# Patient Record
Sex: Female | Born: 1955 | Race: White | Hispanic: No | Smoking: Former smoker
Health system: Southern US, Community
[De-identification: ages and names within clinical notes are randomized; demographics above are authoritative.]

## PROBLEM LIST (undated history)

## (undated) DIAGNOSIS — I10 Essential (primary) hypertension: Secondary | ICD-10-CM

## (undated) DIAGNOSIS — K529 Noninfective gastroenteritis and colitis, unspecified: Secondary | ICD-10-CM

## (undated) DIAGNOSIS — E785 Hyperlipidemia, unspecified: Secondary | ICD-10-CM

## (undated) DIAGNOSIS — I639 Cerebral infarction, unspecified: Secondary | ICD-10-CM

## (undated) DIAGNOSIS — E079 Disorder of thyroid, unspecified: Secondary | ICD-10-CM

## (undated) HISTORY — DX: Disorder of thyroid, unspecified: E07.9

## (undated) HISTORY — DX: Essential (primary) hypertension: I10

## (undated) HISTORY — DX: Hyperlipidemia, unspecified: E78.5

## (undated) HISTORY — DX: Noninfective gastroenteritis and colitis, unspecified: K52.9

---

## 2002-05-23 ENCOUNTER — Other Ambulatory Visit: Admission: RE | Admit: 2002-05-23 | Discharge: 2002-05-23 | Payer: Self-pay | Admitting: Family Medicine

## 2004-03-25 ENCOUNTER — Other Ambulatory Visit: Admission: RE | Admit: 2004-03-25 | Discharge: 2004-03-25 | Payer: Self-pay | Admitting: Obstetrics and Gynecology

## 2005-03-25 ENCOUNTER — Other Ambulatory Visit: Admission: RE | Admit: 2005-03-25 | Discharge: 2005-03-25 | Payer: Self-pay | Admitting: Obstetrics and Gynecology

## 2006-04-08 ENCOUNTER — Other Ambulatory Visit: Admission: RE | Admit: 2006-04-08 | Discharge: 2006-04-08 | Payer: Self-pay | Admitting: Obstetrics and Gynecology

## 2006-05-03 ENCOUNTER — Encounter (INDEPENDENT_AMBULATORY_CARE_PROVIDER_SITE_OTHER): Payer: Self-pay | Admitting: *Deleted

## 2006-05-03 ENCOUNTER — Ambulatory Visit (HOSPITAL_BASED_OUTPATIENT_CLINIC_OR_DEPARTMENT_OTHER): Admission: RE | Admit: 2006-05-03 | Discharge: 2006-05-03 | Payer: Self-pay | Admitting: Obstetrics and Gynecology

## 2007-04-18 ENCOUNTER — Other Ambulatory Visit: Admission: RE | Admit: 2007-04-18 | Discharge: 2007-04-18 | Payer: Self-pay | Admitting: Obstetrics and Gynecology

## 2007-10-26 ENCOUNTER — Ambulatory Visit: Payer: Self-pay | Admitting: Obstetrics and Gynecology

## 2008-06-20 ENCOUNTER — Encounter: Payer: Self-pay | Admitting: Obstetrics and Gynecology

## 2008-06-20 ENCOUNTER — Other Ambulatory Visit: Admission: RE | Admit: 2008-06-20 | Discharge: 2008-06-20 | Payer: Self-pay | Admitting: Obstetrics and Gynecology

## 2008-06-20 ENCOUNTER — Ambulatory Visit: Payer: Self-pay | Admitting: Obstetrics and Gynecology

## 2009-06-25 ENCOUNTER — Ambulatory Visit: Payer: Self-pay | Admitting: Obstetrics and Gynecology

## 2009-06-25 ENCOUNTER — Other Ambulatory Visit: Admission: RE | Admit: 2009-06-25 | Discharge: 2009-06-25 | Payer: Self-pay | Admitting: Obstetrics and Gynecology

## 2010-06-20 NOTE — Op Note (Signed)
NAMEJAHZARIA, VARY             ACCOUNT NO.:  [id number]   MEDICAL RECORD NO.:  [id number]          PATIENT TYPE:  AMB   LOCATION:  [facility]                         FACILITY:  [facility]   PHYSICIAN:  Daniel L. Gottsegen, M.D.DATE OF BIRTH:  Apr 10, 1955   DATE OF PROCEDURE:  05/03/2006  DATE OF DISCHARGE:                               OPERATIVE REPORT   PREOPERATIVE DIAGNOSIS:  Vulvar lesion.   POSTOPERATIVE DIAGNOSIS:  Vulvar lesion.   OPERATIONS:  Excision of vulvar lesion.   SURGEON:  Dr. Eda Paschal.   ANESTHESIA:  General.   INDICATIONS:  Patient is a 55 year old female who had presented to the  office with a lesion of her vulvar that was swollen and uncomfortable  and painful.  It would get red.  There was no purulent drainage.  She  was treated twice with a full course of antibiotics.  She used warm  soaks.  She would get a partial response though it would never  completely disappear and she would have intermittent swelling from day-  to-day.  The lesion is approximately 1 x 3 cm.  She now enters for  excision of the above because of persistence of this now for 4 weeks in  spite of treatment.   FINDINGS:  On examination the patient had a raised lesion on her mons to  the right of the clitoris.  It is about 1 x 3 cm.  It is free of any  cellulitis today.  You can roll it between your fingers, it feels  somewhat fluctuant.   PROCEDURE:  After adequate general anesthesia, the patient was placed in  the supine position, prepped and draped in the usual sterile manner.  A  elliptical incision was made taking care to stay far away from the  lesion.  The lesion was elevated and excision was made making sure that  we got below the lesion into normal fatty tissue.  There was some oozing  that was controlled with a Bovie and then the skin incision was closed  with four interrupteds of 3-0 Vicryl, burying the knots.  Blood loss for  the entire procedure was minimal.  The patient left the  operating room  in satisfactory condition.  The specimen was sent to pathology.      Daniel L. Eda Paschal, M.D.  Electronically Signed     DLG/MEDQ  D:  05/03/2006  T:  05/03/2006  Job:  [id number]

## 2010-07-16 ENCOUNTER — Ambulatory Visit: Payer: Self-pay | Admitting: Obstetrics and Gynecology

## 2010-07-23 ENCOUNTER — Other Ambulatory Visit: Payer: Self-pay | Admitting: Obstetrics and Gynecology

## 2010-07-23 ENCOUNTER — Ambulatory Visit (INDEPENDENT_AMBULATORY_CARE_PROVIDER_SITE_OTHER): Payer: Self-pay | Admitting: Obstetrics and Gynecology

## 2010-07-23 DIAGNOSIS — N950 Postmenopausal bleeding: Secondary | ICD-10-CM

## 2010-08-15 ENCOUNTER — Other Ambulatory Visit: Payer: Self-pay | Admitting: Obstetrics and Gynecology

## 2010-09-01 ENCOUNTER — Other Ambulatory Visit: Payer: Self-pay | Admitting: Obstetrics and Gynecology

## 2010-09-03 ENCOUNTER — Other Ambulatory Visit: Payer: Self-pay | Admitting: Obstetrics and Gynecology

## 2010-09-05 ENCOUNTER — Other Ambulatory Visit: Payer: Self-pay

## 2010-09-05 MED ORDER — CITALOPRAM HYDROBROMIDE 20 MG PO TABS: 20.0000 mg | ORAL_TABLET | Freq: Every day | ORAL | Status: DC

## 2010-09-05 MED ORDER — ESTRADIOL 1 MG PO TABS: 1.0000 mg | ORAL_TABLET | Freq: Every day | ORAL | Status: DC

## 2010-09-05 NOTE — Telephone Encounter (Signed)
BOTH RX'S FAXED TO PHARMACY & PT NOTIFIED TO SET UP AEX.

## 2010-10-14 ENCOUNTER — Other Ambulatory Visit: Payer: Self-pay

## 2010-10-14 NOTE — Telephone Encounter (Signed)
REQUESTING REFILLS ON ESTRADIOL AND CELEXA AGAIN. SEE MULTIPLE ENCOUNTERS IN CHART REVIEW. STATES SHE WILL MAKE AN APPT. WITH [facility] BUT HAS NOT BEEN ABLE TO. STATES SHE STILL OWES [place] $.

## 2010-10-14 NOTE — Telephone Encounter (Signed)
Give her one more month. I don't think [facility] will see her for these problems. They just take care of contraception. She could go to the GYN clinic at [facility]

## 2010-10-15 MED ORDER — CITALOPRAM HYDROBROMIDE 20 MG PO TABS: 20.0000 mg | ORAL_TABLET | Freq: Every day | ORAL | Status: DC

## 2010-10-15 MED ORDER — ESTRADIOL 1 MG PO TABS: 1.0000 mg | ORAL_TABLET | Freq: Every day | ORAL | Status: DC

## 2010-10-15 NOTE — Telephone Encounter (Signed)
PT. CALLED BACK AND DOES WANT RXS TO GO TO THE PHARMACY LISTED ABOVE AND WILL LET us KNOW AT A LATER DATE ABOUT WOMENS GYN CLINIC. I SENT RXS TO HER PHARMACY.

## 2010-10-15 NOTE — Telephone Encounter (Signed)
NOTIFIED PT BY CELL # VOICEMAIL DR. G'S NOTE BELOW & CONFIRM RX'S TO [facility]OVE [facility].

## 2010-10-17 ENCOUNTER — Telehealth: Payer: Self-pay | Admitting: *Deleted

## 2010-10-17 MED ORDER — ESTROGENS CONJUGATED 0.625 MG PO TABS: 0.6250 mg | ORAL_TABLET | Freq: Every day | ORAL | Status: DC

## 2010-10-17 NOTE — Telephone Encounter (Signed)
Received fax from pharmacy that estradiol on back order. Dr Reece Agar changed it to Premarin 0.625mg  #30.

## 2012-04-02 DIAGNOSIS — I639 Cerebral infarction, unspecified: Secondary | ICD-10-CM

## 2012-04-02 HISTORY — DX: Cerebral infarction, unspecified: I63.9

## 2015-09-17 ENCOUNTER — Encounter (HOSPITAL_COMMUNITY): Payer: Self-pay | Admitting: Family Medicine

## 2015-09-17 ENCOUNTER — Ambulatory Visit (INDEPENDENT_AMBULATORY_CARE_PROVIDER_SITE_OTHER): Payer: Self-pay

## 2015-09-17 ENCOUNTER — Ambulatory Visit (HOSPITAL_COMMUNITY)
Admission: EM | Admit: 2015-09-17 | Discharge: 2015-09-17 | Disposition: A | Discharge status: Home / Self Care | Payer: Self-pay | Attending: Family Medicine | Admitting: Family Medicine

## 2015-09-17 DIAGNOSIS — J069 Acute upper respiratory infection, unspecified: Secondary | ICD-10-CM

## 2015-09-17 DIAGNOSIS — J189 Pneumonia, unspecified organism: Secondary | ICD-10-CM

## 2015-09-17 DIAGNOSIS — R918 Other nonspecific abnormal finding of lung field: Secondary | ICD-10-CM

## 2015-09-17 DIAGNOSIS — B9789 Other viral agents as the cause of diseases classified elsewhere: Secondary | ICD-10-CM

## 2015-09-17 DIAGNOSIS — J9801 Acute bronchospasm: Secondary | ICD-10-CM

## 2015-09-17 DIAGNOSIS — Z720 Tobacco use: Secondary | ICD-10-CM

## 2015-09-17 HISTORY — DX: Cerebral infarction, unspecified: I63.9

## 2015-09-17 MED ORDER — PREDNISONE 20 MG PO TABS: ORAL_TABLET | ORAL | 0 refills | Status: DC

## 2015-09-17 MED ORDER — ALBUTEROL SULFATE HFA 108 (90 BASE) MCG/ACT IN AERS: 2.0000 | INHALATION_SPRAY | RESPIRATORY_TRACT | 0 refills | Status: DC | PRN

## 2015-09-17 MED ORDER — CEFDINIR 300 MG PO CAPS: 300.0000 mg | ORAL_CAPSULE | Freq: Two times a day (BID) | ORAL | 0 refills | Status: DC

## 2015-09-17 MED ORDER — AZITHROMYCIN 250 MG PO TABS: ORAL_TABLET | ORAL | 0 refills | Status: DC

## 2015-09-17 NOTE — ED Provider Notes (Signed)
CSN: [id number]     Arrival date & time 09/17/15  1254 History   First MD Initiated Contact with Patient 09/17/15 1334     Chief Complaint  Patient presents with  . Cough  . Nasal Congestion  . Otalgia   (Consider location/radiation/quality/duration/timing/severity/associated sxs/prior Treatment) 60 year old female who appears older than stated age presents with a two-week history of cough, earaches and pressure, sore throat, PND and shortness of breath. She states a few days ago she had a fever that lasted about 3 days. It is undocumented. Her only medication has been Mucinex. She states thatsince she has been feeling bad she has not been smoking as much as usual. S1/2 PPD.      Past Medical History:  Diagnosis Date  . Stroke [facility])    History reviewed. No pertinent surgical history. History reviewed. No pertinent family history. Social History  Substance Use Topics  . Smoking status: Current Every Day Smoker  . Smokeless tobacco: Never Used  . Alcohol use Not on file   OB History    No data available     Review of Systems  Constitutional: Positive for activity change and fever. Negative for appetite change, chills and fatigue.  HENT: Positive for congestion, postnasal drip, rhinorrhea and sore throat. Negative for facial swelling and trouble swallowing.   Eyes: Negative.   Respiratory: Positive for cough and shortness of breath.   Cardiovascular: Negative.   Gastrointestinal: Negative.   Musculoskeletal: Negative for neck pain and neck stiffness.  Skin: Negative for pallor and rash.  Neurological: Negative.   All other systems reviewed and are negative.   Allergies  Review of patient's allergies indicates no known allergies.  Home Medications   Prior to Admission medications   Medication Sig Start Date End Date Taking? Authorizing Provider  albuterol (PROVENTIL HFA;VENTOLIN HFA) 108 (90 Base) MCG/ACT inhaler Inhale 2 puffs into the lungs every 4 (four) hours as  needed for wheezing or shortness of breath. 09/17/15   Janne Napoleon, NP  azithromycin (ZITHROMAX) 250 MG tablet 2 tabs po on day one, then one tablet po once daily on days 2-5. 09/17/15   Janne Napoleon, NP  cefdinir (OMNICEF) 300 MG capsule Take 1 capsule (300 mg total) by mouth 2 (two) times daily. 09/17/15   Janne Napoleon, NP  citalopram (CELEXA) 20 MG tablet Take 1 tablet (20 mg total) by mouth daily. 10/15/10 10/15/11  Bennetta Laos, MD  estradiol (ESTRACE) 1 MG tablet Take 1 tablet (1 mg total) by mouth daily. 10/15/10 10/15/11  Bennetta Laos, MD  estrogens, conjugated, (PREMARIN) 0.625 MG tablet Take 1 tablet (0.625 mg total) by mouth daily. Take daily for 21 days then do not take for 7 days. 10/17/10 10/17/11  Bennetta Laos, MD  predniSONE (DELTASONE) 20 MG tablet Take 3 tabs po on first day, 2 tabs second day, 2 tabs third day, 1 tab fourth day, 1 tab 5th day. Take with food. 09/17/15   Janne Napoleon, NP   Meds Ordered and Administered this Visit  Medications - No data to display  BP 129/75 (BP Location: Left Arm)   Pulse 78   Temp 97.8 F (36.6 C) (Oral)   Resp 16   SpO2 95%  No data found.   Physical Exam  Constitutional: She is oriented to person, place, and time. She appears well-developed and well-nourished. No distress.  HENT:  Head: Normocephalic and atraumatic.  Most of the right TM obstructed by cerumen. Portion seen is pearly gray and  transparent. Left TM with minor retraction otherwise normal.  Oropharynx with light erythema and clear PND. No exudates or swelling.  Eyes: EOM are normal.  Neck: Normal range of motion. Neck supple.  Cardiovascular: Normal rate, regular rhythm and normal heart sounds.   Pulmonary/Chest: Effort normal. No respiratory distress.  Few scattered wheezes and crackles bilaterally. Breath sounds diminished throughout. Mildly prolonged expiratory phase and poor chest expansion.  Musculoskeletal: Normal range of motion. She exhibits no edema.   Lymphadenopathy:    She has no cervical adenopathy.  Neurological: She is alert and oriented to person, place, and time.  Skin: Skin is warm and dry. No rash noted.  Psychiatric: She has a normal mood and affect.  Nursing note and vitals reviewed.   Urgent Care Course   Clinical Course    Procedures (including critical care time)  Labs Review Labs Reviewed - No data to display  Imaging Review Dg Chest 2 View  Result Date: 09/17/2015 CLINICAL DATA:  Cough for 2 weeks with shortness of Breath EXAM: CHEST  2 VIEW COMPARISON:  None. FINDINGS: Cardiac shadow is within normal limits. The lungs are hyperinflated. Right lower lobe infiltrate is noted. No bony abnormality is seen. IMPRESSION: Right lower lobe infiltrate. Electronically Signed   By: Inez Catalina M.D.   On: 09/17/2015 14:40     Visual Acuity Review  Right Eye Distance:   Left Eye Distance:   Bilateral Distance:    Right Eye Near:   Left Eye Near:    Bilateral Near:         MDM   1. Pulmonary infiltrate in right lung on chest x-ray   2. CAP (community acquired pneumonia)   3. Bronchospasm   4. Viral URI with cough   5. Tobacco abuse disorder    Meds ordered this encounter  Medications  . albuterol (PROVENTIL HFA;VENTOLIN HFA) 108 (90 Base) MCG/ACT inhaler    Sig: Inhale 2 puffs into the lungs every 4 (four) hours as needed for wheezing or shortness of breath.    Dispense:  1 Inhaler    Refill:  0    Order Specific Question:   Supervising Provider    Answer:   Ihor Gully D [id number]  . predniSONE (DELTASONE) 20 MG tablet    Sig: Take 3 tabs po on first day, 2 tabs second day, 2 tabs third day, 1 tab fourth day, 1 tab 5th day. Take with food.    Dispense:  9 tablet    Refill:  0    Order Specific Question:   Supervising Provider    Answer:   Billy Fischer [phone]  . azithromycin (ZITHROMAX) 250 MG tablet    Sig: 2 tabs po on day one, then one tablet po once daily on days 2-5.    Dispense:  6 tablet     Refill:  0    Order Specific Question:   Supervising Provider    Answer:   Billy Fischer [phone]  . cefdinir (OMNICEF) 300 MG capsule    Sig: Take 1 capsule (300 mg total) by mouth 2 (two) times daily.    Dispense:  16 capsule    Refill:  0    Order Specific Question:   Supervising Provider    Answer:   Billy Fischer [id number]   Stop smoking.  USe Zyrtec or Allegra for drainage, Sudafed PE 10 mg for congestion. Lots of nasal saline for nasal congestion. Lots of fluids.    Janne Napoleon, NP  09/17/15 1506  

## 2015-09-17 NOTE — ED Triage Notes (Signed)
Pt here for URI symptoms x 2 weeks.  

## 2016-01-17 ENCOUNTER — Ambulatory Visit: Payer: Self-pay | Admitting: Nurse Practitioner

## 2016-02-21 ENCOUNTER — Ambulatory Visit (INDEPENDENT_AMBULATORY_CARE_PROVIDER_SITE_OTHER): Payer: Medicare Other | Admitting: Nurse Practitioner

## 2016-02-21 ENCOUNTER — Encounter: Payer: Self-pay | Admitting: Nurse Practitioner

## 2016-02-21 VITALS — BP 188/110 | HR 72 | Temp 97.6°F | Ht 65.0 in | Wt 99.8 lb

## 2016-02-21 DIAGNOSIS — H9190 Unspecified hearing loss, unspecified ear: Secondary | ICD-10-CM | POA: Insufficient documentation

## 2016-02-21 DIAGNOSIS — F3342 Major depressive disorder, recurrent, in full remission: Secondary | ICD-10-CM

## 2016-02-21 DIAGNOSIS — I69319 Unspecified symptoms and signs involving cognitive functions following cerebral infarction: Secondary | ICD-10-CM | POA: Insufficient documentation

## 2016-02-21 DIAGNOSIS — I10 Essential (primary) hypertension: Secondary | ICD-10-CM | POA: Insufficient documentation

## 2016-02-21 DIAGNOSIS — G4700 Insomnia, unspecified: Secondary | ICD-10-CM | POA: Diagnosis not present

## 2016-02-21 MED ORDER — METOPROLOL TARTRATE 25 MG PO TABS: 25.0000 mg | ORAL_TABLET | Freq: Two times a day (BID) | ORAL | 3 refills | Status: DC

## 2016-02-21 MED ORDER — ESCITALOPRAM OXALATE 10 MG PO TABS: 10.0000 mg | ORAL_TABLET | Freq: Every day | ORAL | 3 refills | Status: DC

## 2016-02-21 MED ORDER — TRAZODONE HCL 100 MG PO TABS: 100.0000 mg | ORAL_TABLET | Freq: Every day | ORAL | 3 refills | Status: DC

## 2016-02-21 NOTE — Progress Notes (Signed)
Pre visit review using our clinic review tool, if applicable. No additional management support is needed unless otherwise documented below in the visit note. 

## 2016-02-21 NOTE — Assessment & Plan Note (Addendum)
Patient is unsure of type. 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

## 2016-02-21 NOTE — Progress Notes (Signed)
Subjective:    Patient ID: Stacie [name], female    DOB: Mar 19, 1955, 61 y.o.   MRN: [id number]  Patient presents today for complete physical or establish care (new patient) and medication refill.  Hypertension  This is a chronic problem. The current episode started more than 1 year ago. The problem is uncontrolled. Associated symptoms include anxiety. Pertinent negatives include no blurred vision, chest pain, headaches, malaise/fatigue, neck pain, orthopnea, palpitations, peripheral edema, PND, shortness of breath or sweats. There are no associated agents to hypertension. Risk factors for coronary artery disease include dyslipidemia, family history, post-menopausal state and sedentary lifestyle. Past treatments include beta blockers. Compliance problems: out of medication for 3weeks.  Hypertensive end-organ damage includes CVA. There is no history of angina, kidney disease, CAD/MI, heart failure, left ventricular hypertrophy or PVD.  Depression       The patient presents with depression.  This is a chronic problem.  The current episode started more than 1 year ago.   Progression since onset: stable mood with use of lexapro.  Associated symptoms include no headaches.     Exacerbated by: health concerns.  Past treatments include SSRIs - Selective serotonin reuptake inhibitors and other medications.  Compliance with treatment is good.  Previous treatment provided significant relief.  Risk factors include alcohol intake and illicit drug use (daily use of marijuana).   Past medical history includes anxiety and depression.     Pertinent negatives include no suicide attempts.   Moved to [place] from [place], 07/2015.   has been out of medications for 3weeks.  She also reports hx of colitis. Diagnosed 2016. Led to significant weight loss. She used marijuana to increase appetite and regain weight. Unable to remember type of colitis.  Immunizations: (TDAP, Hep C screen, Pneumovax, Influenza, zoster)  Health  Maintenance  Topic Date Due  .  Hepatitis C: One time screening is recommended by Center for Disease Control  (CDC) for  adults born from [birth year] through 1965.   05/13/1955  . HIV Screening  06/10/1970  . Tetanus Vaccine  06/10/1974  . Pap Smear  06/09/1976  . Mammogram  06/09/2005  . Colon Cancer Screening  06/09/2005  . Shingles Vaccine  06/10/2015  . Flu Shot  05/02/2016*  *Topic was postponed. The date shown is not the original due date.   Diet:regular Weight:  Wt Readings from Last 3 Encounters:  02/21/16 99 lb 12 oz (45.2 kg)   Exercise:none Fall Risk:no fall in last 1year No flowsheet data found. Home Safety:home alone Depression/Suicide: been on lexapro for several year. No flowsheet data found. No flowsheet data found. Colonoscopy (every 5-58yrs, >50-51yrs):needed Dexa (every 2-42yrs, >53yrs):needed Pap Smear (every 39yrs for >21-29 without HPV, every 1yrs for >30-79yrs with HPV):55yrs ago, normal per patient Mammogram (yearly, >83yrs):needed Vision:last done 78yrs ago, needs corrective Dental: up to date Advanced Directive:patient will obtain No flowsheet data found. Sexual History (birth control, marital status, STD):single, on disability, home alone, not sexually active , 2adult daughters, 1 grand child  Medications and allergies reviewed with patient and updated if appropriate.  Patient Active Problem List   Diagnosis Date Noted  . Essential hypertension 02/21/2016  . 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 Hearing loss 02/21/2016    No current outpatient prescriptions on file prior to visit.   No current facility-administered medications on file prior to visit.     Past Medical History:  Diagnosis Date  . Colitis   . Hypertension   . Stroke (  [place]) 04/2012   unknown type    History reviewed. No pertinent surgical history.  Social History   Social History  . Marital status: Single    Spouse name: N/A  . Number of  children: N/A  . Years of education: N/A   Social History Main Topics  . Smoking status: Current Every Day Smoker  . Smokeless tobacco: Never Used  . Alcohol use 8.4 oz/week    14 Cans of beer per week  . Drug use: Yes    Frequency: 7.0 times per week    Types: Marijuana     Comment: to help with colitis  . Sexual activity: Not Currently   Other Topics Concern  . None   Social History Narrative  . None    Family History  Problem Relation Age of Onset  . Stroke Mother 27    embolic  . Heart disease Father 61    CAd with CABG, no MI  . Heart disease Sister 32    CAD  . Heart disease Paternal Uncle   . Heart disease Maternal Grandmother   . Heart disease Paternal Grandfather         Review of Systems  Constitutional: Negative for malaise/fatigue.  Eyes: Negative for blurred vision.  Respiratory: Negative for shortness of breath.   Cardiovascular: Negative for chest pain, palpitations, orthopnea and PND.  Musculoskeletal: Negative for neck pain.  Neurological: Negative for headaches.  Psychiatric/Behavioral: Positive for depression.    Objective:   Vitals:   02/21/16 1412  BP: (!) 188/110  Pulse: 72  Temp: 97.6 F (36.4 C)    Body mass index is 16.6 kg/m.   Physical Examination:  Physical Exam  Constitutional: She is oriented to person, place, and time and well-developed, well-nourished, and in no distress. No distress.  HENT:  Right Ear: External ear normal.  Left Ear: External ear normal.  Nose: Nose normal.  Mouth/Throat: Oropharynx is clear and moist. No oropharyngeal exudate.  Eyes: Conjunctivae and EOM are normal. Pupils are equal, round, and reactive to light. No scleral icterus.  Neck: Normal range of motion. Neck supple. No thyromegaly present.  Cardiovascular: Normal rate, regular rhythm, normal heart sounds and intact distal pulses.   Pulmonary/Chest: Effort normal and breath sounds normal. She exhibits no tenderness.  Abdominal: Soft.  Bowel sounds are normal. She exhibits no distension. There is no tenderness.  Musculoskeletal: Normal range of motion. She exhibits no edema or tenderness.  Lymphadenopathy:    She has no cervical adenopathy.  Neurological: She is alert and oriented to person, place, and time. Gait normal.  Skin: Skin is warm and dry.  Psychiatric: Affect and judgment normal.  Vitals reviewed.   ASSESSMENT and PLAN:  Thora was seen today for establish care and colon cancer screening.  Diagnoses and all orders for this visit:  Essential hypertension -     metoprolol tartrate (LOPRESSOR) 25 MG tablet; Take 1 tablet (25 mg total) by mouth 2 (two) times daily.  Recurrent major depressive disorder, in full remission ([place]) -     escitalopram (LEXAPRO) 10 MG tablet; Take 1 tablet (10 mg total) by mouth daily.  Insomnia, unspecified type -     traZODone (DESYREL) 100 MG tablet; Take 1 tablet (100 mg total) by mouth at bedtime.    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      Follow up:  Return in about 3 months (around 05/21/2016) for HTN and CPE (fasting).  Wilfred Lacy, NP

## 2016-02-21 NOTE — Patient Instructions (Addendum)
Call office if BP is persistently greater than 150/90 for the next 3days.  Check BP once a day in morning and record.  Sign medical release to obtain records from previous pcp and  Cardiologist.  Hypertension Hypertension is another name for high blood pressure. High blood pressure forces your heart to work harder to pump blood. A blood pressure reading has two numbers, which includes a higher number over a lower number (example: 110/72). Follow these instructions at home:  Have your blood pressure rechecked by your doctor.  Only take medicine as told by your doctor. Follow the directions carefully. The medicine does not work as well if you skip doses. Skipping doses also puts you at risk for problems.  Do not smoke.  Monitor your blood pressure at home as told by your doctor. Contact a doctor if:  You think you are having a reaction to the medicine you are taking.  You have repeat headaches or feel dizzy.  You have puffiness (swelling) in your ankles.  You have trouble with your vision. Get help right away if:  You get a very bad headache and are confused.  You feel weak, numb, or faint.  You get chest or belly (abdominal) pain.  You throw up (vomit).  You cannot breathe very well. This information is not intended to replace advice given to you by your health care provider. Make sure you discuss any questions you have with your health care provider. Document Released: 07/08/2007 Document Revised: 06/27/2015 Document Reviewed: 11/11/2012 Elsevier Interactive Patient Education  2017 [employer].

## 2016-02-28 ENCOUNTER — Ambulatory Visit (INDEPENDENT_AMBULATORY_CARE_PROVIDER_SITE_OTHER): Payer: Medicare Other | Admitting: Nurse Practitioner

## 2016-02-28 ENCOUNTER — Other Ambulatory Visit (INDEPENDENT_AMBULATORY_CARE_PROVIDER_SITE_OTHER): Payer: Medicare Other

## 2016-02-28 ENCOUNTER — Encounter: Payer: Self-pay | Admitting: Nurse Practitioner

## 2016-02-28 VITALS — BP 164/120 | HR 54 | Ht 65.0 in | Wt 99.5 lb

## 2016-02-28 DIAGNOSIS — I10 Essential (primary) hypertension: Secondary | ICD-10-CM

## 2016-02-28 DIAGNOSIS — E785 Hyperlipidemia, unspecified: Secondary | ICD-10-CM

## 2016-02-28 LAB — LIPID PANEL
Cholesterol: 220 mg/dL — ABNORMAL HIGH (ref 0–200)
HDL: 48.9 mg/dL (ref >39.00)
LDL Cholesterol: 151 mg/dL — ABNORMAL HIGH (ref 0–99)
NONHDL: 170.72
Total CHOL/HDL Ratio: 4
Triglycerides: 98 mg/dL (ref 0.0–149.0)
VLDL: 19.6 mg/dL (ref 0.0–40.0)

## 2016-02-28 LAB — COMPREHENSIVE METABOLIC PANEL
ALK PHOS: 58 U/L (ref 39–117)
ALT: 12 U/L (ref 0–35)
AST: 17 U/L (ref 0–37)
Albumin: 4.4 g/dL (ref 3.5–5.2)
BILIRUBIN TOTAL: 0.3 mg/dL (ref 0.2–1.2)
BUN: 21 mg/dL (ref 6–23)
CO2: 31 mEq/L (ref 19–32)
CREATININE: 0.81 mg/dL (ref 0.40–1.20)
Calcium: 9.5 mg/dL (ref 8.4–10.5)
Chloride: 105 mEq/L (ref 96–112)
GFR: 76.47 mL/min (ref >60.00)
GLUCOSE: 90 mg/dL (ref 70–99)
Potassium: 4.2 mEq/L (ref 3.5–5.1)
Sodium: 141 mEq/L (ref 135–145)
TOTAL PROTEIN: 7.1 g/dL (ref 6.0–8.3)

## 2016-02-28 MED ORDER — AMLODIPINE BESYLATE 10 MG PO TABS
10.0000 mg | ORAL_TABLET | Freq: Every day | ORAL | 3 refills | Status: DC
Start: 2016-02-28 — End: 2017-05-03

## 2016-02-28 NOTE — Progress Notes (Signed)
Pre visit review using our clinic review tool, if applicable. No additional management support is needed unless otherwise documented below in the visit note. 

## 2016-02-28 NOTE — Patient Instructions (Signed)
Hypertension Hypertension is another name for high blood pressure. High blood pressure forces your heart to work harder to pump blood. A blood pressure reading has two numbers, which includes a higher number over a lower number (example: 110/72). Follow these instructions at home:  Have your blood pressure rechecked by your doctor.  Only take medicine as told by your doctor. Follow the directions carefully. The medicine does not work as well if you skip doses. Skipping doses also puts you at risk for problems.  Do not smoke.  Monitor your blood pressure at home as told by your doctor. Contact a doctor if:  You think you are having a reaction to the medicine you are taking.  You have repeat headaches or feel dizzy.  You have puffiness (swelling) in your ankles.  You have trouble with your vision. Get help right away if:  You get a very bad headache and are confused.  You feel weak, numb, or faint.  You get chest or belly (abdominal) pain.  You throw up (vomit).  You cannot breathe very well. This information is not intended to replace advice given to you by your health care provider. Make sure you discuss any questions you have with your health care provider. Document Released: 07/08/2007 Document Revised: 06/27/2015 Document Reviewed: 11/11/2012 Elsevier Interactive Patient Education  2017 [employer]vier Inc.  

## 2016-02-28 NOTE — Progress Notes (Signed)
Subjective:  Patient ID: Stacie [name], female    DOB: [birth year]  Age: 61 y.o. MRN: [id number]  CC: Hypertension   Hypertension  This is a chronic problem. The current episode started more than 1 year ago. The problem has been gradually worsening since onset. The problem is uncontrolled. Pertinent negatives include no anxiety, blurred vision, chest pain, headaches, malaise/fatigue, neck pain, orthopnea, palpitations, peripheral edema, PND, shortness of breath or sweats. There are no associated agents to hypertension. Risk factors for coronary artery disease include dyslipidemia, family history, post-menopausal state and sedentary lifestyle. Past treatments include beta blockers. The current treatment provides no improvement. There are no compliance problems.  Hypertensive end-organ damage includes CVA. There is no history of a hypertension causing med or a thyroid problem.    Outpatient Medications Prior to Visit  Medication Sig Dispense Refill  . escitalopram (LEXAPRO) 10 MG tablet Take 1 tablet (10 mg total) by mouth daily. 30 tablet 3  . metoprolol tartrate (LOPRESSOR) 25 MG tablet Take 1 tablet (25 mg total) by mouth 2 (two) times daily. 60 tablet 3  . traZODone (DESYREL) 100 MG tablet Take 1 tablet (100 mg total) by mouth at bedtime. 30 tablet 3   No facility-administered medications prior to visit.     ROS See HPI  Objective:  BP (!) 164/120 (BP Location: Right Arm, Patient Position: Sitting, Cuff Size: Small)   Pulse (!) 54   Ht 5\' 5"  (1.651 m)   Wt 99 lb 8 oz (45.1 kg)   SpO2 98%   BMI 16.56 kg/m   BP Readings from Last 3 Encounters:  02/28/16 (!) 164/120  02/21/16 (!) 188/110  09/17/15 129/75    Wt Readings from Last 3 Encounters:  02/28/16 99 lb 8 oz (45.1 kg)  02/21/16 99 lb 12 oz (45.2 kg)    Physical Exam  Constitutional: She is oriented to person, place, and time. No distress.  HENT:  Nose: Nose normal.  Neck: Normal range of motion. Neck supple.    Cardiovascular: Normal rate, regular rhythm and normal heart sounds.   Pulmonary/Chest: Effort normal and breath sounds normal. No respiratory distress.  Musculoskeletal: Normal range of motion. She exhibits no edema.  Neurological: She is alert and oriented to person, place, and time.  Skin: Skin is warm and dry.  Psychiatric: She has a normal mood and affect. Her behavior is normal.    No results found for: WBC, HGB, HCT, PLT, GLUCOSE, CHOL, TRIG, HDL, LDLDIRECT, LDLCALC, ALT, AST, NA, K, CL, CREATININE, BUN, CO2, TSH, PSA, INR, GLUF, HGBA1C, MICROALBUR  Dg Chest 2 View  Result Date: 09/17/2015 CLINICAL DATA:  Cough for 2 weeks with shortness of Breath EXAM: CHEST  2 VIEW COMPARISON:  None. FINDINGS: Cardiac shadow is within normal limits. The lungs are hyperinflated. Right lower lobe infiltrate is noted. No bony abnormality is seen. IMPRESSION: Right lower lobe infiltrate. Electronically Signed   By: Inez Catalina M.D.   On: 09/17/2015 14:40    Assessment & Plan:   Yaresly was seen today for hypertension.  Diagnoses and all orders for this visit:  Essential hypertension -     Comprehensive metabolic panel; Future -     amLODipine (NORVASC) 10 MG tablet; Take 1 tablet (10 mg total) by mouth daily.  Hyperlipidemia, unspecified hyperlipidemia type -     Comprehensive metabolic panel; Future -     Lipid panel; Future   I am having Ms. Rookstool start on amLODipine. I am also having her maintain  her escitalopram, metoprolol tartrate, and traZODone.  Meds ordered this encounter  Medications  . amLODipine (NORVASC) 10 MG tablet    Sig: Take 1 tablet (10 mg total) by mouth daily.    Dispense:  90 tablet    Refill:  3    Order Specific Question:   Supervising Provider    Answer:   Cassandria Anger [[id number]]    Follow-up: Return in about 1 week (around 03/06/2016) for HTN re eval..  Wilfred Lacy, NP

## 2016-03-03 MED ORDER — PRAVASTATIN SODIUM 40 MG PO TABS: 40.0000 mg | ORAL_TABLET | Freq: Every day | ORAL | 3 refills | Status: DC

## 2016-03-06 ENCOUNTER — Ambulatory Visit: Payer: Medicare Other | Admitting: Nurse Practitioner

## 2016-03-10 ENCOUNTER — Encounter: Payer: Self-pay | Admitting: Nurse Practitioner

## 2016-03-10 ENCOUNTER — Ambulatory Visit (INDEPENDENT_AMBULATORY_CARE_PROVIDER_SITE_OTHER): Payer: Medicare Other | Admitting: Nurse Practitioner

## 2016-03-10 VITALS — BP 134/80 | HR 54 | Temp 97.5°F | Ht 65.0 in | Wt 99.0 lb

## 2016-03-10 DIAGNOSIS — I10 Essential (primary) hypertension: Secondary | ICD-10-CM

## 2016-03-10 DIAGNOSIS — E785 Hyperlipidemia, unspecified: Secondary | ICD-10-CM | POA: Diagnosis not present

## 2016-03-10 NOTE — Patient Instructions (Signed)
Maintain current medications. Follow up in April for CPE as scheduled.

## 2016-03-10 NOTE — Progress Notes (Signed)
Pre visit review using our clinic review tool, if applicable. No additional management support is needed unless otherwise documented below in the visit note. 

## 2016-03-10 NOTE — Progress Notes (Signed)
Subjective:  Patient ID: Stacie [name], female    DOB: Feb 21, 1955  Age: 61 y.o. MRN: [id number]  CC: Follow-up (follow up/)   HPI  HTN: Denies any headache or CP or palpitation or SOB or fatigue or edema.  Hyperlipidemia. Denies any myalgia or joint pain.  Outpatient Medications Prior to Visit  Medication Sig Dispense Refill  . amLODipine (NORVASC) 10 MG tablet Take 1 tablet (10 mg total) by mouth daily. 90 tablet 3  . escitalopram (LEXAPRO) 10 MG tablet Take 1 tablet (10 mg total) by mouth daily. 30 tablet 3  . metoprolol tartrate (LOPRESSOR) 25 MG tablet Take 1 tablet (25 mg total) by mouth 2 (two) times daily. 60 tablet 3  . traZODone (DESYREL) 100 MG tablet Take 1 tablet (100 mg total) by mouth at bedtime. 30 tablet 3  . pravastatin (PRAVACHOL) 40 MG tablet Take 1 tablet (40 mg total) by mouth daily. (Patient not taking: Reported on 03/10/2016) 30 tablet 3   No facility-administered medications prior to visit.     ROS See HPI  Objective:  BP 134/80   Pulse (!) 54   Temp 97.5 F (36.4 C)   Ht 5\' 5"  (1.651 m)   Wt 99 lb (44.9 kg)   SpO2 98%   BMI 16.47 kg/m   BP Readings from Last 3 Encounters:  03/10/16 134/80  02/28/16 (!) 164/120  02/21/16 (!) 188/110    Wt Readings from Last 3 Encounters:  03/10/16 99 lb (44.9 kg)  02/28/16 99 lb 8 oz (45.1 kg)  02/21/16 99 lb 12 oz (45.2 kg)    Physical Exam  Constitutional: She is oriented to person, place, and time. No distress.  HENT:  Right Ear: External ear normal.  Left Ear: External ear normal.  Nose: Nose normal.  Mouth/Throat: No oropharyngeal exudate.  Eyes: No scleral icterus.  Neck: Normal range of motion. Neck supple.  Cardiovascular: Normal rate, regular rhythm and normal heart sounds.   Pulmonary/Chest: Effort normal and breath sounds normal. No respiratory distress.  Abdominal: Soft. She exhibits no distension.  Musculoskeletal: Normal range of motion. She exhibits no edema.  Lymphadenopathy:   She has no cervical adenopathy.  Neurological: She is alert and oriented to person, place, and time.  Skin: Skin is warm and dry.  Psychiatric: She has a normal mood and affect. Her behavior is normal.    Lab Results  Component Value Date   GLUCOSE 90 02/28/2016   CHOL 220 (H) 02/28/2016   TRIG 98.0 02/28/2016   HDL 48.90 02/28/2016   LDLCALC 151 (H) 02/28/2016   ALT 12 02/28/2016   AST 17 02/28/2016   NA 141 02/28/2016   K 4.2 02/28/2016   CL 105 02/28/2016   CREATININE 0.81 02/28/2016   BUN 21 02/28/2016   CO2 31 02/28/2016    Dg Chest 2 View  Result Date: 09/17/2015 CLINICAL DATA:  Cough for 2 weeks with shortness of Breath EXAM: CHEST  2 VIEW COMPARISON:  None. FINDINGS: Cardiac shadow is within normal limits. The lungs are hyperinflated. Right lower lobe infiltrate is noted. No bony abnormality is seen. IMPRESSION: Right lower lobe infiltrate. Electronically Signed   By: Inez Catalina M.D.   On: 09/17/2015 14:40    Assessment & Plan:   Stacie [name] was seen today for follow-up.  Diagnoses and all orders for this visit:  Essential hypertension  Hyperlipidemia, unspecified hyperlipidemia type   I am having [name] maintain her escitalopram, metoprolol tartrate, traZODone, amLODipine, and pravastatin.  No  orders of the defined types were placed in this encounter.   Follow-up: Return if symptoms worsen or fail to improve.  Wilfred Lacy, NP

## 2016-05-19 ENCOUNTER — Telehealth: Payer: Self-pay | Admitting: Nurse Practitioner

## 2016-05-19 NOTE — Telephone Encounter (Signed)
ROI faxed to [facility] and refaxed 05/19/2016. [facility]

## 2016-05-21 ENCOUNTER — Ambulatory Visit (INDEPENDENT_AMBULATORY_CARE_PROVIDER_SITE_OTHER): Payer: Medicare Other | Admitting: Nurse Practitioner

## 2016-05-21 ENCOUNTER — Other Ambulatory Visit: Payer: Medicare Other

## 2016-05-21 ENCOUNTER — Other Ambulatory Visit (HOSPITAL_COMMUNITY)
Admission: RE | Admit: 2016-05-21 | Discharge: 2016-05-21 | Disposition: A | Discharge status: Home / Self Care | Payer: Medicare Other | Source: Ambulatory Visit | Attending: Nurse Practitioner | Admitting: Nurse Practitioner

## 2016-05-21 ENCOUNTER — Encounter: Payer: Self-pay | Admitting: Nurse Practitioner

## 2016-05-21 ENCOUNTER — Other Ambulatory Visit (INDEPENDENT_AMBULATORY_CARE_PROVIDER_SITE_OTHER): Payer: Medicare Other

## 2016-05-21 VITALS — BP 132/88 | HR 55 | Temp 97.5°F | Ht 65.0 in | Wt 104.0 lb

## 2016-05-21 DIAGNOSIS — R21 Rash and other nonspecific skin eruption: Secondary | ICD-10-CM | POA: Diagnosis not present

## 2016-05-21 DIAGNOSIS — Z124 Encounter for screening for malignant neoplasm of cervix: Secondary | ICD-10-CM | POA: Insufficient documentation

## 2016-05-21 DIAGNOSIS — Z0000 Encounter for general adult medical examination without abnormal findings: Secondary | ICD-10-CM

## 2016-05-21 DIAGNOSIS — E785 Hyperlipidemia, unspecified: Secondary | ICD-10-CM

## 2016-05-21 DIAGNOSIS — Z1231 Encounter for screening mammogram for malignant neoplasm of breast: Secondary | ICD-10-CM | POA: Diagnosis not present

## 2016-05-21 DIAGNOSIS — Z1159 Encounter for screening for other viral diseases: Secondary | ICD-10-CM | POA: Diagnosis not present

## 2016-05-21 DIAGNOSIS — F3342 Major depressive disorder, recurrent, in full remission: Secondary | ICD-10-CM | POA: Diagnosis not present

## 2016-05-21 DIAGNOSIS — G4700 Insomnia, unspecified: Secondary | ICD-10-CM

## 2016-05-21 DIAGNOSIS — I10 Essential (primary) hypertension: Secondary | ICD-10-CM

## 2016-05-21 LAB — LIPID PANEL
CHOLESTEROL: 266 mg/dL — AB (ref 0–200)
HDL: 59.2 mg/dL (ref >39.00)
LDL CALC: 169 mg/dL — AB (ref 0–99)
NonHDL: 207.03
Total CHOL/HDL Ratio: 4
Triglycerides: 191 mg/dL — ABNORMAL HIGH (ref 0.0–149.0)
VLDL: 38.2 mg/dL (ref 0.0–40.0)

## 2016-05-21 LAB — CBC
HEMATOCRIT: 38.8 % (ref 36.0–46.0)
Hemoglobin: 12.8 g/dL (ref 12.0–15.0)
MCHC: 32.9 g/dL (ref 30.0–36.0)
MCV: 91.2 fl (ref 78.0–100.0)
Platelets: 300 10*3/uL (ref 150.0–400.0)
RBC: 4.25 Mil/uL (ref 3.87–5.11)
RDW: 14.5 % (ref 11.5–15.5)
WBC: 7.8 10*3/uL (ref 4.0–10.5)

## 2016-05-21 LAB — TSH: TSH: 4.7 u[IU]/mL — AB (ref 0.35–4.50)

## 2016-05-21 MED ORDER — METOPROLOL TARTRATE 25 MG PO TABS: 25.0000 mg | ORAL_TABLET | Freq: Two times a day (BID) | ORAL | 2 refills | Status: DC

## 2016-05-21 MED ORDER — PRAVASTATIN SODIUM 40 MG PO TABS: 40.0000 mg | ORAL_TABLET | Freq: Every day | ORAL | 2 refills | Status: DC

## 2016-05-21 MED ORDER — TRAZODONE HCL 100 MG PO TABS: 100.0000 mg | ORAL_TABLET | Freq: Every day | ORAL | 2 refills | Status: DC

## 2016-05-21 MED ORDER — ESCITALOPRAM OXALATE 10 MG PO TABS: 10.0000 mg | ORAL_TABLET | Freq: Every day | ORAL | 2 refills | Status: DC

## 2016-05-21 NOTE — Patient Instructions (Signed)
You will be called to schedule dermatology, mammogram and colonoscopy appointments.  Go to lab for blood draw.  Medication refills have been sent.  Health Maintenance, Female Adopting a healthy lifestyle and getting preventive care can go a long way to promote health and wellness. Talk with your health care provider about what schedule of regular examinations is right for you. This is a good chance for you to check in with your provider about disease prevention and staying healthy. In between checkups, there are plenty of things you can do on your own. Experts have done a lot of research about which lifestyle changes and preventive measures are most likely to keep you healthy. Ask your health care provider for more information. Weight and diet Eat a healthy diet  Be sure to include plenty of vegetables, fruits, low-fat dairy products, and lean protein.  Do not eat a lot of foods high in solid fats, added sugars, or salt.  Get regular exercise. This is one of the most important things you can do for your health.  Most adults should exercise for at least 150 minutes each week. The exercise should increase your heart rate and make you sweat (moderate-intensity exercise).  Most adults should also do strengthening exercises at least twice a week. This is in addition to the moderate-intensity exercise. Maintain a healthy weight  Body mass index (BMI) is a measurement that can be used to identify possible weight problems. It estimates body fat based on height and weight. Your health care provider can help determine your BMI and help you achieve or maintain a healthy weight.  For females 66 years of age and older:  A BMI below 18.5 is considered underweight.  A BMI of 18.5 to 24.9 is normal.  A BMI of 25 to 29.9 is considered overweight.  A BMI of 30 and above is considered obese. Watch levels of cholesterol and blood lipids  You should start having your blood tested for lipids and  cholesterol at 61 years of age, then have this test every 5 years.  You may need to have your cholesterol levels checked more often if:  Your lipid or cholesterol levels are high.  You are older than 61 years of age.  You are at high risk for heart disease. Cancer screening Lung Cancer  Lung cancer screening is recommended for adults 37-21 years old who are at high risk for lung cancer because of a history of smoking.  A yearly low-dose CT scan of the lungs is recommended for people who:  Currently smoke.  Have quit within the past 15 years.  Have at least a 30-pack-year history of smoking. A pack year is smoking an average of one pack of cigarettes a day for 1 year.  Yearly screening should continue until it has been 15 years since you quit.  Yearly screening should stop if you develop a health problem that would prevent you from having lung cancer treatment. Breast Cancer  Practice breast self-awareness. This means understanding how your breasts normally appear and feel.  It also means doing regular breast self-exams. Let your health care provider know about any changes, no matter how small.  If you are in your 20s or 30s, you should have a clinical breast exam (CBE) by a health care provider every 1-3 years as part of a regular health exam.  If you are 61 or older, have a CBE every year. Also consider having a breast X-ray (mammogram) every year.  If you have a  family history of breast cancer, talk to your health care provider about genetic screening.  If you are at high risk for breast cancer, talk to your health care provider about having an MRI and a mammogram every year.  Breast cancer gene (BRCA) assessment is recommended for women who have family members with BRCA-related cancers. BRCA-related cancers include:  Breast.  Ovarian.  Tubal.  Peritoneal cancers.  Results of the assessment will determine the need for genetic counseling and BRCA1 and BRCA2  testing. Cervical Cancer  Your health care provider may recommend that you be screened regularly for cancer of the pelvic organs (ovaries, uterus, and vagina). This screening involves a pelvic examination, including checking for microscopic changes to the surface of your cervix (Pap test). You may be encouraged to have this screening done every 3 years, beginning at age 45.  For women ages 61-65, health care providers may recommend pelvic exams and Pap testing every 3 years, or they may recommend the Pap and pelvic exam, combined with testing for human papilloma virus (HPV), every 5 years. Some types of HPV increase your risk of cervical cancer. Testing for HPV may also be done on women of any age with unclear Pap test results.  Other health care providers may not recommend any screening for nonpregnant women who are considered low risk for pelvic cancer and who do not have symptoms. Ask your health care provider if a screening pelvic exam is right for you.  If you have had past treatment for cervical cancer or a condition that could lead to cancer, you need Pap tests and screening for cancer for at least 20 years after your treatment. If Pap tests have been discontinued, your risk factors (such as having a new sexual partner) need to be reassessed to determine if screening should resume. Some women have medical problems that increase the chance of getting cervical cancer. In these cases, your health care provider may recommend more frequent screening and Pap tests. Colorectal Cancer  This type of cancer can be detected and often prevented.  Routine colorectal cancer screening usually begins at 61 years of age and continues through 61 years of age.  Your health care provider may recommend screening at an earlier age if you have risk factors for colon cancer.  Your health care provider may also recommend using home test kits to check for hidden blood in the stool.  A small camera at the end of a  tube can be used to examine your colon directly (sigmoidoscopy or colonoscopy). This is done to check for the earliest forms of colorectal cancer.  Routine screening usually begins at age 40.  Direct examination of the colon should be repeated every 5-10 years through 61 years of age. However, you may need to be screened more often if early forms of precancerous polyps or small growths are found. Skin Cancer  Check your skin from head to toe regularly.  Tell your health care provider about any new moles or changes in moles, especially if there is a change in a mole's shape or color.  Also tell your health care provider if you have a mole that is larger than the size of a pencil eraser.  Always use sunscreen. Apply sunscreen liberally and repeatedly throughout the day.  Protect yourself by wearing long sleeves, pants, a wide-brimmed hat, and sunglasses whenever you are outside. Heart disease, diabetes, and high blood pressure  High blood pressure causes heart disease and increases the risk of stroke. High blood  pressure is more likely to develop in:  People who have blood pressure in the high end of the normal range (130-139/85-89 mm Hg).  People who are overweight or obese.  People who are African American.  If you are 44-73 years of age, have your blood pressure checked every 3-5 years. If you are 66 years of age or older, have your blood pressure checked every year. You should have your blood pressure measured twice-once when you are at a hospital or clinic, and once when you are not at a hospital or clinic. Record the average of the two measurements. To check your blood pressure when you are not at a hospital or clinic, you can use:  An automated blood pressure machine at a pharmacy.  A home blood pressure monitor.  If you are between 77 years and 94 years old, ask your health care provider if you should take aspirin to prevent strokes.  Have regular diabetes screenings. This  involves taking a blood sample to check your fasting blood sugar level.  If you are at a normal weight and have a low risk for diabetes, have this test once every three years after 60 years of age.  If you are overweight and have a high risk for diabetes, consider being tested at a younger age or more often. Preventing infection Hepatitis B  If you have a higher risk for hepatitis B, you should be screened for this virus. You are considered at high risk for hepatitis B if:  You were born in a country where hepatitis B is common. Ask your health care provider which countries are considered high risk.  Your parents were born in a high-risk country, and you have not been immunized against hepatitis B (hepatitis B vaccine).  You have HIV or AIDS.  You use needles to inject street drugs.  You live with someone who has hepatitis B.  You have had sex with someone who has hepatitis B.  You get hemodialysis treatment.  You take certain medicines for conditions, including cancer, organ transplantation, and autoimmune conditions. Hepatitis C  Blood testing is recommended for:  Everyone born from [birth year] through 1965.  Anyone with known risk factors for hepatitis C. Sexually transmitted infections (STIs)  You should be screened for sexually transmitted infections (STIs) including gonorrhea and chlamydia if:  You are sexually active and are younger than 61 years of age.  You are older than 61 years of age and your health care provider tells you that you are at risk for this type of infection.  Your sexual activity has changed since you were last screened and you are at an increased risk for chlamydia or gonorrhea. Ask your health care provider if you are at risk.  If you do not have HIV, but are at risk, it may be recommended that you take a prescription medicine daily to prevent HIV infection. This is called pre-exposure prophylaxis (PrEP). You are considered at risk if:  You are  sexually active and do not regularly use condoms or know the HIV status of your partner(s).  You take drugs by injection.  You are sexually active with a partner who has HIV. Talk with your health care provider about whether you are at high risk of being infected with HIV. If you choose to begin PrEP, you should first be tested for HIV. You should then be tested every 3 months for as long as you are taking PrEP. Pregnancy  If you are premenopausal and you may  become pregnant, ask your health care provider about preconception counseling.  If you may become pregnant, take 400 to 800 micrograms (mcg) of folic acid every day.  If you want to prevent pregnancy, talk to your health care provider about birth control (contraception). Osteoporosis and menopause  Osteoporosis is a disease in which the bones lose minerals and strength with aging. This can result in serious bone fractures. Your risk for osteoporosis can be identified using a bone density scan.  If you are 76 years of age or older, or if you are at risk for osteoporosis and fractures, ask your health care provider if you should be screened.  Ask your health care provider whether you should take a calcium or vitamin D supplement to lower your risk for osteoporosis.  Menopause may have certain physical symptoms and risks.  Hormone replacement therapy may reduce some of these symptoms and risks. Talk to your health care provider about whether hormone replacement therapy is right for you. Follow these instructions at home:  Schedule regular health, dental, and eye exams.  Stay current with your immunizations.  Do not use any tobacco products including cigarettes, chewing tobacco, or electronic cigarettes.  If you are pregnant, do not drink alcohol.  If you are breastfeeding, limit how much and how often you drink alcohol.  Limit alcohol intake to no more than 1 drink per day for nonpregnant women. One drink equals 12 ounces of  beer, 5 ounces of wine, or 1 ounces of hard liquor.  Do not use street drugs.  Do not share needles.  Ask your health care provider for help if you need support or information about quitting drugs.  Tell your health care provider if you often feel depressed.  Tell your health care provider if you have ever been abused or do not feel safe at home. This information is not intended to replace advice given to you by your health care provider. Make sure you discuss any questions you have with your health care provider. Document Released: 08/04/2010 Document Revised: 06/27/2015 Document Reviewed: 10/23/2014 Elsevier Interactive Patient Education  2017 [employer].

## 2016-05-21 NOTE — Progress Notes (Signed)
Pre visit review using our clinic review tool, if applicable. No additional management support is needed unless otherwise documented below in the visit note. 

## 2016-05-21 NOTE — Progress Notes (Addendum)
Subjective:    Patient ID: Stacie [name], female    DOB: 07-14-1955, 61 y.o.   MRN: [id number]  Patient presents today for complete physical  HPI  HTN: stable with amlodipine and metoprolol.  Hyperlipidemia: stable with pravachol.  Depression: stable with lexapro and trazodone.  Immunizations: (TDAP, Hep C screen, Pneumovax, Influenza, zoster)  Health Maintenance  Topic Date Due  . Tetanus Vaccine  06/10/1974  . Mammogram  06/09/2005  . Colon Cancer Screening  06/09/2005  . Flu Shot  09/02/2016  . Pap Smear  05/22/2019  .  Hepatitis C: One time screening is recommended by Center for Disease Control  (CDC) for  adults born from [birth year] through 1965.   Completed  . HIV Screening  Completed   Diet:regular Weight:  Wt Readings from Last 3 Encounters:  05/21/16 104 lb (47.2 kg)  03/10/16 99 lb (44.9 kg)  02/28/16 99 lb 8 oz (45.1 kg)   Exercise:none Fall Risk: Fall Risk  05/25/2016  Falls in the past year? No   Home Safety:home alone Depression/Suicide: Depression screen [facility] 2/9 05/25/2016  Decreased Interest 0  Down, Depressed, Hopeless 0  PHQ - 2 Score 0   No flowsheet data found. Colonoscopy (every 5-68yrs, >50-40yrs):done last year while in [place] (she will bring results) Pap Smear (every 61yrs for >21-29 without HPV, every 36yrs for >30-34yrs with HPV):needed Mammogram (yearly, >93yrs):needed Vision:needed Dental:needed Advanced Directive:declined No flowsheet data found. Sexual History (birth control, marital status, STD):single, not sexually active   Medications and allergies reviewed with patient and updated if appropriate.  Patient Active Problem List   Diagnosis Date Noted  . Hyperlipidemia 02/28/2016  . Essential hypertension 02/21/2016  . 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 Hearing loss 02/21/2016    Current Outpatient Prescriptions on File Prior to Visit  Medication Sig Dispense Refill  . amLODipine (NORVASC)  10 MG tablet Take 1 tablet (10 mg total) by mouth daily. 90 tablet 3   No current facility-administered medications on file prior to visit.     Past Medical History:  Diagnosis Date  . Colitis   . Hypertension   . Stroke [facility]) 04/2012   unknown type    No past surgical history on file.  Social History   Social History  . Marital status: Divorced    Spouse name: N/A  . Number of children: N/A  . Years of education: N/A   Social History Main Topics  . Smoking status: Current Every Day Smoker  . Smokeless tobacco: Never Used  . Alcohol use 8.4 oz/week    14 Cans of beer per week  . Drug use: Yes    Frequency: 7.0 times per week    Types: Marijuana     Comment: to help with colitis  . Sexual activity: Not Currently   Other Topics Concern  . None   Social History Narrative  . None    Family History  Problem Relation Age of Onset  . Stroke Mother 51    embolic  . Heart disease Father 56    CAd with CABG, no MI  . Heart disease Sister 65    CAD  . Heart disease Paternal Uncle   . Heart disease Maternal Grandmother   . Heart disease Paternal Grandfather         Review of Systems  Constitutional: Negative for fever, malaise/fatigue and weight loss.  HENT: Negative for congestion and sore throat.   Eyes:  Negative for visual changes  Respiratory: Negative for cough and shortness of breath.   Cardiovascular: Negative for chest pain, palpitations and leg swelling.  Gastrointestinal: Negative for blood in stool, constipation, diarrhea and heartburn.  Genitourinary: Negative for dysuria, frequency and urgency.  Musculoskeletal: Negative for falls, joint pain and myalgias.  Skin: Positive for rash.       Recurrent perineal rash: no improves with topical antifungal or corticosteroid.  Neurological: Negative for dizziness, sensory change and headaches.  Endo/Heme/Allergies: Does not bruise/bleed easily.  Psychiatric/Behavioral: Negative for depression,  substance abuse and suicidal ideas. The patient is not nervous/anxious.     Objective:   Vitals:   05/21/16 1307  BP: 132/88  Pulse: (!) 55  Temp: 97.5 F (36.4 C)    Body mass index is 17.31 kg/m.   Physical Examination:  Physical Exam  Constitutional: She is oriented to person, place, and time and well-developed, well-nourished, and in no distress. No distress.  HENT:  Right Ear: External ear normal.  Left Ear: External ear normal.  Nose: Nose normal.  Mouth/Throat: No oropharyngeal exudate.  Eyes: Conjunctivae and EOM are normal. Pupils are equal, round, and reactive to light. No scleral icterus.  Neck: Normal range of motion. Neck supple. No thyromegaly present.  Cardiovascular: Normal rate, regular rhythm, normal heart sounds and intact distal pulses.   Pulmonary/Chest: Effort normal and breath sounds normal. Right breast exhibits no mass, no nipple discharge and no skin change. Left breast exhibits no mass, no nipple discharge and no skin change. Breasts are symmetrical.  Abdominal: Soft. Bowel sounds are normal. She exhibits no distension. There is no tenderness.  Genitourinary: Rectum normal, vagina normal, cervix normal, right adnexa normal and left adnexa normal. Cervix exhibits no motion tenderness. Vulva exhibits rash. No vaginal discharge found.  Genitourinary Comments: Erythematous macular rash on perineal region. nontender.  Musculoskeletal: Normal range of motion. She exhibits no edema or tenderness.  Lymphadenopathy:    She has no cervical adenopathy.  Neurological: She is alert and oriented to person, place, and time. Gait normal.  Skin: Skin is warm and dry.  Psychiatric: Affect and judgment normal.  Vitals reviewed.   ASSESSMENT and PLAN:  [name] was seen today for annual exam.  Diagnoses and all orders for this visit:  Encounter for preventative adult health care examination -     CBC; Future -     TSH; Future -     Cytology - PAP -     MM  DIGITAL SCREENING BILATERAL; Future -     Cancel: Ambulatory referral to Gastroenterology -     Hepatitis C Antibody; Future -     HIV antibody; Future  Recurrent major depressive disorder, in full remission ([place]) -     escitalopram (LEXAPRO) 10 MG tablet; Take 1 tablet (10 mg total) by mouth daily.  Insomnia, unspecified type -     traZODone (DESYREL) 100 MG tablet; Take 1 tablet (100 mg total) by mouth at bedtime.  Essential hypertension -     metoprolol tartrate (LOPRESSOR) 25 MG tablet; Take 1 tablet (25 mg total) by mouth 2 (two) times daily.  Hyperlipidemia, unspecified hyperlipidemia type -     pravastatin (PRAVACHOL) 40 MG tablet; Take 1 tablet (40 mg total) by mouth daily. -     Lipid panel; Future  Encounter for screening mammogram for breast cancer -     MM DIGITAL SCREENING BILATERAL; Future  Encounter for Papanicolaou smear for cervical cancer screening -  Cytology - PAP  Perineal rash in female -     Ambulatory referral to Dermatology  Need for hepatitis C screening test -     Hepatitis C Antibody; Future   No problem-specific Assessment & Plan notes found for this encounter.     Follow up: Return in about 6 months (around 11/20/2016) for HTN and, hyperlipidemia.  Wilfred Lacy, NP

## 2016-05-22 LAB — HEPATITIS C ANTIBODY: HCV Ab: NEGATIVE

## 2016-05-22 LAB — HIV ANTIBODY (ROUTINE TESTING W REFLEX): HIV 1&2 Ab, 4th Generation: NONREACTIVE

## 2016-05-27 LAB — CYTOLOGY - PAP: HPV: NOT DETECTED

## 2016-05-29 ENCOUNTER — Telehealth: Payer: Self-pay | Admitting: Nurse Practitioner

## 2016-05-29 MED ORDER — ROSUVASTATIN CALCIUM 20 MG PO TABS: 20.0000 mg | ORAL_TABLET | Freq: Every day | ORAL | 1 refills | Status: DC

## 2016-05-29 NOTE — Telephone Encounter (Signed)
Pt would like results from 4/19  Please call back

## 2016-06-01 ENCOUNTER — Ambulatory Visit: Payer: Medicare Other | Admitting: Nurse Practitioner

## 2016-06-04 ENCOUNTER — Encounter: Payer: Self-pay | Admitting: Nurse Practitioner

## 2016-06-15 ENCOUNTER — Other Ambulatory Visit: Payer: Self-pay

## 2016-06-15 DIAGNOSIS — D492 Neoplasm of unspecified behavior of bone, soft tissue, and skin: Secondary | ICD-10-CM | POA: Diagnosis not present

## 2016-06-15 DIAGNOSIS — L900 Lichen sclerosus et atrophicus: Secondary | ICD-10-CM | POA: Diagnosis not present

## 2017-05-03 ENCOUNTER — Other Ambulatory Visit: Payer: Self-pay | Admitting: Nurse Practitioner

## 2017-05-03 DIAGNOSIS — I10 Essential (primary) hypertension: Secondary | ICD-10-CM

## 2017-05-03 MED ORDER — AMLODIPINE BESYLATE 10 MG PO TABS: 10.0000 mg | ORAL_TABLET | Freq: Every day | ORAL | 0 refills | Status: DC

## 2017-05-03 NOTE — Telephone Encounter (Signed)
Copied from [place] [id number]. Topic: Quick Communication - Rx Refill/Question >> May 03, 2017 12:15 PM Robina Ade, Helene Kelp D wrote: Medication:amLODipine ([place]) 10 MG tablet Has the patient contacted their pharmacy?Yes (Agent: If no, request that the patient contact the pharmacy for the refill.) Preferred Pharmacy (with phone number or street name): [id number] - [place], [place]: Please be advised that RX refills may take up to 3 business days. We ask that you follow-up with your pharmacy.

## 2017-05-03 NOTE — Telephone Encounter (Signed)
Left Vm re: refill request. Pt needs appt scheduled prior to refill. LOV 05/21/16;  was to return for HTN  f/u 11/2016.

## 2017-05-03 NOTE — Telephone Encounter (Signed)
Patient called and made aware of the appointment that needs to be made before filling amlodipine, she says "but I am out of my medication."  I advised that once the appointment is scheduled, she can receive a courtesy amount until her office visit, she verbalized understanding. Appointment scheduled for Friday, 06/04/17 at 1330 for physical.

## 2017-06-04 ENCOUNTER — Encounter: Payer: Self-pay | Admitting: Nurse Practitioner

## 2017-06-04 ENCOUNTER — Telehealth: Payer: Self-pay | Admitting: Nurse Practitioner

## 2017-06-04 ENCOUNTER — Ambulatory Visit (INDEPENDENT_AMBULATORY_CARE_PROVIDER_SITE_OTHER): Payer: Medicare Other | Admitting: Nurse Practitioner

## 2017-06-04 ENCOUNTER — Other Ambulatory Visit (HOSPITAL_COMMUNITY)
Admission: RE | Admit: 2017-06-04 | Discharge: 2017-06-04 | Disposition: A | Discharge status: Home / Self Care | Payer: Medicare Other | Source: Ambulatory Visit | Attending: Nurse Practitioner | Admitting: Nurse Practitioner

## 2017-06-04 VITALS — BP 136/80 | HR 77 | Temp 97.6°F | Ht 65.0 in | Wt 94.6 lb

## 2017-06-04 DIAGNOSIS — E039 Hypothyroidism, unspecified: Secondary | ICD-10-CM | POA: Diagnosis not present

## 2017-06-04 DIAGNOSIS — Z1231 Encounter for screening mammogram for malignant neoplasm of breast: Secondary | ICD-10-CM

## 2017-06-04 DIAGNOSIS — I10 Essential (primary) hypertension: Secondary | ICD-10-CM

## 2017-06-04 DIAGNOSIS — R7989 Other specified abnormal findings of blood chemistry: Secondary | ICD-10-CM | POA: Diagnosis not present

## 2017-06-04 DIAGNOSIS — E782 Mixed hyperlipidemia: Secondary | ICD-10-CM

## 2017-06-04 DIAGNOSIS — Z124 Encounter for screening for malignant neoplasm of cervix: Secondary | ICD-10-CM | POA: Insufficient documentation

## 2017-06-04 DIAGNOSIS — F3342 Major depressive disorder, recurrent, in full remission: Secondary | ICD-10-CM | POA: Diagnosis not present

## 2017-06-04 DIAGNOSIS — Z1211 Encounter for screening for malignant neoplasm of colon: Secondary | ICD-10-CM

## 2017-06-04 DIAGNOSIS — Z0001 Encounter for general adult medical examination with abnormal findings: Secondary | ICD-10-CM | POA: Insufficient documentation

## 2017-06-04 LAB — COMPREHENSIVE METABOLIC PANEL
AG RATIO: 1.8 (calc) (ref 1.0–2.5)
ALBUMIN MSPROF: 4.6 g/dL (ref 3.6–5.1)
ALT: 9 U/L (ref 6–29)
AST: 16 U/L (ref 10–35)
Alkaline phosphatase (APISO): 110 U/L (ref 33–130)
BUN: 15 mg/dL (ref 7–25)
CO2: 30 mmol/L (ref 20–32)
CREATININE: 0.74 mg/dL (ref 0.50–0.99)
Calcium: 9.7 mg/dL (ref 8.6–10.4)
Chloride: 102 mmol/L (ref 98–110)
GLUCOSE: 94 mg/dL (ref 65–99)
Globulin: 2.6 g/dL (calc) (ref 1.9–3.7)
POTASSIUM: 4.1 mmol/L (ref 3.5–5.3)
SODIUM: 142 mmol/L (ref 135–146)
Total Bilirubin: 0.5 mg/dL (ref 0.2–1.2)
Total Protein: 7.2 g/dL (ref 6.1–8.1)

## 2017-06-04 LAB — LIPID PANEL
CHOL/HDL RATIO: 4.6 (calc) (ref <5.0)
Cholesterol: 253 mg/dL — ABNORMAL HIGH (ref <200)
HDL: 55 mg/dL (ref >50)
LDL Cholesterol (Calc): 164 mg/dL (calc) — ABNORMAL HIGH
NON-HDL CHOLESTEROL (CALC): 198 mg/dL — AB (ref <130)
TRIGLYCERIDES: 188 mg/dL — AB (ref <150)

## 2017-06-04 LAB — THYROID PANEL WITH TSH: TSH: 5.76 m[IU]/L — AB (ref 0.40–4.50)

## 2017-06-04 MED ORDER — BUPROPION HCL 75 MG PO TABS: 75.0000 mg | ORAL_TABLET | Freq: Two times a day (BID) | ORAL | 0 refills | Status: DC

## 2017-06-04 MED ORDER — AMLODIPINE BESYLATE 10 MG PO TABS: 10.0000 mg | ORAL_TABLET | Freq: Every day | ORAL | 3 refills | Status: DC

## 2017-06-04 MED ORDER — ESCITALOPRAM OXALATE 10 MG PO TABS: 10.0000 mg | ORAL_TABLET | Freq: Every day | ORAL | 3 refills | Status: DC

## 2017-06-04 NOTE — Telephone Encounter (Signed)
Order for cologuard placed.   Order [id number]

## 2017-06-04 NOTE — Progress Notes (Signed)
Subjective:    Patient ID: Stacie [name], female    DOB: 12/20/1955, 62 y.o.   MRN: [id number]  Patient presents today for complete physical  Anxiety  Presents for follow-up visit. Symptoms include compulsions, decreased concentration, excessive worry, insomnia, irritability, nervous/anxious behavior, obsessions and restlessness. Patient reports no chest pain, confusion, depressed mood, dizziness, dry mouth, feeling of choking, hyperventilation, impotence, malaise, muscle tension, nausea, palpitations, panic, shortness of breath or suicidal ideas. Symptoms occur constantly. The quality of sleep is fair. Nighttime awakenings: occasional.    Nicotine Dependence  Presents for initial visit. Symptoms include decreased concentration, insomnia and irritability. Symptoms are negative for sore throat. Preferred tobacco types include cigarettes. Preferred cigarette types include filtered. Preferred strength is regular. Her urge triggers include company of smokers, drinking coffee and stress. (Fidgeting, to keep hands busy) Her first smoke is in the afternoon. She smokes < 1/2 a pack of cigarettes per day. She started smoking when she was >35 years old. Past treatments include nothing. Gae is ready to quit. Barbette has tried to quit 0 times. There is no history of alcohol abuse and drug use.   Immunizations: (TDAP, Hep C screen, Pneumovax, Influenza, zoster)  Health Maintenance  Topic Date Due  . Tetanus Vaccine  06/10/1974  . Mammogram  06/09/2005  . Colon Cancer Screening  06/09/2005  . Flu Shot  09/02/2017  . Pap Smear  05/22/2019  .  Hepatitis C: One time screening is recommended by Center for Disease Control  (CDC) for  adults born from [birth year] through 1965.   Completed  . HIV Screening  Completed   Diet:regular.  Weight:  Wt Readings from Last 3 Encounters:  06/04/17 94 lb 9.6 oz (42.9 kg)  05/21/16 104 lb (47.2 kg)  03/10/16 99 lb (44.9 kg)   Exercise:none.  Fall Risk: Fall Risk   06/04/2017 05/25/2016  Falls in the past year? No No   Home Safety:home alone.  Depression/Suicide: Depression screen [facility] 2/9 06/04/2017 05/25/2016  Decreased Interest 0 0  Down, Depressed, Hopeless 0 0  PHQ - 2 Score 0 0   Vision:needed.  Dental:up to date.  Advanced Directive: Advanced Directives 06/04/2017  Does Patient Have a Medical Advance Directive? No  Would patient like information on creating a medical advance directive? Yes (MAU/Ambulatory/Procedural Areas - Information given)    Medications and allergies reviewed with patient and updated if appropriate.  Patient Active Problem List   Diagnosis Date Noted  . Hyperlipidemia 02/28/2016  . Essential hypertension 02/21/2016  . 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 Hearing loss 02/21/2016    Current Outpatient Medications on File Prior to Visit  Medication Sig Dispense Refill  . Doxylamine Succinate, Sleep, (UNISOM PO) Take by mouth. Help sleep    . metoprolol tartrate (LOPRESSOR) 25 MG tablet Take 1 tablet (25 mg total) by mouth 2 (two) times daily. (Patient not taking: Reported on 06/04/2017) 180 tablet 2  . traZODone (DESYREL) 100 MG tablet Take 1 tablet (100 mg total) by mouth at bedtime. (Patient not taking: Reported on 06/04/2017) 90 tablet 2   No current facility-administered medications on file prior to visit.     Past Medical History:  Diagnosis Date  . Colitis   . Hypertension   . Stroke [facility]) 04/2012   unknown type    History reviewed. No pertinent surgical history.  Social History   Socioeconomic History  . Marital status: Divorced    Spouse name: Not on file  . Number  of children: Not on file  . Years of education: Not on file  . Highest education level: Not on file  Occupational History  . Not on file  Social Needs  . Financial resource strain: Not on file  . Food insecurity:    Worry: Not on file    Inability: Not on file  . Transportation needs:     Medical: Not on file    Non-medical: Not on file  Tobacco Use  . Smoking status: Current Every Day Smoker    Packs/day: 0.25    Years: 10.00    Pack years: 2.50  . Smokeless tobacco: Never Used  Substance and Sexual Activity  . Alcohol use: Yes    Alcohol/week: 8.4 oz    Types: 14 Cans of beer per week  . Drug use: Yes    Frequency: 7.0 times per week    Types: Marijuana    Comment: to help with colitis  . Sexual activity: Not Currently  Lifestyle  . Physical activity:    Days per week: Not on file    Minutes per session: Not on file  . Stress: Not on file  Relationships  . Social connections:    Talks on phone: Not on file    Gets together: Not on file    Attends religious service: Not on file    Active member of club or organization: Not on file    Attends meetings of clubs or organizations: Not on file    Relationship status: Not on file  Other Topics Concern  . Not on file  Social History Narrative  . Not on file    Family History  Problem Relation Age of Onset  . Stroke Mother 41       embolic  . Heart disease Father 27       CAd with CABG, no MI  . Heart disease Sister 20       CAD  . Heart disease Paternal Uncle   . Heart disease Maternal Grandmother   . Heart disease Paternal Grandfather        Review of Systems  Constitutional: Positive for irritability. Negative for fever, malaise/fatigue and weight loss.  HENT: Negative for congestion and sore throat.   Eyes:       Negative for visual changes  Respiratory: Negative for cough and shortness of breath.   Cardiovascular: Negative for chest pain, palpitations and leg swelling.  Gastrointestinal: Negative for blood in stool, constipation, diarrhea, heartburn and nausea.  Genitourinary: Negative for dysuria, frequency, impotence and urgency.  Musculoskeletal: Negative for falls, joint pain and myalgias.  Skin: Negative for rash.  Neurological: Negative for dizziness, sensory change and headaches.    Endo/Heme/Allergies: Does not bruise/bleed easily.  Psychiatric/Behavioral: Positive for decreased concentration. Negative for confusion, depression, memory loss, substance abuse and suicidal ideas. The patient is nervous/anxious and has insomnia.     Objective:   Vitals:   06/04/17 1324  BP: 136/80  Pulse: 77  Temp: 97.6 F (36.4 C)  SpO2: 99%    Body mass index is 15.74 kg/m.   Physical Examination:  Physical Exam  Constitutional: She is oriented to person, place, and time. No distress.  HENT:  Right Ear: External ear normal.  Left Ear: External ear normal.  Nose: Nose normal.  Mouth/Throat: No oropharyngeal exudate.  Eyes: Pupils are equal, round, and reactive to light. Conjunctivae and EOM are normal. No scleral icterus.  Neck: Normal range of motion. Neck supple. No JVD present. No  thyromegaly present.  Cardiovascular: Normal rate, regular rhythm, normal heart sounds and intact distal pulses.  Pulmonary/Chest: Effort normal and breath sounds normal. Right breast exhibits inverted nipple. Right breast exhibits no mass, no nipple discharge, no skin change and no tenderness. Left breast exhibits no inverted nipple, no mass, no nipple discharge, no skin change and no tenderness. Breasts are symmetrical.  Chronic inverted right nipple per patient  Abdominal: Soft. Bowel sounds are normal. She exhibits no distension. There is no tenderness. Hernia confirmed negative in the right inguinal area and confirmed negative in the left inguinal area.  Genitourinary: Rectum normal. Rectal exam shows no external hemorrhoid. There is no rash or tenderness on the right labia. There is no rash or tenderness on the left labia. Cervix exhibits no motion tenderness, no discharge and no friability. No erythema or bleeding in the vagina. No vaginal discharge found.  Genitourinary Comments: Vaginal atrophy  Musculoskeletal: Normal range of motion. She exhibits no edema or tenderness.   Lymphadenopathy:    She has no cervical adenopathy. No inguinal adenopathy noted on the right or left side.  Neurological: She is alert and oriented to person, place, and time.  Skin: Skin is warm and dry.  Psychiatric: She has a normal mood and affect. Her behavior is normal. Thought content normal.  Vitals reviewed.   ASSESSMENT and PLAN:  Margaretmary was seen today for annual exam.  Diagnoses and all orders for this visit:  Encounter for preventative adult health care exam with abnormal findings -     MM 3D SCREEN BREAST BILATERAL; Future -     Cancel: CBC -     Cancel: Comprehensive metabolic panel -     Cytology - PAP -     Cancel: Ambulatory referral to Gastroenterology -     CBC -     Comprehensive metabolic panel -     Lipid panel  Hypothyroidism, unspecified type -     Thyroid Panel With TSH -     levothyroxine (SYNTHROID, LEVOTHROID) 25 MCG tablet; Take 1 tablet (25 mcg total) by mouth daily before breakfast.  Breast cancer screening by mammogram  Encounter for Papanicolaou smear for cervical cancer screening -     Cytology - PAP  Colon cancer screening -     Cancel: Ambulatory referral to Gastroenterology  Essential hypertension -     amLODipine (NORVASC) 10 MG tablet; Take 1 tablet (10 mg total) by mouth daily. -     Comprehensive metabolic panel -     Lipid panel  Mixed hyperlipidemia -     Cancel: Lipid panel -     rosuvastatin (CRESTOR) 20 MG tablet; Take 1 tablet (20 mg total) by mouth daily. -     omega-3 acid ethyl esters (LOVAZA) 1 g capsule; Take 2 capsules (2 g total) by mouth 2 (two) times daily.  Recurrent major depressive disorder, in full remission ([place]) -     Discontinue: escitalopram (LEXAPRO) 10 MG tablet; Take 1 tablet (10 mg total) by mouth daily. -     buPROPion (WELLBUTRIN) 75 MG tablet; Take 1 tablet (75 mg total) by mouth 2 (two) times daily.   No problem-specific Assessment & Plan notes found for this encounter.     Follow up:  Return in about 2 weeks (around 06/18/2017) for depression and hyperactivity (repeat GAD and PHQ screen).  Wilfred Lacy, NP

## 2017-06-04 NOTE — Patient Instructions (Addendum)
An order has been placed for cologuard. You will be contacted by the company.  You will be called to schedule mammogram.  Stop lexapro Start wellbutrin 25m twice a day. Return to office in 2weeks.  Abnormal lipid panel, need to resume crestor. Refill sent Also added omega fish oil. rx sent. Stable cbc and cmp Elevated TSh which is an indication of hypothyroidism. levothyroxine sent. Maintain f/up appt in 2weeks  Health Maintenance, Female Adopting a healthy lifestyle and getting preventive care can go a long way to promote health and wellness. Talk with your health care provider about what schedule of regular examinations is right for you. This is a good chance for you to check in with your provider about disease prevention and staying healthy. In between checkups, there are plenty of things you can do on your own. Experts have done a lot of research about which lifestyle changes and preventive measures are most likely to keep you healthy. Ask your health care provider for more information. Weight and diet Eat a healthy diet  Be sure to include plenty of vegetables, fruits, low-fat dairy products, and lean protein.  Do not eat a lot of foods high in solid fats, added sugars, or salt.  Get regular exercise. This is one of the most important things you can do for your health. ? Most adults should exercise for at least 150 minutes each week. The exercise should increase your heart rate and make you sweat (moderate-intensity exercise). ? Most adults should also do strengthening exercises at least twice a week. This is in addition to the moderate-intensity exercise.  Maintain a healthy weight  Body mass index (BMI) is a measurement that can be used to identify possible weight problems. It estimates body fat based on height and weight. Your health care provider can help determine your BMI and help you achieve or maintain a healthy weight.  For females 23years of age and older: ? A BMI  below 18.5 is considered underweight. ? A BMI of 18.5 to 24.9 is normal. ? A BMI of 25 to 29.9 is considered overweight. ? A BMI of 30 and above is considered obese.  Watch levels of cholesterol and blood lipids  You should start having your blood tested for lipids and cholesterol at 62years of age, then have this test every 5 years.  You may need to have your cholesterol levels checked more often if: ? Your lipid or cholesterol levels are high. ? You are older than 62years of age. ? You are at high risk for heart disease.  Cancer screening Lung Cancer  Lung cancer screening is recommended for adults 51815years old who are at high risk for lung cancer because of a history of smoking.  A yearly low-dose CT scan of the lungs is recommended for people who: ? Currently smoke. ? Have quit within the past 15 years. ? Have at least a 30-pack-year history of smoking. A pack year is smoking an average of one pack of cigarettes a day for 1 year.  Yearly screening should continue until it has been 15 years since you quit.  Yearly screening should stop if you develop a health problem that would prevent you from having lung cancer treatment.  Breast Cancer  Practice breast self-awareness. This means understanding how your breasts normally appear and feel.  It also means doing regular breast self-exams. Let your health care provider know about any changes, no matter how small.  If you are in your  59s or 24s, you should have a clinical breast exam (CBE) by a health care provider every 1-3 years as part of a regular health exam.  If you are 14 or older, have a CBE every year. Also consider having a breast X-ray (mammogram) every year.  If you have a family history of breast cancer, talk to your health care provider about genetic screening.  If you are at high risk for breast cancer, talk to your health care provider about having an MRI and a mammogram every year.  Breast cancer gene  (BRCA) assessment is recommended for women who have family members with BRCA-related cancers. BRCA-related cancers include: ? Breast. ? Ovarian. ? Tubal. ? Peritoneal cancers.  Results of the assessment will determine the need for genetic counseling and BRCA1 and BRCA2 testing.  Cervical Cancer Your health care provider may recommend that you be screened regularly for cancer of the pelvic organs (ovaries, uterus, and vagina). This screening involves a pelvic examination, including checking for microscopic changes to the surface of your cervix (Pap test). You may be encouraged to have this screening done every 3 years, beginning at age 42.  For women ages 83-65, health care providers may recommend pelvic exams and Pap testing every 3 years, or they may recommend the Pap and pelvic exam, combined with testing for human papilloma virus (HPV), every 5 years. Some types of HPV increase your risk of cervical cancer. Testing for HPV may also be done on women of any age with unclear Pap test results.  Other health care providers may not recommend any screening for nonpregnant women who are considered low risk for pelvic cancer and who do not have symptoms. Ask your health care provider if a screening pelvic exam is right for you.  If you have had past treatment for cervical cancer or a condition that could lead to cancer, you need Pap tests and screening for cancer for at least 20 years after your treatment. If Pap tests have been discontinued, your risk factors (such as having a new sexual partner) need to be reassessed to determine if screening should resume. Some women have medical problems that increase the chance of getting cervical cancer. In these cases, your health care provider may recommend more frequent screening and Pap tests.  Colorectal Cancer  This type of cancer can be detected and often prevented.  Routine colorectal cancer screening usually begins at 62 years of age and continues  through 62 years of age.  Your health care provider may recommend screening at an earlier age if you have risk factors for colon cancer.  Your health care provider may also recommend using home test kits to check for hidden blood in the stool.  A small camera at the end of a tube can be used to examine your colon directly (sigmoidoscopy or colonoscopy). This is done to check for the earliest forms of colorectal cancer.  Routine screening usually begins at age 63.  Direct examination of the colon should be repeated every 5-10 years through 62 years of age. However, you may need to be screened more often if early forms of precancerous polyps or small growths are found.  Skin Cancer  Check your skin from head to toe regularly.  Tell your health care provider about any new moles or changes in moles, especially if there is a change in a mole's shape or color.  Also tell your health care provider if you have a mole that is larger than the size of  a pencil eraser.  Always use sunscreen. Apply sunscreen liberally and repeatedly throughout the day.  Protect yourself by wearing long sleeves, pants, a wide-brimmed hat, and sunglasses whenever you are outside.  Heart disease, diabetes, and high blood pressure  High blood pressure causes heart disease and increases the risk of stroke. High blood pressure is more likely to develop in: ? People who have blood pressure in the high end of the normal range (130-139/85-89 mm Hg). ? People who are overweight or obese. ? People who are African American.  If you are 53-6 years of age, have your blood pressure checked every 3-5 years. If you are 62 years of age or older, have your blood pressure checked every year. You should have your blood pressure measured twice-once when you are at a hospital or clinic, and once when you are not at a hospital or clinic. Record the average of the two measurements. To check your blood pressure when you are not at a  hospital or clinic, you can use: ? An automated blood pressure machine at a pharmacy. ? A home blood pressure monitor.  If you are between 62 years and 50 years old, ask your health care provider if you should take aspirin to prevent strokes.  Have regular diabetes screenings. This involves taking a blood sample to check your fasting blood sugar level. ? If you are at a normal weight and have a low risk for diabetes, have this test once every three years after 62 years of age. ? If you are overweight and have a high risk for diabetes, consider being tested at a younger age or more often. Preventing infection Hepatitis B  If you have a higher risk for hepatitis B, you should be screened for this virus. You are considered at high risk for hepatitis B if: ? You were born in a country where hepatitis B is common. Ask your health care provider which countries are considered high risk. ? Your parents were born in a high-risk country, and you have not been immunized against hepatitis B (hepatitis B vaccine). ? You have HIV or AIDS. ? You use needles to inject street drugs. ? You live with someone who has hepatitis B. ? You have had sex with someone who has hepatitis B. ? You get hemodialysis treatment. ? You take certain medicines for conditions, including cancer, organ transplantation, and autoimmune conditions.  Hepatitis C  Blood testing is recommended for: ? Everyone born from [birth year] through 1965. ? Anyone with known risk factors for hepatitis C.  Sexually transmitted infections (STIs)  You should be screened for sexually transmitted infections (STIs) including gonorrhea and chlamydia if: ? You are sexually active and are younger than 62 years of age. ? You are older than 62 years of age and your health care provider tells you that you are at risk for this type of infection. ? Your sexual activity has changed since you were last screened and you are at an increased risk for chlamydia or  gonorrhea. Ask your health care provider if you are at risk.  If you do not have HIV, but are at risk, it may be recommended that you take a prescription medicine daily to prevent HIV infection. This is called pre-exposure prophylaxis (PrEP). You are considered at risk if: ? You are sexually active and do not regularly use condoms or know the HIV status of your partner(s). ? You take drugs by injection. ? You are sexually active with a partner who has  HIV.  Talk with your health care provider about whether you are at high risk of being infected with HIV. If you choose to begin PrEP, you should first be tested for HIV. You should then be tested every 3 months for as long as you are taking PrEP. Pregnancy  If you are premenopausal and you may become pregnant, ask your health care provider about preconception counseling.  If you may become pregnant, take 400 to 800 micrograms (mcg) of folic acid every day.  If you want to prevent pregnancy, talk to your health care provider about birth control (contraception). Osteoporosis and menopause  Osteoporosis is a disease in which the bones lose minerals and strength with aging. This can result in serious bone fractures. Your risk for osteoporosis can be identified using a bone density scan.  If you are 24 years of age or older, or if you are at risk for osteoporosis and fractures, ask your health care provider if you should be screened.  Ask your health care provider whether you should take a calcium or vitamin D supplement to lower your risk for osteoporosis.  Menopause may have certain physical symptoms and risks.  Hormone replacement therapy may reduce some of these symptoms and risks. Talk to your health care provider about whether hormone replacement therapy is right for you. Follow these instructions at home:  Schedule regular health, dental, and eye exams.  Stay current with your immunizations.  Do not use any tobacco products including  cigarettes, chewing tobacco, or electronic cigarettes.  If you are pregnant, do not drink alcohol.  If you are breastfeeding, limit how much and how often you drink alcohol.  Limit alcohol intake to no more than 1 drink per day for nonpregnant women. One drink equals 12 ounces of beer, 5 ounces of wine, or 1 ounces of hard liquor.  Do not use street drugs.  Do not share needles.  Ask your health care provider for help if you need support or information about quitting drugs.  Tell your health care provider if you often feel depressed.  Tell your health care provider if you have ever been abused or do not feel safe at home. This information is not intended to replace advice given to you by your health care provider. Make sure you discuss any questions you have with your health care provider. Document Released: 08/04/2010 Document Revised: 06/27/2015 Document Reviewed: 10/23/2014 Elsevier Interactive Patient Education  [employer].

## 2017-06-05 LAB — CBC
HCT: 42.3 % (ref 35.0–45.0)
HEMOGLOBIN: 14.1 g/dL (ref 11.7–15.5)
MCH: 26.7 pg — ABNORMAL LOW (ref 27.0–33.0)
MCHC: 33.3 g/dL (ref 32.0–36.0)
MCV: 80.1 fL (ref 80.0–100.0)
MPV: 9.9 fL (ref 7.5–12.5)
PLATELETS: 369 10*3/uL (ref 140–400)
RBC: 5.28 10*6/uL — ABNORMAL HIGH (ref 3.80–5.10)
RDW: 14.3 % (ref 11.0–15.0)
WBC: 9.2 10*3/uL (ref 3.8–10.8)

## 2017-06-07 MED ORDER — LEVOTHYROXINE SODIUM 25 MCG PO TABS: 25.0000 ug | ORAL_TABLET | Freq: Every day | ORAL | 2 refills | Status: DC

## 2017-06-07 MED ORDER — OMEGA-3-ACID ETHYL ESTERS 1 G PO CAPS: 2.0000 g | ORAL_CAPSULE | Freq: Two times a day (BID) | ORAL | 5 refills | Status: DC

## 2017-06-07 MED ORDER — ROSUVASTATIN CALCIUM 20 MG PO TABS: 20.0000 mg | ORAL_TABLET | Freq: Every day | ORAL | 1 refills | Status: DC

## 2017-06-08 LAB — CYTOLOGY - PAP
Diagnosis: NEGATIVE
HPV: NOT DETECTED

## 2017-06-18 ENCOUNTER — Encounter: Payer: Self-pay | Admitting: Nurse Practitioner

## 2017-06-18 ENCOUNTER — Ambulatory Visit: Payer: Medicare Other | Admitting: Nurse Practitioner

## 2017-06-18 VITALS — BP 136/84 | HR 93 | Temp 98.1°F | Ht 65.0 in | Wt 93.8 lb

## 2017-06-18 DIAGNOSIS — F3342 Major depressive disorder, recurrent, in full remission: Secondary | ICD-10-CM

## 2017-06-18 DIAGNOSIS — E782 Mixed hyperlipidemia: Secondary | ICD-10-CM | POA: Diagnosis not present

## 2017-06-18 DIAGNOSIS — F411 Generalized anxiety disorder: Secondary | ICD-10-CM | POA: Diagnosis not present

## 2017-06-18 MED ORDER — ESCITALOPRAM OXALATE 20 MG PO TABS: 20.0000 mg | ORAL_TABLET | Freq: Every day | ORAL | 5 refills | Status: DC

## 2017-06-18 NOTE — Progress Notes (Signed)
Subjective:  Patient ID: Stacie [name], female    DOB: Jan 28, 1956  Age: 62 y.o. MRN: [id number]  CC: Follow-up (diarrhea and vomit since start new meds)  HPI  Anxiety and depression: Unable to tolerate wellbutrin. developed nausea and diarrhea. Did not take medication today, hence has no GI symptoms. Persistent increased anxiety. Denies any SI or HI.  Hyperlipidemia: She also put omega 3 and crestor on hold due to fear of possible cause of GI symptoms.  GAD 7 : Generalized Anxiety Score 06/18/2017  Nervous, Anxious, on Edge 3  Control/stop worrying 3  Worry too much - different things 3  Trouble relaxing 2  Restless 3  Easily annoyed or irritable 1  Afraid - awful might happen 3  Total GAD 7 Score 18  Anxiety Difficulty Somewhat difficult   Depression screen [facility] 2/9 06/18/2017 06/04/2017 05/25/2016  Decreased Interest 2 0 0  Down, Depressed, Hopeless 2 0 0  PHQ - 2 Score 4 0 0  Altered sleeping 0 - -  Tired, decreased energy 0 - -  Change in appetite 3 - -  Feeling bad or failure about yourself  2 - -  Trouble concentrating 2 - -  Moving slowly or fidgety/restless 3 - -  Suicidal thoughts 0 - -  PHQ-9 Score 14 - -  Difficult doing work/chores Somewhat difficult - -   Outpatient Medications Prior to Visit  Medication Sig Dispense Refill  . amLODipine (NORVASC) 10 MG tablet Take 1 tablet (10 mg total) by mouth daily. 90 tablet 3  . Doxylamine Succinate, Sleep, (UNISOM PO) Take by mouth. Help sleep    . levothyroxine (SYNTHROID, LEVOTHROID) 25 MCG tablet Take 1 tablet (25 mcg total) by mouth daily before breakfast. 30 tablet 2  . omega-3 acid ethyl esters (LOVAZA) 1 g capsule Take 2 capsules (2 g total) by mouth 2 (two) times daily. 120 capsule 5  . rosuvastatin (CRESTOR) 20 MG tablet Take 1 tablet (20 mg total) by mouth daily. 90 tablet 1  . buPROPion (WELLBUTRIN) 75 MG tablet Take 1 tablet (75 mg total) by mouth 2 (two) times daily. 30 tablet 0  . metoprolol tartrate  (LOPRESSOR) 25 MG tablet Take 1 tablet (25 mg total) by mouth 2 (two) times daily. 180 tablet 2  . traZODone (DESYREL) 100 MG tablet Take 1 tablet (100 mg total) by mouth at bedtime. (Patient not taking: Reported on 06/04/2017) 90 tablet 2   No facility-administered medications prior to visit.     ROS See HPI  Objective:  BP 136/84   Pulse 93   Temp 98.1 F (36.7 C) (Oral)   Ht 5\' 5"  (1.651 m)   Wt 93 lb 12.8 oz (42.5 kg)   SpO2 96%   BMI 15.61 kg/m   BP Readings from Last 3 Encounters:  06/18/17 136/84  06/04/17 136/80  05/21/16 132/88    Wt Readings from Last 3 Encounters:  06/18/17 93 lb 12.8 oz (42.5 kg)  06/04/17 94 lb 9.6 oz (42.9 kg)  05/21/16 104 lb (47.2 kg)    Physical Exam  Constitutional: She is oriented to person, place, and time.  Cardiovascular: Normal rate.  Pulmonary/Chest: Effort normal.  Abdominal: Soft. Bowel sounds are normal. She exhibits no distension. There is no tenderness.  Neurological: She is alert and oriented to person, place, and time.  Psychiatric: Her mood appears anxious. She is agitated. She expresses no homicidal and no suicidal ideation.  Vitals reviewed.   Lab Results  Component Value Date  WBC 9.2 06/04/2017   HGB 14.1 06/04/2017   HCT 42.3 06/04/2017   PLT 369 06/04/2017   GLUCOSE 94 06/04/2017   CHOL 253 (H) 06/04/2017   TRIG 188 (H) 06/04/2017   HDL 55 06/04/2017   LDLCALC 164 (H) 06/04/2017   ALT 9 06/04/2017   AST 16 06/04/2017   NA 142 06/04/2017   K 4.1 06/04/2017   CL 102 06/04/2017   CREATININE 0.74 06/04/2017   BUN 15 06/04/2017   CO2 30 06/04/2017   TSH 5.76 (H) 06/04/2017    No results found.  Assessment & Plan:   Stacie [name] was seen today for follow-up.  Diagnoses and all orders for this visit:  Recurrent major depressive disorder, in full remission ([place]) -     Discontinue: escitalopram (LEXAPRO) 20 MG tablet; Take 1 tablet (20 mg total) by mouth daily. -     escitalopram (LEXAPRO) 20 MG tablet;  Take 1 tablet (20 mg total) by mouth daily.  Mixed hyperlipidemia  GAD (generalized anxiety disorder) -     Discontinue: escitalopram (LEXAPRO) 20 MG tablet; Take 1 tablet (20 mg total) by mouth daily. -     escitalopram (LEXAPRO) 20 MG tablet; Take 1 tablet (20 mg total) by mouth daily.   I have discontinued Chloey Arts's traZODone, metoprolol tartrate, and buPROPion. I am also having her maintain her (Doxylamine Succinate, Sleep, (UNISOM PO)), amLODipine, rosuvastatin, levothyroxine, omega-3 acid ethyl esters, and escitalopram.  Meds ordered this encounter  Medications  . DISCONTD: escitalopram (LEXAPRO) 20 MG tablet    Sig: Take 1 tablet (20 mg total) by mouth daily.    Dispense:  30 tablet    Refill:  5    Order Specific Question:   Supervising Provider    Answer:   Lucille Passy [[id number]]  . escitalopram (LEXAPRO) 20 MG tablet    Sig: Take 1 tablet (20 mg total) by mouth daily.    Dispense:  30 tablet    Refill:  5    Order Specific Question:   Supervising Provider    Answer:   Lucille Passy [[id number]]    Follow-up: Return in about 2 months (around 08/18/2017) for anxiety and depression.  Wilfred Lacy, NP

## 2017-06-18 NOTE — Patient Instructions (Addendum)
Stop wellbutrin, crestor and omega 3 till GI symptoms resolve. Then resume omega 3 x 1week, then add crestor last.  Resume lexapro, take 1tab twice a day till you finish current prescription.  Encourage small frequent meals Also start Ensure or Boost drinks twice a day in addition.

## 2017-06-21 ENCOUNTER — Other Ambulatory Visit: Payer: Self-pay | Admitting: Nurse Practitioner

## 2017-06-21 DIAGNOSIS — F3342 Major depressive disorder, recurrent, in full remission: Secondary | ICD-10-CM

## 2017-08-25 ENCOUNTER — Encounter: Payer: Self-pay | Admitting: Nurse Practitioner

## 2017-08-25 ENCOUNTER — Ambulatory Visit: Payer: Medicare Other | Admitting: Nurse Practitioner

## 2017-08-25 VITALS — BP 120/72 | HR 74 | Temp 98.4°F | Ht 65.0 in | Wt 94.0 lb

## 2017-08-25 DIAGNOSIS — F3342 Major depressive disorder, recurrent, in full remission: Secondary | ICD-10-CM | POA: Diagnosis not present

## 2017-08-25 DIAGNOSIS — E782 Mixed hyperlipidemia: Secondary | ICD-10-CM | POA: Diagnosis not present

## 2017-08-25 DIAGNOSIS — F411 Generalized anxiety disorder: Secondary | ICD-10-CM | POA: Diagnosis not present

## 2017-08-25 DIAGNOSIS — E039 Hypothyroidism, unspecified: Secondary | ICD-10-CM | POA: Diagnosis not present

## 2017-08-25 MED ORDER — ESCITALOPRAM OXALATE 20 MG PO TABS: 20.0000 mg | ORAL_TABLET | Freq: Every day | ORAL | 5 refills | Status: DC

## 2017-08-25 MED ORDER — LEVOTHYROXINE SODIUM 25 MCG PO TABS: 25.0000 ug | ORAL_TABLET | Freq: Every day | ORAL | 3 refills | Status: DC

## 2017-08-25 MED ORDER — MIRTAZAPINE 7.5 MG PO TABS: 7.5000 mg | ORAL_TABLET | Freq: Every day | ORAL | 5 refills | Status: DC

## 2017-08-25 MED ORDER — PRAVASTATIN SODIUM 10 MG PO TABS: 10.0000 mg | ORAL_TABLET | Freq: Every day | ORAL | 5 refills | Status: DC

## 2017-08-25 MED ORDER — OMEGA-3-ACID ETHYL ESTERS 1 G PO CAPS: 2.0000 g | ORAL_CAPSULE | Freq: Two times a day (BID) | ORAL | 5 refills | Status: DC

## 2017-08-25 NOTE — Progress Notes (Signed)
Subjective:  Patient ID: Stacie [name], female    DOB: 1955-04-02  Age: 62 y.o. MRN: [id number]  CC: Follow-up (2 mo f/u for anxiety and depression. pt is taking 2 daily. doesnt notice much diffrent. FYI: pt has not have MM done yet, Colonoscopy done in [place] 3 years ago. ) and Insomnia (unable to sleep for 3-4 due to life situation. )  HPI  Anxiety and Depression: No improvement with lexapro. Feels more anxious due to grandson's health. interfering with sleep. Denies SI or HI. Depression screen [facility] 2/9 06/18/2017 06/04/2017 05/25/2016  Decreased Interest 2 0 0  Down, Depressed, Hopeless 2 0 0  PHQ - 2 Score 4 0 0  Altered sleeping 0 - -  Tired, decreased energy 0 - -  Change in appetite 3 - -  Feeling bad or failure about yourself  2 - -  Trouble concentrating 2 - -  Moving slowly or fidgety/restless 3 - -  Suicidal thoughts 0 - -  PHQ-9 Score 14 - -  Difficult doing work/chores Somewhat difficult - -   Has been out of levothyroxine for 3weeks.  HTN: Improved with amlodipine. BP Readings from Last 3 Encounters:  08/25/17 120/72  06/18/17 136/84  06/04/17 136/80    hyperlipidemia: Unable to tolerate crestor (nausea).  Reviewed past Medical, Social and Family history today.  Outpatient Medications Prior to Visit  Medication Sig Dispense Refill  . amLODipine (NORVASC) 10 MG tablet Take 1 tablet (10 mg total) by mouth daily. 90 tablet 3  . Doxylamine Succinate, Sleep, (UNISOM PO) Take by mouth. Help sleep    . escitalopram (LEXAPRO) 20 MG tablet Take 1 tablet (20 mg total) by mouth daily. 30 tablet 5  . levothyroxine (SYNTHROID, LEVOTHROID) 25 MCG tablet Take 1 tablet (25 mcg total) by mouth daily before breakfast. 30 tablet 2  . rosuvastatin (CRESTOR) 20 MG tablet Take 1 tablet (20 mg total) by mouth daily. (Patient not taking: Reported on 08/25/2017) 90 tablet 1  . omega-3 acid ethyl esters (LOVAZA) 1 g capsule Take 2 capsules (2 g total) by mouth 2 (two) times daily.  (Patient not taking: Reported on 08/25/2017) 120 capsule 5   No facility-administered medications prior to visit.     ROS See HPI  Objective:  BP 120/72   Pulse 74   Temp 98.4 F (36.9 C) (Oral)   Ht 5\' 5"  (1.651 m)   Wt 94 lb (42.6 kg)   SpO2 98%   BMI 15.64 kg/m   BP Readings from Last 3 Encounters:  08/25/17 120/72  06/18/17 136/84  06/04/17 136/80    Wt Readings from Last 3 Encounters:  08/25/17 94 lb (42.6 kg)  06/18/17 93 lb 12.8 oz (42.5 kg)  06/04/17 94 lb 9.6 oz (42.9 kg)    Physical Exam  Constitutional: She is oriented to person, place, and time.  Cardiovascular: Normal rate.  Pulmonary/Chest: Effort normal.  Neurological: She is alert and oriented to person, place, and time.  Psychiatric: Her speech is normal. Her mood appears anxious. She is agitated. Cognition and memory are normal. She expresses no suicidal plans and no homicidal plans.  Vitals reviewed.   Lab Results  Component Value Date   WBC 9.2 06/04/2017   HGB 14.1 06/04/2017   HCT 42.3 06/04/2017   PLT 369 06/04/2017   GLUCOSE 94 06/04/2017   CHOL 253 (H) 06/04/2017   TRIG 188 (H) 06/04/2017   HDL 55 06/04/2017   LDLCALC 164 (H) 06/04/2017   ALT 9  06/04/2017   AST 16 06/04/2017   NA 142 06/04/2017   K 4.1 06/04/2017   CL 102 06/04/2017   CREATININE 0.74 06/04/2017   BUN 15 06/04/2017   CO2 30 06/04/2017   TSH 5.76 (H) 06/04/2017    No results found.  Assessment & Plan:   Stacie [name] was seen today for follow-up and insomnia.  Diagnoses and all orders for this visit:  Hypothyroidism, unspecified type -     levothyroxine (SYNTHROID, LEVOTHROID) 25 MCG tablet; Take 1 tablet (25 mcg total) by mouth daily before breakfast.  GAD (generalized anxiety disorder) -     Ambulatory referral to Psychology -     mirtazapine (REMERON) 7.5 MG tablet; Take 1 tablet (7.5 mg total) by mouth at bedtime. -     escitalopram (LEXAPRO) 20 MG tablet; Take 1 tablet (20 mg total) by mouth  daily.  Recurrent major depressive disorder, in full remission ([place]) -     Ambulatory referral to Psychology -     mirtazapine (REMERON) 7.5 MG tablet; Take 1 tablet (7.5 mg total) by mouth at bedtime. -     escitalopram (LEXAPRO) 20 MG tablet; Take 1 tablet (20 mg total) by mouth daily.  Mixed hyperlipidemia -     omega-3 acid ethyl esters (LOVAZA) 1 g capsule; Take 2 capsules (2 g total) by mouth 2 (two) times daily.   I have discontinued Stacie [name]'s (Doxylamine Succinate, Sleep, (UNISOM PO)). I am also having her start on mirtazapine. Additionally, I am having her maintain her amLODipine, rosuvastatin, escitalopram, levothyroxine, and omega-3 acid ethyl esters.  Meds ordered this encounter  Medications  . mirtazapine (REMERON) 7.5 MG tablet    Sig: Take 1 tablet (7.5 mg total) by mouth at bedtime.    Dispense:  30 tablet    Refill:  5    Order Specific Question:   Supervising Provider    Answer:   Lucille Passy [[id number]]  . escitalopram (LEXAPRO) 20 MG tablet    Sig: Take 1 tablet (20 mg total) by mouth daily.    Dispense:  30 tablet    Refill:  5    Order Specific Question:   Supervising Provider    Answer:   Lucille Passy [[id number]]  . levothyroxine (SYNTHROID, LEVOTHROID) 25 MCG tablet    Sig: Take 1 tablet (25 mcg total) by mouth daily before breakfast.    Dispense:  90 tablet    Refill:  3    Order Specific Question:   Supervising Provider    Answer:   Lucille Passy [[id number]]  . omega-3 acid ethyl esters (LOVAZA) 1 g capsule    Sig: Take 2 capsules (2 g total) by mouth 2 (two) times daily.    Dispense:  120 capsule    Refill:  5    Order Specific Question:   Supervising Provider    Answer:   Lucille Passy [[id number]]   Follow-up: Return in about 1 month (around 09/22/2017) for anxiety, depression (need repeat BMP TSH, free T4).  Wilfred Lacy, NP

## 2017-08-25 NOTE — Patient Instructions (Addendum)
Stop crestor. Continue lovaza.  Resume levothyroixine 5mins before breakfast.  Start pravastatin, remeron and amlodipine at bedtime.  Take lexapro and lovaza in morning after breakfast. You will be contacted to schedule appt with psychologist.   Living With Anxiety After being diagnosed with an anxiety disorder, you may be relieved to know why you have felt or behaved a certain way. It is natural to also feel overwhelmed about the treatment ahead and what it will mean for your life. With care and support, you can manage this condition and recover from it. How to cope with anxiety Dealing with stress Stress is your body's reaction to life changes and events, both good and bad. Stress can last just a few hours or it can be ongoing. Stress can play a major role in anxiety, so it is important to learn both how to cope with stress and how to think about it differently. Talk with your health care provider or a counselor to learn more about stress reduction. He or she may suggest some stress reduction techniques, such as:  Music therapy. This can include creating or listening to music that you enjoy and that inspires you.  Mindfulness-based meditation. This involves being aware of your normal breaths, rather than trying to control your breathing. It can be done while sitting or walking.  Centering prayer. This is a kind of meditation that involves focusing on a word, phrase, or sacred image that is meaningful to you and that brings you peace.  Deep breathing. To do this, expand your stomach and inhale slowly through your nose. Hold your breath for 3-5 seconds. Then exhale slowly, allowing your stomach muscles to relax.  Self-talk. This is a skill where you identify thought patterns that lead to anxiety reactions and correct those thoughts.  Muscle relaxation. This involves tensing muscles then relaxing them.  Choose a stress reduction technique that fits your lifestyle and personality. Stress  reduction techniques take time and practice. Set aside 5-15 minutes a day to do them. Therapists can offer training in these techniques. The training may be covered by some insurance plans. Other things you can do to manage stress include:  Keeping a stress diary. This can help you learn what triggers your stress and ways to control your response.  Thinking about how you respond to certain situations. You may not be able to control everything, but you can control your reaction.  Making time for activities that help you relax, and not feeling guilty about spending your time in this way.  Therapy combined with coping and stress-reduction skills provides the best chance for successful treatment. Medicines Medicines can help ease symptoms. Medicines for anxiety include:  Anti-anxiety drugs.  Antidepressants.  Beta-blockers.  Medicines may be used as the main treatment for anxiety disorder, along with therapy, or if other treatments are not working. Medicines should be prescribed by a health care provider. Relationships Relationships can play a big part in helping you recover. Try to spend more time connecting with trusted friends and family members. Consider going to couples counseling, taking family education classes, or going to family therapy. Therapy can help you and others better understand the condition. How to recognize changes in your condition Everyone has a different response to treatment for anxiety. Recovery from anxiety happens when symptoms decrease and stop interfering with your daily activities at home or work. This may mean that you will start to:  Have better concentration and focus.  Sleep better.  Be less irritable.  Have more  energy.  Have improved memory.  It is important to recognize when your condition is getting worse. Contact your health care provider if your symptoms interfere with home or work and you do not feel like your condition is improving. Where to  find help and support: You can get help and support from these sources:  Self-help groups.  Online and [employer].  A trusted spiritual leader.  Couples counseling.  Family education classes.  Family therapy.  Follow these instructions at home:  Eat a healthy diet that includes plenty of vegetables, fruits, whole grains, low-fat dairy products, and lean protein. Do not eat a lot of foods that are high in solid fats, added sugars, or salt.  Exercise. Most adults should do the following: ? Exercise for at least 150 minutes each week. The exercise should increase your heart rate and make you sweat (moderate-intensity exercise). ? Strengthening exercises at least twice a week.  Cut down on caffeine, tobacco, alcohol, and other potentially harmful substances.  Get the right amount and quality of sleep. Most adults need 7-9 hours of sleep each night.  Make choices that simplify your life.  Take over-the-counter and prescription medicines only as told by your health care provider.  Avoid caffeine, alcohol, and certain over-the-counter cold medicines. These may make you feel worse. Ask your pharmacist which medicines to avoid.  Keep all follow-up visits as told by your health care provider. This is important. Questions to ask your health care provider  Would I benefit from therapy?  How often should I follow up with a health care provider?  How long do I need to take medicine?  Are there any long-term side effects of my medicine?  Are there any alternatives to taking medicine? Contact a health care provider if:  You have a hard time staying focused or finishing daily tasks.  You spend many hours a day feeling worried about everyday life.  You become exhausted by worry.  You start to have headaches, feel tense, or have nausea.  You urinate more than normal.  You have diarrhea. Get help right away if:  You have a racing heart and shortness of  breath.  You have thoughts of hurting yourself or others. If you ever feel like you may hurt yourself or others, or have thoughts about taking your own life, get help right away. You can go to your nearest emergency department or call:  Your local emergency services (911 in the [place]  A suicide crisis helpline, such as the [facility] at 617-156-2561. This is open 24-hours a day.  Summary  Taking steps to deal with stress can help calm you.  Medicines cannot cure anxiety disorders, but they can help ease symptoms.  Family, friends, and partners can play a big part in helping you recover from an anxiety disorder. This information is not intended to replace advice given to you by your health care provider. Make sure you discuss any questions you have with your health care provider. Document Released: 01/14/2016 Document Revised: 01/14/2016 Document Reviewed: 01/14/2016 Elsevier Interactive Patient Education  [employer].

## 2017-08-26 ENCOUNTER — Encounter: Payer: Self-pay | Admitting: Nurse Practitioner

## 2017-09-27 ENCOUNTER — Ambulatory Visit: Payer: Medicare Other | Admitting: Nurse Practitioner

## 2017-10-18 ENCOUNTER — Encounter: Payer: Self-pay | Admitting: Nurse Practitioner

## 2017-10-18 ENCOUNTER — Ambulatory Visit (INDEPENDENT_AMBULATORY_CARE_PROVIDER_SITE_OTHER): Payer: Medicare Other | Admitting: Nurse Practitioner

## 2017-10-18 VITALS — BP 130/70 | HR 73 | Temp 98.4°F | Ht 65.0 in | Wt 97.6 lb

## 2017-10-18 DIAGNOSIS — E039 Hypothyroidism, unspecified: Secondary | ICD-10-CM

## 2017-10-18 DIAGNOSIS — E782 Mixed hyperlipidemia: Secondary | ICD-10-CM

## 2017-10-18 DIAGNOSIS — I10 Essential (primary) hypertension: Secondary | ICD-10-CM

## 2017-10-18 DIAGNOSIS — F3342 Major depressive disorder, recurrent, in full remission: Secondary | ICD-10-CM

## 2017-10-18 DIAGNOSIS — F411 Generalized anxiety disorder: Secondary | ICD-10-CM

## 2017-10-18 MED ORDER — MIRTAZAPINE 7.5 MG PO TABS: 7.5000 mg | ORAL_TABLET | Freq: Every day | ORAL | 3 refills | Status: DC

## 2017-10-18 MED ORDER — OMEGA-3-ACID ETHYL ESTERS 1 G PO CAPS: 2.0000 g | ORAL_CAPSULE | Freq: Two times a day (BID) | ORAL | 11 refills | Status: DC

## 2017-10-18 MED ORDER — ESCITALOPRAM OXALATE 20 MG PO TABS: 20.0000 mg | ORAL_TABLET | Freq: Every day | ORAL | 3 refills | Status: DC

## 2017-10-18 MED ORDER — PRAVASTATIN SODIUM 10 MG PO TABS: 10.0000 mg | ORAL_TABLET | Freq: Every day | ORAL | 3 refills | Status: DC

## 2017-10-18 MED ORDER — AMLODIPINE BESYLATE 10 MG PO TABS: 10.0000 mg | ORAL_TABLET | Freq: Every day | ORAL | 3 refills | Status: DC

## 2017-10-18 NOTE — Progress Notes (Signed)
Subjective:  Patient ID: Stacie [name], female    DOB: 11/29/1955  Age: 62 y.o. MRN: [id number]  CC: Follow-up (anxiety, hypothyroidism, HTN)  HPI  HTN: controlled with amlodipine. BP Readings from Last 3 Encounters:  10/18/17 130/70  08/25/17 120/72  06/18/17 136/84   Hypothyroidism: Needs repeat TSH and T4. Current use of levothyroxine 89mcg Continuous weight gain. Wt Readings from Last 3 Encounters:  10/18/17 97 lb 9.6 oz (44.3 kg)  08/25/17 94 lb (42.6 kg)  06/18/17 93 lb 12.8 oz (42.5 kg)   Anxiety: Improved mood and sleep. Current use of remeron and lexapro. Depression screen [facility] 2/9 10/18/2017 06/18/2017 06/04/2017  Decreased Interest 0 2 0  Down, Depressed, Hopeless 0 2 0  PHQ - 2 Score 0 4 0  Altered sleeping 0 0 -  Tired, decreased energy 0 0 -  Change in appetite 0 3 -  Feeling bad or failure about yourself  0 2 -  Trouble concentrating 1 2 -  Moving slowly or fidgety/restless 0 3 -  Suicidal thoughts 0 0 -  PHQ-9 Score 1 14 -  Difficult doing work/chores - Somewhat difficult -   GAD 7 : Generalized Anxiety Score 10/18/2017 06/18/2017  Nervous, Anxious, on Edge 0 3  Control/stop worrying 0 3  Worry too much - different things 0 3  Trouble relaxing 0 2  Restless 1 3  Easily annoyed or irritable 0 1  Afraid - awful might happen 0 3  Total GAD 7 Score 1 18  Anxiety Difficulty - Somewhat difficult   Reviewed past Medical, Social and Family history today.  Outpatient Medications Prior to Visit  Medication Sig Dispense Refill  . amLODipine (NORVASC) 10 MG tablet Take 1 tablet (10 mg total) by mouth daily. 90 tablet 3  . escitalopram (LEXAPRO) 20 MG tablet Take 1 tablet (20 mg total) by mouth daily. 30 tablet 5  . levothyroxine (SYNTHROID, LEVOTHROID) 25 MCG tablet Take 1 tablet (25 mcg total) by mouth daily before breakfast. 90 tablet 3  . mirtazapine (REMERON) 7.5 MG tablet Take 1 tablet (7.5 mg total) by mouth at bedtime. 30 tablet 5  . omega-3 acid ethyl  esters (LOVAZA) 1 g capsule Take 2 capsules (2 g total) by mouth 2 (two) times daily. 120 capsule 5  . pravastatin (PRAVACHOL) 10 MG tablet Take 1 tablet (10 mg total) by mouth at bedtime. 30 tablet 5   No facility-administered medications prior to visit.     ROS See HPI  Objective:  BP 130/70   Pulse 73   Temp 98.4 F (36.9 C) (Oral)   Ht 5\' 5"  (1.651 m)   Wt 97 lb 9.6 oz (44.3 kg)   SpO2 96%   BMI 16.24 kg/m   BP Readings from Last 3 Encounters:  10/18/17 130/70  08/25/17 120/72  06/18/17 136/84    Wt Readings from Last 3 Encounters:  10/18/17 97 lb 9.6 oz (44.3 kg)  08/25/17 94 lb (42.6 kg)  06/18/17 93 lb 12.8 oz (42.5 kg)    Physical Exam  Constitutional: She is oriented to person, place, and time. She appears well-developed and well-nourished.  Neck: No thyromegaly present.  Cardiovascular: Normal rate and regular rhythm.  Pulmonary/Chest: Effort normal and breath sounds normal.  Lymphadenopathy:    She has no cervical adenopathy.  Neurological: She is alert and oriented to person, place, and time.  Psychiatric: She has a normal mood and affect. Her behavior is normal. Thought content normal.  Vitals reviewed.  Lab Results  Component Value Date   WBC 9.2 06/04/2017   HGB 14.1 06/04/2017   HCT 42.3 06/04/2017   PLT 369 06/04/2017   GLUCOSE 110 (H) 10/18/2017   CHOL 253 (H) 06/04/2017   TRIG 188 (H) 06/04/2017   HDL 55 06/04/2017   LDLCALC 164 (H) 06/04/2017   ALT 9 06/04/2017   AST 16 06/04/2017   NA 141 10/18/2017   K 5.2 (H) 10/18/2017   CL 104 10/18/2017   CREATININE 0.66 10/18/2017   BUN 26 (H) 10/18/2017   CO2 27 10/18/2017   TSH 2.97 10/18/2017    Assessment & Plan:   Stacie [name] was seen today for follow-up.  Diagnoses and all orders for this visit:  Essential hypertension -     Basic metabolic panel -     amLODipine (NORVASC) 10 MG tablet; Take 1 tablet (10 mg total) by mouth daily.  Hypothyroidism, unspecified type -     TSH -      T4, free -     levothyroxine (SYNTHROID, LEVOTHROID) 25 MCG tablet; Take 1 tablet (25 mcg total) by mouth daily before breakfast.  GAD (generalized anxiety disorder) -     escitalopram (LEXAPRO) 20 MG tablet; Take 1 tablet (20 mg total) by mouth daily. -     mirtazapine (REMERON) 7.5 MG tablet; Take 1 tablet (7.5 mg total) by mouth at bedtime.  Recurrent major depressive disorder, in full remission ([place]) -     escitalopram (LEXAPRO) 20 MG tablet; Take 1 tablet (20 mg total) by mouth daily. -     mirtazapine (REMERON) 7.5 MG tablet; Take 1 tablet (7.5 mg total) by mouth at bedtime.  Mixed hyperlipidemia -     pravastatin (PRAVACHOL) 10 MG tablet; Take 1 tablet (10 mg total) by mouth at bedtime. -     omega-3 acid ethyl esters (LOVAZA) 1 g capsule; Take 2 capsules (2 g total) by mouth 2 (two) times daily.   I am having Stacie [name] maintain her amLODipine, escitalopram, mirtazapine, pravastatin, omega-3 acid ethyl esters, and levothyroxine.  Meds ordered this encounter  Medications  . amLODipine (NORVASC) 10 MG tablet    Sig: Take 1 tablet (10 mg total) by mouth daily.    Dispense:  90 tablet    Refill:  3    Order Specific Question:   Supervising Provider    Answer:   Lucille Passy [[id number]]  . escitalopram (LEXAPRO) 20 MG tablet    Sig: Take 1 tablet (20 mg total) by mouth daily.    Dispense:  90 tablet    Refill:  3    Order Specific Question:   Supervising Provider    Answer:   Lucille Passy [[id number]]  . mirtazapine (REMERON) 7.5 MG tablet    Sig: Take 1 tablet (7.5 mg total) by mouth at bedtime.    Dispense:  90 tablet    Refill:  3    Order Specific Question:   Supervising Provider    Answer:   Lucille Passy [[id number]]  . pravastatin (PRAVACHOL) 10 MG tablet    Sig: Take 1 tablet (10 mg total) by mouth at bedtime.    Dispense:  90 tablet    Refill:  3    Order Specific Question:   Supervising Provider    Answer:   Lucille Passy [[id number]]  . omega-3 acid ethyl esters (LOVAZA) 1  g capsule    Sig: Take 2 capsules (2 g total) by mouth 2 (two) times  daily.    Dispense:  120 capsule    Refill:  11    Order Specific Question:   Supervising Provider    Answer:   Lucille Passy [[id number]]  . levothyroxine (SYNTHROID, LEVOTHROID) 25 MCG tablet    Sig: Take 1 tablet (25 mcg total) by mouth daily before breakfast.    Dispense:  90 tablet    Refill:  3    Order Specific Question:   Supervising Provider    Answer:   Zigmund Daniel, CODY [[id number]]    Follow-up: Return in about 6 months (around 04/18/2018) for HTN, , hyperlipidemia, hypothyroxine (fasting).  Wilfred Lacy, NP

## 2017-10-18 NOTE — Patient Instructions (Addendum)
Improved thyroid function. Continue current dose of levothyroxine. BMP indicates elevated potassium. Stop any potassium supplement and avoid potassium rich food: potatoes, dark green vegatables, banana, oranges etc. Push adequate oral hydration. F/up as discussed

## 2017-10-19 ENCOUNTER — Encounter: Payer: Self-pay | Admitting: Nurse Practitioner

## 2017-10-19 LAB — T4, FREE: Free T4: 0.61 ng/dL (ref 0.60–1.60)

## 2017-10-19 LAB — BASIC METABOLIC PANEL
BUN: 26 mg/dL — ABNORMAL HIGH (ref 6–23)
CHLORIDE: 104 meq/L (ref 96–112)
CO2: 27 meq/L (ref 19–32)
Calcium: 9.4 mg/dL (ref 8.4–10.5)
Creatinine, Ser: 0.66 mg/dL (ref 0.40–1.20)
GFR: 96.34 mL/min (ref >60.00)
GLUCOSE: 110 mg/dL — AB (ref 70–99)
POTASSIUM: 5.2 meq/L — AB (ref 3.5–5.1)
SODIUM: 141 meq/L (ref 135–145)

## 2017-10-19 LAB — TSH: TSH: 2.97 u[IU]/mL (ref 0.35–4.50)

## 2017-10-19 MED ORDER — LEVOTHYROXINE SODIUM 25 MCG PO TABS: 25.0000 ug | ORAL_TABLET | Freq: Every day | ORAL | 3 refills | Status: DC

## 2017-11-26 ENCOUNTER — Ambulatory Visit (HOSPITAL_BASED_OUTPATIENT_CLINIC_OR_DEPARTMENT_OTHER)
Admission: RE | Admit: 2017-11-26 | Discharge: 2017-11-26 | Disposition: A | Discharge status: Home / Self Care | Payer: Medicare Other | Source: Ambulatory Visit | Attending: Nurse Practitioner | Admitting: Nurse Practitioner

## 2017-11-26 ENCOUNTER — Encounter: Payer: Self-pay | Admitting: Nurse Practitioner

## 2017-11-26 ENCOUNTER — Ambulatory Visit: Payer: Medicare Other | Admitting: Nurse Practitioner

## 2017-11-26 VITALS — BP 164/88 | HR 88 | Temp 98.6°F | Ht 65.0 in | Wt 97.8 lb

## 2017-11-26 DIAGNOSIS — I739 Peripheral vascular disease, unspecified: Secondary | ICD-10-CM | POA: Diagnosis not present

## 2017-11-26 DIAGNOSIS — F17209 Nicotine dependence, unspecified, with unspecified nicotine-induced disorders: Secondary | ICD-10-CM | POA: Diagnosis not present

## 2017-11-26 DIAGNOSIS — M79661 Pain in right lower leg: Secondary | ICD-10-CM | POA: Diagnosis not present

## 2017-11-26 MED ORDER — ASPIRIN EC 81 MG PO TBEC: 81.0000 mg | DELAYED_RELEASE_TABLET | Freq: Every day | ORAL | Status: DC

## 2017-11-26 NOTE — Patient Instructions (Addendum)
Go to [facility] for right leg ultrasound to rule out DVT: [place], 1st floor Radiology department.  Encourage to stop tobacco use.  Negative for DVT. Entered order for arterial study. Start ASA 81mg  EC once a day.   [place] confirm moderate peripheral artery disease. Highly recommend discontinuation of tobacco use Need to change pravastatin to lipitor which is more potent. Start pletal for intermittent pain Entered referral to vascular. Need to make f/up appt in 27month

## 2017-11-26 NOTE — Progress Notes (Addendum)
Subjective:  Patient ID: Stacie [name], female    DOB: October 02, 1955  Age: 62 y.o. MRN: [id number]  CC: Leg Pain (patient is complaining of right lower leg is painful to walk, left leg is swelling. denied any injury. refill on remeron?)  Leg Pain   The incident occurred more than 1 week ago. There was no injury mechanism. The pain is present in the right leg. The quality of the pain is described as cramping and aching. The pain has been fluctuating since onset. Associated symptoms include numbness and tingling. Pertinent negatives include no inability to bear weight, loss of motion, loss of sensation or muscle weakness. She reports no foreign bodies present. The symptoms are aggravated by movement and weight bearing. She has tried rest for the symptoms.  onset of pain with walking short distance.  Reviewed past Medical, Social and Family history today.  Outpatient Medications Prior to Visit  Medication Sig Dispense Refill  . amLODipine (NORVASC) 10 MG tablet Take 1 tablet (10 mg total) by mouth daily. 90 tablet 3  . escitalopram (LEXAPRO) 20 MG tablet Take 1 tablet (20 mg total) by mouth daily. 90 tablet 3  . levothyroxine (SYNTHROID, LEVOTHROID) 25 MCG tablet Take 1 tablet (25 mcg total) by mouth daily before breakfast. 90 tablet 3  . mirtazapine (REMERON) 7.5 MG tablet Take 1 tablet (7.5 mg total) by mouth at bedtime. 90 tablet 3  . omega-3 acid ethyl esters (LOVAZA) 1 g capsule Take 2 capsules (2 g total) by mouth 2 (two) times daily. 120 capsule 11  . pravastatin (PRAVACHOL) 10 MG tablet Take 1 tablet (10 mg total) by mouth at bedtime. 90 tablet 3   No facility-administered medications prior to visit.     ROS See HPI  Objective:  BP (!) 164/88   Pulse 88   Temp 98.6 F (37 C) (Oral)   Ht 5\' 5"  (1.651 m)   Wt 97 lb 12.8 oz (44.4 kg)   SpO2 97%   BMI 16.27 kg/m   BP Readings from Last 3 Encounters:  11/26/17 (!) 164/88  10/18/17 130/70  08/25/17 120/72    Wt Readings from  Last 3 Encounters:  11/26/17 97 lb 12.8 oz (44.4 kg)  10/18/17 97 lb 9.6 oz (44.3 kg)  08/25/17 94 lb (42.6 kg)    Physical Exam  Constitutional: She is oriented to person, place, and time. She appears well-developed and well-nourished.  Cardiovascular: Normal rate and intact distal pulses.  Pulses:      Femoral pulses are 0 on the right side. Pulmonary/Chest: Effort normal.  Musculoskeletal: She exhibits no edema or tenderness.  Neurological: She is alert and oriented to person, place, and time.  Skin: No erythema.  Right toes are cold to touch  Psychiatric: She has a normal mood and affect. Her behavior is normal.  Vitals reviewed.   Lab Results  Component Value Date   WBC 9.2 06/04/2017   HGB 14.1 06/04/2017   HCT 42.3 06/04/2017   PLT 369 06/04/2017   GLUCOSE 110 (H) 10/18/2017   CHOL 253 (H) 06/04/2017   TRIG 188 (H) 06/04/2017   HDL 55 06/04/2017   LDLCALC 164 (H) 06/04/2017   ALT 9 06/04/2017   AST 16 06/04/2017   NA 141 10/18/2017   K 5.2 (H) 10/18/2017   CL 104 10/18/2017   CREATININE 0.66 10/18/2017   BUN 26 (H) 10/18/2017   CO2 27 10/18/2017   TSH 2.97 10/18/2017    Assessment & Plan:   Stacie [name]  was seen today for leg pain.  Diagnoses and all orders for this visit:  Claudication of right lower extremity ([place]) -     US Venous Img Lower Unilateral Right; Future -     Cancel: VAS [place] ABI WITH/WO TBI; Future -     aspirin EC 81 MG tablet; Take 1 tablet (81 mg total) by mouth daily. -     VAS [place] LE ART SEG MULTI (Segm&LE Reynauds); Future -     cilostazol (PLETAL) 50 MG tablet; Take 0.5 tablets (25 mg total) by mouth 2 (two) times daily.  Tobacco use disorder, continuous -     aspirin EC 81 MG tablet; Take 1 tablet (81 mg total) by mouth daily.  PAD (peripheral artery disease) (H[facility]) -     cilostazol (PLETAL) 50 MG tablet; Take 0.5 tablets (25 mg total) by mouth 2 (two) times daily. -     atorvastatin (LIPITOR) 40 MG tablet; Take 1 tablet (40 mg total) by  mouth daily. -     Ambulatory referral to Vascular Surgery   I have discontinued Stacie [name]'s pravastatin. I am also having her start on aspirin EC, cilostazol, and atorvastatin. Additionally, I am having her maintain her amLODipine, escitalopram, mirtazapine, omega-3 acid ethyl esters, and levothyroxine.  Meds ordered this encounter  Medications  . aspirin EC 81 MG tablet    Sig: Take 1 tablet (81 mg total) by mouth daily.    Order Specific Question:   Supervising Provider    Answer:   MATTHEWS, CODY [[id number]]  . cilostazol (PLETAL) 50 MG tablet    Sig: Take 0.5 tablets (25 mg total) by mouth 2 (two) times daily.    Dispense:  30 tablet    Refill:  2    Order Specific Question:   Supervising Provider    Answer:   Lucille Passy [[id number]]  . atorvastatin (LIPITOR) 40 MG tablet    Sig: Take 1 tablet (40 mg total) by mouth daily.    Dispense:  30 tablet    Refill:  5    Order Specific Question:   Supervising Provider    Answer:   Lucille Passy [[id number]]   Follow-up: Return in about 4 weeks (around 12/24/2017) for hyperlipidemia and PAD.  Wilfred Lacy, NP

## 2017-12-03 ENCOUNTER — Ambulatory Visit (HOSPITAL_COMMUNITY)
Admission: RE | Admit: 2017-12-03 | Discharge: 2017-12-03 | Disposition: A | Discharge status: Home / Self Care | Payer: Medicare Other | Source: Ambulatory Visit | Attending: Cardiovascular Disease | Admitting: Cardiovascular Disease

## 2017-12-03 DIAGNOSIS — I739 Peripheral vascular disease, unspecified: Secondary | ICD-10-CM | POA: Insufficient documentation

## 2017-12-06 ENCOUNTER — Encounter: Payer: Self-pay | Admitting: Nurse Practitioner

## 2017-12-06 DIAGNOSIS — I739 Peripheral vascular disease, unspecified: Secondary | ICD-10-CM | POA: Insufficient documentation

## 2017-12-06 MED ORDER — CILOSTAZOL 50 MG PO TABS: 25.0000 mg | ORAL_TABLET | Freq: Two times a day (BID) | ORAL | 2 refills | Status: DC

## 2017-12-06 MED ORDER — ATORVASTATIN CALCIUM 40 MG PO TABS: 40.0000 mg | ORAL_TABLET | Freq: Every day | ORAL | 5 refills | Status: DC

## 2017-12-06 NOTE — Addendum Note (Signed)
Addended by: Wilfred Lacy L on: 12/06/2017 05:07 PM   Modules accepted: Orders

## 2017-12-09 ENCOUNTER — Ambulatory Visit (HOSPITAL_BASED_OUTPATIENT_CLINIC_OR_DEPARTMENT_OTHER): Payer: Medicare Other

## 2017-12-14 ENCOUNTER — Other Ambulatory Visit: Payer: Self-pay | Admitting: Nurse Practitioner

## 2017-12-14 DIAGNOSIS — E782 Mixed hyperlipidemia: Secondary | ICD-10-CM

## 2018-01-07 ENCOUNTER — Ambulatory Visit: Payer: Medicare Other | Admitting: Nurse Practitioner

## 2018-02-08 ENCOUNTER — Encounter: Payer: Medicare Other | Admitting: Vascular Surgery

## 2018-03-15 ENCOUNTER — Encounter: Payer: Medicare Other | Admitting: Vascular Surgery

## 2018-04-14 DIAGNOSIS — W19XXXA Unspecified fall, initial encounter: Secondary | ICD-10-CM | POA: Diagnosis not present

## 2018-04-14 DIAGNOSIS — I959 Hypotension, unspecified: Secondary | ICD-10-CM | POA: Diagnosis not present

## 2018-04-14 DIAGNOSIS — S0990XA Unspecified injury of head, initial encounter: Secondary | ICD-10-CM | POA: Diagnosis not present

## 2018-04-19 ENCOUNTER — Encounter: Payer: Medicare Other | Admitting: Vascular Surgery

## 2018-04-19 ENCOUNTER — Ambulatory Visit: Payer: Medicare Other | Admitting: Nurse Practitioner

## 2018-05-06 ENCOUNTER — Telehealth: Payer: Self-pay | Admitting: Nurse Practitioner

## 2018-05-06 NOTE — Telephone Encounter (Signed)
Called to see if patient wanted to reschedule her appt she canceled on 04/19/2018 for a webex, left message

## 2018-06-03 ENCOUNTER — Telehealth: Payer: Self-pay | Admitting: Nurse Practitioner

## 2018-06-03 NOTE — Telephone Encounter (Signed)
Called and talked with patient. She states that she does not need a virtual visit at this time. She is aware that if she does need one that she will call and set one up.

## 2018-06-16 ENCOUNTER — Encounter: Payer: Self-pay | Admitting: Nurse Practitioner

## 2018-06-16 ENCOUNTER — Ambulatory Visit: Payer: Self-pay | Admitting: Nurse Practitioner

## 2018-06-16 ENCOUNTER — Ambulatory Visit (INDEPENDENT_AMBULATORY_CARE_PROVIDER_SITE_OTHER): Payer: Medicare Other | Admitting: Nurse Practitioner

## 2018-06-16 VITALS — Ht 65.0 in | Wt 90.0 lb

## 2018-06-16 DIAGNOSIS — R634 Abnormal weight loss: Secondary | ICD-10-CM

## 2018-06-16 DIAGNOSIS — R197 Diarrhea, unspecified: Secondary | ICD-10-CM | POA: Diagnosis not present

## 2018-06-16 DIAGNOSIS — I10 Essential (primary) hypertension: Secondary | ICD-10-CM | POA: Diagnosis not present

## 2018-06-16 NOTE — Progress Notes (Signed)
Virtual Visit via Video Note  I connected with Stacie [name] on 06/16/18 at  3:00 PM EDT by a video enabled telemedicine application and verified that I am speaking with the correct person using two identifiers.  Location: Patient: Home Provider: Office   I discussed the limitations of evaluation and management by telemedicine and the availability of in person appointments. The patient expressed understanding and agreed to proceed.  CC: watery diarrhea x 4weeks, unable to tolerate imodium (caused nausea and vomiting)  History of Present Illness: Diarrhea   This is a new problem. The current episode started 1 to 4 weeks ago. The problem occurs 5 to 10 times per day. The problem has been unchanged. The stool consistency is described as watery. The patient states that diarrhea awakens her from sleep. Associated symptoms include increased flatus and weight loss. Pertinent negatives include no abdominal pain, arthralgias, bloating, chills, coughing, fever, headaches, myalgias, sweats, URI or vomiting. There are no known risk factors. She has tried anti-motility drug for the symptoms. The treatment provided no relief. There is no history of bowel resection, malabsorption, a recent abdominal surgery or short gut syndrome. hx of colitis while in [place], but unsure about type  no recent travel, no known sick contact, no oral abx in last 8months.  no change in appetite. Normal food intake, but has Bm within 3hrs of food intake (not related to any particular type of food) Unsure of last colonoscopy.  Wt Readings from Last 3 Encounters:  06/16/18 90 lb (40.8 kg)  11/26/17 97 lb 12.8 oz (44.4 kg)  10/18/17 97 lb 9.6 oz (44.3 kg)    Observations/Objective: Physical Exam  Constitutional: She is oriented to person, place, and time. No distress.  Pulmonary/Chest: Effort normal.  Abdominal: She exhibits no distension.  Neurological: She is alert and oriented to person, place, and time.  Psychiatric:  She has a normal mood and affect. Her behavior is normal. Thought content normal.   Assessment and Plan: Anaysha was seen today for diarrhea.  Diagnoses and all orders for this visit:  Essential hypertension -     Comprehensive metabolic panel; Future  Diarrhea, unspecified type -     Stool Culture; Future -     Cancel: Stool, WBC/Lactoferrin; Future -     Cancel: Calprotectin, Fecal; Future -     CBC w/Diff; Future -     Comprehensive metabolic panel; Future -     C. difficile GDH and Toxin A/B; Future -     Stool, WBC/Lactoferrin; Future  Weight loss, unintentional -     TSH; Future -     T3, free; Future -     T4, free; Future   Follow Up Instructions: Go to lab for stool collection and blood draw. Maintain adequate oral hydration with water and gartorade, avoid diary and greasy foods. May use peptobismol if needed Avoid use of imodium May also use IBD guard or florastor or curturelle (OTC) 1cap once a day   I discussed the assessment and treatment plan with the patient. The patient was provided an opportunity to ask questions and all were answered. The patient agreed with the plan and demonstrated an understanding of the instructions.   The patient was advised to call back or seek an in-person evaluation if the symptoms worsen or if the condition fails to improve as anticipated.   Wilfred Lacy, NP

## 2018-06-16 NOTE — Patient Instructions (Addendum)
Go to lab for stool collection and blood draw. Maintain adequate oral hydration with water and gartorade, avoid diary and greasy foods. May use peptobismol if needed Avoid use of imodium May also use IBD guard or florastor or curturelle (OTC) 1cap once a day.  Diarrhea, Adult Diarrhea is when you pass loose and watery poop (stool) often. Diarrhea can make you feel weak and cause you to lose water in your body (get dehydrated). Losing water in your body can cause you to:  Feel tired and thirsty.  Have a dry mouth.  Go pee (urinate) less often. Diarrhea often lasts 2-3 days. However, it can last longer if it is a sign of something more serious. It is important to treat your diarrhea as told by your doctor. Follow these instructions at home: Eating and drinking     Follow these instructions as told by your doctor:  Take an ORS (oral rehydration solution). This is a drink that helps you replace fluids and minerals your body lost. It is sold at pharmacies and stores.  Drink plenty of fluids, such as: ? Water. ? Ice chips. ? Diluted fruit juice. ? Low-calorie sports drinks. ? Milk, if you want.  Avoid drinking fluids that have a lot of sugar or caffeine in them.  Eat bland, easy-to-digest foods in small amounts as you are able. These foods include: ? Bananas. ? Applesauce. ? Rice. ? Low-fat (lean) meats. ? Toast. ? Crackers.  Avoid alcohol.  Avoid spicy or fatty foods.  Medicines  Take over-the-counter and prescription medicines only as told by your doctor.  If you were prescribed an antibiotic medicine, take it as told by your doctor. Do not stop using the antibiotic even if you start to feel better. General instructions   Wash your hands often using soap and water. If soap and water are not available, use a hand sanitizer. Others in your home should wash their hands as well. Hands should be washed: ? After using the toilet or changing a diaper. ? Before preparing,  cooking, or serving food. ? While caring for a sick person. ? While visiting someone in a hospital.  Drink enough fluid to keep your pee (urine) pale yellow.  Rest at home while you get better.  Watch your condition for any changes.  Take a warm bath to help with any burning or pain from having diarrhea.  Keep all follow-up visits as told by your doctor. This is important. Contact a doctor if:  You have a fever.  Your diarrhea gets worse.  You have new symptoms.  You cannot keep fluids down.  You feel light-headed or dizzy.  You have a headache.  You have muscle cramps. Get help right away if:  You have chest pain.  You feel very weak or you pass out (faint).  You have bloody or black poop or poop that looks like tar.  You have very bad pain, cramping, or bloating in your belly (abdomen).  You have trouble breathing or you are breathing very quickly.  Your heart is beating very quickly.  Your skin feels cold and clammy.  You feel confused.  You have signs of losing too much water in your body, such as: ? Dark pee, very little pee, or no pee. ? Cracked lips. ? Dry mouth. ? Sunken eyes. ? Sleepiness. ? Weakness. Summary  Diarrhea is when you pass loose and watery poop (stool) often.  Diarrhea can make you feel weak and cause you to lose water in  your body (get dehydrated).  Take an ORS (oral rehydration solution). This is a drink that is sold at pharmacies and stores.  Eat bland, easy-to-digest foods in small amounts as you are able.  Contact a doctor if your condition gets worse. Get help right away if you have signs that you have lost too much water in your body. This information is not intended to replace advice given to you by your health care provider. Make sure you discuss any questions you have with your health care provider. Document Released: 07/08/2007 Document Revised: 06/25/2017 Document Reviewed: 06/25/2017 Elsevier Interactive Patient  Education  2019 [employer].

## 2018-06-16 NOTE — Telephone Encounter (Signed)
Pt called in c/o having explosive, water diarrhea for 4 weeks.   She vomited 4 times today after taking Imodium for the diarrhea.   Anything she eats is going straight through her.   She is drinking water and juice.  See triage notes.  I warm transferred her call to Elmyra Ricks in the [employer] office to be scheduled a virtual visit (COVID-19 pandemic) with Wilfred Lacy, NP.  I sent these notes to the office.   Reason for Disposition . [1] SEVERE diarrhea (e.g., 7 or more times / day more than normal) AND [2] age > 60 years  Answer Assessment - Initial Assessment Questions 1. DIARRHEA SEVERITY: "How bad is the diarrhea?" "How many extra stools have you had in the past 24 hours than normal?"    - NO DIARRHEA (SCALE 0)   - MILD (SCALE 1-3): Few loose or mushy BMs; increase of 1-3 stools over normal daily number of stools; mild increase in ostomy output.   -  MODERATE (SCALE 4-7): Increase of 4-6 stools daily over normal; moderate increase in ostomy output. * SEVERE (SCALE 8-10; OR 'WORST POSSIBLE'): Increase of 7 or more stools daily over normal; moderate increase in ostomy output; incontinence.     I've had bad diarrhea for 4 weeks.   I have colitis.   I took Imodium and it came up.     Vomited 4 times today.    No vomiting prior to today.   2. ONSET: "When did the diarrhea begin?"      4 weeks.    It started and did not stop.    I have a history of colitis.   It's never been this bad.    3. BM CONSISTENCY: "How loose or watery is the diarrhea?"      Watery and explosive 4. VOMITING: "Are you also vomiting?" If so, ask: "How many times in the past 24 hours?"      Yes today 4 times. 5. ABDOMINAL PAIN: "Are you having any abdominal pain?" If yes: "What does it feel like?" (e.g., crampy, dull, intermittent, constant)      No pain.   A little cramping when I need to go. 6. ABDOMINAL PAIN SEVERITY: If present, ask: "How bad is the pain?"  (e.g., Scale 1-10; mild, moderate, or severe)   -  MILD (1-3): doesn't interfere with normal activities, abdomen soft and not tender to touch    - MODERATE (4-7): interferes with normal activities or awakens from sleep, tender to touch    - SEVERE (8-10): excruciating pain, doubled over, unable to do any normal activities       Mild 7. ORAL INTAKE: If vomiting, "Have you been able to drink liquids?" "How much fluids have you had in the past 24 hours?"     I'm drinking cran-grape juice.  Drinking water.    8. HYDRATION: "Any signs of dehydration?" (e.g., dry mouth [not just dry lips], too weak to stand, dizziness, new weight loss) "When did you last urinate?"     I feel weak.   I've lost 10 lbs.     9. EXPOSURE: "Have you traveled to a foreign country recently?" "Have you been exposed to anyone with diarrhea?" "Could you have eaten any food that was spoiled?"     No travels 10. ANTIBIOTIC USE: "Are you taking antibiotics now or have you taken antibiotics in the past 2 months?"       No 11. OTHER SYMPTOMS: "Do you have any other symptoms?" (  e.g., fever, blood in stool)       No blood.   No fever. 12. PREGNANCY: "Is there any chance you are pregnant?" "When was your last menstrual period?"       Not asked due to age  Protocols used: DIARRHEA-A-AH

## 2018-06-17 ENCOUNTER — Other Ambulatory Visit (INDEPENDENT_AMBULATORY_CARE_PROVIDER_SITE_OTHER): Payer: Medicare Other

## 2018-06-17 DIAGNOSIS — R197 Diarrhea, unspecified: Secondary | ICD-10-CM

## 2018-06-17 DIAGNOSIS — R634 Abnormal weight loss: Secondary | ICD-10-CM | POA: Diagnosis not present

## 2018-06-17 DIAGNOSIS — I10 Essential (primary) hypertension: Secondary | ICD-10-CM

## 2018-06-18 LAB — COMPREHENSIVE METABOLIC PANEL
AG Ratio: 1.5 (calc) (ref 1.0–2.5)
ALT: 11 U/L (ref 6–29)
AST: 18 U/L (ref 10–35)
Albumin: 4.2 g/dL (ref 3.6–5.1)
Alkaline phosphatase (APISO): 97 U/L (ref 37–153)
BUN: 11 mg/dL (ref 7–25)
CO2: 28 mmol/L (ref 20–32)
Calcium: 9.7 mg/dL (ref 8.6–10.4)
Chloride: 101 mmol/L (ref 98–110)
Creat: 0.89 mg/dL (ref 0.50–0.99)
Globulin: 2.8 g/dL (calc) (ref 1.9–3.7)
Glucose, Bld: 96 mg/dL (ref 65–99)
Potassium: 4.6 mmol/L (ref 3.5–5.3)
Sodium: 139 mmol/L (ref 135–146)
Total Bilirubin: 0.5 mg/dL (ref 0.2–1.2)
Total Protein: 7 g/dL (ref 6.1–8.1)

## 2018-06-18 LAB — T4, FREE: Free T4: 1.2 ng/dL (ref 0.8–1.8)

## 2018-06-18 LAB — CBC WITH DIFFERENTIAL/PLATELET
Absolute Monocytes: 778 cells/uL (ref 200–950)
Basophils Absolute: 22 cells/uL (ref 0–200)
Basophils Relative: 0.3 %
Eosinophils Absolute: 58 cells/uL (ref 15–500)
Eosinophils Relative: 0.8 %
HCT: 44.3 % (ref 35.0–45.0)
Hemoglobin: 14.6 g/dL (ref 11.7–15.5)
Lymphs Abs: 1699 cells/uL (ref 850–3900)
MCH: 29.6 pg (ref 27.0–33.0)
MCHC: 33 g/dL (ref 32.0–36.0)
MCV: 89.9 fL (ref 80.0–100.0)
MPV: 9.7 fL (ref 7.5–12.5)
Monocytes Relative: 10.8 %
Neutro Abs: 4644 cells/uL (ref 1500–7800)
Neutrophils Relative %: 64.5 %
Platelets: 442 10*3/uL — ABNORMAL HIGH (ref 140–400)
RBC: 4.93 10*6/uL (ref 3.80–5.10)
RDW: 15 % (ref 11.0–15.0)
Total Lymphocyte: 23.6 %
WBC: 7.2 10*3/uL (ref 3.8–10.8)

## 2018-06-18 LAB — T3, FREE: T3, Free: 2.9 pg/mL (ref 2.3–4.2)

## 2018-06-18 LAB — TSH: TSH: 1.44 mIU/L (ref 0.40–4.50)

## 2018-07-27 ENCOUNTER — Other Ambulatory Visit: Payer: Self-pay

## 2018-07-27 ENCOUNTER — Encounter: Payer: Self-pay | Admitting: Nurse Practitioner

## 2018-07-27 ENCOUNTER — Ambulatory Visit (INDEPENDENT_AMBULATORY_CARE_PROVIDER_SITE_OTHER): Payer: Medicare Other | Admitting: Nurse Practitioner

## 2018-07-27 VITALS — BP 126/70 | HR 81 | Temp 98.1°F | Ht 65.0 in | Wt 86.6 lb

## 2018-07-27 DIAGNOSIS — R197 Diarrhea, unspecified: Secondary | ICD-10-CM

## 2018-07-27 MED ORDER — CHOLESTYRAMINE 4 GM/DOSE PO POWD: 2.0000 g | Freq: Two times a day (BID) | ORAL | 0 refills | Status: DC

## 2018-07-27 NOTE — Progress Notes (Signed)
Subjective:  Patient ID: Stacie [name], female    DOB: Jul 01, 1955  Age: 63 y.o. MRN: [id number]  CC: Diarrhea (pt is c/o of watery diarrhea,mostly at night/didnt bring stool kit back yet. )  Diarrhea  This is a recurrent problem. The current episode started more than 1 month ago. The problem occurs 2 to 4 times per day. The problem has been unchanged. The stool consistency is described as watery. Associated symptoms include weight loss. Pertinent negatives include no abdominal pain, bloating, chills, fever, increased  flatus, myalgias, sweats, URI or vomiting. Nothing aggravates the symptoms. There are no known risk factors. She has tried anti-motility drug for the symptoms. The treatment provided no relief. Her past medical history is significant for irritable bowel syndrome.  discontinued levothyroxine, pletal, remeron, and fish oil thinking this may be related to symptoms.  Reviewed past Medical, Social and Family history today.  Outpatient Medications Prior to Visit  Medication Sig Dispense Refill  . amLODipine (NORVASC) 10 MG tablet Take 1 tablet (10 mg total) by mouth daily. 90 tablet 3  . aspirin EC 81 MG tablet Take 1 tablet (81 mg total) by mouth daily.    Marland Kitchen atorvastatin (LIPITOR) 40 MG tablet Take 1 tablet (40 mg total) by mouth daily. 30 tablet 5  . escitalopram (LEXAPRO) 20 MG tablet Take 1 tablet (20 mg total) by mouth daily. 90 tablet 3  . cilostazol (PLETAL) 50 MG tablet Take 0.5 tablets (25 mg total) by mouth 2 (two) times daily. (Patient not taking: Reported on 06/16/2018) 30 tablet 2  . levothyroxine (SYNTHROID, LEVOTHROID) 25 MCG tablet Take 1 tablet (25 mcg total) by mouth daily before breakfast. (Patient not taking: Reported on 06/16/2018) 90 tablet 3  . mirtazapine (REMERON) 7.5 MG tablet Take 1 tablet (7.5 mg total) by mouth at bedtime. (Patient not taking: Reported on 06/16/2018) 90 tablet 3  . omega-3 acid ethyl esters (LOVAZA) 1 g capsule Take 2 capsules (2 g total) by  mouth 2 (two) times daily. (Patient not taking: Reported on 06/16/2018) 120 capsule 11   No facility-administered medications prior to visit.     ROS See HPI  Objective:  BP 126/70   Pulse 81   Temp 98.1 F (36.7 C) (Oral)   Ht 5' 5"  (1.651 m)   Wt 86 lb 9.6 oz (39.3 kg)   SpO2 97%   BMI 14.41 kg/m   BP Readings from Last 3 Encounters:  07/27/18 126/70  11/26/17 (!) 164/88  10/18/17 130/70    Wt Readings from Last 3 Encounters:  07/27/18 86 lb 9.6 oz (39.3 kg)  06/16/18 90 lb (40.8 kg)  11/26/17 97 lb 12.8 oz (44.4 kg)    Physical Exam Vitals signs reviewed.  Constitutional:      General: She is not in acute distress. Cardiovascular:     Rate and Rhythm: Normal rate and regular rhythm.     Pulses: Normal pulses.     Heart sounds: Normal heart sounds.  Pulmonary:     Effort: Pulmonary effort is normal.  Abdominal:     General: Abdomen is flat.     Palpations: Abdomen is soft. There is no mass.     Tenderness: There is no abdominal tenderness. There is no guarding.  Musculoskeletal:     Right lower leg: No edema.     Left lower leg: No edema.  Neurological:     Mental Status: She is alert and oriented to person, place, and time.    Lab Results  Component Value Date   WBC 7.2 06/17/2018   HGB 14.6 06/17/2018   HCT 44.3 06/17/2018   PLT 442 (H) 06/17/2018   GLUCOSE 96 06/17/2018   CHOL 253 (H) 06/04/2017   TRIG 188 (H) 06/04/2017   HDL 55 06/04/2017   LDLCALC 164 (H) 06/04/2017   ALT 11 06/17/2018   AST 18 06/17/2018   NA 139 06/17/2018   K 4.6 06/17/2018   CL 101 06/17/2018   CREATININE 0.89 06/17/2018   BUN 11 06/17/2018   CO2 28 06/17/2018   TSH 1.44 06/17/2018    Vas [place] Le Art Seg Multi (segm&le Reynauds)  Result Date: 12/04/2017 LOWER EXTREMITY DOPPLER STUDY Indications: Claudication, and Patient complains of right calf pain after              walking 50 feet. This has been going on for the past four weeks. High Risk Factors: Hypertension,  hyperlipidemia, past history of smoking.  Performing Technologist: Wilkie Aye RVT  Examination Guidelines: A complete evaluation includes at minimum, Doppler waveform signals and systolic blood pressure reading at the level of bilateral brachial, anterior tibial, and posterior tibial arteries, when vessel segments are accessible. Bilateral testing is considered an integral part of a complete examination. Photoelectric Plethysmograph (PPG) waveforms and toe systolic pressure readings are included as required and additional duplex testing as needed. Limited examinations for reoccurring indications may be performed as noted.  ABI Findings: +---------+------------------+-----+----------+--------+ Right    Rt Pressure (mmHg)IndexWaveform  Comment  +---------+------------------+-----+----------+--------+ Brachial 151                                       +---------+------------------+-----+----------+--------+ CFA                             triphasic          +---------+------------------+-----+----------+--------+ Popliteal                       monophasic         +---------+------------------+-----+----------+--------+ ATA      102               0.68 monophasic         +---------+------------------+-----+----------+--------+ PTA      77                0.51 monophasic         +---------+------------------+-----+----------+--------+ PERO     104               0.69 monophasic         +---------+------------------+-----+----------+--------+ Great Toe60                0.40 Abnormal           +---------+------------------+-----+----------+--------+ +---------+------------------+-----+---------+-------+ Left     Lt Pressure (mmHg)IndexWaveform Comment +---------+------------------+-----+---------+-------+ Brachial 151                                     +---------+------------------+-----+---------+-------+ CFA                             triphasic         +---------+------------------+-----+---------+-------+ Popliteal  triphasic        +---------+------------------+-----+---------+-------+ ATA      185               1.23 triphasic        +---------+------------------+-----+---------+-------+ PTA      160               1.06 triphasic        +---------+------------------+-----+---------+-------+ PERO     184               1.22 triphasic        +---------+------------------+-----+---------+-------+ Great Toe148               0.98 Normal           +---------+------------------+-----+---------+-------+ +-------+-----------+-----------+------------+------------+ ABI/TBIToday's ABIToday's TBIPrevious ABIPrevious TBI +-------+-----------+-----------+------------+------------+ Right  0.69       0.40                                +-------+-----------+-----------+------------+------------+ Left   1.23       0.98                                +-------+-----------+-----------+------------+------------+  Summary: Right: 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 The right toe-brachial index is abnormal. Left: Resting left ankle-brachial index is within normal range. No evidence of significant left lower extremity arterial disease. The left toe-brachial index is normal.  *See table(s) above for measurements and observations.  Vascular consult recommended. Suggest lower arterial duplex. Electronically signed by Ida Rogue MD on 12/04/2017 at 5:20:37 PM.    Final     Assessment & Plan:   Julie-Ann was seen today for diarrhea.  Diagnoses and all orders for this visit:  Diarrhea, unspecified type -     Clostridium difficile culture-fecal; Future -     CT ABDOMEN PELVIS W CONTRAST; Future -     cholestyramine (QUESTRAN) 4 GM/DOSE powder; Take 0.5 packets (2 g total) by mouth 2 (two) times daily with a meal.   I am having Telford Nab start on  cholestyramine. I am also having her maintain her amLODipine, escitalopram, mirtazapine, omega-3 acid ethyl esters, levothyroxine, aspirin EC, cilostazol, and atorvastatin.  Meds ordered this encounter  Medications  . cholestyramine (QUESTRAN) 4 GM/DOSE powder    Sig: Take 0.5 packets (2 g total) by mouth 2 (two) times daily with a meal.    Dispense:  378 g    Refill:  0    Order Specific Question:   Supervising Provider    Answer:   Lucille Passy [[id number]]    Problem List Items Addressed This Visit    None    Visit Diagnoses    Diarrhea, unspecified type    -  Primary   Relevant Medications   cholestyramine (QUESTRAN) 4 GM/DOSE powder   Other Relevant Orders   Clostridium difficile culture-fecal   CT ABDOMEN PELVIS W CONTRAST       Follow-up: Return if symptoms worsen or fail to improve.  Wilfred Lacy, NP

## 2018-07-27 NOTE — Patient Instructions (Addendum)
You will be contacted to schedule appt for CT ABD/pelvis. Return stool sample to lab as soon as possible.  Resume thyroid medication.  Hold lipitor. Continue amlodipine and escitalopram  Diarrhea, Adult Diarrhea is when you pass loose and watery poop (stool) often. Diarrhea can make you feel weak and cause you to lose water in your body (get dehydrated). Losing water in your body can cause you to:  Feel tired and thirsty.  Have a dry mouth.  Go pee (urinate) less often. Diarrhea often lasts 2-3 days. However, it can last longer if it is a sign of something more serious. It is important to treat your diarrhea as told by your doctor. Follow these instructions at home: Eating and drinking     Follow these instructions as told by your doctor:  Take an ORS (oral rehydration solution). This is a drink that helps you replace fluids and minerals your body lost. It is sold at pharmacies and stores.  Drink plenty of fluids, such as: ? Water. ? Ice chips. ? Diluted fruit juice. ? Low-calorie sports drinks. ? Milk, if you want.  Avoid drinking fluids that have a lot of sugar or caffeine in them.  Eat bland, easy-to-digest foods in small amounts as you are able. These foods include: ? Bananas. ? Applesauce. ? Rice. ? Low-fat (lean) meats. ? Toast. ? Crackers.  Avoid alcohol.  Avoid spicy or fatty foods.  Medicines  Take over-the-counter and prescription medicines only as told by your doctor.  If you were prescribed an antibiotic medicine, take it as told by your doctor. Do not stop using the antibiotic even if you start to feel better. General instructions   Wash your hands often using soap and water. If soap and water are not available, use a hand sanitizer. Others in your home should wash their hands as well. Hands should be washed: ? After using the toilet or changing a diaper. ? Before preparing, cooking, or serving food. ? While caring for a sick person. ? While  visiting someone in a hospital.  Drink enough fluid to keep your pee (urine) pale yellow.  Rest at home while you get better.  Watch your condition for any changes.  Take a warm bath to help with any burning or pain from having diarrhea.  Keep all follow-up visits as told by your doctor. This is important. Contact a doctor if:  You have a fever.  Your diarrhea gets worse.  You have new symptoms.  You cannot keep fluids down.  You feel light-headed or dizzy.  You have a headache.  You have muscle cramps. Get help right away if:  You have chest pain.  You feel very weak or you pass out (faint).  You have bloody or black poop or poop that looks like tar.  You have very bad pain, cramping, or bloating in your belly (abdomen).  You have trouble breathing or you are breathing very quickly.  Your heart is beating very quickly.  Your skin feels cold and clammy.  You feel confused.  You have signs of losing too much water in your body, such as: ? Dark pee, very little pee, or no pee. ? Cracked lips. ? Dry mouth. ? Sunken eyes. ? Sleepiness. ? Weakness. Summary  Diarrhea is when you pass loose and watery poop (stool) often.  Diarrhea can make you feel weak and cause you to lose water in your body (get dehydrated).  Take an ORS (oral rehydration solution). This is a drink  that is sold at pharmacies and stores.  Eat bland, easy-to-digest foods in small amounts as you are able.  Contact a doctor if your condition gets worse. Get help right away if you have signs that you have lost too much water in your body. This information is not intended to replace advice given to you by your health care provider. Make sure you discuss any questions you have with your health care provider. Document Released: 07/08/2007 Document Revised: 06/25/2017 Document Reviewed: 06/25/2017 Elsevier Interactive Patient Education  2019 [employer].

## 2018-07-28 ENCOUNTER — Encounter: Payer: Self-pay | Admitting: Nurse Practitioner

## 2018-07-28 NOTE — Addendum Note (Signed)
Addended by: Wilfred Lacy L on: 07/28/2018 11:38 AM   Modules accepted: Orders

## 2018-07-29 ENCOUNTER — Other Ambulatory Visit (INDEPENDENT_AMBULATORY_CARE_PROVIDER_SITE_OTHER): Payer: Medicare Other

## 2018-07-29 DIAGNOSIS — R197 Diarrhea, unspecified: Secondary | ICD-10-CM | POA: Diagnosis not present

## 2018-07-29 DIAGNOSIS — R634 Abnormal weight loss: Secondary | ICD-10-CM

## 2018-08-01 ENCOUNTER — Telehealth: Payer: Self-pay | Admitting: Nurse Practitioner

## 2018-08-01 DIAGNOSIS — R197 Diarrhea, unspecified: Secondary | ICD-10-CM

## 2018-08-01 NOTE — Telephone Encounter (Signed)
Lab appt made at 2pm on 08/03/2018.

## 2018-08-01 NOTE — Telephone Encounter (Signed)
LVM for the pt to call back, need to inform her of the new test we add on.

## 2018-08-01 NOTE — Telephone Encounter (Signed)
-----   Message from [place] sent at 08/01/2018  9:44 AM EDT ----- Regarding: Lab work Hello Dr. Lorayne Marek,   Ms. Kopper will need an updated BUN/Creatinine prior to [place] scheduling her CT appt. Could you please put the order in for lab work and  have someone at your office call the patient and schedule the appointment, once we see the lab work has been done we will call the patient and schedule her for her CT.   Thank you! Destiny - [facility]

## 2018-08-03 ENCOUNTER — Other Ambulatory Visit (INDEPENDENT_AMBULATORY_CARE_PROVIDER_SITE_OTHER): Payer: Medicare Other

## 2018-08-03 DIAGNOSIS — R197 Diarrhea, unspecified: Secondary | ICD-10-CM

## 2018-08-03 LAB — FECAL LACTOFERRIN, QUANT
Fecal Lactoferrin: POSITIVE — AB
MICRO NUMBER:: 611994
SPECIMEN QUALITY:: ADEQUATE

## 2018-08-03 LAB — STOOL CULTURE
MICRO NUMBER:: 612313
MICRO NUMBER:: 612314
MICRO NUMBER:: 612315
SHIGA RESULT:: NOT DETECTED
SPECIMEN QUALITY:: ADEQUATE
SPECIMEN QUALITY:: ADEQUATE
SPECIMEN QUALITY:: ADEQUATE

## 2018-08-03 LAB — CLOSTRIDIUM DIFFICILE CULTURE-FECAL

## 2018-08-03 NOTE — Addendum Note (Signed)
Addended by: Lynnea Ferrier on: 08/03/2018 02:11 PM   Modules accepted: Orders

## 2018-08-04 LAB — BASIC METABOLIC PANEL
BUN: 12 mg/dL (ref 6–23)
CO2: 25 mEq/L (ref 19–32)
Calcium: 9.1 mg/dL (ref 8.4–10.5)
Chloride: 102 mEq/L (ref 96–112)
Creatinine, Ser: 0.85 mg/dL (ref 0.40–1.20)
GFR: 67.52 mL/min (ref >60.00)
Glucose, Bld: 93 mg/dL (ref 70–99)
Potassium: 4.2 mEq/L (ref 3.5–5.1)
Sodium: 138 mEq/L (ref 135–145)

## 2018-08-04 NOTE — Addendum Note (Signed)
Addended by: Leana Gamer on: 08/04/2018 07:58 AM   Modules accepted: Orders

## 2018-08-09 ENCOUNTER — Telehealth: Payer: Self-pay | Admitting: Nurse Practitioner

## 2018-08-09 NOTE — Telephone Encounter (Signed)
Pt is aware of test result.  

## 2018-08-09 NOTE — Telephone Encounter (Signed)
Pt returned call to receive lab results. NT unavailable. Please return call.   Copied from [place] [phone]. Topic: Quick Communication - Lab Results (Clinic Use ONLY) >> Aug 04, 2018 12:21 PM Noitamyae, Phetcharat, LPN wrote: Called patient to inform them of 08/04/2018 lab results. When patient returns call, triage nurse may disclose results.

## 2018-08-18 ENCOUNTER — Other Ambulatory Visit: Payer: Self-pay

## 2018-08-18 ENCOUNTER — Ambulatory Visit (HOSPITAL_BASED_OUTPATIENT_CLINIC_OR_DEPARTMENT_OTHER)
Admission: RE | Admit: 2018-08-18 | Discharge: 2018-08-18 | Disposition: A | Discharge status: Home / Self Care | Payer: Medicare Other | Source: Ambulatory Visit | Attending: Nurse Practitioner | Admitting: Nurse Practitioner

## 2018-08-18 ENCOUNTER — Encounter (HOSPITAL_BASED_OUTPATIENT_CLINIC_OR_DEPARTMENT_OTHER): Payer: Self-pay

## 2018-08-18 DIAGNOSIS — I700 Atherosclerosis of aorta: Secondary | ICD-10-CM | POA: Insufficient documentation

## 2018-08-18 DIAGNOSIS — R197 Diarrhea, unspecified: Secondary | ICD-10-CM | POA: Diagnosis not present

## 2018-08-18 MED ORDER — IOHEXOL 300 MG/ML  SOLN
100.0000 mL | Freq: Once | INTRAMUSCULAR | Status: AC | PRN
  Administered 2018-08-18: 100 mL via INTRAVENOUS

## 2018-08-24 ENCOUNTER — Telehealth: Payer: Self-pay

## 2018-08-24 NOTE — Telephone Encounter (Signed)
Covid-19 screening questions:  Do you now or have you had a fever in the last 14 days?   no  Do you have any respiratory symptoms of shortness of breath or cough now or in the last 14 days?   No  Do you have any family members or close contacts with diagnosed or suspected Covid-19 in the past 14 days?  no  Have you been tested for Covid-19 and found to be positive?  No  Pt answered NO to all questions. She was told to wear a mask and not bring a visitor unless she needs assistance.

## 2018-08-25 ENCOUNTER — Encounter: Payer: Self-pay | Admitting: Gastroenterology

## 2018-08-25 ENCOUNTER — Telehealth: Payer: Self-pay | Admitting: Gastroenterology

## 2018-08-25 ENCOUNTER — Ambulatory Visit: Payer: Medicare Other | Admitting: Gastroenterology

## 2018-08-25 VITALS — BP 126/60 | HR 83 | Temp 98.2°F | Ht 65.0 in | Wt 84.4 lb

## 2018-08-25 DIAGNOSIS — K529 Noninfective gastroenteritis and colitis, unspecified: Secondary | ICD-10-CM

## 2018-08-25 DIAGNOSIS — R634 Abnormal weight loss: Secondary | ICD-10-CM

## 2018-08-25 MED ORDER — DIPHENOXYLATE-ATROPINE 2.5-0.025 MG PO TABS: 1.0000 | ORAL_TABLET | Freq: Four times a day (QID) | ORAL | 1 refills | Status: DC | PRN

## 2018-08-25 MED ORDER — NA SULFATE-K SULFATE-MG SULF 17.5-3.13-1.6 GM/177ML PO SOLN: ORAL | 0 refills | Status: DC

## 2018-08-25 NOTE — Progress Notes (Signed)
08/25/2018 Stacie [name] [id number] 07-13-1955   HISTORY OF PRESENT ILLNESS:  This is a a 63 year old female who is new to our office.  She has been referred here by her PCP, Wilfred Lacy, NP for evaluation regarding diarrhea and weight loss.  The patient tells me that she was diagnosed with "colitis" in [place] in 2014, but was never put on any medication for it.  She says that she believes they never started anything because it was mild at that time or because she did not really have any symptoms.  Now she has been having severe diarrhea for the past 4 months.  She has also lost about 20 pounds since that time.  She says that sometimes she will have diarrhea 15-20 times a day.  She has been using Imodium, which helps slightly but not completely.  She also smokes marijuana multiple times a day because that is the only thing that seems to calm her stomach.  She denies seeing blood in her stools.  She denies any abdominal pain.  Stool culture and stool for C. difficile are negative.  Stool for white blood cells/lactoferrin was positive.  CBC, CMP, and TSH were within normal limits.   Past Medical History:  Diagnosis Date  . Colitis   . Hyperlipidemia   . Hypertension   . Stroke ([place]) 04/2012   unknown type  . Thyroid disease    History reviewed. No pertinent surgical history.  reports that she has been smoking. She has a 2.50 pack-year smoking history. She has never used smokeless tobacco. She reports current alcohol use of about 14.0 standard drinks of alcohol per week. She reports current drug use. Frequency: 7.00 times per week. Drug: Marijuana. family history includes Heart disease in her maternal grandmother, paternal grandfather, and paternal uncle; Heart disease (age of onset: 25) in her father; Heart disease (age of onset: 33) in her sister; Stroke (age of onset: 38) in her mother. Allergies  Allergen Reactions  . Trazodone And Nefazodone Nausea And Vomiting  . Wellbutrin  [Bupropion] Nausea And Vomiting      Outpatient Encounter Medications as of 08/25/2018  Medication Sig  . aspirin EC 81 MG tablet Take 1 tablet (81 mg total) by mouth daily.  Marland Kitchen doxylamine, Sleep, (UNISOM) 25 MG tablet Take 25 mg by mouth at bedtime as needed.  Marland Kitchen escitalopram (LEXAPRO) 20 MG tablet Take 1 tablet (20 mg total) by mouth daily.  Marland Kitchen amLODipine (NORVASC) 10 MG tablet Take 1 tablet (10 mg total) by mouth daily. (Patient not taking: Reported on 08/25/2018)  . atorvastatin (LIPITOR) 40 MG tablet Take 1 tablet (40 mg total) by mouth daily. (Patient not taking: Reported on 08/25/2018)  . levothyroxine (SYNTHROID, LEVOTHROID) 25 MCG tablet Take 1 tablet (25 mcg total) by mouth daily before breakfast. (Patient not taking: Reported on 06/16/2018)  . mirtazapine (REMERON) 7.5 MG tablet Take 1 tablet (7.5 mg total) by mouth at bedtime. (Patient not taking: Reported on 06/16/2018)  . [DISCONTINUED] cholestyramine (QUESTRAN) 4 GM/DOSE powder Take 0.5 packets (2 g total) by mouth 2 (two) times daily with a meal.  . [DISCONTINUED] cilostazol (PLETAL) 50 MG tablet Take 0.5 tablets (25 mg total) by mouth 2 (two) times daily. (Patient not taking: Reported on 06/16/2018)  . [DISCONTINUED] omega-3 acid ethyl esters (LOVAZA) 1 g capsule Take 2 capsules (2 g total) by mouth 2 (two) times daily. (Patient not taking: Reported on 06/16/2018)   No facility-administered encounter medications on file as of 08/25/2018.  REVIEW OF SYSTEMS  : All other systems reviewed and negative except where noted in the History of Present Illness.   PHYSICAL EXAM: BP 126/60   Pulse 83   Temp 98.2 F (36.8 C)   Ht 5\' 5"  (1.651 m)   Wt 84 lb 6.4 oz (38.3 kg)   BMI 14.04 kg/m  General: Extremely thin white female in no acute distress Head: Normocephalic and atraumatic Eyes:  Sclerae anicteric, conjunctiva pink. Ears: Normal auditory acuity Lungs: Clear throughout to auscultation; no increased WOB. Heart: Regular rate  and rhythm; no M/R/G. Abdomen: Soft, non-distended.  BS present and hyperactive.  Non-tender. Rectal:  Will be done at the time of colonoscopy. Musculoskeletal: Symmetrical with no gross deformities  Skin: No lesions on visible extremities Extremities: No edema  Neurological: Alert oriented x 4, grossly non-focal Psychological:  Alert and cooperative. Normal mood and affect  ASSESSMENT AND PLAN: *63 year old female with reported history of "colitis" in 2014 but never on any medication.  Now with severe diarrhea and weight loss for 4 months.  We are going to request records for comparison, but nonetheless, I think that she needs repeat colonoscopy.  Labs look good and stool studies were negative.  Imodium has not been working well so we will try Lomotil in the interim.  We will schedule for colonoscopy with Dr. Loletha Carrow.  **The risks, benefits, and alternatives were discussed with the patient and she consents to proceed.   CC:  Nche, Charlene Brooke, NP

## 2018-08-25 NOTE — Patient Instructions (Signed)
If you are age 63 or older, your body mass index should be between 23-30. Your Body mass index is 14.04 kg/m. If this is out of the aforementioned range listed, please consider follow up with your Primary Care Provider.  If you are age 53 or younger, your body mass index should be between 19-25. Your Body mass index is 14.04 kg/m. If this is out of the aformentioned range listed, please consider follow up with your Primary Care Provider.   You have been scheduled for a colonoscopy. Please follow written instructions given to you at your visit today.  Please pick up your prep supplies at the pharmacy within the next 1-3 days. If you use inhalers (even only as needed), please bring them with you on the day of your procedure. Your physician has requested that you go to www.startemmi.com and enter the access code given to you at your visit today. This web site gives a general overview about your procedure. However, you should still follow specific instructions given to you by our office regarding your preparation for the procedure.  We have sent the following medications to your pharmacy for you to pick up at your convenience: Suprep Lomotil  We are requesting colonoscopy and pathology reports.  Thank you for choosing me and [facility].   Alonza Bogus, PA-C

## 2018-08-26 ENCOUNTER — Encounter: Payer: Self-pay | Admitting: Gastroenterology

## 2018-08-26 DIAGNOSIS — K529 Noninfective gastroenteritis and colitis, unspecified: Secondary | ICD-10-CM | POA: Insufficient documentation

## 2018-08-26 DIAGNOSIS — R634 Abnormal weight loss: Secondary | ICD-10-CM | POA: Insufficient documentation

## 2018-08-26 NOTE — Progress Notes (Signed)
____________________________________________________________  Attending physician addendum:  Thank you for sending this case to me. I have reviewed the entire note, and the outlined plan seems appropriate.  Happy to help - looks like I had first available procedure slot next week.  Wilfrid Lund, MD  ____________________________________________________________

## 2018-08-31 ENCOUNTER — Telehealth: Payer: Self-pay | Admitting: Gastroenterology

## 2018-08-31 NOTE — Telephone Encounter (Signed)

## 2018-08-31 NOTE — Telephone Encounter (Signed)
Pt returned and answered "No" to all the Covid-19 screening questions

## 2018-09-01 ENCOUNTER — Other Ambulatory Visit: Payer: Self-pay

## 2018-09-01 ENCOUNTER — Ambulatory Visit (AMBULATORY_SURGERY_CENTER): Payer: Medicare Other | Admitting: Gastroenterology

## 2018-09-01 ENCOUNTER — Encounter: Payer: Self-pay | Admitting: Gastroenterology

## 2018-09-01 ENCOUNTER — Other Ambulatory Visit: Payer: Self-pay | Admitting: Gastroenterology

## 2018-09-01 VITALS — BP 159/71 | HR 67 | Temp 97.6°F | Resp 16 | Ht 65.0 in | Wt 85.0 lb

## 2018-09-01 DIAGNOSIS — R634 Abnormal weight loss: Secondary | ICD-10-CM

## 2018-09-01 DIAGNOSIS — K52831 Collagenous colitis: Secondary | ICD-10-CM

## 2018-09-01 DIAGNOSIS — R197 Diarrhea, unspecified: Secondary | ICD-10-CM | POA: Diagnosis not present

## 2018-09-01 DIAGNOSIS — K529 Noninfective gastroenteritis and colitis, unspecified: Secondary | ICD-10-CM

## 2018-09-01 MED ORDER — SODIUM CHLORIDE 0.9 % IV SOLN: 500.0000 mL | Freq: Once | INTRAVENOUS | Status: DC

## 2018-09-01 MED ORDER — DIPHENOXYLATE-ATROPINE 2.5-0.025 MG PO TABS: 1.0000 | ORAL_TABLET | Freq: Four times a day (QID) | ORAL | 1 refills | Status: DC | PRN

## 2018-09-01 NOTE — Patient Instructions (Signed)
YOU HAD AN ENDOSCOPIC PROCEDURE TODAY AT THE [facility]:   Refer to the procedure report that was given to you for any specific questions about what was found during the examination.  If the procedure report does not answer your questions, please call your gastroenterologist to clarify.  If you requested that your care partner not be given the details of your procedure findings, then the procedure report has been included in a sealed envelope for you to review at your convenience later.  YOU SHOULD EXPECT: Some feelings of bloating in the abdomen. Passage of more gas than usual.  Walking can help get rid of the air that was put into your GI tract during the procedure and reduce the bloating. If you had a lower endoscopy (such as a colonoscopy or flexible sigmoidoscopy) you may notice spotting of blood in your stool or on the toilet paper. If you underwent a bowel prep for your procedure, you may not have a normal bowel movement for a few days.  Please Note:  You might notice some irritation and congestion in your nose or some drainage.  This is from the oxygen used during your procedure.  There is no need for concern and it should clear up in a day or so.  SYMPTOMS TO REPORT IMMEDIATELY:   Following lower endoscopy (colonoscopy or flexible sigmoidoscopy):  Excessive amounts of blood in the stool  Significant tenderness or worsening of abdominal pains  Swelling of the abdomen that is new, acute  Fever of 100F or higher  For urgent or emergent issues, a gastroenterologist can be reached at any hour by calling [phone].   DIET:  We do recommend a small meal at first, but then you may proceed to your regular diet.  Drink plenty of fluids but you should avoid alcoholic beverages for 24 hours.  ACTIVITY:  You should plan to take it easy for the rest of today and you should NOT DRIVE or use heavy machinery until tomorrow (because of the sedation medicines used during the test).     FOLLOW UP: Our staff will call the number listed on your records 48-72 hours following your procedure to check on you and address any questions or concerns that you may have regarding the information given to you following your procedure. If we do not reach you, we will leave a message.  We will attempt to reach you two times.  During this call, we will ask if you have developed any symptoms of COVID 19. If you develop any symptoms (ie: fever, flu-like symptoms, shortness of breath, cough etc.) before then, please call [phone].  If you test positive for Covid 19 in the 2 weeks post procedure, please call and report this information to us.    If any biopsies were taken you will be contacted by phone or by letter within the next 1-3 weeks.  Please call us at [phone] if you have not heard about the biopsies in 3 weeks.    SIGNATURES/CONFIDENTIALITY: You and/or your care partner have signed paperwork which will be entered into your electronic medical record.  These signatures attest to the fact that that the information above on your After Visit Summary has been reviewed and is understood.  Full responsibility of the confidentiality of this discharge information lies with you and/or your care-partner. 

## 2018-09-01 NOTE — Progress Notes (Signed)
To PACU, VSS. Report to [email or web address].tb 

## 2018-09-01 NOTE — Progress Notes (Signed)
Called to room to assist during endoscopic procedure.  Patient ID and intended procedure confirmed with present staff. Received instructions for my participation in the procedure from the performing physician.  

## 2018-09-01 NOTE — Op Note (Signed)
[place] Patient Name: Stacie [name] Procedure Date: 09/01/2018 8:23 AM MRN: [id number] Endoscopist: Mallie Mussel L. Loletha Carrow , MD Age: 63 Referring MD:  Date of Birth: [birth year] Gender: Female Account #: [id number] Procedure:                Colonoscopy Indications:              Chronic diarrhea, Weight loss Medicines:                Monitored Anesthesia Care Procedure:                Pre-Anesthesia Assessment:                           - Prior to the procedure, a History and Physical                            was performed, and patient medications and                            allergies were reviewed. The patient's tolerance of                            previous anesthesia was also reviewed. The risks                            and benefits of the procedure and the sedation                            options and risks were discussed with the patient.                            All questions were answered, and informed consent                            was obtained. Prior Anticoagulants: The patient has                            taken no previous anticoagulant or antiplatelet                            agents. ASA Grade Assessment: III - A patient with                            severe systemic disease. After reviewing the risks                            and benefits, the patient was deemed in                            satisfactory condition to undergo the procedure.                           After obtaining informed consent, the colonoscope  was passed under direct vision. Throughout the                            procedure, the patient's blood pressure, pulse, and                            oxygen saturations were monitored continuously. The                            Colonoscope was introduced through the anus and                            advanced to the the terminal ileum, with                            identification of the appendiceal  orifice and IC                            valve. The colonoscopy was performed without                            difficulty. The patient tolerated the procedure                            well. The quality of the bowel preparation was                            good. The terminal ileum, ileocecal valve,                            appendiceal orifice, and rectum were photographed. Scope In: 8:37:41 AM Scope Out: 8:51:02 AM Scope Withdrawal Time: 0 hours 8 minutes 50 seconds  Total Procedure Duration: 0 hours 13 minutes 21 seconds  Findings:                 The perianal and digital rectal examinations were                            normal.                           The terminal ileum appeared normal.                           Normal mucosa was found in the entire colon.                            Biopsies for histology were taken with a cold                            forceps from the right colon and left colon for                            evaluation of microscopic colitis.  The entire examined colon appeared normal on direct                            and retroflexion views. Complications:            No immediate complications. Estimated Blood Loss:     Estimated blood loss was minimal. Impression:               - The examined portion of the ileum was normal.                           - Normal mucosa in the entire examined colon.                            Biopsied.                           - The entire examined colon is normal on direct and                            retroflexion views. Recommendation:           - Patient has a contact number available for                            emergencies. The signs and symptoms of potential                            delayed complications were discussed with the                            patient. Return to normal activities tomorrow.                            Written discharge instructions were provided to the                             patient.                           - Resume previous diet.                           - Continue present medications. Lomotil can be                            taken as one or two tablets up to four times daily                            as needed to control diarrhea.                           - Await pathology results. If negative for                            microscopic colitis, further stool and lab studies  to follow.                           - Repeat colonoscopy in 10 years for screening                            purposes. Teya Otterson L. Loletha Carrow, MD 09/01/2018 9:00:41 AM This report has been signed electronically.

## 2018-09-01 NOTE — Progress Notes (Signed)
Pt's states no medical or surgical changes since previsit or office visit.  JB temps and CW vitals. PT used marijuana yesterday.

## 2018-09-05 ENCOUNTER — Telehealth: Payer: Self-pay

## 2018-09-05 NOTE — Telephone Encounter (Signed)
  Follow up Call-  Call back number 09/01/2018  Post procedure Call Back phone  # [phone]  Permission to leave phone message Yes  Some recent data might be hidden     Patient questions:  Do you have a fever, pain , or abdominal swelling? No. Pain Score  0 *  Have you tolerated food without any problems? Yes.    Have you been able to return to your normal activities? Yes.    Do you have any questions about your discharge instructions: Diet   No. Medications  No. Follow up visit  No.  Do you have questions or concerns about your Care? No.  Actions: * If pain score is 4 or above: No action needed, pain <4. 1. Have you developed a fever since your procedure? no  2.   Have you had an respiratory symptoms (SOB or cough) since your procedure? no  3.   Have you tested positive for COVID 19 since your procedure no  4.   Have you had any family members/close contacts diagnosed with the COVID 19 since your procedure?  no   If yes to any of these questions please route to Joylene John, RN and Alphonsa Gin, Therapist, sports.

## 2018-09-05 NOTE — Telephone Encounter (Signed)
Attempted to reach pt. With follow-up call following endoscopic procedure 09/01/2018.  LM on pt. Voice mail.  Will try to reach pt. Again later today. 

## 2018-09-06 ENCOUNTER — Other Ambulatory Visit: Payer: Self-pay | Admitting: *Deleted

## 2018-09-06 DIAGNOSIS — K529 Noninfective gastroenteritis and colitis, unspecified: Secondary | ICD-10-CM

## 2018-09-06 MED ORDER — BUDESONIDE 3 MG PO CPEP: 9.0000 mg | ORAL_CAPSULE | Freq: Every day | ORAL | 2 refills | Status: AC

## 2018-09-22 ENCOUNTER — Other Ambulatory Visit (INDEPENDENT_AMBULATORY_CARE_PROVIDER_SITE_OTHER): Payer: Medicare Other

## 2018-09-22 DIAGNOSIS — K529 Noninfective gastroenteritis and colitis, unspecified: Secondary | ICD-10-CM

## 2018-09-22 LAB — IGA: IgA: 316 mg/dL (ref 68–378)

## 2018-09-23 LAB — TISSUE TRANSGLUTAMINASE, IGA: (tTG) Ab, IgA: 1 U/mL

## 2018-10-06 ENCOUNTER — Encounter: Payer: Self-pay | Admitting: Gastroenterology

## 2018-10-06 ENCOUNTER — Ambulatory Visit (INDEPENDENT_AMBULATORY_CARE_PROVIDER_SITE_OTHER): Payer: Medicare Other | Admitting: Gastroenterology

## 2018-10-06 VITALS — BP 150/70 | HR 88 | Temp 99.2°F | Ht 64.57 in | Wt 89.1 lb

## 2018-10-06 DIAGNOSIS — K529 Noninfective gastroenteritis and colitis, unspecified: Secondary | ICD-10-CM

## 2018-10-06 DIAGNOSIS — K52831 Collagenous colitis: Secondary | ICD-10-CM | POA: Diagnosis not present

## 2018-10-06 DIAGNOSIS — R634 Abnormal weight loss: Secondary | ICD-10-CM | POA: Diagnosis not present

## 2018-10-06 NOTE — Progress Notes (Signed)
     [facility] Progress Note  Chief Complaint: Collagenous colitis  Subjective  History:  Stacie [name] is here to follow-up for her chronic diarrhea.  Colonoscopy with biopsies revealed collagenous colitis, and she started budesonide 9 mg daily about 4 weeks ago.  The diarrhea has resolved, and she has 1 formed bowel movement per day.  Her appetite is increased and she is put a few pounds back on.  She denies nausea vomiting or rectal bleeding.  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  It is not clear if she was given any particular therapy, and she certainly does not recall any.  ROS: Cardiovascular:  no chest pain Respiratory: no dyspnea  The patient's Past Medical, Family and Social History were reviewed and are on file in the EMR.  Objective:  Med list reviewed  Current Outpatient Medications:  .  amLODipine (NORVASC) 10 MG tablet, Take 1 tablet (10 mg total) by mouth daily., Disp: 90 tablet, Rfl: 3 .  budesonide (ENTOCORT EC) 3 MG 24 hr capsule, Take 3 capsules (9 mg total) by mouth daily., Disp: 90 capsule, Rfl: 2 .  doxylamine, Sleep, (UNISOM) 25 MG tablet, Take 25 mg by mouth at bedtime as needed., Disp: , Rfl:  .  escitalopram (LEXAPRO) 20 MG tablet, Take 1 tablet (20 mg total) by mouth daily., Disp: 90 tablet, Rfl: 3 .  diphenoxylate-atropine (LOMOTIL) 2.5-0.025 MG tablet, Take 1 tablet by mouth 4 (four) times daily as needed for diarrhea or loose stools. (Patient not taking: Reported on 10/06/2018), Disp: 100 tablet, Rfl: 1   Vital signs in last 24 hrs: Vitals:   10/06/18 1312  BP: (!) 150/70  Pulse: 88  Temp: 99.2 F (37.3 C)    Physical Exam  Well-appearing.  Thin with poor muscle mass as before  HEENT: sclera anicteric, oral mucosa moist without lesions  Neck: supple, no thyromegaly, JVD or lymphadenopathy  Cardiac: RRR without murmurs, S1S2  heard, no peripheral edema  Pulm: clear to auscultation bilaterally, normal RR and effort noted  Abdomen: soft, no tenderness, with active bowel sounds. No guarding or palpable hepatosplenomegaly.  Skin; warm and dry, no jaundice or rash  Recent Labs:  Biopsies showed collagenous colitis  ttg Ab = 1 Total IgA normal  @ASSESSMENTPLANBEGIN @ Assessment: Encounter Diagnoses  Name Primary?  . Collagenous colitis Yes  . Chronic diarrhea   . Abnormal loss of weight    We discussed the uncertain cause of microscopic colitis.  She is not on chronic NSAIDs or PPI, and does not even take the baby aspirin that was recommended by primary care.  She tested negative for celiac antibodies.  Fortunately, budesonide has worked well for her.   Plan: Continue 9 mg budesonide daily for another 4 weeks, then decrease to 6 mg daily for 2 weeks, then 3 mg once daily for 2 weeks then stop. Call us with an update on symptoms at the end of that treatment course, and certainly sooner as needed.   Total time 15 minutes, over half spent face-to-face with patient in counseling and coordination of care.   Nelida Meuse III

## 2018-10-06 NOTE — Patient Instructions (Signed)
If you are age 63 or older, your body mass index should be between 23-30. Your Body mass index is 15.03 kg/m. If this is out of the aforementioned range listed, please consider follow up with your Primary Care Provider.  If you are age 49 or younger, your body mass index should be between 19-25. Your Body mass index is 15.03 kg/m. If this is out of the aformentioned range listed, please consider follow up with your Primary Care Provider.   Please continue the Budesonide as follows:   Budesonide 3 capsules (9 mg) x 4 weeks   Budesonide 2 capsules (6mg ) x 2 weeks  Budesonide 1 capsule (3mg ) x 2 weeks   Then stop!    Call us with an update on symptoms [phone]  It was a pleasure to see you today!  Dr. Loletha Carrow

## 2018-10-26 ENCOUNTER — Other Ambulatory Visit: Payer: Self-pay | Admitting: Nurse Practitioner

## 2018-10-26 DIAGNOSIS — F411 Generalized anxiety disorder: Secondary | ICD-10-CM

## 2018-10-26 DIAGNOSIS — F3342 Major depressive disorder, recurrent, in full remission: Secondary | ICD-10-CM

## 2019-01-02 ENCOUNTER — Telehealth: Payer: Self-pay | Admitting: Nurse Practitioner

## 2019-01-02 DIAGNOSIS — I10 Essential (primary) hypertension: Secondary | ICD-10-CM

## 2019-01-02 MED ORDER — AMLODIPINE BESYLATE 10 MG PO TABS: 10.0000 mg | ORAL_TABLET | Freq: Every day | ORAL | 1 refills | Status: DC

## 2019-01-02 NOTE — Telephone Encounter (Signed)
F/up is due 01/2019. Can be virtual appt

## 2019-01-02 NOTE — Telephone Encounter (Signed)
Pt is aware and will call back to schedule an virtual in 01/2019.

## 2019-01-02 NOTE — Telephone Encounter (Signed)
Received rx request from [facility] on [address] for Amlodipine 10 mg tab take 1 tab dialy.   Last follow up for HTN on 06/2018---didn't say when to F/U---[place] please advise   Rx sent in 6 mo supply.

## 2019-04-13 ENCOUNTER — Telehealth: Payer: Self-pay | Admitting: Nurse Practitioner

## 2019-04-13 NOTE — Telephone Encounter (Signed)
Left message for patient to schedule Annual Wellness Visit.  Please schedule with Nurse Health Advisor Victoria Britt, RN at [facility]  

## 2019-05-04 ENCOUNTER — Encounter (HOSPITAL_COMMUNITY): Payer: Self-pay

## 2019-05-04 ENCOUNTER — Emergency Department (HOSPITAL_COMMUNITY): Payer: Medicare Other

## 2019-05-04 ENCOUNTER — Other Ambulatory Visit: Payer: Self-pay

## 2019-05-04 ENCOUNTER — Inpatient Hospital Stay (HOSPITAL_COMMUNITY)
Admission: EM | Admit: 2019-05-04 | Discharge: 2019-05-12 | DRG: 064 | Disposition: A | Discharge status: Rehabilitation Facility | Payer: Medicare Other | Attending: Family Medicine | Admitting: Family Medicine

## 2019-05-04 DIAGNOSIS — I69354 Hemiplegia and hemiparesis following cerebral infarction affecting left non-dominant side: Secondary | ICD-10-CM | POA: Diagnosis not present

## 2019-05-04 DIAGNOSIS — Z9119 Patient's noncompliance with other medical treatment and regimen: Secondary | ICD-10-CM

## 2019-05-04 DIAGNOSIS — E785 Hyperlipidemia, unspecified: Secondary | ICD-10-CM | POA: Diagnosis not present

## 2019-05-04 DIAGNOSIS — F1290 Cannabis use, unspecified, uncomplicated: Secondary | ICD-10-CM | POA: Diagnosis not present

## 2019-05-04 DIAGNOSIS — F1010 Alcohol abuse, uncomplicated: Secondary | ICD-10-CM | POA: Diagnosis present

## 2019-05-04 DIAGNOSIS — I10 Essential (primary) hypertension: Secondary | ICD-10-CM | POA: Diagnosis not present

## 2019-05-04 DIAGNOSIS — E43 Unspecified severe protein-calorie malnutrition: Secondary | ICD-10-CM | POA: Diagnosis present

## 2019-05-04 DIAGNOSIS — Z20822 Contact with and (suspected) exposure to covid-19: Secondary | ICD-10-CM | POA: Diagnosis present

## 2019-05-04 DIAGNOSIS — Z720 Tobacco use: Secondary | ICD-10-CM | POA: Diagnosis not present

## 2019-05-04 DIAGNOSIS — R112 Nausea with vomiting, unspecified: Secondary | ICD-10-CM | POA: Diagnosis present

## 2019-05-04 DIAGNOSIS — H9190 Unspecified hearing loss, unspecified ear: Secondary | ICD-10-CM | POA: Diagnosis not present

## 2019-05-04 DIAGNOSIS — Z1612 Extended spectrum beta lactamase (ESBL) resistance: Secondary | ICD-10-CM | POA: Diagnosis not present

## 2019-05-04 DIAGNOSIS — I69322 Dysarthria following cerebral infarction: Secondary | ICD-10-CM

## 2019-05-04 DIAGNOSIS — R4781 Slurred speech: Secondary | ICD-10-CM | POA: Diagnosis present

## 2019-05-04 DIAGNOSIS — R2681 Unsteadiness on feet: Secondary | ICD-10-CM | POA: Diagnosis not present

## 2019-05-04 DIAGNOSIS — E876 Hypokalemia: Secondary | ICD-10-CM | POA: Diagnosis present

## 2019-05-04 DIAGNOSIS — I6930 Unspecified sequelae of cerebral infarction: Secondary | ICD-10-CM | POA: Diagnosis present

## 2019-05-04 DIAGNOSIS — G459 Transient cerebral ischemic attack, unspecified: Secondary | ICD-10-CM | POA: Diagnosis not present

## 2019-05-04 DIAGNOSIS — I110 Hypertensive heart disease with heart failure: Secondary | ICD-10-CM | POA: Diagnosis not present

## 2019-05-04 DIAGNOSIS — Z8249 Family history of ischemic heart disease and other diseases of the circulatory system: Secondary | ICD-10-CM

## 2019-05-04 DIAGNOSIS — E079 Disorder of thyroid, unspecified: Secondary | ICD-10-CM | POA: Diagnosis present

## 2019-05-04 DIAGNOSIS — R1111 Vomiting without nausea: Secondary | ICD-10-CM | POA: Diagnosis not present

## 2019-05-04 DIAGNOSIS — Y900 Blood alcohol level of less than 20 mg/100 ml: Secondary | ICD-10-CM | POA: Diagnosis present

## 2019-05-04 DIAGNOSIS — I739 Peripheral vascular disease, unspecified: Secondary | ICD-10-CM | POA: Diagnosis present

## 2019-05-04 DIAGNOSIS — F1091 Alcohol use, unspecified, in remission: Secondary | ICD-10-CM | POA: Diagnosis present

## 2019-05-04 DIAGNOSIS — F17210 Nicotine dependence, cigarettes, uncomplicated: Secondary | ICD-10-CM | POA: Diagnosis present

## 2019-05-04 DIAGNOSIS — I639 Cerebral infarction, unspecified: Secondary | ICD-10-CM | POA: Diagnosis not present

## 2019-05-04 DIAGNOSIS — Z79899 Other long term (current) drug therapy: Secondary | ICD-10-CM

## 2019-05-04 DIAGNOSIS — R636 Underweight: Secondary | ICD-10-CM | POA: Diagnosis not present

## 2019-05-04 DIAGNOSIS — R32 Unspecified urinary incontinence: Secondary | ICD-10-CM | POA: Diagnosis present

## 2019-05-04 DIAGNOSIS — R29810 Facial weakness: Secondary | ICD-10-CM | POA: Diagnosis not present

## 2019-05-04 DIAGNOSIS — R110 Nausea: Secondary | ICD-10-CM | POA: Diagnosis not present

## 2019-05-04 DIAGNOSIS — F339 Major depressive disorder, recurrent, unspecified: Secondary | ICD-10-CM | POA: Diagnosis not present

## 2019-05-04 DIAGNOSIS — K529 Noninfective gastroenteritis and colitis, unspecified: Secondary | ICD-10-CM | POA: Diagnosis present

## 2019-05-04 DIAGNOSIS — I160 Hypertensive urgency: Secondary | ICD-10-CM

## 2019-05-04 DIAGNOSIS — I5032 Chronic diastolic (congestive) heart failure: Secondary | ICD-10-CM | POA: Diagnosis present

## 2019-05-04 DIAGNOSIS — R0902 Hypoxemia: Secondary | ICD-10-CM | POA: Diagnosis not present

## 2019-05-04 DIAGNOSIS — F32A Depression, unspecified: Secondary | ICD-10-CM | POA: Diagnosis present

## 2019-05-04 DIAGNOSIS — E782 Mixed hyperlipidemia: Secondary | ICD-10-CM | POA: Diagnosis not present

## 2019-05-04 DIAGNOSIS — N390 Urinary tract infection, site not specified: Secondary | ICD-10-CM | POA: Diagnosis present

## 2019-05-04 DIAGNOSIS — Z888 Allergy status to other drugs, medicaments and biological substances status: Secondary | ICD-10-CM

## 2019-05-04 DIAGNOSIS — F1020 Alcohol dependence, uncomplicated: Secondary | ICD-10-CM

## 2019-05-04 DIAGNOSIS — Z87898 Personal history of other specified conditions: Secondary | ICD-10-CM | POA: Diagnosis present

## 2019-05-04 DIAGNOSIS — R140 Abdominal distension (gaseous): Secondary | ICD-10-CM

## 2019-05-04 DIAGNOSIS — Z87891 Personal history of nicotine dependence: Secondary | ICD-10-CM | POA: Diagnosis present

## 2019-05-04 DIAGNOSIS — R29702 NIHSS score 2: Secondary | ICD-10-CM | POA: Diagnosis present

## 2019-05-04 DIAGNOSIS — Z681 Body mass index (BMI) 19 or less, adult: Secondary | ICD-10-CM

## 2019-05-04 DIAGNOSIS — F329 Major depressive disorder, single episode, unspecified: Secondary | ICD-10-CM | POA: Diagnosis present

## 2019-05-04 LAB — CBC WITH DIFFERENTIAL/PLATELET
Abs Immature Granulocytes: 0.03 10*3/uL (ref 0.00–0.07)
Basophils Absolute: 0 10*3/uL (ref 0.0–0.1)
Basophils Relative: 0 %
Eosinophils Absolute: 0 10*3/uL (ref 0.0–0.5)
Eosinophils Relative: 0 %
HCT: 44.8 % (ref 36.0–46.0)
Hemoglobin: 14 g/dL (ref 12.0–15.0)
Immature Granulocytes: 0 %
Lymphocytes Relative: 13 %
Lymphs Abs: 1.3 10*3/uL (ref 0.7–4.0)
MCH: 28.2 pg (ref 26.0–34.0)
MCHC: 31.3 g/dL (ref 30.0–36.0)
MCV: 90.1 fL (ref 80.0–100.0)
Monocytes Absolute: 0.7 10*3/uL (ref 0.1–1.0)
Monocytes Relative: 7 %
Neutro Abs: 8 10*3/uL — ABNORMAL HIGH (ref 1.7–7.7)
Neutrophils Relative %: 80 %
Platelets: 310 10*3/uL (ref 150–400)
RBC: 4.97 MIL/uL (ref 3.87–5.11)
RDW: 13.5 % (ref 11.5–15.5)
WBC: 10 10*3/uL (ref 4.0–10.5)
nRBC: 0 % (ref 0.0–0.2)

## 2019-05-04 LAB — URINALYSIS, ROUTINE W REFLEX MICROSCOPIC
Bilirubin Urine: NEGATIVE
Glucose, UA: NEGATIVE mg/dL
Hgb urine dipstick: NEGATIVE
Ketones, ur: 80 mg/dL — AB
Leukocytes,Ua: NEGATIVE
Nitrite: NEGATIVE
Protein, ur: 100 mg/dL — AB
Specific Gravity, Urine: 1.024 (ref 1.005–1.030)
pH: 5 (ref 5.0–8.0)

## 2019-05-04 LAB — RAPID URINE DRUG SCREEN, HOSP PERFORMED
Amphetamines: NOT DETECTED
Barbiturates: NOT DETECTED
Benzodiazepines: NOT DETECTED
Cocaine: NOT DETECTED
Opiates: NOT DETECTED
Tetrahydrocannabinol: POSITIVE — AB

## 2019-05-04 LAB — COMPREHENSIVE METABOLIC PANEL
ALT: 16 U/L (ref 0–44)
AST: 25 U/L (ref 15–41)
Albumin: 4.1 g/dL (ref 3.5–5.0)
Alkaline Phosphatase: 81 U/L (ref 38–126)
Anion gap: 14 (ref 5–15)
BUN: 16 mg/dL (ref 8–23)
CO2: 22 mmol/L (ref 22–32)
Calcium: 9.1 mg/dL (ref 8.9–10.3)
Chloride: 103 mmol/L (ref 98–111)
Creatinine, Ser: 0.81 mg/dL (ref 0.44–1.00)
GFR calc Af Amer: >60 mL/min (ref >60)
GFR calc non Af Amer: >60 mL/min (ref >60)
Glucose, Bld: 109 mg/dL — ABNORMAL HIGH (ref 70–99)
Potassium: 3.3 mmol/L — ABNORMAL LOW (ref 3.5–5.1)
Sodium: 139 mmol/L (ref 135–145)
Total Bilirubin: 1 mg/dL (ref 0.3–1.2)
Total Protein: 6.9 g/dL (ref 6.5–8.1)

## 2019-05-04 LAB — TSH: TSH: 1.609 u[IU]/mL (ref 0.350–4.500)

## 2019-05-04 LAB — LIPASE, BLOOD: Lipase: 26 U/L (ref 11–51)

## 2019-05-04 LAB — LACTIC ACID, PLASMA: Lactic Acid, Venous: 0.9 mmol/L (ref 0.5–1.9)

## 2019-05-04 LAB — ETHANOL: Alcohol, Ethyl (B): <10 mg/dL (ref <10)

## 2019-05-04 MED ORDER — LORAZEPAM 2 MG/ML IJ SOLN
2.0000 mg | Freq: Once | INTRAMUSCULAR | Status: AC
  Administered 2019-05-04: 2 mg via INTRAVENOUS
  Filled 2019-05-04: qty 1

## 2019-05-04 MED ORDER — ACETAMINOPHEN 325 MG PO TABS: 650.0000 mg | ORAL_TABLET | ORAL | Status: DC | PRN

## 2019-05-04 MED ORDER — LABETALOL HCL 5 MG/ML IV SOLN
20.0000 mg | INTRAVENOUS | Status: DC | PRN
  Administered 2019-05-04 – 2019-05-05 (×2): 20 mg via INTRAVENOUS
  Filled 2019-05-04 (×2): qty 4

## 2019-05-04 MED ORDER — STROKE: EARLY STAGES OF RECOVERY BOOK
Freq: Once | Status: AC
  Filled 2019-05-04: qty 1

## 2019-05-04 MED ORDER — SODIUM CHLORIDE 0.9 % IV SOLN: INTRAVENOUS | Status: DC

## 2019-05-04 MED ORDER — LORAZEPAM 2 MG/ML IJ SOLN
1.0000 mg | INTRAMUSCULAR | Status: DC | PRN
  Administered 2019-05-04 – 2019-05-05 (×2): 1 mg via INTRAVENOUS
  Filled 2019-05-04 (×3): qty 1

## 2019-05-04 MED ORDER — ASPIRIN EC 325 MG PO TBEC: 325.0000 mg | DELAYED_RELEASE_TABLET | Freq: Every day | ORAL | Status: DC

## 2019-05-04 MED ORDER — SENNOSIDES-DOCUSATE SODIUM 8.6-50 MG PO TABS: 1.0000 | ORAL_TABLET | Freq: Every evening | ORAL | Status: DC | PRN

## 2019-05-04 MED ORDER — AMLODIPINE BESYLATE 10 MG PO TABS
10.0000 mg | ORAL_TABLET | Freq: Every day | ORAL | Status: DC
  Administered 2019-05-05 – 2019-05-12 (×8): 10 mg via ORAL
  Filled 2019-05-04 (×8): qty 1

## 2019-05-04 MED ORDER — SIMVASTATIN 20 MG PO TABS
20.0000 mg | ORAL_TABLET | Freq: Every day | ORAL | Status: DC
  Administered 2019-05-05 – 2019-05-11 (×7): 20 mg via ORAL
  Filled 2019-05-04 (×7): qty 1

## 2019-05-04 MED ORDER — ASPIRIN 300 MG RE SUPP: 300.0000 mg | Freq: Every day | RECTAL | Status: DC

## 2019-05-04 MED ORDER — ACETAMINOPHEN 650 MG RE SUPP: 650.0000 mg | RECTAL | Status: DC | PRN

## 2019-05-04 MED ORDER — THIAMINE HCL 100 MG/ML IJ SOLN
Freq: Once | INTRAVENOUS | Status: AC
  Filled 2019-05-04: qty 1000

## 2019-05-04 MED ORDER — DOXYLAMINE SUCCINATE (SLEEP) 25 MG PO TABS
25.0000 mg | ORAL_TABLET | Freq: Every evening | ORAL | Status: DC | PRN
  Administered 2019-05-06 – 2019-05-11 (×6): 25 mg via ORAL
  Filled 2019-05-04 (×7): qty 1

## 2019-05-04 MED ORDER — ACETAMINOPHEN 160 MG/5ML PO SOLN: 650.0000 mg | ORAL | Status: DC | PRN

## 2019-05-04 MED ORDER — ESCITALOPRAM OXALATE 10 MG PO TABS
20.0000 mg | ORAL_TABLET | Freq: Every day | ORAL | Status: DC
  Administered 2019-05-05 – 2019-05-12 (×8): 20 mg via ORAL
  Filled 2019-05-04 (×8): qty 2

## 2019-05-04 MED ORDER — ENOXAPARIN SODIUM 40 MG/0.4ML ~~LOC~~ SOLN
40.0000 mg | SUBCUTANEOUS | Status: DC
  Administered 2019-05-05 – 2019-05-12 (×8): 40 mg via SUBCUTANEOUS
  Filled 2019-05-04 (×8): qty 0.4

## 2019-05-04 NOTE — ED Triage Notes (Addendum)
Pt bib [facility] from home w/ c/o slurred speech and L sided facial droop. Symptoms developed Tues night (unkown time). Pt is alcoholic and drinks every day. Pt's ex-husband noticed symptoms on Tuesday night, however thought she was just drunk. When symptoms did not improve on Weds, family tried to get her to come to the ED, however pt refused. Pt AOx4 on arrival to ED, noticeable L sided facial droop and slurred speech, denies unilateral weakness. Pt has hx of stroke. Per EMS pt has not had any alcohol in 2 days. Pt c/o nausea and vomited bile w/ EMS. Pt received 4mg  IV zofran w/ EMS.   EMS VS:  BP 202/95 HR 61 NSR SPO2 97% RA CBG 129 TEMP 97.8 F RR 20

## 2019-05-04 NOTE — ED Notes (Signed)
Patient transported to MRI 

## 2019-05-04 NOTE — H&P (Signed)
[place] [facility] [place] NAME: Stacie [name]    MR#:  [id number]  DATE OF BIRTH:  11/06/1955  DATE OF ADMISSION:  05/04/2019  PRIMARY CARE PHYSICIAN: Nche, Charlene Brooke, NP   REQUESTING/REFERRING PHYSICIAN: Marda Stalker, MD  CHIEF COMPLAINT:   Chief Complaint  Patient presents with  . Stroke Symptoms    HISTORY OF PRESENT ILLNESS:  Stacie [name]  is a 64 y.o. Caucasian female with a known history of hypertension, dyslipidemia and tobacco, marijuana and alcohol abuse as well as depression, who presented to the emergency room with acute onset of left facial droop with dysarthria when she woke up on Saturday morning with weakness in both legs.  She denied any paresthesias, headache or dizziness.  She thought she will be a right per her report and therefore did not seek medical attention.  She admits to nausea and vomiting once and chills without measured fever.  She denies any chest pain or palpitations.  No cough or wheezing or abdominal pain or diarrhea.  No bleeding diathesis.  She has occasional urinary incontinence but denied any bowel incontinence.  No tinnitus or vertigo.  She drinks 2-4 beer per day and her last alcohol drink was 4 days ago.  Upon presentation to the emergency room, blood pressure was 201/90 with otherwise normal vital signs. Labs revealed mild hypokalemia of 3.3.  CBC was unremarkable.  Lactic acid was 0.9.  UA was negative.  Urine drug screen was positive for marijuana.  COVID-19 PCR is currently pending.  Noncontrasted head CT scan showed generalized parenchymal atrophy and chronic small vessel ischemic disease and age indeterminate infarcts within the right frontal lobe, white matter and right basal ganglia, possibly acute or subacute with recommendation for brain MRI.  Brain MRI showed 8 mm acute infarction in the posterior limb of internal capsule on the right with punctate acute infarction either in the left caudate head or white  matter adjacent to the frontal horn of the left lateral ventricle.  It showed 8 mm acute to subacute infarction white matter, adjacent to the atrium of the right lateral ventricle and old small vessel infarctions affecting the hemispheric white matter in the right basal ganglia.  The patient was given 2 mg of IV Ativan.  She will be admitted to an observation telemetry medical bed for further evaluation and management. PAST MEDICAL HISTORY:   Past Medical History:  Diagnosis Date  . Colitis   . Hyperlipidemia   . Hypertension   . Stroke ([place]) 04/2012   unknown type  . Thyroid disease   Tobacco, marijuana and alcohol abuse -Depression  PAST SURGICAL HISTORY:  History reviewed. No pertinent surgical history.  She denies any previous surgeries.  SOCIAL HISTORY:   Social History   Tobacco Use  . Smoking status: Current Every Day Smoker    Packs/day: 0.25    Years: 10.00    Pack years: 2.50  . Smokeless tobacco: Never Used  Substance Use Topics  . Alcohol use: Yes    Alcohol/week: 14.0 standard drinks    Types: 14 Cans of beer per week    Comment: 8 beers a week    FAMILY HISTORY:   Family History  Problem Relation Age of Onset  . Stroke Mother 50       embolic  . Heart disease Father 80       CAd with CABG, no MI  . Heart disease Sister 67       CAD  .  Heart disease Paternal Uncle   . Heart disease Maternal Grandmother   . Heart disease Paternal Grandfather   . Colon cancer Neg Hx   . Esophageal cancer Neg Hx   . Stomach cancer Neg Hx     DRUG ALLERGIES:   Allergies  Allergen Reactions  . Trazodone And Nefazodone Nausea And Vomiting  . Wellbutrin [Bupropion] Nausea And Vomiting    REVIEW OF SYSTEMS:   ROS As per history of present illness. All pertinent systems were reviewed above. Constitutional,  HEENT, cardiovascular, respiratory, GI, GU, musculoskeletal, neuro, psychiatric, endocrine,  integumentary and hematologic systems were reviewed and are  otherwise  negative/unremarkable except for positive findings mentioned above in the HPI.   MEDICATIONS AT HOME:   Prior to Admission medications   Medication Sig Start Date End Date Taking? Authorizing Provider  amLODipine (NORVASC) 10 MG tablet Take 1 tablet (10 mg total) by mouth daily. 01/02/19  Yes Nche, Charlene Brooke, NP  doxylamine, Sleep, (UNISOM) 25 MG tablet Take 25 mg by mouth at bedtime as needed for sleep.    Yes [provider]  escitalopram (LEXAPRO) 20 MG tablet TAKE 1 TABLET(20 MG) BY MOUTH DAILY Patient taking differently: Take 20 mg by mouth daily.  10/26/18  Yes Nche, Charlene Brooke, NP  diphenoxylate-atropine (LOMOTIL) 2.5-0.025 MG tablet Take 1 tablet by mouth 4 (four) times daily as needed for diarrhea or loose stools. Patient not taking: Reported on 10/06/2018 09/01/18   Doran Stabler, MD      VITAL SIGNS:  Blood pressure (!) 164/76, pulse (!) 52, temperature 97.7 F (36.5 C), temperature source Oral, resp. rate 18, SpO2 92 %.  PHYSICAL EXAMINATION:  Physical Exam  GENERAL:  64 y.o.-year-old Caucasian female patient lying in the bed with no acute distress.  EYES: Pupils equal, round, reactive to light and accommodation. No scleral icterus. Extraocular muscles intact.  HEENT: Head atraumatic, normocephalic. Oropharynx with left facial droop with mild drooling and nasopharynx clear.  NECK:  Supple, no jugular venous distention. No thyroid enlargement, no tenderness.  LUNGS: Normal breath sounds bilaterally, no wheezing, rales,rhonchi or crepitation. No use of accessory muscles of respiration.  CARDIOVASCULAR: Regular rate and rhythm, S1, S2 normal. No murmurs, rubs, or gallops.  ABDOMEN: Soft, nondistended, nontender. Bowel sounds present. No organomegaly or mass.  EXTREMITIES: No pedal edema, cyanosis, or clubbing.  NEUROLOGIC: Cranial nerves II through XII are intact except for left facial droop with mild drooling. Muscle strength 5/5 in all  extremities. Sensation intact. Gait not checked.  PSYCHIATRIC: The patient is alert and oriented x 3.  Normal affect and good eye contact. SKIN: No obvious rash, lesion, or ulcer.   LABORATORY PANEL:   CBC Recent Labs  Lab 05/04/19 1536  WBC 10.0  HGB 14.0  HCT 44.8  PLT 310   ------------------------------------------------------------------------------------------------------------------  Chemistries  Recent Labs  Lab 05/04/19 1536  NA 139  K 3.3*  CL 103  CO2 22  GLUCOSE 109*  BUN 16  CREATININE 0.81  CALCIUM 9.1  AST 25  ALT 16  ALKPHOS 81  BILITOT 1.0   ------------------------------------------------------------------------------------------------------------------  Cardiac Enzymes No results for input(s): TROPONINI in the last 168 hours. ------------------------------------------------------------------------------------------------------------------  RADIOLOGY:  CT Head Wo Contrast  Result Date: 05/04/2019 CLINICAL DATA:  Neuro deficit, acute, stroke suspected. Additional history provided: Patient transported via EMS from home for slurred speech and left-sided facial droop, symptoms developed Tuesday night (unknown time). History of stroke. EXAM: CT HEAD WITHOUT CONTRAST TECHNIQUE: Contiguous axial images were  obtained from the base of the skull through the vertex without intravenous contrast. COMPARISON:  No pertinent prior studies available for comparison. FINDINGS: Brain: Mildly motion degraded examination. Age-indeterminate infarct within the right corona radiata/basal ganglia. There is an additional age-indeterminate infarct more anteriorly within the right basal ganglia also involve the adjacent deep right frontal white matter. There is no evidence of acute intracranial hemorrhage. No midline shift or extra-axial fluid collection. Background mild ill-defined hypoattenuation within the cerebral white matter is nonspecific, but consistent with chronic small  vessel ischemic disease. Mild generalized parenchymal atrophy. Vascular: No hyperdense vessel is identified. Atherosclerotic calcifications. Skull: Normal. Negative for fracture or focal lesion. Sinuses/Orbits: Visualized orbits demonstrate no acute abnormality. No significant paranasal sinus disease or mastoid effusion at the imaged levels. IMPRESSION: Mildly motion degraded examination. Age-indeterminate infarcts within the right frontal lobe white matter and right basal ganglia as described, possibly acute or subacute. Brain MRI is suggested for further evaluation. Background mild generalized parenchymal atrophy and chronic small vessel ischemic disease. Electronically Signed   By: Kellie Simmering DO   On: 05/04/2019 17:24   MR BRAIN WO CONTRAST  Result Date: 05/04/2019 CLINICAL DATA:  Left facial droop and dysarthria noted over the last 3 days. EXAM: MRI HEAD WITHOUT CONTRAST TECHNIQUE: Multiplanar, multiecho pulse sequences of the brain and surrounding structures were obtained without intravenous contrast. COMPARISON:  Head CT same day FINDINGS: Brain: Motion degraded study. No acute finding affects the brainstem or cerebellum. 8 mm acute infarction in the posterior limb internal capsule on the right. Punctate acute infarction in the left caudate head or white matter adjacent to the frontal horn of the lateral ventricle. 8 mm acute to subacute infarction in the white matter adjacent to the atrium of the right lateral ventricle. Other white matter disease seen at CT is not acute or subacute. Old right basal ganglia infarction. No hydrocephalus. No sign of gross hemorrhage. No extra-axial collection. IMPRESSION: Diffusion only study is degraded by motion. 8 mm acute infarction in the posterior limb internal capsule on the right. Punctate acute infarction either in the left caudate head or white matter adjacent to the frontal horn of the left lateral ventricle. 8 mm acute to subacute infarction in the white matter  adjacent to the atrium of the right lateral ventricle. Old small vessel infarctions elsewhere affecting the hemispheric white matter and right basal ganglia. Electronically Signed   By: Nelson Chimes M.D.   On: 05/04/2019 20:58      IMPRESSION AND PLAN:   1.  Ischemic stroke with multiple acute to subacute cerebral infarctions including the posterior limb of the right internal capsule, left caudate or white matter adjacent to the left lateral ventricle frontal horn, white matter adjacent to the atrium of the right lateral ventricle in addition to old small vessel infarctions in the right basal ganglia and right hemispheric white matter.  Differential diagnosis would include hypertensive etiology and embolic. -The patient will be admitted to an observation telemetry medical bed. -We will follow neuro checks every 4 hours for 24 hours. -We will place the patient on aspirin and statin therapy -We will check her fasting lipids. -A neurology consultation will be obtained. -I discussed the case with Dr. Leonel Ramsay. -We will obtain a 2D echo and bilateral carotid Doppler for further assessment. -PT/OT and speech therapy consults will be obtained. -We will keep her n.p.o. for now.  2.  Hypertensive urgency. -This like secondary to noncompliance. -We will restart her Norvasc that she last took  last week. -She will be placed on as needed IV labetalol for abdominal blood pressure control.  3.  Alcohol abuse likely with uncomplicated alcohol dependence. -We will check alcohol level. -She will be placed on as needed IV Ativan and a banana bag daily.  4.  History of dyslipidemia. -She will be placed on statin therapy and will check fasting lipids as mentioned above.  5.  Tobacco and marijuana abuse. -I counseled her for cessation.  She will receive further counseling here.  6.  DVT prophylaxis. -Subcutaneous Lovenox  All the records are reviewed and case discussed with ED provider. The plan of  care was discussed in details with the patient (and family). I answered all questions. The patient agreed to proceed with the above mentioned plan. Further management will depend upon hospital course.   CODE STATUS: Full code  TOTAL TIME TAKING CARE OF THIS PATIENT: 55 minutes.    Christel Mormon M.D on 05/04/2019 at 10:56 PM  [facility]   From 7 PM-7 AM, contact night-coverage [email or web address]  CC: Primary care physician; Nche, Charlene Brooke, NP   Note: This dictation was prepared with Dragon dictation along with smaller phrase technology. Any transcriptional errors that result from this process are unintentional.

## 2019-05-04 NOTE — ED Notes (Signed)
Sudie Bailey [id number], call daughter for updates and when she is admitted.

## 2019-05-04 NOTE — ED Provider Notes (Signed)
[facility] EMERGENCY DEPARTMENT Provider Note   CSN: [id number] Arrival date & time: 05/04/19  1506     History Chief Complaint  Patient presents with  . Stroke Symptoms    Stacie [name] is a 64 y.o. female.  The history is provided by the patient and medical records. No language interpreter was used.  Neurologic Problem This is a new problem. The current episode started more than 2 days ago. The problem occurs constantly. The problem has not changed since onset.Pertinent negatives include no chest pain, no abdominal pain, no headaches and no shortness of breath. Nothing aggravates the symptoms. Nothing relieves the symptoms. She has tried nothing for the symptoms. The treatment provided no relief.       Past Medical History:  Diagnosis Date  . Colitis   . Hyperlipidemia   . Hypertension   . Stroke ([place]) 04/2012   unknown type  . Thyroid disease     Patient Active Problem List   Diagnosis Date Noted  . Chronic diarrhea 08/26/2018  . Loss of weight 08/26/2018  . PAD (peripheral artery disease) ([place]) 12/06/2017  . Claudication of right lower extremity ([place]) 12/06/2017  . Hyperlipidemia 02/28/2016  . Essential hypertension 02/21/2016  . 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 Hearing loss 02/21/2016    History reviewed. No pertinent surgical history.   OB History   No obstetric history on file.     Family History  Problem Relation Age of Onset  . Stroke Mother 57       embolic  . Heart disease Father 59       CAd with CABG, no MI  . Heart disease Sister 65       CAD  . Heart disease Paternal Uncle   . Heart disease Maternal Grandmother   . Heart disease Paternal Grandfather   . Colon cancer Neg Hx   . Esophageal cancer Neg Hx   . Stomach cancer Neg Hx     Social History   Tobacco Use  . Smoking status: Current Every Day Smoker    Packs/day: 0.25    Years: 10.00    Pack years: 2.50  .  Smokeless tobacco: Never Used  Substance Use Topics  . Alcohol use: Yes    Alcohol/week: 14.0 standard drinks    Types: 14 Cans of beer per week    Comment: 8 beers a week  . Drug use: Yes    Frequency: 7.0 times per week    Types: Marijuana    Comment: to help with colitis- multiple times a day    Home Medications Prior to Admission medications   Medication Sig Start Date End Date Taking? Authorizing Provider  amLODipine (NORVASC) 10 MG tablet Take 1 tablet (10 mg total) by mouth daily. 01/02/19  Yes Nche, Charlene Brooke, NP  doxylamine, Sleep, (UNISOM) 25 MG tablet Take 25 mg by mouth at bedtime as needed for sleep.    Yes [provider]  escitalopram (LEXAPRO) 20 MG tablet TAKE 1 TABLET(20 MG) BY MOUTH DAILY Patient taking differently: Take 20 mg by mouth daily.  10/26/18  Yes Nche, Charlene Brooke, NP  diphenoxylate-atropine (LOMOTIL) 2.5-0.025 MG tablet Take 1 tablet by mouth 4 (four) times daily as needed for diarrhea or loose stools. Patient not taking: Reported on 10/06/2018 09/01/18   Doran Stabler, MD    Allergies    Trazodone and nefazodone and Wellbutrin [bupropion]  Review of Systems   Review of  Systems  Constitutional: Negative for chills, diaphoresis, fatigue and fever.  HENT: Negative for congestion.   Eyes: Negative for visual disturbance.  Respiratory: Negative for cough, choking, chest tightness and shortness of breath.   Cardiovascular: Negative for chest pain, palpitations and leg swelling.  Gastrointestinal: Positive for nausea and vomiting. Negative for abdominal distention, abdominal pain and diarrhea.  Genitourinary: Negative for flank pain.  Musculoskeletal: Negative for back pain, neck pain and neck stiffness.  Skin: Negative for rash and wound.  Neurological: Positive for facial asymmetry and speech difficulty. Negative for dizziness, tremors, syncope, weakness, light-headedness and headaches.  Psychiatric/Behavioral: Negative for agitation  and confusion.  All other systems reviewed and are negative.   Physical Exam Updated Vital Signs BP (!) 201/98   Pulse 65   Temp 97.7 F (36.5 C) (Oral)   SpO2 99%   Physical Exam Vitals and nursing note reviewed.  Constitutional:      General: She is not in acute distress.    Appearance: She is well-developed and normal weight. She is not ill-appearing, toxic-appearing or diaphoretic.  HENT:     Head: Atraumatic.     Right Ear: External ear normal.     Left Ear: External ear normal.     Nose: Nose normal. No congestion or rhinorrhea.     Mouth/Throat:     Mouth: Mucous membranes are moist.     Pharynx: No oropharyngeal exudate or posterior oropharyngeal erythema.  Eyes:     Conjunctiva/sclera: Conjunctivae normal.     Pupils: Pupils are equal, round, and reactive to light.  Cardiovascular:     Rate and Rhythm: Normal rate and regular rhythm.     Pulses: Normal pulses.     Heart sounds: No murmur.  Pulmonary:     Effort: No respiratory distress.     Breath sounds: No stridor.  Abdominal:     General: There is no distension.     Tenderness: There is no abdominal tenderness. There is no right CVA tenderness, left CVA tenderness or rebound.  Musculoskeletal:        General: No tenderness.     Cervical back: Normal range of motion and neck supple. No tenderness.     Right lower leg: No edema.     Left lower leg: No edema.  Skin:    General: Skin is warm.     Coloration: Skin is not pale.     Findings: No erythema or rash.  Neurological:     Mental Status: She is alert and oriented to person, place, and time.     GCS: GCS eye subscore is 4. GCS verbal subscore is 5. GCS motor subscore is 6.     Cranial Nerves: Dysarthria and facial asymmetry present.     Sensory: Sensation is intact. No sensory deficit.     Motor: No tremor, atrophy, abnormal muscle tone or seizure activity.     Coordination: Finger-Nose-Finger Test normal.     Deep Tendon Reflexes: Reflexes are  normal and symmetric.     Comments: Left lower facial droop sparing her eyebrows.  Normal extraocular movements.  Psychiatric:        Mood and Affect: Mood normal.     ED Results / Procedures / Treatments   Labs (all labs ordered are listed, but only abnormal results are displayed) Labs Reviewed  CBC WITH DIFFERENTIAL/PLATELET - Abnormal; Notable for the following components:      Result Value   Neutro Abs 8.0 (*)    All other  components within normal limits  COMPREHENSIVE METABOLIC PANEL - Abnormal; Notable for the following components:   Potassium 3.3 (*)    Glucose, Bld 109 (*)    All other components within normal limits  RAPID URINE DRUG SCREEN, HOSP PERFORMED - Abnormal; Notable for the following components:   Tetrahydrocannabinol POSITIVE (*)    All other components within normal limits  URINALYSIS, ROUTINE W REFLEX MICROSCOPIC - Abnormal; Notable for the following components:   APPearance HAZY (*)    Ketones, ur 80 (*)    Protein, ur 100 (*)    Bacteria, UA RARE (*)    All other components within normal limits  URINE CULTURE  SARS CORONAVIRUS 2 (TAT 6-24 HRS)  TSH  ETHANOL  LIPASE, BLOOD  LACTIC ACID, PLASMA  HEMOGLOBIN A1C  LIPID PANEL  HIV ANTIBODY (ROUTINE TESTING W REFLEX)    EKG EKG Interpretation  Date/Time:  Thursday May 04 2019 15:43:21 EDT Ventricular Rate:  58 PR Interval:    QRS Duration: 90 QT Interval:  525 QTC Calculation: 516 R Axis:   82 Text Interpretation: Sinus rhythm Borderline right axis deviation Borderline repol abnormality, diffuse leads Prolonged QT interval No prior ECG for comparison. Long QTC. Abnormal ST segments. NO STEMI Confirmed by Antony Blackbird [phone]) on 05/04/2019 3:55:21 PM   Radiology CT Head Wo Contrast  Result Date: 05/04/2019 CLINICAL DATA:  Neuro deficit, acute, stroke suspected. Additional history provided: Patient transported via EMS from home for slurred speech and left-sided facial droop, symptoms developed  Tuesday night (unknown time). History of stroke. EXAM: CT HEAD WITHOUT CONTRAST TECHNIQUE: Contiguous axial images were obtained from the base of the skull through the vertex without intravenous contrast. COMPARISON:  No pertinent prior studies available for comparison. FINDINGS: Brain: Mildly motion degraded examination. Age-indeterminate infarct within the right corona radiata/basal ganglia. There is an additional age-indeterminate infarct more anteriorly within the right basal ganglia also involve the adjacent deep right frontal white matter. There is no evidence of acute intracranial hemorrhage. No midline shift or extra-axial fluid collection. Background mild ill-defined hypoattenuation within the cerebral white matter is nonspecific, but consistent with chronic small vessel ischemic disease. Mild generalized parenchymal atrophy. Vascular: No hyperdense vessel is identified. Atherosclerotic calcifications. Skull: Normal. Negative for fracture or focal lesion. Sinuses/Orbits: Visualized orbits demonstrate no acute abnormality. No significant paranasal sinus disease or mastoid effusion at the imaged levels. IMPRESSION: Mildly motion degraded examination. Age-indeterminate infarcts within the right frontal lobe white matter and right basal ganglia as described, possibly acute or subacute. Brain MRI is suggested for further evaluation. Background mild generalized parenchymal atrophy and chronic small vessel ischemic disease. Electronically Signed   By: Kellie Simmering DO   On: 05/04/2019 17:24   MR BRAIN WO CONTRAST  Result Date: 05/04/2019 CLINICAL DATA:  Left facial droop and dysarthria noted over the last 3 days. EXAM: MRI HEAD WITHOUT CONTRAST TECHNIQUE: Multiplanar, multiecho pulse sequences of the brain and surrounding structures were obtained without intravenous contrast. COMPARISON:  Head CT same day FINDINGS: Brain: Motion degraded study. No acute finding affects the brainstem or cerebellum. 8 mm acute  infarction in the posterior limb internal capsule on the right. Punctate acute infarction in the left caudate head or white matter adjacent to the frontal horn of the lateral ventricle. 8 mm acute to subacute infarction in the white matter adjacent to the atrium of the right lateral ventricle. Other white matter disease seen at CT is not acute or subacute. Old right basal ganglia infarction. No hydrocephalus.  No sign of gross hemorrhage. No extra-axial collection. IMPRESSION: Diffusion only study is degraded by motion. 8 mm acute infarction in the posterior limb internal capsule on the right. Punctate acute infarction either in the left caudate head or white matter adjacent to the frontal horn of the left lateral ventricle. 8 mm acute to subacute infarction in the white matter adjacent to the atrium of the right lateral ventricle. Old small vessel infarctions elsewhere affecting the hemispheric white matter and right basal ganglia. Electronically Signed   By: Nelson Chimes M.D.   On: 05/04/2019 20:58    Procedures Procedures (including critical care time)  Medications Ordered in ED Medications  amLODipine (NORVASC) tablet 10 mg (has no administration in time range)  doxylamine (Sleep) (UNISOM) tablet 25 mg (has no administration in time range)  escitalopram (LEXAPRO) tablet 20 mg (has no administration in time range)   stroke: mapping our early stages of recovery book (has no administration in time range)  0.9 %  sodium chloride infusion ( Intravenous New Bag/Given 05/04/19 2243)  acetaminophen (TYLENOL) tablet 650 mg (has no administration in time range)    Or  acetaminophen (TYLENOL) 160 MG/5ML solution 650 mg (has no administration in time range)    Or  acetaminophen (TYLENOL) suppository 650 mg (has no administration in time range)  senna-docusate (Senokot-S) tablet 1 tablet (has no administration in time range)  enoxaparin (LOVENOX) injection 40 mg (has no administration in time range)  aspirin  EC tablet 325 mg (has no administration in time range)  aspirin suppository 300 mg (has no administration in time range)  simvastatin (ZOCOR) tablet 20 mg (has no administration in time range)  labetalol (NORMODYNE) injection 20 mg (has no administration in time range)  LORazepam (ATIVAN) injection 1 mg (has no administration in time range)  sodium chloride 0.9 % 1,000 mL with thiamine 123XX123 mg, folic acid 1 mg, multivitamins adult 10 mL infusion (has no administration in time range)  LORazepam (ATIVAN) injection 2 mg (2 mg Intravenous Given 05/04/19 1740)    ED Course  I have reviewed the triage vital signs and the nursing notes.  Pertinent labs & imaging results that were available during my care of the patient were reviewed by me and considered in my medical decision making (see chart for details).    MDM Rules/Calculators/A&P                      Stacie [name] is a 64 y.o. female with a past medical history significant for hypertension, PAD, hyperlipidemia, prior stroke with no residual symptoms reported, colitis, thyroid disease, and report of both alcohol and tobacco abuse who presents with stroke symptoms.  Patient is brought by EMS who reports that for the last 3 days, patient has had left-sided lower facial droop and slurred speech.  Patient reports that she went to bed at some point on Monday evening and then woke up Tuesday with the symptoms.  She also reported rolled out of bed but did not hit her head.  She denies any headache, neck pain, neck stiffness.  She denies any vision changes or pain.  She does report that she has had some nausea and vomiting on and off.  She does report she has not had any alcohol for the last few days but is never had alcohol withdrawals seizures in the past.  She reports no difficulty with numbness, tingling, or weakness of extremities.  She denies difficulty with coordination but reports she she has  had some difficulty walking.  She has having nausea and  vomiting during the initial evaluation.  On exam, lungs are clear and chest is nontender.  Abdomen is nontender.  She moving all extremities with normal sensation and strength.  Good pulses in extremities.  Normal finger-nose-finger testing bilaterally.  Pupils are symmetric and reactive with normal extraocular wounds.  Patient has left-sided lower facial droop that is sparing her eyebrows.  Normal sensation of the face.  Patient does have dysarthria but seems to be getting her words out and is not aphasic.  No murmur.  Exam is otherwise unremarkable.  I have not yet ambulate patient due to the nausea and vomiting that is ongoing.  Patient was given nausea medication in triage.  EKG shows no STEMI but does show abnormal ST segments as well as prolonged QTC.  Will hold on further QT prolonging medications and will give Ativan if needed.  Will get CIWA on her.  Patient will get CT head initially given the possible fall, elevated blood pressure, and normal low heart rate.  Low suspicion for subdural but will check for this.  Anticipate getting MRI if CT is reassuring as I am very concerned about stroke given her history of stroke.  Anticipate reassessment after work-up.  9:35 PM CT head was concerning for possible stroke.  MRI confirmed acute strokes.  Neurology called who recommended admission to medicine.  Medicine team called for admission for possible embolic stroke.   Final Clinical Impression(s) / ED Diagnoses Final diagnoses:  Cerebrovascular accident (CVA), unspecified mechanism ([place])  Facial droop due to acute stroke ([place])     Clinical Impression: 1. Cerebrovascular accident (CVA), unspecified mechanism ([place])   2. Facial droop due to acute stroke [facility])     Disposition: Admit  This note was prepared with assistance of Dragon voice recognition software. Occasional wrong-word or sound-a-like substitutions may have occurred due to the inherent limitations of voice recognition  software.      Achsah Mcquade, Gwenyth Allegra, MD 05/04/19 [phone]

## 2019-05-05 ENCOUNTER — Ambulatory Visit (HOSPITAL_COMMUNITY): Payer: Medicare Other

## 2019-05-05 ENCOUNTER — Observation Stay (HOSPITAL_COMMUNITY): Payer: Medicare Other

## 2019-05-05 DIAGNOSIS — Y900 Blood alcohol level of less than 20 mg/100 ml: Secondary | ICD-10-CM | POA: Diagnosis present

## 2019-05-05 DIAGNOSIS — R636 Underweight: Secondary | ICD-10-CM | POA: Diagnosis present

## 2019-05-05 DIAGNOSIS — R2681 Unsteadiness on feet: Secondary | ICD-10-CM | POA: Diagnosis present

## 2019-05-05 DIAGNOSIS — Z87891 Personal history of nicotine dependence: Secondary | ICD-10-CM | POA: Diagnosis present

## 2019-05-05 DIAGNOSIS — I69391 Dysphagia following cerebral infarction: Secondary | ICD-10-CM | POA: Diagnosis not present

## 2019-05-05 DIAGNOSIS — F339 Major depressive disorder, recurrent, unspecified: Secondary | ICD-10-CM | POA: Diagnosis not present

## 2019-05-05 DIAGNOSIS — I639 Cerebral infarction, unspecified: Secondary | ICD-10-CM | POA: Diagnosis not present

## 2019-05-05 DIAGNOSIS — H9190 Unspecified hearing loss, unspecified ear: Secondary | ICD-10-CM | POA: Diagnosis present

## 2019-05-05 DIAGNOSIS — Z8249 Family history of ischemic heart disease and other diseases of the circulatory system: Secondary | ICD-10-CM | POA: Diagnosis not present

## 2019-05-05 DIAGNOSIS — K5900 Constipation, unspecified: Secondary | ICD-10-CM | POA: Diagnosis not present

## 2019-05-05 DIAGNOSIS — I5032 Chronic diastolic (congestive) heart failure: Secondary | ICD-10-CM | POA: Diagnosis present

## 2019-05-05 DIAGNOSIS — S51811D Laceration without foreign body of right forearm, subsequent encounter: Secondary | ICD-10-CM | POA: Diagnosis not present

## 2019-05-05 DIAGNOSIS — Z720 Tobacco use: Secondary | ICD-10-CM

## 2019-05-05 DIAGNOSIS — F1091 Alcohol use, unspecified, in remission: Secondary | ICD-10-CM | POA: Diagnosis present

## 2019-05-05 DIAGNOSIS — E876 Hypokalemia: Secondary | ICD-10-CM | POA: Diagnosis not present

## 2019-05-05 DIAGNOSIS — R4781 Slurred speech: Secondary | ICD-10-CM | POA: Diagnosis present

## 2019-05-05 DIAGNOSIS — I160 Hypertensive urgency: Secondary | ICD-10-CM | POA: Diagnosis present

## 2019-05-05 DIAGNOSIS — I63233 Cerebral infarction due to unspecified occlusion or stenosis of bilateral carotid arteries: Secondary | ICD-10-CM | POA: Diagnosis not present

## 2019-05-05 DIAGNOSIS — I69354 Hemiplegia and hemiparesis following cerebral infarction affecting left non-dominant side: Secondary | ICD-10-CM | POA: Diagnosis not present

## 2019-05-05 DIAGNOSIS — N390 Urinary tract infection, site not specified: Secondary | ICD-10-CM | POA: Diagnosis not present

## 2019-05-05 DIAGNOSIS — R1310 Dysphagia, unspecified: Secondary | ICD-10-CM | POA: Diagnosis not present

## 2019-05-05 DIAGNOSIS — R140 Abdominal distension (gaseous): Secondary | ICD-10-CM | POA: Diagnosis not present

## 2019-05-05 DIAGNOSIS — E785 Hyperlipidemia, unspecified: Secondary | ICD-10-CM | POA: Diagnosis not present

## 2019-05-05 DIAGNOSIS — E079 Disorder of thyroid, unspecified: Secondary | ICD-10-CM | POA: Diagnosis present

## 2019-05-05 DIAGNOSIS — F17210 Nicotine dependence, cigarettes, uncomplicated: Secondary | ICD-10-CM | POA: Diagnosis present

## 2019-05-05 DIAGNOSIS — I63511 Cerebral infarction due to unspecified occlusion or stenosis of right middle cerebral artery: Secondary | ICD-10-CM | POA: Diagnosis not present

## 2019-05-05 DIAGNOSIS — I739 Peripheral vascular disease, unspecified: Secondary | ICD-10-CM | POA: Diagnosis not present

## 2019-05-05 DIAGNOSIS — I10 Essential (primary) hypertension: Secondary | ICD-10-CM | POA: Diagnosis not present

## 2019-05-05 DIAGNOSIS — F32A Depression, unspecified: Secondary | ICD-10-CM | POA: Diagnosis present

## 2019-05-05 DIAGNOSIS — F329 Major depressive disorder, single episode, unspecified: Secondary | ICD-10-CM | POA: Diagnosis present

## 2019-05-05 DIAGNOSIS — E43 Unspecified severe protein-calorie malnutrition: Secondary | ICD-10-CM

## 2019-05-05 DIAGNOSIS — F1010 Alcohol abuse, uncomplicated: Secondary | ICD-10-CM | POA: Diagnosis not present

## 2019-05-05 DIAGNOSIS — Z823 Family history of stroke: Secondary | ICD-10-CM | POA: Diagnosis not present

## 2019-05-05 DIAGNOSIS — R29810 Facial weakness: Secondary | ICD-10-CM | POA: Diagnosis present

## 2019-05-05 DIAGNOSIS — Z888 Allergy status to other drugs, medicaments and biological substances status: Secondary | ICD-10-CM | POA: Diagnosis not present

## 2019-05-05 DIAGNOSIS — Z681 Body mass index (BMI) 19 or less, adult: Secondary | ICD-10-CM | POA: Diagnosis not present

## 2019-05-05 DIAGNOSIS — K529 Noninfective gastroenteritis and colitis, unspecified: Secondary | ICD-10-CM | POA: Diagnosis present

## 2019-05-05 DIAGNOSIS — R110 Nausea: Secondary | ICD-10-CM | POA: Diagnosis not present

## 2019-05-05 DIAGNOSIS — R32 Unspecified urinary incontinence: Secondary | ICD-10-CM | POA: Diagnosis present

## 2019-05-05 DIAGNOSIS — G459 Transient cerebral ischemic attack, unspecified: Secondary | ICD-10-CM | POA: Diagnosis not present

## 2019-05-05 DIAGNOSIS — Z885 Allergy status to narcotic agent status: Secondary | ICD-10-CM | POA: Diagnosis not present

## 2019-05-05 DIAGNOSIS — R29702 NIHSS score 2: Secondary | ICD-10-CM | POA: Diagnosis present

## 2019-05-05 DIAGNOSIS — Z20822 Contact with and (suspected) exposure to covid-19: Secondary | ICD-10-CM | POA: Diagnosis present

## 2019-05-05 DIAGNOSIS — I69322 Dysarthria following cerebral infarction: Secondary | ICD-10-CM | POA: Diagnosis not present

## 2019-05-05 DIAGNOSIS — G4700 Insomnia, unspecified: Secondary | ICD-10-CM | POA: Diagnosis not present

## 2019-05-05 DIAGNOSIS — Z1612 Extended spectrum beta lactamase (ESBL) resistance: Secondary | ICD-10-CM | POA: Diagnosis present

## 2019-05-05 DIAGNOSIS — F1290 Cannabis use, unspecified, uncomplicated: Secondary | ICD-10-CM | POA: Diagnosis present

## 2019-05-05 DIAGNOSIS — F1910 Other psychoactive substance abuse, uncomplicated: Secondary | ICD-10-CM | POA: Diagnosis not present

## 2019-05-05 DIAGNOSIS — Z79899 Other long term (current) drug therapy: Secondary | ICD-10-CM | POA: Diagnosis not present

## 2019-05-05 DIAGNOSIS — S51812D Laceration without foreign body of left forearm, subsequent encounter: Secondary | ICD-10-CM | POA: Diagnosis not present

## 2019-05-05 DIAGNOSIS — R112 Nausea with vomiting, unspecified: Secondary | ICD-10-CM | POA: Diagnosis present

## 2019-05-05 DIAGNOSIS — Z87898 Personal history of other specified conditions: Secondary | ICD-10-CM | POA: Diagnosis present

## 2019-05-05 DIAGNOSIS — E782 Mixed hyperlipidemia: Secondary | ICD-10-CM | POA: Diagnosis not present

## 2019-05-05 DIAGNOSIS — I110 Hypertensive heart disease with heart failure: Secondary | ICD-10-CM | POA: Diagnosis present

## 2019-05-05 LAB — LIPID PANEL
Cholesterol: 198 mg/dL (ref 0–200)
HDL: 50 mg/dL (ref >40)
LDL Cholesterol: 125 mg/dL — ABNORMAL HIGH (ref 0–99)
Total CHOL/HDL Ratio: 4 RATIO
Triglycerides: 113 mg/dL (ref <150)
VLDL: 23 mg/dL (ref 0–40)

## 2019-05-05 LAB — SARS CORONAVIRUS 2 (TAT 6-24 HRS): SARS Coronavirus 2: NEGATIVE

## 2019-05-05 LAB — HIV ANTIBODY (ROUTINE TESTING W REFLEX): HIV Screen 4th Generation wRfx: NONREACTIVE

## 2019-05-05 LAB — HEMOGLOBIN A1C
Hgb A1c MFr Bld: 5.5 % (ref 4.8–5.6)
Mean Plasma Glucose: 111.15 mg/dL

## 2019-05-05 LAB — ECHOCARDIOGRAM COMPLETE

## 2019-05-05 MED ORDER — NICOTINE 14 MG/24HR TD PT24
14.0000 mg | MEDICATED_PATCH | Freq: Every day | TRANSDERMAL | Status: DC
  Administered 2019-05-05 – 2019-05-12 (×8): 14 mg via TRANSDERMAL
  Filled 2019-05-05 (×8): qty 1

## 2019-05-05 MED ORDER — CLOPIDOGREL BISULFATE 75 MG PO TABS
75.0000 mg | ORAL_TABLET | Freq: Every day | ORAL | Status: DC
  Administered 2019-05-05 – 2019-05-12 (×8): 75 mg via ORAL
  Filled 2019-05-05 (×8): qty 1

## 2019-05-05 MED ORDER — ASPIRIN EC 325 MG PO TBEC
325.0000 mg | DELAYED_RELEASE_TABLET | Freq: Every day | ORAL | Status: DC
  Administered 2019-05-05: 325 mg via ORAL
  Filled 2019-05-05: qty 1

## 2019-05-05 MED ORDER — LABETALOL HCL 5 MG/ML IV SOLN
20.0000 mg | INTRAVENOUS | Status: DC | PRN
  Filled 2019-05-05: qty 4

## 2019-05-05 MED ORDER — IOHEXOL 350 MG/ML SOLN
100.0000 mL | Freq: Once | INTRAVENOUS | Status: AC | PRN
  Administered 2019-05-05: 100 mL via INTRAVENOUS

## 2019-05-05 MED ORDER — ASPIRIN EC 81 MG PO TBEC
81.0000 mg | DELAYED_RELEASE_TABLET | Freq: Every day | ORAL | Status: DC
  Administered 2019-05-06 – 2019-05-12 (×7): 81 mg via ORAL
  Filled 2019-05-05 (×7): qty 1

## 2019-05-05 MED ORDER — ASPIRIN 300 MG RE SUPP: 300.0000 mg | Freq: Every day | RECTAL | Status: DC

## 2019-05-05 NOTE — Progress Notes (Addendum)
PROGRESS NOTE  Stacie [name] [id number] DOB: 1955/06/03 DOA: 05/04/2019 PCP: [name], NP  HPI/Recap of past 43 hours: 64 year old female with past medical history of hypertension, polysubstance abuse and previous CVA presented to the emergency room on the morning of Saturday, 3/27, with acute onset of left facial droop with difficulty speaking and weakness in both legs.  Patient thought that this might go away on its own and did not immediately seek medical attention.  Symptoms continue to persist.  She stopped drinking a day later.  When she came into the emergency room finally on the morning of 4/1, she is noted to have a blood pressure of 201/90.  Head CT noted age-indeterminate infarct within the right frontal lobe, white matter and right basal ganglia possibly acute or subacute.  MRI noted an 8 mm acute infarct in the posterior limb of the internal capsule on the right with punctate acute infarction in either the left caudate head or white matter adjacent to the frontal horn of the left lateral ventricle and 8 mm acute to subacute infarction of the white matter adjacent to the atrium of the right lateral ventricle plus old small vessel infarctions.  Patient was brought in the hospital service for further evaluation.  Neurology was consulted.  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  Work-up revealed an A1c which was normal at 5.5, but an LDL of 125.  Echocardiogram noted no evidence of embolus.  Patient previous had not been on any type of antiplatelet agent and started on aspirin 81 mg daily and Plavix was added for 3 weeks.  Patient seen by PT and OT recommending inpatient rehab.  Patient advised to stop drinking and smoking, including marijuana.  Active Problems:   Essential hypertension: Allowing for  permissive hypertension.  Better blood pressure control long-term.  Stopping her alcohol will help with this.    PAD (peripheral artery disease) ([place]): Aspirin should help with this.    Severe protein-calorie malnutrition ([place]): BMI of 15.  Nutrition to see.    Depression: Continue SSRI.    Tobacco abuse: Nicotine patch.  Polysubstance including alcohol and marijuana use: Providing patient counseling options to quit.  Since she decided to come in 5 days after symptoms started, she is already outside of the window for withdrawals     Chronic diastolic CHF (congestive heart failure) ([place]): Currently euvolemic.  Incidentally noted on echocardiogram  Hyperlipidemia: As above, have started statin.  Code Status: Full code  Family Communication: Left message for daughter's  Disposition Plan: Awaiting CIR approval   Consultants:  CIR  Neurology  Procedures:  Echocardiogram done 4/2 noting diastolic dysfunction, no evidence of thrombus  Antimicrobials:  None  DVT prophylaxis: SCDs   Objective: Vitals:   05/05/19 1202 05/05/19 1602  BP: (!) 156/74 (!) 153/67  Pulse: (!) 59 (!) 57  Resp: 16 18  Temp: 98.3 F (36.8 C) 98.3 F (36.8 C)  SpO2: 97% 96%   No intake or output data in the 24 hours ending 05/05/19 1737 There were no vitals filed for this visit. There is no height or weight on file to calculate BMI.  Exam:   General: Drowsy, emaciated  HEENT: Normocephalic atraumatic, mucous membrane slightly dry  Neck: Supple, no JVD  Cardiovascular: Regular rate and rhythm, [id number], 2-6 systolic ejection murmur  Respiratory: Clear to auscultation bilaterally  Abdomen: Soft, nontender, nondistended, positive  bowel sounds  Musculoskeletal: No clubbing or cyanosis or edema  Skin: No skin breaks or tears or lesions  Psychiatry: Withdrawn, flattened affect, slightly somnolent  Neuro: Limited exam due to patient's somnolence   Data Reviewed: CBC: Recent Labs    Lab 05/04/19 1536  WBC 10.0  NEUTROABS 8.0*  HGB 14.0  HCT 44.8  MCV 90.1  PLT 99991111   Basic Metabolic Panel: Recent Labs  Lab 05/04/19 1536  NA 139  K 3.3*  CL 103  CO2 22  GLUCOSE 109*  BUN 16  CREATININE 0.81  CALCIUM 9.1   GFR: CrCl cannot be calculated (Unknown ideal weight.). Liver Function Tests: Recent Labs  Lab 05/04/19 1536  AST 25  ALT 16  ALKPHOS 81  BILITOT 1.0  PROT 6.9  ALBUMIN 4.1   Recent Labs  Lab 05/04/19 1536  LIPASE 26   No results for input(s): AMMONIA in the last 168 hours. Coagulation Profile: No results for input(s): INR, PROTIME in the last 168 hours. Cardiac Enzymes: No results for input(s): CKTOTAL, CKMB, CKMBINDEX, TROPONINI in the last 168 hours. BNP (last 3 results) No results for input(s): PROBNP in the last 8760 hours. HbA1C: Recent Labs    05/05/19 0538  HGBA1C 5.5   CBG: No results for input(s): GLUCAP in the last 168 hours. Lipid Profile: Recent Labs    05/05/19 0538  CHOL 198  HDL 50  LDLCALC 125*  TRIG 113  CHOLHDL 4.0   Thyroid Function Tests: Recent Labs    05/04/19 1536  TSH 1.609   Anemia Panel: No results for input(s): VITAMINB12, FOLATE, FERRITIN, TIBC, IRON, RETICCTPCT in the last 72 hours. Urine analysis:    Component Value Date/Time   COLORURINE YELLOW 05/04/2019 1730   APPEARANCEUR HAZY (A) 05/04/2019 1730   LABSPEC 1.024 05/04/2019 1730   PHURINE 5.0 05/04/2019 1730   GLUCOSEU NEGATIVE 05/04/2019 1730   HGBUR NEGATIVE 05/04/2019 1730   BILIRUBINUR NEGATIVE 05/04/2019 1730   KETONESUR 80 (A) 05/04/2019 1730   PROTEINUR 100 (A) 05/04/2019 1730   NITRITE NEGATIVE 05/04/2019 1730   LEUKOCYTESUR NEGATIVE 05/04/2019 1730   Sepsis Labs: @LABRCNTIP (procalcitonin:4,lacticidven:4)  ) Recent Results (from the past 240 hour(s))  Urine culture     Status: Abnormal (Preliminary result)   Collection Time: 05/04/19  5:22 PM   Specimen: Urine, Random  Result Value Ref Range Status    Specimen Description URINE, RANDOM  Final   Special Requests NONE  Final   Culture (A)  Final    70,000 COLONIES/mL ESCHERICHIA COLI SUSCEPTIBILITIES TO FOLLOW Performed at [facility], [place] [address] [place], [place] [address]    Report Status PENDING  Incomplete  SARS CORONAVIRUS 2 (TAT 6-24 HRS) Nasopharyngeal Nasopharyngeal Swab     Status: None   Collection Time: 05/04/19 10:46 PM   Specimen: Nasopharyngeal Swab  Result Value Ref Range Status   SARS Coronavirus 2 NEGATIVE NEGATIVE Final    Comment: (NOTE) SARS-CoV-2 target nucleic acids are NOT DETECTED. The SARS-CoV-2 RNA is generally detectable in upper and lower respiratory specimens during the acute phase of infection. Negative results do not preclude SARS-CoV-2 infection, do not rule out co-infections with other pathogens, and should not be used as the sole basis for treatment or other patient management decisions. Negative results must be combined with clinical observations, patient history, and epidemiological information. The expected result is Negative. Fact Sheet for Patients: SugarRoll.be Fact Sheet for Healthcare Providers: https://www.woods-mathews.com/ This test is not yet approved or cleared by the  [place] Architectural technologist and  has been authorized for detection and/or diagnosis of SARS-CoV-2 by FDA under an Print production planner (EUA). This EUA will remain  in effect (meaning this test can be used) for the duration of the COVID-19 declaration under Section 56 4(b)(1) of the Act, 21 U.S.C. section 360bbb-3(b)(1), unless the authorization is terminated or revoked sooner. Performed at [facility], [place] [address], [place], [place] [address]       Studies: CT ANGIO HEAD W OR WO CONTRAST  Result Date: 05/05/2019 CLINICAL DATA:  Stroke follow-up EXAM: CT ANGIOGRAPHY HEAD AND NECK TECHNIQUE: Multidetector CT imaging of the head and neck was performed using the  standard protocol during bolus administration of intravenous contrast. Multiplanar CT image reconstructions and MIPs were obtained to evaluate the vascular anatomy. Carotid stenosis measurements (when applicable) are obtained utilizing NASCET criteria, using the distal internal carotid diameter as the denominator. CONTRAST:  145mL OMNIPAQUE IOHEXOL 350 MG/ML SOLN COMPARISON:  Brain MRI from yesterday FINDINGS: CTA NECK FINDINGS Aortic arch: Atherosclerotic plaque. No acute finding or dilatation. Three vessel branching. Right carotid system: Mixed density plaque at the common carotid bifurcation with up to 40% stenosis. No ulceration. There is ICA tortuosity with mild kinking. Left carotid system: Mixed density plaque at the bifurcation with bulb stenosis measuring up to 30%. No beading or ulceration. Vertebral arteries: Proximal subclavian atheromatous plaque on the left. The left vertebral artery is strongly dominant. Both vertebral arteries are widely patent to the dura. Skeleton: Prominent cervical facet spurring which is generalized and prominent. No acute or aggressive finding. Other neck: Negative Upper chest: Biapical pleural based scarring. Review of the MIP images confirms the above findings CTA HEAD FINDINGS Anterior circulation: Scattered atherosclerotic plaque on the carotid siphons without focal or flow reducing stenosis. No major branch occlusion. No aneurysm or vascular malformation. Posterior circulation: Strong left vertebral artery dominance. Most of right vertebral flow is to the PICA. Atheromatous irregularity of the bilateral posterior cerebral arteries which is mild to moderate. No branch occlusion or aneurysm. Venous sinuses: Unremarkable in the arterial phase. Anatomic variants: None significant Review of the MIP images confirms the above findings IMPRESSION: 1. No emergent finding. 2. Cervical and intracranial atherosclerosis without flow reducing stenosis or ulceration of major vessels.  Electronically Signed   By: Monte Fantasia M.D.   On: 05/05/2019 04:34   CT ANGIO NECK W OR WO CONTRAST  Result Date: 05/05/2019 CLINICAL DATA:  Stroke follow-up EXAM: CT ANGIOGRAPHY HEAD AND NECK TECHNIQUE: Multidetector CT imaging of the head and neck was performed using the standard protocol during bolus administration of intravenous contrast. Multiplanar CT image reconstructions and MIPs were obtained to evaluate the vascular anatomy. Carotid stenosis measurements (when applicable) are obtained utilizing NASCET criteria, using the distal internal carotid diameter as the denominator. CONTRAST:  181mL OMNIPAQUE IOHEXOL 350 MG/ML SOLN COMPARISON:  Brain MRI from yesterday FINDINGS: CTA NECK FINDINGS Aortic arch: Atherosclerotic plaque. No acute finding or dilatation. Three vessel branching. Right carotid system: Mixed density plaque at the common carotid bifurcation with up to 40% stenosis. No ulceration. There is ICA tortuosity with mild kinking. Left carotid system: Mixed density plaque at the bifurcation with bulb stenosis measuring up to 30%. No beading or ulceration. Vertebral arteries: Proximal subclavian atheromatous plaque on the left. The left vertebral artery is strongly dominant. Both vertebral arteries are widely patent to the dura. Skeleton: Prominent cervical facet spurring which is generalized and prominent. No acute or aggressive finding. Other neck: Negative  Upper chest: Biapical pleural based scarring. Review of the MIP images confirms the above findings CTA HEAD FINDINGS Anterior circulation: Scattered atherosclerotic plaque on the carotid siphons without focal or flow reducing stenosis. No major branch occlusion. No aneurysm or vascular malformation. Posterior circulation: Strong left vertebral artery dominance. Most of right vertebral flow is to the PICA. Atheromatous irregularity of the bilateral posterior cerebral arteries which is mild to moderate. No branch occlusion or aneurysm. Venous  sinuses: Unremarkable in the arterial phase. Anatomic variants: None significant Review of the MIP images confirms the above findings IMPRESSION: 1. No emergent finding. 2. Cervical and intracranial atherosclerosis without flow reducing stenosis or ulceration of major vessels. Electronically Signed   By: Monte Fantasia M.D.   On: 05/05/2019 04:34   MR BRAIN WO CONTRAST  Result Date: 05/04/2019 CLINICAL DATA:  Left facial droop and dysarthria noted over the last 3 days. EXAM: MRI HEAD WITHOUT CONTRAST TECHNIQUE: Multiplanar, multiecho pulse sequences of the brain and surrounding structures were obtained without intravenous contrast. COMPARISON:  Head CT same day FINDINGS: Brain: Motion degraded study. No acute finding affects the brainstem or cerebellum. 8 mm acute infarction in the posterior limb internal capsule on the right. Punctate acute infarction in the left caudate head or white matter adjacent to the frontal horn of the lateral ventricle. 8 mm acute to subacute infarction in the white matter adjacent to the atrium of the right lateral ventricle. Other white matter disease seen at CT is not acute or subacute. Old right basal ganglia infarction. No hydrocephalus. No sign of gross hemorrhage. No extra-axial collection. IMPRESSION: Diffusion only study is degraded by motion. 8 mm acute infarction in the posterior limb internal capsule on the right. Punctate acute infarction either in the left caudate head or white matter adjacent to the frontal horn of the left lateral ventricle. 8 mm acute to subacute infarction in the white matter adjacent to the atrium of the right lateral ventricle. Old small vessel infarctions elsewhere affecting the hemispheric white matter and right basal ganglia. Electronically Signed   By: Nelson Chimes M.D.   On: 05/04/2019 20:58   ECHOCARDIOGRAM COMPLETE  Result Date: 05/05/2019    ECHOCARDIOGRAM REPORT   Patient Name:   KEINA BODEN Date of Exam: 05/05/2019 Medical Rec #:   [id number]       Height:       64.6 in Accession #:    [id number]      Weight:       89.1 lb Date of Birth:  October 29, 1955        BSA:          1.396 m Patient Age:    44 years        BP:           156/74 mmHg Patient Gender: F               HR:           59 bpm. Exam Location:  Inpatient Procedure: 2D Echo Indications:    TIA 435.9 / G45.9  History:        Patient has no prior history of Echocardiogram examinations.                 Stroke; Risk Factors:Dyslipidemia and Hypertension.  Sonographer:    Vikki Ports Turrentine Referring Phys: [id number] JAN A Boscobel  1. Normal LV systolic function; grade 1 diastolic dysfunction.  2. Left ventricular ejection fraction, by estimation, is 60 to 65%. The  left ventricle has normal function. The left ventricle has no regional wall motion abnormalities. Left ventricular diastolic parameters are consistent with Grade I diastolic dysfunction (impaired relaxation).  3. Right ventricular systolic function is normal. The right ventricular size is normal.  4. The mitral valve is normal in structure. Trivial mitral valve regurgitation. No evidence of mitral stenosis.  5. The aortic valve is tricuspid. Aortic valve regurgitation is not visualized. No aortic stenosis is present.  6. The inferior vena cava is normal in size with greater than 50% respiratory variability, suggesting right atrial pressure of 3 mmHg. FINDINGS  Left Ventricle: Left ventricular ejection fraction, by estimation, is 60 to 65%. The left ventricle has normal function. The left ventricle has no regional wall motion abnormalities. The left ventricular internal cavity size was normal in size. There is  no left ventricular hypertrophy. Left ventricular diastolic parameters are consistent with Grade I diastolic dysfunction (impaired relaxation). Right Ventricle: The right ventricular size is normal. Right ventricular systolic function is normal. Left Atrium: Left atrial size was normal in size. Right Atrium: Right  atrial size was normal in size. Pericardium: There is no evidence of pericardial effusion. Mitral Valve: The mitral valve is normal in structure. Normal mobility of the mitral valve leaflets. Trivial mitral valve regurgitation. No evidence of mitral valve stenosis. Tricuspid Valve: The tricuspid valve is normal in structure. Tricuspid valve regurgitation is trivial. No evidence of tricuspid stenosis. Aortic Valve: The aortic valve is tricuspid. Aortic valve regurgitation is not visualized. No aortic stenosis is present. Aortic valve mean gradient measures 4.0 mmHg. Aortic valve peak gradient measures 8.0 mmHg. Aortic valve area, by VTI measures 2.11 cm. Pulmonic Valve: The pulmonic valve was normal in structure. Pulmonic valve regurgitation is not visualized. No evidence of pulmonic stenosis. Aorta: The aortic root is normal in size and structure. Venous: The inferior vena cava is normal in size with greater than 50% respiratory variability, suggesting right atrial pressure of 3 mmHg.  Additional Comments: Normal LV systolic function; grade 1 diastolic dysfunction.  LEFT VENTRICLE PLAX 2D LVIDd:         3.90 cm  Diastology LVIDs:         2.60 cm  LV e' lateral:   10.80 cm/s LV PW:         0.90 cm  LV E/e' lateral: 8.6 LV IVS:        0.90 cm  LV e' medial:    7.07 cm/s LVOT diam:     1.90 cm  LV E/e' medial:  13.2 LV SV:         72 LV SV Index:   52 LVOT Area:     2.84 cm  RIGHT VENTRICLE RV S prime:     12.70 cm/s TAPSE (M-mode): 2.2 cm LEFT ATRIUM             Index LA diam:        2.80 cm 2.01 cm/m LA Vol (A2C):   35.4 ml 25.35 ml/m LA Vol (A4C):   38.9 ml 27.86 ml/m LA Biplane Vol: 39.7 ml 28.43 ml/m  AORTIC VALVE AV Area (Vmax):    2.27 cm AV Area (Vmean):   2.26 cm AV Area (VTI):     2.11 cm AV Vmax:           141.00 cm/s AV Vmean:          97.700 cm/s AV VTI:            0.342 m AV  Peak Grad:      8.0 mmHg AV Mean Grad:      4.0 mmHg LVOT Vmax:         113.00 cm/s LVOT Vmean:        78.000 cm/s LVOT  VTI:          0.255 m LVOT/AV VTI ratio: 0.75  AORTA Ao Root diam: 2.90 cm MITRAL VALVE MV Area (PHT): 2.66 cm    SHUNTS MV Decel Time: 285 msec    Systemic VTI:  0.26 m MV E velocity: 93.10 cm/s  Systemic Diam: 1.90 cm MV A velocity: 99.90 cm/s MV E/A ratio:  0.93 Kirk Ruths MD Electronically signed by Kirk Ruths MD Signature Date/Time: 05/05/2019/2:17:46 PM    Final     Scheduled Meds: .  stroke: mapping our early stages of recovery book   Does not apply Once  . amLODipine  10 mg Oral Daily  . [START ON 05/06/2019] aspirin EC  81 mg Oral Daily  . clopidogrel  75 mg Oral Daily  . enoxaparin (LOVENOX) injection  40 mg Subcutaneous Q24H  . escitalopram  20 mg Oral Daily  . nicotine  14 mg Transdermal Daily  . simvastatin  20 mg Oral q1800    Continuous Infusions: . sodium chloride 100 mL/hr at 05/04/19 2243     LOS: 0 days     Annita Brod, MD [facility]   05/05/2019, 5:37 PM

## 2019-05-05 NOTE — Progress Notes (Signed)
STROKE TEAM PROGRESS NOTE   INTERVAL HISTORY Her daughters are at the bedside.  She continues to have dysarthria and unsteady gait.  I have personally reviewed history of presenting illness with the patient in details, electronic medical records and imaging films in PACS.  CT angiogram shows mild atherosclerotic changes without large vessel stenosis or occlusion.  Urine drug screen is positive for marijuana.  LDL cholesterol is elevated 125 mg percent.  Hemoglobin A1c is 5.5. Vitals:   05/05/19 0023 05/05/19 0415 05/05/19 0846 05/05/19 1033  BP: (!) 158/69 (!) 159/75 (!) 171/82 (!) 189/77  Pulse: (!) 56 (!) 58 67 62  Resp:   16   Temp:  98.2 F (36.8 C) 98.2 F (36.8 C)   TempSrc:  Oral Oral   SpO2: 97% 97% 96%     CBC:  Recent Labs  Lab 05/04/19 1536  WBC 10.0  NEUTROABS 8.0*  HGB 14.0  HCT 44.8  MCV 90.1  PLT 99991111    Basic Metabolic Panel:  Recent Labs  Lab 05/04/19 1536  NA 139  K 3.3*  CL 103  CO2 22  GLUCOSE 109*  BUN 16  CREATININE 0.81  CALCIUM 9.1   Lipid Panel:     Component Value Date/Time   CHOL 198 05/05/2019 0538   TRIG 113 05/05/2019 0538   HDL 50 05/05/2019 0538   CHOLHDL 4.0 05/05/2019 0538   VLDL 23 05/05/2019 0538   LDLCALC 125 (H) 05/05/2019 0538   LDLCALC 164 (H) 06/04/2017 1434   HgbA1c:  Lab Results  Component Value Date   HGBA1C 5.5 05/05/2019   Urine Drug Screen:     Component Value Date/Time   LABOPIA NONE DETECTED 05/04/2019 1730   COCAINSCRNUR NONE DETECTED 05/04/2019 1730   LABBENZ NONE DETECTED 05/04/2019 1730   AMPHETMU NONE DETECTED 05/04/2019 1730   THCU POSITIVE (A) 05/04/2019 1730   LABBARB NONE DETECTED 05/04/2019 1730    Alcohol Level     Component Value Date/Time   ETH <10 05/04/2019 1536    IMAGING past 24 hours CT ANGIO HEAD W OR WO CONTRAST  Result Date: 05/05/2019 CLINICAL DATA:  Stroke follow-up EXAM: CT ANGIOGRAPHY HEAD AND NECK TECHNIQUE: Multidetector CT imaging of the head and neck was performed  using the standard protocol during bolus administration of intravenous contrast. Multiplanar CT image reconstructions and MIPs were obtained to evaluate the vascular anatomy. Carotid stenosis measurements (when applicable) are obtained utilizing NASCET criteria, using the distal internal carotid diameter as the denominator. CONTRAST:  148mL OMNIPAQUE IOHEXOL 350 MG/ML SOLN COMPARISON:  Brain MRI from yesterday FINDINGS: CTA NECK FINDINGS Aortic arch: Atherosclerotic plaque. No acute finding or dilatation. Three vessel branching. Right carotid system: Mixed density plaque at the common carotid bifurcation with up to 40% stenosis. No ulceration. There is ICA tortuosity with mild kinking. Left carotid system: Mixed density plaque at the bifurcation with bulb stenosis measuring up to 30%. No beading or ulceration. Vertebral arteries: Proximal subclavian atheromatous plaque on the left. The left vertebral artery is strongly dominant. Both vertebral arteries are widely patent to the dura. Skeleton: Prominent cervical facet spurring which is generalized and prominent. No acute or aggressive finding. Other neck: Negative Upper chest: Biapical pleural based scarring. Review of the MIP images confirms the above findings CTA HEAD FINDINGS Anterior circulation: Scattered atherosclerotic plaque on the carotid siphons without focal or flow reducing stenosis. No major branch occlusion. No aneurysm or vascular malformation. Posterior circulation: Strong left vertebral artery dominance. Most of right vertebral  flow is to the PICA. Atheromatous irregularity of the bilateral posterior cerebral arteries which is mild to moderate. No branch occlusion or aneurysm. Venous sinuses: Unremarkable in the arterial phase. Anatomic variants: None significant Review of the MIP images confirms the above findings IMPRESSION: 1. No emergent finding. 2. Cervical and intracranial atherosclerosis without flow reducing stenosis or ulceration of major  vessels. Electronically Signed   By: Monte Fantasia M.D.   On: 05/05/2019 04:34   CT Head Wo Contrast  Result Date: 05/04/2019 CLINICAL DATA:  Neuro deficit, acute, stroke suspected. Additional history provided: Patient transported via EMS from home for slurred speech and left-sided facial droop, symptoms developed Tuesday night (unknown time). History of stroke. EXAM: CT HEAD WITHOUT CONTRAST TECHNIQUE: Contiguous axial images were obtained from the base of the skull through the vertex without intravenous contrast. COMPARISON:  No pertinent prior studies available for comparison. FINDINGS: Brain: Mildly motion degraded examination. Age-indeterminate infarct within the right corona radiata/basal ganglia. There is an additional age-indeterminate infarct more anteriorly within the right basal ganglia also involve the adjacent deep right frontal white matter. There is no evidence of acute intracranial hemorrhage. No midline shift or extra-axial fluid collection. Background mild ill-defined hypoattenuation within the cerebral white matter is nonspecific, but consistent with chronic small vessel ischemic disease. Mild generalized parenchymal atrophy. Vascular: No hyperdense vessel is identified. Atherosclerotic calcifications. Skull: Normal. Negative for fracture or focal lesion. Sinuses/Orbits: Visualized orbits demonstrate no acute abnormality. No significant paranasal sinus disease or mastoid effusion at the imaged levels. IMPRESSION: Mildly motion degraded examination. Age-indeterminate infarcts within the right frontal lobe white matter and right basal ganglia as described, possibly acute or subacute. Brain MRI is suggested for further evaluation. Background mild generalized parenchymal atrophy and chronic small vessel ischemic disease. Electronically Signed   By: Kellie Simmering DO   On: 05/04/2019 17:24   CT ANGIO NECK W OR WO CONTRAST  Result Date: 05/05/2019 CLINICAL DATA:  Stroke follow-up EXAM: CT  ANGIOGRAPHY HEAD AND NECK TECHNIQUE: Multidetector CT imaging of the head and neck was performed using the standard protocol during bolus administration of intravenous contrast. Multiplanar CT image reconstructions and MIPs were obtained to evaluate the vascular anatomy. Carotid stenosis measurements (when applicable) are obtained utilizing NASCET criteria, using the distal internal carotid diameter as the denominator. CONTRAST:  145mL OMNIPAQUE IOHEXOL 350 MG/ML SOLN COMPARISON:  Brain MRI from yesterday FINDINGS: CTA NECK FINDINGS Aortic arch: Atherosclerotic plaque. No acute finding or dilatation. Three vessel branching. Right carotid system: Mixed density plaque at the common carotid bifurcation with up to 40% stenosis. No ulceration. There is ICA tortuosity with mild kinking. Left carotid system: Mixed density plaque at the bifurcation with bulb stenosis measuring up to 30%. No beading or ulceration. Vertebral arteries: Proximal subclavian atheromatous plaque on the left. The left vertebral artery is strongly dominant. Both vertebral arteries are widely patent to the dura. Skeleton: Prominent cervical facet spurring which is generalized and prominent. No acute or aggressive finding. Other neck: Negative Upper chest: Biapical pleural based scarring. Review of the MIP images confirms the above findings CTA HEAD FINDINGS Anterior circulation: Scattered atherosclerotic plaque on the carotid siphons without focal or flow reducing stenosis. No major branch occlusion. No aneurysm or vascular malformation. Posterior circulation: Strong left vertebral artery dominance. Most of right vertebral flow is to the PICA. Atheromatous irregularity of the bilateral posterior cerebral arteries which is mild to moderate. No branch occlusion or aneurysm. Venous sinuses: Unremarkable in the arterial phase. Anatomic variants: None significant  Review of the MIP images confirms the above findings IMPRESSION: 1. No emergent finding. 2.  Cervical and intracranial atherosclerosis without flow reducing stenosis or ulceration of major vessels. Electronically Signed   By: Monte Fantasia M.D.   On: 05/05/2019 04:34   MR BRAIN WO CONTRAST  Result Date: 05/04/2019 CLINICAL DATA:  Left facial droop and dysarthria noted over the last 3 days. EXAM: MRI HEAD WITHOUT CONTRAST TECHNIQUE: Multiplanar, multiecho pulse sequences of the brain and surrounding structures were obtained without intravenous contrast. COMPARISON:  Head CT same day FINDINGS: Brain: Motion degraded study. No acute finding affects the brainstem or cerebellum. 8 mm acute infarction in the posterior limb internal capsule on the right. Punctate acute infarction in the left caudate head or white matter adjacent to the frontal horn of the lateral ventricle. 8 mm acute to subacute infarction in the white matter adjacent to the atrium of the right lateral ventricle. Other white matter disease seen at CT is not acute or subacute. Old right basal ganglia infarction. No hydrocephalus. No sign of gross hemorrhage. No extra-axial collection. IMPRESSION: Diffusion only study is degraded by motion. 8 mm acute infarction in the posterior limb internal capsule on the right. Punctate acute infarction either in the left caudate head or white matter adjacent to the frontal horn of the left lateral ventricle. 8 mm acute to subacute infarction in the white matter adjacent to the atrium of the right lateral ventricle. Old small vessel infarctions elsewhere affecting the hemispheric white matter and right basal ganglia. Electronically Signed   By: Nelson Chimes M.D.   On: 05/04/2019 20:58    PHYSICAL EXAM Pleasant middle-age frail Caucasian malnourished looking lady not in distress. . Afebrile. Head is nontraumatic. Neck is supple without bruit.    Cardiac exam no murmur or gallop. Lungs are clear to auscultation. Distal pulses are well felt. Neurological Exam ;  She is awake alert oriented to time place  and person.  Diminished attention, registration and recall.  Speech is dysarthric but can be understood.  She follows commands well.  Extraocular movements are full range without nystagmus.  She blinks to threat bilaterally.  Face is asymmetric with mild left lower face weakness..  Tongue midline.  Motor system exam shows symmetric upper and lower extremity strength with mild weakness in left grip intrinsic hand muscles and left hip flexors..  She has gait ataxia and requires two-person assist to walk. ASSESSMENT/PLAN [name] is a 64 y.o. female with history of HTN, HLD, tobacco use presenting with facial droop, dysarthria and unsteady gait.   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 Small vessel disease. Atrophy.   MRI  R PLIC infarct. Punctate L caudate head white matter infarct. R lateral ventricle infarct. Scattered old white matter and R basal ganglia infarcts.   CTA head & neck no ELVO. Head and neck atherosclerosis.  2D Echo EF 60-65%. No source of embolus   LDL 125  HgbA1c 5.5  Lovenox 40 mg sq daily for VTE prophylaxis  No antithrombotic prior to admission, now on aspirin 325 mg daily. Decrease aspirin to 81 and add plavix 75 mg daily. Continue DAPT x 3 weeks then aspirin alone. Orders adjusted.  Therapy recommendations:  CIR  Disposition:  pending   Hypertension  Elevated on arrival 201/98  Remains elevated at times to 190s . Permissive hypertension (OK  if < 220/120) but gradually normalize in 5-7 days . Long-term BP goal normotensive  Hyperlipidemia  Home meds:  No statin  Now on zocor 20  LDL 125, goal < 70  Change zocor to lipitor 40 mn   Continue statin at discharge  Other Stroke Risk Factors  Cigarette smoker, advised to stop smoking. Smokes 1.5 packs in 2 days. Add nicotine patch.     ETOH use, alcohol level  <10, advised to drink no more than 1 drink(s) a day  Substance abuse - UDS:  THC POSITIVE Patient advised to stop using due to stroke risk.  Hx stroke/TIA  Documented hx of stroke but no info available. Old small subcortical infarct seen on imaging.    Family hx stroke (mother)  Other Active Problems  Colitis, treated w/ naturopathic remedies  Thyroid disease  depression  Hospital day # 0 She presented with slurred speech and gait ataxia due to bilateral subcortical lacunar infarcts likely from small vessel disease.  Recommend aspirin and Plavix for 3 weeks followed by aspirin alone.  Patient counseled to quit smoking and marijuana and is agreeable.  Recommend nicotine patch.  Check echocardiogram results.  Physical occupational and speech therapy consults.  Aggressive risk factor modification.  Greater than 50% time during this 35-minute visit was spent on counseling and coordination of care about her lacunar strokes and answering questions.  Discussed with Dr. Gevena Barre.  Discussed with patient's daughter at the bedside and answered questions. Stroke team will sign off kindly call for questions.  Follow-up with outpatient stroke clinic with Janett Billow my nurse practitioner in 6 weeks.  Antony Contras, MD  To contact Stroke Continuity provider, please refer to [email or web address] After hours, contact General Neurology

## 2019-05-05 NOTE — Consult Note (Signed)
Neurology Consultation Reason for Consult: Stroke Referring Physician: Tegeler, C  CC: Stroke  History is obtained from: Patient  HPI: Stacie [name] is a 64 y.o. female with a history of hypertension, hyperlipidemia who has had 4 days of facial droop and dysarthria.  She has been weak and having difficulty with walking.  She waited to see if it would get better and wanted and she finally sought medical attention in the emergency department today.  In the ER, she had an MRI performed which shows multiple small subcortical strokes.   LKW: 4 days ago tpa given?: no, outside of window    ROS: A 14 point ROS was performed and is negative except as noted in the HPI.   Past Medical History:  Diagnosis Date  . Colitis   . Hyperlipidemia   . Hypertension   . Stroke ([place]) 04/2012   unknown type  . Thyroid disease      Family History  Problem Relation Age of Onset  . Stroke Mother 42       embolic  . Heart disease Father 25       CAd with CABG, no MI  . Heart disease Sister 35       CAD  . Heart disease Paternal Uncle   . Heart disease Maternal Grandmother   . Heart disease Paternal Grandfather   . Colon cancer Neg Hx   . Esophageal cancer Neg Hx   . Stomach cancer Neg Hx      Social History:  reports that she has been smoking. She has a 2.50 pack-year smoking history. She has never used smokeless tobacco. She reports current alcohol use of about 14.0 standard drinks of alcohol per week. She reports current drug use. Frequency: 7.00 times per week. Drug: Marijuana.   Exam: Current vital signs: BP (!) 158/69 (BP Location: Right Arm)   Pulse (!) 56   Temp 98.3 F (36.8 C) (Oral)   Resp 18   SpO2 97%  Vital signs in last 24 hours: Temp:  [97.7 F (36.5 C)-98.3 F (36.8 C)] 98.3 F (36.8 C) (04/01 2331) Pulse Rate:  [52-76] 56 (04/02 0023) Resp:  [14-24] 18 (04/01 2331) BP: (142-201)/(57-98) 158/69 (04/02 0023) SpO2:  [92 %-100 %] 97 % (04/02 0023)   Physical  Exam  Constitutional: Appears well-developed and well-nourished.  Psych: Affect appropriate to situation Eyes: No scleral injection HENT: No OP obstrucion MSK: no joint deformities.  Cardiovascular: Normal rate and regular rhythm.  Respiratory: Effort normal, non-labored breathing GI: Soft.  No distension. There is no tenderness.  Skin: WDI  Neuro: Mental Status: Patient is awake, alert, oriented to person, place, month, year, and situation. Patient is able to give a clear and coherent history. No signs of aphasia or neglect  Cranial Nerves: II: Visual Fields are full. Pupils are equal, round, and reactive to light.   III,IV, VI: EOMI without ptosis or diploplia.  V: Facial sensation is symmetric to temperature VII: Facial movement is weak on the left VIII: hearing is intact to voice X: Uvula elevates symmetrically XI: Shoulder shrug is symmetric. XII: tongue is midline without atrophy or fasciculations.  Motor: She has 4/5 left arm and leg weakness.?  Some right leg weakness as well.  Good strength in the right arm Sensory: Sensation is symmetric to light touch in the arms and legs. Cerebellar: FNF intact on the right, consistent with weakness on the left.  I have reviewed labs in epic and the results pertinent to this consultation  are: CMP-unremarkable  I have reviewed the images obtained: MRI brain-3 subcortical white matter infarcts, suspect concurrent small vessel disease  Impression: 64 year old female with multiple subcortical infarcts, suspect concurrent small vessel disease, but she needs an embolic work-up as well.  She is being admitted for secondary risk factor modification and therapy evaluations.  Recommendations: - HgbA1c, fasting lipid panel - Frequent neuro checks - Echocardiogram - Carotid dopplers - Prophylactic therapy-Antiplatelet med: Aspirin - dose 325mg  PO or 300mg  PR - Risk factor modification - Telemetry monitoring - PT consult, OT consult,  Speech consult - Stroke team to follow   Roland Rack, MD [facility] [phone]  If 7pm- 7am, please page neurology on call as listed in [place].

## 2019-05-05 NOTE — Progress Notes (Signed)
Rehab Admissions Coordinator Note:  Per PT/OT recommendation, patient was screened by Michel Santee for appropriateness for an Inpatient Acute Rehab Consult.  At this time, we are recommending Inpatient Rehab consult.  I will place an order per our protocol.   Michel Santee 05/05/2019, 3:21 PM  I can be reached at [phone].

## 2019-05-05 NOTE — Evaluation (Signed)
Occupational Therapy Evaluation Patient Details Name: Stacie [name] MRN: [id number] DOB: 09/20/1955 Today's Date: 05/05/2019    History of Present Illness 64 yo female admitted with reports of BIL LE weakness, BP 201/90, hypokalemia CT parenchymal atrophy and chronice small vess ischemica disease. 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 Pt currently with functional limitiations due to the deficits listed below (see OT problem list). Pt with slurred speech, cognitive and balance deficits noted. Pt could progress in 10-14 level to home d/c with ex spouse (A) supervision level.  Pt will benefit from skilled OT to increase their independence and safety with adls and balance to allow discharge CIR.     Follow Up Recommendations  CIR    Equipment Recommendations  3 in 1 bedside commode    Recommendations for Other Services Rehab consult     Precautions / Restrictions Precautions Precautions: Fall Restrictions Weight Bearing Restrictions: No      Mobility Bed Mobility Overal bed mobility: Needs Assistance Bed Mobility: Supine to Sit;Rolling;Sit to Supine Rolling: Min assist   Supine to sit: Mod assist Sit to supine: Min assist   General bed mobility comments: pt with immediate urgency to void bladder and becoming increasingly restless and agitated. pt pulling at bed to attempt to exit the bed.pt requires (A) for trunk elevation and pulling at bed rails pt voiding on toilet with BSC. pt requires (A) to scoot up in the bed upon return. pt fixated on return to bed and declines  reclines  Transfers Overall transfer level: Needs assistance Equipment used: 2 person hand held assist Transfers: Sit to/from Stand Sit to Stand: +2 physical assistance;Min assist         General transfer comment: pt is able to power up but demonstrates L side weakness with heavy lean L. pt using R LE to balance    Balance Overall balance assessment: Needs assistance Sitting-balance support: Bilateral upper extremity supported;Feet supported Sitting balance-Leahy Scale: Fair       Standing balance-Leahy Scale: Poor                             ADL either performed or assessed with clinical judgement   ADL Overall ADL's : Needs assistance/impaired Eating/Feeding: Moderate assistance;Bed level Eating/Feeding Details (indicate cue type and reason): needed encouragement to visually attend to cup to scoop out ice chips. pt motivated to eat ice so engaged in task.  Grooming: Wash/dry face;Minimal assistance;Bed level Grooming Details (indicate cue type and reason): pt pulling cool wash cloth off face and putting it back in a restless manner               Lower Body Dressing Details (indicate cue type and reason): pt declines to don due to urgency and states "no you do it" Toilet Transfer: +2 for physical assistance;Moderate assistance;Ambulation;BSC   Toileting- Clothing Manipulation and Hygiene: Moderate assistance;Sitting/lateral lean       Functional mobility during ADLs: +2 for physical assistance;Moderate assistance General ADL Comments: pt very restless and overall impulsive at this time. pt  needs cues to remain seated and not to pull at leads/ therapists     Vision Baseline Vision/History: Wears glasses Wears Glasses: Reading only Vision Assessment?: Vision impaired- to be further tested in functional context Additional Comments: needs further assessment. pt with eyes R deviated but can track past midline. pt does not follow visual testing ,  dysconjugate gaze noted a few times but difficult to test and visualize as pt keeping eye closed     Perception Perception Perception Tested?: Yes Perception Deficits: Inattention/neglect Inattention/Neglect: Does not attend to left visual field;Does not attend to left side of body;Impaired- to be further tested in functional context   Praxis      Pertinent Vitals/Pain Pain Assessment: No/denies pain(nausea reported)     Hand Dominance Right   Extremity/Trunk Assessment Upper Extremity Assessment Upper Extremity Assessment: LUE deficits/detail LUE Deficits / Details: holding UE internally rotated and decrease attention to L UE. pt with decrease grasp and fine motor. pt able to place hand around cup but unable to sustain,. AROM shoulder flexion 80 degrees unable to sustain LUE Sensation: decreased light touch LUE Coordination: decreased fine motor;decreased gross motor   Lower Extremity Assessment Lower Extremity Assessment: Defer to PT evaluation   Cervical / Trunk Assessment Cervical / Trunk Assessment: Normal   Communication Communication Communication: Expressive difficulties   Cognition Arousal/Alertness: Awake/alert Behavior During Therapy: Impulsive;Anxious;Restless;Agitated Overall Cognitive Status: Impaired/Different from baseline Area of Impairment: Awareness;Safety/judgement;Following commands;Problem solving                       Following Commands: Follows multi-step commands inconsistently;Follows one step commands with increased time Safety/Judgement: Decreased awareness of deficits;Decreased awareness of safety Awareness: Emergent Problem Solving: Slow processing;Difficulty sequencing General Comments: pt needs cues for sequence at times. pt asking for ice chips and stating she needs to throw up at the same time. Pt oriented to location / person/ place and reason for admission. pt with lack of awareness to deficits   General Comments  daughters  report they and their father can provided (A) upon d/c. they strongly want CIR recommendation. daughter states "she needs to have to work to get out of here so that she gets better"    Exercises     Shoulder Instructions      Home Living Family/patient expects to be discharged to:: Private residence Living Arrangements: Other (Comment)(ex husband) Available Help at Discharge: Family;Available 24 hours/day Type of Home: House Home Access: Stairs to enter [employer] of Steps: 3   Home Layout: Able to live on main level with bedroom/bathroom     Bathroom Shower/Tub: Teacher, early years/pre: Standard     Home Equipment: None   Additional Comments: x2 daughters present reports ex spouse has 3 cats in the house but she does not care for animals       Prior Functioning/Environment Level of Independence: Independent                 OT Problem List: Decreased strength;Decreased activity tolerance;Impaired balance (sitting and/or standing);Decreased safety awareness;Decreased knowledge of use of DME or AE;Decreased knowledge of precautions;Impaired UE functional use      OT Treatment/Interventions: Self-care/ADL training;Therapeutic exercise;Neuromuscular education;Energy conservation;DME and/or AE instruction;Manual therapy;Modalities;Therapeutic activities;Cognitive remediation/compensation;Visual/perceptual remediation/compensation;Patient/family education;Balance training    OT Goals(Current goals can be found in the care plan section) Acute Rehab OT Goals Patient Stated Goal: to get ice OT Goal Formulation: With patient/family Potential to Achieve Goals: Good  OT Frequency: Min 3X/week  Barriers to D/C:            Co-evaluation PT/OT/SLP Co-Evaluation/Treatment: Yes Reason for Co-Treatment: For patient/therapist safety;To address functional/ADL transfers;Necessary to address cognition/behavior during functional activity   OT goals addressed  during session: ADL's and self-care;Strengthening/ROM      AM-PAC OT "6 Clicks" Daily Activity     Outcome Measure Help from another person eating meals?: A Lot Help from another person taking care of personal grooming?: A Lot Help from another person toileting, which includes using toliet, bedpan, or urinal?: A Lot Help from another person bathing (including washing, rinsing, drying)?: A Lot Help from another person to put on and taking off regular upper body clothing?: A Lot Help from another person to put on and taking off regular lower body clothing?: A Lot 6 Click Score: 12   End of Session Nurse Communication: Mobility status;Precautions  Activity Tolerance: Patient tolerated treatment well Patient left: in bed;with call bell/phone within reach;with bed alarm set;with family/visitor present  OT Visit Diagnosis: Unsteadiness on feet (R26.81);Muscle weakness (generalized) (M62.81)                Time: [id number] OT Time Calculation (min): 26 min Charges:  OT General Charges $OT Visit: 1 Visit OT Evaluation $OT Eval Moderate Complexity: 1 Mod   Brynn, OTR/L  Acute Rehabilitation Services Pager: [phone] Office: [phone] .   Jeri Modena 05/05/2019, 11:21 AM

## 2019-05-05 NOTE — Progress Notes (Signed)
  Echocardiogram 2D Echocardiogram has been performed.  Stacie [name] A Derian Dimalanta 05/05/2019, 1:50 PM

## 2019-05-05 NOTE — Consult Note (Signed)
Physical Medicine and Rehabilitation Consult Reason for Consult: Bilateral lower extremity weakness Referring Physician: [facility]   HPI: Stacie [name] is a 64 y.o. right-handed female with history of hypertension, hyperlipidemia tobacco marijuana and tobacco use.  Per chart review reportedly lives with her ex-husband.  Independent prior to admission.  Two-level home bed and bath on main level 3 steps to entry.  Presented 05/04/2019 with bilateral lower extremity weakness and difficulty with ambulation with facial droop and dysarthria..  Cranial CT scan age-indeterminate infarcts within the right frontal lobe white matter and right basal ganglia possibly acute or subacute.  MRI 8 mm acute infarction posterior limb internal capsule on the right.  Punctate acute infarct either in the left caudate head or white matter adjacent to the frontal horn of the left lateral ventricle.  8 mm acute to subacute infarction in the white matter adjacent to the atrium of the right lateral ventricle.  Patient did not receive TPA.  CT angiogram head and neck no emergent findings.  Echocardiogram with ejection fraction 65% without emboli.  Admission chemistries unremarkable except potassium 3.3, alcohol negative, urine drug screen positive marijuana.  Placed on aspirin for CVA prophylaxis.  Subcutaneous Lovenox for DVT prophylaxis.  Maintain on mechanical soft diet.  Therapy evaluations completed with recommendations of physical medicine rehab consult.   Review of Systems  Constitutional: Negative for chills and fever.  HENT: Negative for hearing loss.   Eyes: Negative for double vision.  Respiratory: Negative for shortness of breath.   Cardiovascular: Negative for chest pain, palpitations and leg swelling.  Gastrointestinal: Positive for constipation. Negative for heartburn, nausea and vomiting.  Genitourinary: Negative for dysuria, flank pain and hematuria.  Musculoskeletal: Positive for myalgias.  Skin:  Negative for rash.  All other systems reviewed and are negative.  Past Medical History:  Diagnosis Date   Colitis    Hyperlipidemia    Hypertension    Stroke [facility]) 04/2012   unknown type   Thyroid disease    History reviewed. No pertinent surgical history. Family History  Problem Relation Age of Onset   Stroke Mother 79       embolic   Heart disease Father 45       CAd with CABG, no MI   Heart disease Sister 51       CAD   Heart disease Paternal Uncle    Heart disease Maternal Grandmother    Heart disease Paternal Grandfather    Colon cancer Neg Hx    Esophageal cancer Neg Hx    Stomach cancer Neg Hx    Social History:  reports that she has been smoking. She has a 2.50 pack-year smoking history. She has never used smokeless tobacco. She reports current alcohol use of about 14.0 standard drinks of alcohol per week. She reports current drug use. Frequency: 7.00 times per week. Drug: Marijuana. Allergies:  Allergies  Allergen Reactions   Trazodone And Nefazodone Nausea And Vomiting   Wellbutrin [Bupropion] Nausea And Vomiting   Medications Prior to Admission  Medication Sig Dispense Refill   amLODipine (NORVASC) 10 MG tablet Take 1 tablet (10 mg total) by mouth daily. 90 tablet 1   doxylamine, Sleep, (UNISOM) 25 MG tablet Take 25 mg by mouth at bedtime as needed for sleep.      escitalopram (LEXAPRO) 20 MG tablet TAKE 1 TABLET(20 MG) BY MOUTH DAILY (Patient taking differently: Take 20 mg by mouth daily. ) 90 tablet 3   diphenoxylate-atropine (LOMOTIL) 2.5-0.025 MG tablet  Take 1 tablet by mouth 4 (four) times daily as needed for diarrhea or loose stools. (Patient not taking: Reported on 10/06/2018) 100 tablet 1    Home: Home Living Family/patient expects to be discharged to:: Private residence Living Arrangements: Other (Comment)(ex husband) Available Help at Discharge: Family, Available 24 hours/day Type of Home: House Home Access: Stairs to  enter [employer] of Steps: 3 Home Layout: Able to live on main level with bedroom/bathroom Bathroom Shower/Tub: Chiropodist: Standard Home Equipment: None Additional Comments: x2 daughters present reports ex spouse has 3 cats in the house but she does not care for animals   Functional History: Prior Function Level of Independence: Independent Functional Status:  Mobility: Bed Mobility Overal bed mobility: Needs Assistance Bed Mobility: Supine to Sit, Rolling, Sit to Supine Rolling: Min assist Supine to sit: Mod assist Sit to supine: Min assist General bed mobility comments: pt with immediate urgency to void bladder and becoming increasingly restless and agitated. pt pulling at bed to attempt to exit the bed.pt requires (A) for trunk elevation and pulling at bed rails pt voiding on toilet with BSC. pt requires (A) to scoot up in the bed upon return. pt fixated on return to bed Transfers Overall transfer level: Needs assistance Equipment used: 2 person hand held assist Transfers: Sit to/from Stand Sit to Stand: +2 physical assistance, Min assist General transfer comment: pt is able to power up but demonstrates L side weakness with heavy lean L. pt using R LE to balance Ambulation/Gait Ambulation/Gait assistance: Mod assist, +2 physical assistance, +2 safety/equipment Gait Distance (Feet): 15 Feet Assistive device: 2 person hand held assist Gait Pattern/deviations: Step-through pattern, Decreased stride length, Decreased stance time - left General Gait Details: Heavily reliant on external support, demonstrating left lateral lean, handheld assist for guidance and environmental negotiation. ModA + 2 for balance    ADL: ADL Overall ADL's : Needs assistance/impaired Eating/Feeding: Moderate assistance, Bed level Eating/Feeding Details (indicate cue type and reason): needed encouragement to visually attend to cup to scoop out ice chips. pt motivated to eat  ice so engaged in task.  Grooming: Wash/dry face, Minimal assistance, Bed level Grooming Details (indicate cue type and reason): pt pulling cool wash cloth off face and putting it back in a restless manner Lower Body Dressing Details (indicate cue type and reason): pt declines to don due to urgency and states "no you do it" Toilet Transfer: +2 for physical assistance, Moderate assistance, Ambulation, BSC Toileting- Clothing Manipulation and Hygiene: Moderate assistance, Sitting/lateral lean Functional mobility during ADLs: +2 for physical assistance, Moderate assistance General ADL Comments: pt very restless and overall impulsive at this time. pt needs cues to remain seated and not to pull at leads/ therapists  Cognition: Cognition Overall Cognitive Status: Impaired/Different from baseline Orientation Level: Oriented X4 Cognition Arousal/Alertness: Awake/alert Behavior During Therapy: Impulsive, Anxious, Restless, Agitated Overall Cognitive Status: Impaired/Different from baseline Area of Impairment: Awareness, Safety/judgement, Following commands, Problem solving Following Commands: Follows multi-step commands inconsistently, Follows one step commands with increased time Safety/Judgement: Decreased awareness of deficits, Decreased awareness of safety Awareness: Emergent Problem Solving: Slow processing, Difficulty sequencing General Comments: pt needs cues for sequence at times. pt asking for ice chips and stating she needs to throw up at the same time. Pt oriented to location / person/ place and reason for admission. pt with lack of awareness to deficits  Blood pressure (!) 156/74, pulse (!) 59, temperature 98.3 F (36.8 C), temperature source Oral, resp. rate 16, SpO2 97 %.  General: Alert and oriented x 3, No apparent distress. Feeling very cold.  HEENT: Head is normocephalic, atraumatic, diplopia Neck: Supple without JVD or lymphadenopathy Heart: Reg rate and rhythm. No murmurs rubs  or gallops Chest: CTA bilaterally without wheezes, rales, or rhonchi; no distress Abdomen: Soft, non-tender, non-distended, bowel sounds positive. Extremities: No clubbing, cyanosis, or edema. Pulses are 2+ Skin: Clean and intact without signs of breakdown Neurological: Patient is alert in no acute distress.  Speech is a bit dysarthric.  Follows commands.  Oriented x3.  Fair awareness of deficits.  Musculoskeletal: 4+/5 strength throughout limited by weakness and fatigue, with the exceptio  Of distal left upper extremity which is 4-/5.  Psych: Pt's affect is appropriate. Pt is cooperative  Results for orders placed or performed during the hospital encounter of 05/04/19 (from the past 24 hour(s))  CBC with Differential     Status: Abnormal   Collection Time: 05/04/19  3:36 PM  Result Value Ref Range   WBC 10.0 4.0 - 10.5 K/uL   RBC 4.97 3.87 - 5.11 MIL/uL   Hemoglobin 14.0 12.0 - 15.0 g/dL   HCT 44.8 36.0 - 46.0 %   MCV 90.1 80.0 - 100.0 fL   MCH 28.2 26.0 - 34.0 pg   MCHC 31.3 30.0 - 36.0 g/dL   RDW 13.5 11.5 - 15.5 %   Platelets 310 150 - 400 K/uL   nRBC 0.0 0.0 - 0.2 %   Neutrophils Relative % 80 %   Neutro Abs 8.0 (H) 1.7 - 7.7 K/uL   Lymphocytes Relative 13 %   Lymphs Abs 1.3 0.7 - 4.0 K/uL   Monocytes Relative 7 %   Monocytes Absolute 0.7 0.1 - 1.0 K/uL   Eosinophils Relative 0 %   Eosinophils Absolute 0.0 0.0 - 0.5 K/uL   Basophils Relative 0 %   Basophils Absolute 0.0 0.0 - 0.1 K/uL   Immature Granulocytes 0 %   Abs Immature Granulocytes 0.03 0.00 - 0.07 K/uL  Comprehensive metabolic panel     Status: Abnormal   Collection Time: 05/04/19  3:36 PM  Result Value Ref Range   Sodium 139 135 - 145 mmol/L   Potassium 3.3 (L) 3.5 - 5.1 mmol/L   Chloride 103 98 - 111 mmol/L   CO2 22 22 - 32 mmol/L   Glucose, Bld 109 (H) 70 - 99 mg/dL   BUN 16 8 - 23 mg/dL   Creatinine, Ser 0.81 0.44 - 1.00 mg/dL   Calcium 9.1 8.9 - 10.3 mg/dL   Total Protein 6.9 6.5 - 8.1 g/dL   Albumin  4.1 3.5 - 5.0 g/dL   AST 25 15 - 41 U/L   ALT 16 0 - 44 U/L   Alkaline Phosphatase 81 38 - 126 U/L   Total Bilirubin 1.0 0.3 - 1.2 mg/dL   GFR calc non Af Amer >60 >60 mL/min   GFR calc Af Amer >60 >60 mL/min   Anion gap 14 5 - 15  TSH     Status: None   Collection Time: 05/04/19  3:36 PM  Result Value Ref Range   TSH 1.609 0.350 - 4.500 uIU/mL  Ethanol     Status: None   Collection Time: 05/04/19  3:36 PM  Result Value Ref Range   Alcohol, Ethyl (B) <10 <10 mg/dL  Lipase, blood     Status: None   Collection Time: 05/04/19  3:36 PM  Result Value Ref Range   Lipase 26 11 - 51 U/L  Lactic  acid, plasma     Status: None   Collection Time: 05/04/19  4:00 PM  Result Value Ref Range   Lactic Acid, Venous 0.9 0.5 - 1.9 mmol/L  Urine culture     Status: Abnormal (Preliminary result)   Collection Time: 05/04/19  5:22 PM   Specimen: Urine, Random  Result Value Ref Range   Specimen Description URINE, RANDOM    Special Requests NONE    Culture (A)     70,000 COLONIES/mL ESCHERICHIA COLI SUSCEPTIBILITIES TO FOLLOW Performed at [facility], [place] [address], [place], [place] [address]    Report Status PENDING   Rapid urine drug screen (hospital performed)     Status: Abnormal   Collection Time: 05/04/19  5:30 PM  Result Value Ref Range   Opiates NONE DETECTED NONE DETECTED   Cocaine NONE DETECTED NONE DETECTED   Benzodiazepines NONE DETECTED NONE DETECTED   Amphetamines NONE DETECTED NONE DETECTED   Tetrahydrocannabinol POSITIVE (A) NONE DETECTED   Barbiturates NONE DETECTED NONE DETECTED  Urinalysis, Routine w reflex microscopic     Status: Abnormal   Collection Time: 05/04/19  5:30 PM  Result Value Ref Range   Color, Urine YELLOW YELLOW   APPearance HAZY (A) CLEAR   Specific Gravity, Urine 1.024 1.005 - 1.030   pH 5.0 5.0 - 8.0   Glucose, UA NEGATIVE NEGATIVE mg/dL   Hgb urine dipstick NEGATIVE NEGATIVE   Bilirubin Urine NEGATIVE NEGATIVE   Ketones, ur 80 (A) NEGATIVE  mg/dL   Protein, ur 100 (A) NEGATIVE mg/dL   Nitrite NEGATIVE NEGATIVE   Leukocytes,Ua NEGATIVE NEGATIVE   RBC / HPF 0-5 0 - 5 RBC/hpf   WBC, UA 0-5 0 - 5 WBC/hpf   Bacteria, UA RARE (A) NONE SEEN   Squamous Epithelial / LPF 0-5 0 - 5   Mucus PRESENT    Hyaline Casts, UA PRESENT   SARS CORONAVIRUS 2 (TAT 6-24 HRS) Nasopharyngeal Nasopharyngeal Swab     Status: None   Collection Time: 05/04/19 10:46 PM   Specimen: Nasopharyngeal Swab  Result Value Ref Range   SARS Coronavirus 2 NEGATIVE NEGATIVE  Hemoglobin A1c     Status: None   Collection Time: 05/05/19  5:38 AM  Result Value Ref Range   Hgb A1c MFr Bld 5.5 4.8 - 5.6 %   Mean Plasma Glucose 111.15 mg/dL  Lipid panel     Status: Abnormal   Collection Time: 05/05/19  5:38 AM  Result Value Ref Range   Cholesterol 198 0 - 200 mg/dL   Triglycerides 113 <150 mg/dL   HDL 50 >40 mg/dL   Total CHOL/HDL Ratio 4.0 RATIO   VLDL 23 0 - 40 mg/dL   LDL Cholesterol 125 (H) 0 - 99 mg/dL  HIV Antibody (routine testing w rflx)     Status: None   Collection Time: 05/05/19  5:38 AM  Result Value Ref Range   HIV Screen 4th Generation wRfx NON REACTIVE NON REACTIVE   CT ANGIO HEAD W OR WO CONTRAST  Result Date: 05/05/2019 CLINICAL DATA:  Stroke follow-up EXAM: CT ANGIOGRAPHY HEAD AND NECK TECHNIQUE: Multidetector CT imaging of the head and neck was performed using the standard protocol during bolus administration of intravenous contrast. Multiplanar CT image reconstructions and MIPs were obtained to evaluate the vascular anatomy. Carotid stenosis measurements (when applicable) are obtained utilizing NASCET criteria, using the distal internal carotid diameter as the denominator. CONTRAST:  149mL OMNIPAQUE IOHEXOL 350 MG/ML SOLN COMPARISON:  Brain MRI from yesterday  FINDINGS: CTA NECK FINDINGS Aortic arch: Atherosclerotic plaque. No acute finding or dilatation. Three vessel branching. Right carotid system: Mixed density plaque at the common carotid  bifurcation with up to 40% stenosis. No ulceration. There is ICA tortuosity with mild kinking. Left carotid system: Mixed density plaque at the bifurcation with bulb stenosis measuring up to 30%. No beading or ulceration. Vertebral arteries: Proximal subclavian atheromatous plaque on the left. The left vertebral artery is strongly dominant. Both vertebral arteries are widely patent to the dura. Skeleton: Prominent cervical facet spurring which is generalized and prominent. No acute or aggressive finding. Other neck: Negative Upper chest: Biapical pleural based scarring. Review of the MIP images confirms the above findings CTA HEAD FINDINGS Anterior circulation: Scattered atherosclerotic plaque on the carotid siphons without focal or flow reducing stenosis. No major branch occlusion. No aneurysm or vascular malformation. Posterior circulation: Strong left vertebral artery dominance. Most of right vertebral flow is to the PICA. Atheromatous irregularity of the bilateral posterior cerebral arteries which is mild to moderate. No branch occlusion or aneurysm. Venous sinuses: Unremarkable in the arterial phase. Anatomic variants: None significant Review of the MIP images confirms the above findings IMPRESSION: 1. No emergent finding. 2. Cervical and intracranial atherosclerosis without flow reducing stenosis or ulceration of major vessels. Electronically Signed   By: Monte Fantasia M.D.   On: 05/05/2019 04:34   CT Head Wo Contrast  Result Date: 05/04/2019 CLINICAL DATA:  Neuro deficit, acute, stroke suspected. Additional history provided: Patient transported via EMS from home for slurred speech and left-sided facial droop, symptoms developed Tuesday night (unknown time). History of stroke. EXAM: CT HEAD WITHOUT CONTRAST TECHNIQUE: Contiguous axial images were obtained from the base of the skull through the vertex without intravenous contrast. COMPARISON:  No pertinent prior studies available for comparison. FINDINGS:  Brain: Mildly motion degraded examination. Age-indeterminate infarct within the right corona radiata/basal ganglia. There is an additional age-indeterminate infarct more anteriorly within the right basal ganglia also involve the adjacent deep right frontal white matter. There is no evidence of acute intracranial hemorrhage. No midline shift or extra-axial fluid collection. Background mild ill-defined hypoattenuation within the cerebral white matter is nonspecific, but consistent with chronic small vessel ischemic disease. Mild generalized parenchymal atrophy. Vascular: No hyperdense vessel is identified. Atherosclerotic calcifications. Skull: Normal. Negative for fracture or focal lesion. Sinuses/Orbits: Visualized orbits demonstrate no acute abnormality. No significant paranasal sinus disease or mastoid effusion at the imaged levels. IMPRESSION: Mildly motion degraded examination. Age-indeterminate infarcts within the right frontal lobe white matter and right basal ganglia as described, possibly acute or subacute. Brain MRI is suggested for further evaluation. Background mild generalized parenchymal atrophy and chronic small vessel ischemic disease. Electronically Signed   By: Kellie Simmering DO   On: 05/04/2019 17:24   CT ANGIO NECK W OR WO CONTRAST  Result Date: 05/05/2019 CLINICAL DATA:  Stroke follow-up EXAM: CT ANGIOGRAPHY HEAD AND NECK TECHNIQUE: Multidetector CT imaging of the head and neck was performed using the standard protocol during bolus administration of intravenous contrast. Multiplanar CT image reconstructions and MIPs were obtained to evaluate the vascular anatomy. Carotid stenosis measurements (when applicable) are obtained utilizing NASCET criteria, using the distal internal carotid diameter as the denominator. CONTRAST:  168mL OMNIPAQUE IOHEXOL 350 MG/ML SOLN COMPARISON:  Brain MRI from yesterday FINDINGS: CTA NECK FINDINGS Aortic arch: Atherosclerotic plaque. No acute finding or dilatation.  Three vessel branching. Right carotid system: Mixed density plaque at the common carotid bifurcation with up to 40% stenosis. No  ulceration. There is ICA tortuosity with mild kinking. Left carotid system: Mixed density plaque at the bifurcation with bulb stenosis measuring up to 30%. No beading or ulceration. Vertebral arteries: Proximal subclavian atheromatous plaque on the left. The left vertebral artery is strongly dominant. Both vertebral arteries are widely patent to the dura. Skeleton: Prominent cervical facet spurring which is generalized and prominent. No acute or aggressive finding. Other neck: Negative Upper chest: Biapical pleural based scarring. Review of the MIP images confirms the above findings CTA HEAD FINDINGS Anterior circulation: Scattered atherosclerotic plaque on the carotid siphons without focal or flow reducing stenosis. No major branch occlusion. No aneurysm or vascular malformation. Posterior circulation: Strong left vertebral artery dominance. Most of right vertebral flow is to the PICA. Atheromatous irregularity of the bilateral posterior cerebral arteries which is mild to moderate. No branch occlusion or aneurysm. Venous sinuses: Unremarkable in the arterial phase. Anatomic variants: None significant Review of the MIP images confirms the above findings IMPRESSION: 1. No emergent finding. 2. Cervical and intracranial atherosclerosis without flow reducing stenosis or ulceration of major vessels. Electronically Signed   By: Monte Fantasia M.D.   On: 05/05/2019 04:34   MR BRAIN WO CONTRAST  Result Date: 05/04/2019 CLINICAL DATA:  Left facial droop and dysarthria noted over the last 3 days. EXAM: MRI HEAD WITHOUT CONTRAST TECHNIQUE: Multiplanar, multiecho pulse sequences of the brain and surrounding structures were obtained without intravenous contrast. COMPARISON:  Head CT same day FINDINGS: Brain: Motion degraded study. No acute finding affects the brainstem or cerebellum. 8 mm acute  infarction in the posterior limb internal capsule on the right. Punctate acute infarction in the left caudate head or white matter adjacent to the frontal horn of the lateral ventricle. 8 mm acute to subacute infarction in the white matter adjacent to the atrium of the right lateral ventricle. Other white matter disease seen at CT is not acute or subacute. Old right basal ganglia infarction. No hydrocephalus. No sign of gross hemorrhage. No extra-axial collection. IMPRESSION: Diffusion only study is degraded by motion. 8 mm acute infarction in the posterior limb internal capsule on the right. Punctate acute infarction either in the left caudate head or white matter adjacent to the frontal horn of the left lateral ventricle. 8 mm acute to subacute infarction in the white matter adjacent to the atrium of the right lateral ventricle. Old small vessel infarctions elsewhere affecting the hemispheric white matter and right basal ganglia. Electronically Signed   By: Nelson Chimes M.D.   On: 05/04/2019 20:58   ECHOCARDIOGRAM COMPLETE  Result Date: 05/05/2019    ECHOCARDIOGRAM REPORT   Patient Name:   Stacie [name] Date of Exam: 05/05/2019 Medical Rec #:  [id number]       Height:       64.6 in Accession #:    [id number]      Weight:       89.1 lb Date of Birth:  18-May-1955        BSA:          1.396 m Patient Age:    87 years        BP:           156/74 mmHg Patient Gender: F               HR:           59 bpm. Exam Location:  Inpatient Procedure: 2D Echo Indications:    TIA 435.9 / G45.9  History:  Patient has no prior history of Echocardiogram examinations.                 Stroke; Risk Factors:Dyslipidemia and Hypertension.  Sonographer:    Vikki Ports Turrentine Referring Phys: [id number] JAN A Jennings  1. Normal LV systolic function; grade 1 diastolic dysfunction.  2. Left ventricular ejection fraction, by estimation, is 60 to 65%. The left ventricle has normal function. The left ventricle has no regional wall  motion abnormalities. Left ventricular diastolic parameters are consistent with Grade I diastolic dysfunction (impaired relaxation).  3. Right ventricular systolic function is normal. The right ventricular size is normal.  4. The mitral valve is normal in structure. Trivial mitral valve regurgitation. No evidence of mitral stenosis.  5. The aortic valve is tricuspid. Aortic valve regurgitation is not visualized. No aortic stenosis is present.  6. The inferior vena cava is normal in size with greater than 50% respiratory variability, suggesting right atrial pressure of 3 mmHg. FINDINGS  Left Ventricle: Left ventricular ejection fraction, by estimation, is 60 to 65%. The left ventricle has normal function. The left ventricle has no regional wall motion abnormalities. The left ventricular internal cavity size was normal in size. There is  no left ventricular hypertrophy. Left ventricular diastolic parameters are consistent with Grade I diastolic dysfunction (impaired relaxation). Right Ventricle: The right ventricular size is normal. Right ventricular systolic function is normal. Left Atrium: Left atrial size was normal in size. Right Atrium: Right atrial size was normal in size. Pericardium: There is no evidence of pericardial effusion. Mitral Valve: The mitral valve is normal in structure. Normal mobility of the mitral valve leaflets. Trivial mitral valve regurgitation. No evidence of mitral valve stenosis. Tricuspid Valve: The tricuspid valve is normal in structure. Tricuspid valve regurgitation is trivial. No evidence of tricuspid stenosis. Aortic Valve: The aortic valve is tricuspid. Aortic valve regurgitation is not visualized. No aortic stenosis is present. Aortic valve mean gradient measures 4.0 mmHg. Aortic valve peak gradient measures 8.0 mmHg. Aortic valve area, by VTI measures 2.11 cm. Pulmonic Valve: The pulmonic valve was normal in structure. Pulmonic valve regurgitation is not visualized. No evidence of  pulmonic stenosis. Aorta: The aortic root is normal in size and structure. Venous: The inferior vena cava is normal in size with greater than 50% respiratory variability, suggesting right atrial pressure of 3 mmHg.  Additional Comments: Normal LV systolic function; grade 1 diastolic dysfunction.  LEFT VENTRICLE PLAX 2D LVIDd:         3.90 cm  Diastology LVIDs:         2.60 cm  LV e' lateral:   10.80 cm/s LV PW:         0.90 cm  LV E/e' lateral: 8.6 LV IVS:        0.90 cm  LV e' medial:    7.07 cm/s LVOT diam:     1.90 cm  LV E/e' medial:  13.2 LV SV:         72 LV SV Index:   52 LVOT Area:     2.84 cm  RIGHT VENTRICLE RV S prime:     12.70 cm/s TAPSE (M-mode): 2.2 cm LEFT ATRIUM             Index LA diam:        2.80 cm 2.01 cm/m LA Vol (A2C):   35.4 ml 25.35 ml/m LA Vol (A4C):   38.9 ml 27.86 ml/m LA Biplane Vol: 39.7 ml 28.43 ml/m  AORTIC VALVE  AV Area (Vmax):    2.27 cm AV Area (Vmean):   2.26 cm AV Area (VTI):     2.11 cm AV Vmax:           141.00 cm/s AV Vmean:          97.700 cm/s AV VTI:            0.342 m AV Peak Grad:      8.0 mmHg AV Mean Grad:      4.0 mmHg LVOT Vmax:         113.00 cm/s LVOT Vmean:        78.000 cm/s LVOT VTI:          0.255 m LVOT/AV VTI ratio: 0.75  AORTA Ao Root diam: 2.90 cm MITRAL VALVE MV Area (PHT): 2.66 cm    SHUNTS MV Decel Time: 285 msec    Systemic VTI:  0.26 m MV E velocity: 93.10 cm/s  Systemic Diam: 1.90 cm MV A velocity: 99.90 cm/s MV E/A ratio:  0.93 Kirk Ruths MD Electronically signed by Kirk Ruths MD Signature Date/Time: 05/05/2019/2:17:46 PM    Final     Assessment/Plan: Diagnosis: R punctate left caudate infarct 1. Does the need for close, 24 hr/day medical supervision in concert with the patient's rehab needs make it unreasonable for this patient to be served in a less intensive setting? Yes 2. Co-Morbidities requiring supervision/potential complications: HTN, HLD, thyroid disease, colitis, depression, alcohol and marijuana abuse 3. Due to  bladder management, bowel management, safety, skin/wound care, disease management, medication administration, pain management and patient education, does the patient require 24 hr/day rehab nursing? Yes 4. Does the patient require coordinated care of a physician, rehab nurse, therapy disciplines of PT, OT, SLP to address physical and functional deficits in the context of the above medical diagnosis(es)? Yes Addressing deficits in the following areas: balance, endurance, locomotion, strength, transferring, bowel/bladder control, bathing, dressing, feeding, grooming, toileting, speech, and psychosocial support 5. Can the patient actively participate in an intensive therapy program of at least 3 hrs of therapy per day at least 5 days per week? Yes 6. The potential for patient to make measurable gains while on inpatient rehab is excellent 7. Anticipated functional outcomes upon discharge from inpatient rehab are modified independent  with PT, modified independent with OT, modified independent with SLP. 8. Estimated rehab length of stay to reach the above functional goals is: 6-7 days 9. Anticipated discharge destination: Home 10. Overall Rehab/Functional Prognosis: excellent  RECOMMENDATIONS: This patient's condition is appropriate for continued rehabilitative care in the following setting: CIR Patient has agreed to participate in recommended program. Yes Note that insurance prior authorization may be required for reimbursement for recommended care.  Comment: Stacie [name] would be an excellent CIR candidate. She has home support of her ex-husband who is retired. Thank you for this consult. We will continue to follow in Stacie [name]' care.   Lavon Paganini Angiulli, PA-C 05/05/2019   I have personally performed a face to face diagnostic evaluation, including, but not limited to relevant history and physical exam findings, of this patient and developed relevant assessment and plan.  Additionally, I have  reviewed and concur with the physician assistant's documentation above.  Leeroy Cha, MD

## 2019-05-05 NOTE — Evaluation (Signed)
Physical Therapy Evaluation Patient Details Name: Stacie [name] MRN: [id number] DOB: 08-Aug-1955 Today's Date: 05/05/2019   History of Present Illness  64 yo female admitted with reports of BIL LE weakness, BP 201/90, hypokalemia CT parenchymal atrophy and chronice small vess ischemica disease. 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 Requiring two person moderate assist for ambulating to and from bathroom. Recommend CIR to address deficits, maximize functional independence, and decrease caregiver burden.     Follow Up Recommendations CIR;Supervision/Assistance - 24 hour    Equipment Recommendations  Other (comment)(TBA)    Recommendations for Other Services Rehab consult     Precautions / Restrictions Precautions Precautions: Fall Restrictions Weight Bearing Restrictions: No      Mobility  Bed Mobility Overal bed mobility: Needs Assistance Bed Mobility: Supine to Sit;Rolling;Sit to Supine Rolling: Min assist   Supine to sit: Mod assist Sit to supine: Min assist   General bed mobility comments: pt with immediate urgency to void bladder and becoming increasingly restless and agitated. pt pulling at bed to attempt to exit the bed.pt requires (A) for trunk elevation and pulling at bed rails pt voiding on toilet with BSC. pt requires (A) to scoot up in the bed upon return. pt fixated on return to  bed  Transfers Overall transfer level: Needs assistance Equipment used: 2 person hand held assist Transfers: Sit to/from Stand Sit to Stand: +2 physical assistance;Min assist         General transfer comment: pt is able to power up but demonstrates L side weakness with heavy lean L. pt using R LE to balance  Ambulation/Gait Ambulation/Gait assistance: Mod assist;+2 physical assistance;+2 safety/equipment Gait Distance (Feet): 15 Feet Assistive device: 2 person hand held assist Gait Pattern/deviations: Step-through pattern;Decreased stride length;Decreased stance time - left     General Gait Details: Heavily reliant on external support, demonstrating left lateral lean, handheld assist for guidance and environmental negotiation. ModA + 2 for balance  Stairs            Wheelchair Mobility    Modified Rankin (Stroke Patients Only) Modified Rankin (Stroke Patients Only) Pre-Morbid Rankin Score: No symptoms Modified Rankin: Moderately severe disability     Balance Overall balance assessment: Needs assistance Sitting-balance support: Bilateral upper extremity supported;Feet supported Sitting balance-Leahy Scale: Fair       Standing balance-Leahy Scale: Poor                               Pertinent Vitals/Pain Pain Assessment: No/denies pain    Home Living Family/patient expects to be discharged to:: Private residence Living Arrangements: Other (Comment)(ex husband) Available Help at Discharge: Family;Available 24 hours/day Type of Home: House Home Access: Stairs to enter   [employer] of Steps: 3 Home Layout: Able to live on main level with bedroom/bathroom Home Equipment: None Additional Comments: x2 daughters present reports ex spouse has 3 cats  in the house but she does not care for animals     Prior Function Level of Independence: Independent               Hand Dominance   Dominant Hand: Right    Extremity/Trunk  Assessment   Upper Extremity Assessment Upper Extremity Assessment: LUE deficits/detail LUE Deficits / Details: holding UE internally rotated and decrease attention to L UE. pt with decrease grasp and fine motor. pt able to place hand around cup but unable to sustain,. AROM shoulder flexion 80 degrees unable to sustain LUE Sensation: decreased light touch LUE Coordination: decreased fine motor;decreased gross motor    Lower Extremity Assessment Lower Extremity Assessment: LLE deficits/detail LLE Deficits / Details: able to perform limited SLR but not hold against resistance. deferred formal MMT as pt distracted by nausea    Cervical / Trunk Assessment Cervical / Trunk Assessment: Normal  Communication   Communication: Expressive difficulties  Cognition Arousal/Alertness: Awake/alert Behavior During Therapy: Impulsive;Anxious;Restless;Agitated Overall Cognitive Status: Impaired/Different from baseline Area of Impairment: Awareness;Safety/judgement;Following commands;Problem solving                       Following Commands: Follows multi-step commands inconsistently;Follows one step commands with increased time Safety/Judgement: Decreased awareness of deficits;Decreased awareness of safety Awareness: Emergent Problem Solving: Slow processing;Difficulty sequencing General Comments: pt needs cues for sequence at times. pt asking for ice chips and stating she needs to throw up at the same time. Pt oriented to location / person/ place and reason for admission. pt with lack of awareness to deficits      General Comments General comments (skin integrity, edema, etc.): daughters report they and their father can provided (A) upon d/c. they strongly want CIR recommendation. daughter states "she needs to have to work to get out of here so that she gets better"    Exercises     Assessment/Plan    PT Assessment Patient needs continued PT services  PT Problem List Decreased  strength;Decreased activity tolerance;Decreased balance;Decreased mobility;Decreased coordination;Decreased cognition;Decreased safety awareness       PT Treatment Interventions DME instruction;Stair training;Gait training;Functional mobility training;Therapeutic activities;Therapeutic exercise;Balance training;Patient/family education    PT Goals (Current goals can be found in the Care Plan section)  Acute Rehab PT Goals Patient Stated Goal: to get ice PT Goal Formulation: With patient/family Time For Goal Achievement: 05/19/19 Potential to Achieve Goals: Good    Frequency Min 4X/week   Barriers to discharge        Co-evaluation PT/OT/SLP Co-Evaluation/Treatment: Yes Reason for Co-Treatment: Complexity of the patient's impairments (multi-system involvement);Necessary to address cognition/behavior during functional activity;For patient/therapist safety;To address functional/ADL transfers PT goals addressed during session: Mobility/safety with mobility OT goals addressed during session: ADL's and self-care;Strengthening/ROM       AM-PAC PT "6 Clicks" Mobility  Outcome Measure Help needed turning from your back to your side while in a flat bed without using bedrails?: None Help needed moving from lying on your back to sitting on the side of a flat bed without using bedrails?: A Lot Help needed moving to and from a bed to a chair (including a wheelchair)?: A Lot Help needed standing up from a chair using your arms (e.g., wheelchair or bedside chair)?: A Lot Help needed to walk in hospital room?: A Lot Help needed climbing 3-5 steps with a railing? : Total 6 Click Score: 13    End of Session Equipment Utilized During Treatment: Gait belt Activity Tolerance: Other (comment)(limited by  nausea/agitation) Patient left: in bed;with bed alarm set;with call bell/phone within reach;with family/visitor present Nurse Communication: Mobility status PT Visit Diagnosis: Unsteadiness on feet  (R26.81);Other abnormalities of gait and mobility (R26.89);History of falling (Z91.81);Difficulty in walking, not elsewhere classified (R26.2)    Time: [id number] PT Time Calculation (min) (ACUTE ONLY): 26 min   Charges:   PT Evaluation $PT Eval Moderate Complexity: 1 Mod            Wyona Almas, PT, DPT Acute Rehabilitation Services Pager [phone] Office [phone]   Deno Etienne 05/05/2019, 2:28 PM

## 2019-05-06 DIAGNOSIS — R636 Underweight: Secondary | ICD-10-CM

## 2019-05-06 LAB — BRAIN NATRIURETIC PEPTIDE: B Natriuretic Peptide: 136 pg/mL — ABNORMAL HIGH (ref 0.0–100.0)

## 2019-05-06 LAB — URINE CULTURE: Culture: 70000 — AB

## 2019-05-06 MED ORDER — FUROSEMIDE 10 MG/ML IJ SOLN
20.0000 mg | Freq: Once | INTRAMUSCULAR | Status: AC
  Administered 2019-05-06: 20 mg via INTRAVENOUS
  Filled 2019-05-06: qty 4

## 2019-05-06 MED ORDER — ENSURE ENLIVE PO LIQD
237.0000 mL | Freq: Two times a day (BID) | ORAL | Status: DC
  Administered 2019-05-07 – 2019-05-12 (×9): 237 mL via ORAL
  Filled 2019-05-06: qty 237

## 2019-05-06 MED ORDER — ADULT MULTIVITAMIN W/MINERALS CH
1.0000 | ORAL_TABLET | Freq: Every day | ORAL | Status: DC
  Administered 2019-05-07 – 2019-05-12 (×6): 1 via ORAL
  Filled 2019-05-06 (×6): qty 1

## 2019-05-06 NOTE — Progress Notes (Signed)
Physical Therapy Treatment Patient Details Name: Stacie [name] MRN: [id number] DOB: 03-06-1955 Today's Date: 05/06/2019    History of Present Illness 64 yo female admitted with reports of BIL LE weakness, BP 201/90, hypokalemia CT parenchymal atrophy and chronice small vess ischemica disease. 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 Patient with improved overall activity tolerance but still demonstrates deficits in gait and balance impeding safety. Patient continues to require hands on assist and compensation mechanisms for management of dizziness. Worked on target training and gait speed deviations with assist. Current POC remains appropriate, CIR recommended.   Follow Up Recommendations  CIR;Supervision/Assistance - 24 hour     Equipment Recommendations  Other (comment)(TBA)    Recommendations for Other Services Rehab consult     Precautions / Restrictions Precautions Precautions: Fall Restrictions Weight Bearing Restrictions: No    Mobility  Bed Mobility Overal bed mobility: Needs Assistance Bed Mobility: Supine to Sit;Rolling;Sit to Supine Rolling: Min assist   Supine to sit: Mod assist Sit to supine: Min assist   General bed mobility comments: Patient with dizziness upon reaching EOB, increased time to acclimate and multi modal cues to calm patient at EOB. Modarate assist during transition to upright, min assist for safety to return to supine.  Transfers Overall transfer level: Needs assistance Equipment used: 1 person hand held assist Transfers:  Sit to/from Stand Sit to Stand: Min assist         General transfer comment: Min assist for safety and stability  Ambulation/Gait Ambulation/Gait assistance: Mod assist;+2 physical assistance;+2 safety/equipment Gait Distance (Feet): 70 Feet Assistive device: 1 person hand held assist Gait Pattern/deviations: Step-through pattern;Decreased stride length;Decreased stance time - left Gait velocity: decreased Gait velocity interpretation: <1.31 ft/sec, indicative of household ambulator General Gait Details: Heavy reliance on UE support for tactile cues and awareness of proprioception. Some ataxic gait with slow cadence, improved with increased speed. (+2 for line management)   Stairs             Wheelchair Mobility    Modified Rankin (Stroke Patients Only) Modified Rankin (Stroke Patients Only) Pre-Morbid Rankin Score: No symptoms Modified Rankin: Moderately severe disability     Balance Overall balance assessment: Needs assistance Sitting-balance support: Bilateral upper extremity supported;Feet supported Sitting balance-Leahy Scale: Fair     Standing balance support: Bilateral upper extremity supported Standing balance-Leahy Scale: Poor                              Cognition Arousal/Alertness: Awake/alert Behavior During Therapy: Impulsive;Anxious;Restless;Agitated Overall Cognitive Status: Impaired/Different from baseline Area of Impairment: Awareness;Safety/judgement;Following commands;Problem solving                       Following Commands: Follows multi-step commands inconsistently;Follows one step commands with increased time Safety/Judgement: Decreased awareness of deficits;Decreased awareness of safety Awareness: Emergent Problem Solving: Slow processing;Difficulty sequencing General Comments: patient with impulsivity and axiety with mobility due to unsteadiness and nausea.        Exercises  General Comments         Pertinent Vitals/Pain      Home Living                      Prior Function            PT Goals (current goals can now be found in the care plan section) Acute Rehab PT Goals Patient Stated Goal: to go home PT Goal Formulation: With patient/family Time For Goal Achievement: 05/19/19 Potential to Achieve Goals: Good Progress towards PT goals: Progressing toward goals    Frequency    Min 4X/week      PT Plan Current plan remains appropriate    Co-evaluation              AM-PAC PT "6 Clicks" Mobility   Outcome Measure  Help needed turning from your back to your side while in a flat bed without using bedrails?: None Help needed moving from lying on your back to sitting on the side of a flat bed without using bedrails?: A Lot Help needed moving to and from a bed to a chair (including a wheelchair)?: A Lot Help needed standing up from a chair using your arms (e.g., wheelchair or bedside chair)?: A Lot Help needed to walk in hospital room?: A Lot Help needed climbing 3-5 steps with a railing? : Total 6 Click Score: 13    End of Session Equipment Utilized During Treatment: Gait belt Activity Tolerance: Other (comment)(limited by nausea/agitation) Patient left: in bed;with bed alarm set;with call bell/phone within reach;with family/visitor present Nurse Communication: Mobility status PT Visit Diagnosis: Unsteadiness on feet (R26.81);Other abnormalities of gait and mobility (R26.89);History of falling (Z91.81);Difficulty in walking, not elsewhere classified (R26.2)     Time: [id number] PT Time Calculation (min) (ACUTE ONLY): 19 min  Charges:  $Gait Training: 8-22 mins                     Alben Deeds, PT DPT  Board Certified Neurologic Specialist [place] Pager [phone] Office [place] 05/06/2019, 10:44 AM

## 2019-05-06 NOTE — Progress Notes (Signed)
PROGRESS NOTE  Stacie [name] [id number] DOB: August 02, 1955 DOA: 05/04/2019 PCP: [name], NP  HPI/Recap of past 90 hours: 64 year old female with past medical history of hypertension, polysubstance abuse and previous CVA presented to the emergency room on the morning of Saturday, 3/27, with acute onset of left facial droop with difficulty speaking and weakness in both legs.  Patient thought that this might go away on its own and did not immediately seek medical attention.  Symptoms continue to persist.  She stopped drinking a day later.  When she came into the emergency room finally on the morning of 4/1, she is noted to have a blood pressure of 201/90.  Head CT noted age-indeterminate infarct within the right frontal lobe, white matter and right basal ganglia possibly acute or subacute.  MRI noted an 8 mm acute infarct in the posterior limb of the internal capsule on the right with punctate acute infarction in either the left caudate head or white matter adjacent to the frontal horn of the left lateral ventricle and 8 mm acute to subacute infarction of the white matter adjacent to the atrium of the right lateral ventricle plus old small vessel infarctions.  Patient was brought in the hospital service for further evaluation.  Neurology was consulted.  Work-up notes cause likely from small vessel disease.  Started on 3 weeks of Plavix plus a daily aspirin.  PT and OT recommended inpatient rehab.  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  Work-up revealed an A1c which was normal at 5.5, but an LDL of 125.  Echocardiogram noted no evidence of embolus.  Patient previous had not been on any type of antiplatelet agent and started on aspirin 81 mg daily and Plavix was added for 3 weeks.  Patient seen by PT and OT recommending  inpatient rehab.  Awaiting for CIR evaluation.  Patient advised to stop drinking and smoking, including marijuana.  Active Problems:   Essential hypertension: Allowing for permissive hypertension.  Better blood pressure control long-term.  Stopping her alcohol will help with this.    PAD (peripheral artery disease) ([place]): Aspirin should help with this.  Underweight: BMI of 15.  Seen by nutrition.  Started on Ensure twice daily plus multivitamin.  High risk for refeeding syndrome.  Watching electrolytes.  She has been replacing calories with alcohol    Depression: Continue SSRI.    Tobacco abuse: Nicotine patch.  Polysubstance including alcohol and marijuana use: Providing patient counseling options to quit.  Since she decided to come in 5 days after symptoms started, she is already outside of the window for withdrawals     Chronic diastolic CHF (congestive heart failure) ([place]): BNP only minimally elevated so we will give 1 dose of Lasix.  Overall euvolemic.  Incidentally noted on echocardiogram  Hyperlipidemia: As above, have started statin.  Code Status: Full code  Family Communication: Left message for daughter  Disposition Plan: Awaiting CIR approval   Consultants:  CIR  Neurology  Procedures:  Echocardiogram done 4/2 noting diastolic dysfunction, no evidence of thrombus  Antimicrobials:  None  DVT prophylaxis: SCDs   Objective: Vitals:   05/06/19 0752 05/06/19 1255  BP: (!) 193/86 (!) 164/79  Pulse: 82 76  Resp: 18 16  Temp: 97.8 F (36.6 C) 99.1 F (37.3 C)  SpO2: 99% 100%    Intake/Output Summary (Last 24 hours) at 05/06/2019 1542 Last data  filed at 05/05/2019 2359 Gross per 24 hour  Intake 1000 ml  Output 625 ml  Net 375 ml   There were no vitals filed for this visit. Body mass index is 15.3 kg/m.  Exam:   General: Resting comfortably  HEENT: Normocephalic atraumatic, mucous membrane slightly dry  Neck: Supple, no JVD  Cardiovascular:  Regular rate and rhythm, [id number], 2-6 systolic ejection murmur  Respiratory: Clear to auscultation bilaterally  Abdomen: Soft, nontender, nondistended, positive bowel sounds  Musculoskeletal: No clubbing or cyanosis or edema  Skin: No skin breaks or tears or lesions  Psychiatry: Withdrawn, flattened affect, somnolent  Neuro: Limited exam due to patient's somnolence   Data Reviewed: CBC: Recent Labs  Lab 05/04/19 1536  WBC 10.0  NEUTROABS 8.0*  HGB 14.0  HCT 44.8  MCV 90.1  PLT 99991111   Basic Metabolic Panel: Recent Labs  Lab 05/04/19 1536  NA 139  K 3.3*  CL 103  CO2 22  GLUCOSE 109*  BUN 16  CREATININE 0.81  CALCIUM 9.1   GFR: CrCl cannot be calculated (Unknown ideal weight.). Liver Function Tests: Recent Labs  Lab 05/04/19 1536  AST 25  ALT 16  ALKPHOS 81  BILITOT 1.0  PROT 6.9  ALBUMIN 4.1   Recent Labs  Lab 05/04/19 1536  LIPASE 26   No results for input(s): AMMONIA in the last 168 hours. Coagulation Profile: No results for input(s): INR, PROTIME in the last 168 hours. Cardiac Enzymes: No results for input(s): CKTOTAL, CKMB, CKMBINDEX, TROPONINI in the last 168 hours. BNP (last 3 results) No results for input(s): PROBNP in the last 8760 hours. HbA1C: Recent Labs    05/05/19 0538  HGBA1C 5.5   CBG: No results for input(s): GLUCAP in the last 168 hours. Lipid Profile: Recent Labs    05/05/19 0538  CHOL 198  HDL 50  LDLCALC 125*  TRIG 113  CHOLHDL 4.0   Thyroid Function Tests: Recent Labs    05/04/19 1536  TSH 1.609   Anemia Panel: No results for input(s): VITAMINB12, FOLATE, FERRITIN, TIBC, IRON, RETICCTPCT in the last 72 hours. Urine analysis:    Component Value Date/Time   COLORURINE YELLOW 05/04/2019 1730   APPEARANCEUR HAZY (A) 05/04/2019 1730   LABSPEC 1.024 05/04/2019 1730   PHURINE 5.0 05/04/2019 1730   GLUCOSEU NEGATIVE 05/04/2019 1730   HGBUR NEGATIVE 05/04/2019 1730   BILIRUBINUR NEGATIVE 05/04/2019 1730    KETONESUR 80 (A) 05/04/2019 1730   PROTEINUR 100 (A) 05/04/2019 1730   NITRITE NEGATIVE 05/04/2019 1730   LEUKOCYTESUR NEGATIVE 05/04/2019 1730   Sepsis Labs: @LABRCNTIP (procalcitonin:4,lacticidven:4)  ) Recent Results (from the past 240 hour(s))  Urine culture     Status: Abnormal   Collection Time: 05/04/19  5:22 PM   Specimen: Urine, Random  Result Value Ref Range Status   Specimen Description URINE, RANDOM  Final   Special Requests   Final    NONE Performed at [facility], [place] [address] [place], [place] [address]    Culture (A)  Final    70,000 COLONIES/mL ESCHERICHIA COLI Confirmed Extended Spectrum Beta-Lactamase Producer (ESBL).  In bloodstream infections from ESBL organisms, carbapenems are preferred over piperacillin/tazobactam. They are shown to have a lower risk of mortality.    Report Status 05/06/2019 FINAL  Final   Organism ID, Bacteria ESCHERICHIA COLI (A)  Final      Susceptibility   Escherichia coli - MIC*    AMPICILLIN >=32 RESISTANT Resistant     CEFAZOLIN >=64 RESISTANT  Resistant     CEFTRIAXONE >=64 RESISTANT Resistant     CIPROFLOXACIN <=0.25 SENSITIVE Sensitive     GENTAMICIN <=1 SENSITIVE Sensitive     IMIPENEM <=0.25 SENSITIVE Sensitive     NITROFURANTOIN <=16 SENSITIVE Sensitive     TRIMETH/SULFA <=20 SENSITIVE Sensitive     AMPICILLIN/SULBACTAM >=32 RESISTANT Resistant     PIP/TAZO <=4 SENSITIVE Sensitive     * 70,000 COLONIES/mL ESCHERICHIA COLI  SARS CORONAVIRUS 2 (TAT 6-24 HRS) Nasopharyngeal Nasopharyngeal Swab     Status: None   Collection Time: 05/04/19 10:46 PM   Specimen: Nasopharyngeal Swab  Result Value Ref Range Status   SARS Coronavirus 2 NEGATIVE NEGATIVE Final    Comment: (NOTE) SARS-CoV-2 target nucleic acids are NOT DETECTED. The SARS-CoV-2 RNA is generally detectable in upper and lower respiratory specimens during the acute phase of infection. Negative results do not preclude SARS-CoV-2 infection, do not rule  out co-infections with other pathogens, and should not be used as the sole basis for treatment or other patient management decisions. Negative results must be combined with clinical observations, patient history, and epidemiological information. The expected result is Negative. Fact Sheet for Patients: SugarRoll.be Fact Sheet for Healthcare Providers: https://www.woods-mathews.com/ This test is not yet approved or cleared by the [place] FDA and  has been authorized for detection and/or diagnosis of SARS-CoV-2 by FDA under an Emergency Use Authorization (EUA). This EUA will remain  in effect (meaning this test can be used) for the duration of the COVID-19 declaration under Section 56 4(b)(1) of the Act, 21 U.S.C. section 360bbb-3(b)(1), unless the authorization is terminated or revoked sooner. Performed at [facility], [place] [address] [place], [place] [address]       Studies: No results found.  Scheduled Meds:   stroke: mapping our early stages of recovery book   Does not apply Once   amLODipine  10 mg Oral Daily   aspirin EC  81 mg Oral Daily   clopidogrel  75 mg Oral Daily   enoxaparin (LOVENOX) injection  40 mg Subcutaneous Q24H   escitalopram  20 mg Oral Daily   feeding supplement (ENSURE ENLIVE)  237 mL Oral BID BM   furosemide  20 mg Intravenous Once   [START ON 05/07/2019] multivitamin with minerals  1 tablet Oral Daily   nicotine  14 mg Transdermal Daily   simvastatin  20 mg Oral q1800    Continuous Infusions:    LOS: 1 day     Annita Brod, MD [facility]   05/06/2019, 3:42 PM

## 2019-05-06 NOTE — Progress Notes (Signed)
Initial Nutrition Assessment  DOCUMENTATION CODES:   Underweight  INTERVENTION:   Ensure Enlive po BID, each supplement provides 350 kcal and 20 grams of protein  MVI daily  Pt likely at high refeed risk; recommend monitor K, Mg and P labs daily as oral intake improves.   NUTRITION DIAGNOSIS:   Inadequate oral intake related to acute illness(CVA) as evidenced by other (comment)(per chart review).  GOAL:   Patient will meet greater than or equal to 90% of their needs  MONITOR:   PO intake, Supplement acceptance, Labs, Weight trends, Skin, I & O's  REASON FOR ASSESSMENT:   Consult Assessment of nutrition requirement/status  ASSESSMENT:   64 y.o. Caucasian female with a known history of hypertension, CHF, dyslipidemia and tobacco, marijuana and alcohol abuse as well as depression who is admitted with CVA   RD working remotely.  Unable to reach pt by phone. Suspect pt with poor appetite and oral intake at baseline r/t substance abuse as pt is underweight. Per chart, pt appears fairly weight stable at baseline. RD will add supplements to help pt meet her estimated needs. Pt is likely at refeed risk. RD will obtain nutrition related history and exam at follow-up.    Pt at high risk for malnutrition but unable to diagnose at this time as NFPE cannot be performed.   Medications reviewed and include: aspirin, plavix, lovenox, nicotine, NaCl @100ml /hr   Labs reviewed: BNP 136(H)  NUTRITION - FOCUSED PHYSICAL EXAM: Unable to complete at this time  Diet Order:   Diet Order            DIET DYS 3 Room service appropriate? Yes; Fluid consistency: Thin  Diet effective now             EDUCATION NEEDS:   No education needs have been identified at this time  Skin:  Skin Assessment: Reviewed RN Assessment(ecchymosis, wound buttocks)  Last BM:  pta  Height:   Ht Readings from Last 1 Encounters:  05/06/19 5\' 4"  (1.626 m)    Weight:   Wt Readings from Last 1  Encounters:  10/06/18 40.4 kg    Ideal Body Weight:     BMI:  Body mass index is 15.3 kg/m.  Estimated Nutritional Needs:   Kcal:  1300-1500kcal/day  Protein:  65-75g/day  Fluid:  >1.2L/day  Koleen Distance MS, RD, LDN Please refer to [facility] for RD and/or RD on-call/weekend/after hours pager

## 2019-05-07 ENCOUNTER — Inpatient Hospital Stay (HOSPITAL_COMMUNITY): Payer: Medicare Other

## 2019-05-07 LAB — BASIC METABOLIC PANEL
Anion gap: 17 — ABNORMAL HIGH (ref 5–15)
Anion gap: 15 (ref 5–15)
BUN: 6 mg/dL — ABNORMAL LOW (ref 8–23)
BUN: 11 mg/dL (ref 8–23)
CO2: 23 mmol/L (ref 22–32)
CO2: 22 mmol/L (ref 22–32)
Calcium: 9 mg/dL (ref 8.9–10.3)
Calcium: 9.6 mg/dL (ref 8.9–10.3)
Chloride: 99 mmol/L (ref 98–111)
Chloride: 104 mmol/L (ref 98–111)
Creatinine, Ser: 0.79 mg/dL (ref 0.44–1.00)
Creatinine, Ser: 0.96 mg/dL (ref 0.44–1.00)
GFR calc Af Amer: >60 mL/min (ref >60)
GFR calc Af Amer: >60 mL/min (ref >60)
GFR calc non Af Amer: >60 mL/min (ref >60)
GFR calc non Af Amer: >60 mL/min (ref >60)
Glucose, Bld: 70 mg/dL (ref 70–99)
Glucose, Bld: 102 mg/dL — ABNORMAL HIGH (ref 70–99)
Potassium: 2.7 mmol/L — CL (ref 3.5–5.1)
Potassium: 4 mmol/L (ref 3.5–5.1)
Sodium: 139 mmol/L (ref 135–145)
Sodium: 141 mmol/L (ref 135–145)

## 2019-05-07 LAB — LIPASE, BLOOD: Lipase: 28 U/L (ref 11–51)

## 2019-05-07 LAB — PHOSPHORUS: Phosphorus: 3.5 mg/dL (ref 2.5–4.6)

## 2019-05-07 LAB — MAGNESIUM: Magnesium: 1.8 mg/dL (ref 1.7–2.4)

## 2019-05-07 MED ORDER — POTASSIUM CHLORIDE CRYS ER 20 MEQ PO TBCR
40.0000 meq | EXTENDED_RELEASE_TABLET | ORAL | Status: AC
  Administered 2019-05-07 (×3): 40 meq via ORAL
  Filled 2019-05-07 (×3): qty 2

## 2019-05-07 MED ORDER — ONDANSETRON HCL 4 MG/2ML IJ SOLN
4.0000 mg | Freq: Four times a day (QID) | INTRAMUSCULAR | Status: DC | PRN
  Administered 2019-05-07 – 2019-05-08 (×2): 4 mg via INTRAVENOUS
  Filled 2019-05-07 (×2): qty 2

## 2019-05-07 MED ORDER — NYSTATIN 100000 UNIT/ML MT SUSP
5.0000 mL | Freq: Three times a day (TID) | OROMUCOSAL | Status: DC
  Administered 2019-05-07 – 2019-05-12 (×14): 500000 [IU] via ORAL
  Filled 2019-05-07 (×14): qty 5

## 2019-05-07 NOTE — Progress Notes (Signed)
  Speech Language Pathology Treatment: Cognitive-Linquistic(Dysarthria)  Patient Details Name: Stacie [name] MRN: [id number] DOB: 1955-02-25 Today's Date: 05/07/2019 Time: [id number] SLP Time Calculation (min) (ACUTE ONLY): 12 min  Assessment / Plan / Recommendation Clinical Impression  Pt was seen for dysarthria treatment and was cooperative during the session. Pt and her daughter were educated regarding the nature of dysarthria, and compensatory strategies to improve speech intelligibility. Both parties verbalized understanding regarding all areas of education and all their questions were answered. Reinforcement will be needed considering the pt's noted memory deficits. Pt used compensatory strategies at the phrase level with 70% accuracy increasing to 100% accuracy with min. cues for overarticulation. Towards the end of the session pt was noted to independently use the strategies targeted and to self-correct when they were not used. SLP will continue to follow pt.    HPI HPI: Pt is a 64 y.o. Caucasian female with a known history of hypertension, dyslipidemia and tobacco, marijuana and alcohol abuse as well as depression, who presented to the emergency room with acute onset of left facial droop with dysarthria and weakness in both legs. 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 Punctate acute infarction either in the left caudate head or white matter adjacent to the frontal horn of the left lateral ventricle. 8 mm acute to subacute infarction in the white matter adjacent to the atrium of the right lateral ventricle.       SLP Plan  Continue with current plan of care  Patient needs continued Speech Lanaguage Pathology Services    Recommendations                   Follow up Recommendations: Inpatient Rehab SLP Visit Diagnosis: Cognitive communication deficit (R41.841);Dysarthria and anarthria (R47.1) Plan: Continue with current plan of  care       Stacie [name] I. Hardin Negus, [place], [place] Office number [phone] Pager [phone]                Stacie [name] 05/07/2019, 5:45 PM

## 2019-05-07 NOTE — Evaluation (Signed)
Speech Language Pathology Evaluation Patient Details Name: Stacie [name] MRN: [id number] DOB: 02-Jun-1955 Today's Date: 05/07/2019 Time: [id number] SLP Time Calculation (min) (ACUTE ONLY): 17 min  Problem List:  Patient Active Problem List   Diagnosis Date Noted  . Underweight 05/05/2019  . Depression 05/05/2019  . Tobacco abuse 05/05/2019  . Marijuana use 05/05/2019  . Alcohol abuse 05/05/2019  . Chronic diastolic CHF (congestive heart failure) ([place]) 05/05/2019  . CVA (cerebral vascular accident) ([place]) 05/04/2019  . Chronic diarrhea 08/26/2018  . Loss of weight 08/26/2018  . PAD (peripheral artery disease) ([place]) 12/06/2017  . Claudication of right lower extremity ([place]) 12/06/2017  . Hyperlipidemia 02/28/2016  . Essential hypertension 02/21/2016  . 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 Hearing loss 02/21/2016   Past Medical History:  Past Medical History:  Diagnosis Date  . Colitis   . Hyperlipidemia   . Hypertension   . Stroke ([place]) 04/2012   unknown type  . Thyroid disease    Past Surgical History: History reviewed. No pertinent surgical history. HPI:  Pt is a 64 y.o. Caucasian female with a known history of hypertension, dyslipidemia and tobacco, marijuana and alcohol abuse as well as depression, who presented to the emergency room with acute onset of left facial droop with dysarthria and weakness in both legs. 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 Punctate acute infarction either in the left caudate head or white matter adjacent to the frontal horn of the left lateral ventricle. 8 mm acute to subacute infarction in the white matter adjacent to the atrium of the right lateral ventricle.    Assessment / Plan / Recommendation Clinical Impression  Pt participated in speech/language/cognition evaluation with her daughter present. Pt reported some slight age-related memory difficulty at baseline  but stated that she was able to function adequately with use of external memory aids. Pt described her speech as "slurred" and indicated that she believes it is approximately 40% back to baseline. Oral mechanism exam revealed left-sided facial weakness and reduced lingual ROM and strength. White lingual coating was noted suggesting possible thrush and RN was advised of this.   [facility] Mental Status Examination was completed to evaluate the pt's cognitive-linguistic skills. She achieved a score of 13/30 which is below the normal limits of 27 or more out of 30. She exhibited difficulty in the areas of awareness, attention, memory, complex problem solving, and executive function. She also presented with moderate dysarthria characterized by reduced articulatory precision and impaired breath support which negatively impacted speech intelligibility at the sentence and conversational levels. Skilled SLP services are clinically indicated at this time to improve motor speech and cognitive-linguistic function.    SLP Assessment  SLP Recommendation/Assessment: Patient needs continued Speech Lanaguage Pathology Services SLP Visit Diagnosis: Cognitive communication deficit (R41.841);Dysarthria and anarthria (R47.1)    Follow Up Recommendations  Inpatient Rehab    Frequency and Duration min 2x/week  2 weeks      SLP Evaluation Cognition  Overall Cognitive Status: Impaired/Different from baseline Arousal/Alertness: Awake/alert Orientation Level: Oriented X4 Attention: Focused;Sustained Focused Attention: Impaired Focused Attention Impairment: Verbal complex Sustained Attention: Impaired Sustained Attention Impairment: Verbal complex Memory: Impaired Memory Impairment: Retrieval deficit;Storage deficit;Decreased recall of new information(Immediate: 5/5; delayed: 4/5) Awareness: Impaired Awareness Impairment: Emergent impairment Problem Solving: Impaired Problem Solving Impairment:  Verbal complex Executive Function: Reasoning Reasoning: Impaired Reasoning Impairment: Verbal complex       Comprehension  Auditory Comprehension  Overall Auditory Comprehension: Appears within functional limits for tasks assessed Yes/No Questions: Within Functional Limits Commands: Within Functional Limits Conversation: Complex Reading Comprehension Reading Status: Not tested    Expression Expression Primary Mode of Expression: Verbal Verbal Expression Overall Verbal Expression: Appears within functional limits for tasks assessed Initiation: No impairment Level of Generative/Spontaneous Verbalization: Conversation Repetition: No impairment Naming: No impairment   Oral / Motor  Oral Motor/Sensory Function Overall Oral Motor/Sensory Function: Moderate impairment Facial ROM: Reduced left;Suspected CN VII (facial) dysfunction Facial Symmetry: Abnormal symmetry left;Suspected CN VII (facial) dysfunction Facial Strength: Reduced left;Suspected CN VII (facial) dysfunction Facial Sensation: Within Functional Limits Lingual ROM: Reduced left;Suspected CN XII (hypoglossal) dysfunction Lingual Symmetry: Abnormal symmetry left;Suspected CN XII (hypoglossal) dysfunction Lingual Strength: Reduced;Suspected CN XII (hypoglossal) dysfunction Lingual Sensation: Within Functional Limits Velum: Within Functional Limits Mandible: Within Functional Limits Motor Speech Overall Motor Speech: Impaired Respiration: Impaired Level of Impairment: Sentence Phonation: Normal Resonance: Within functional limits Articulation: Impaired Level of Impairment: Sentence Intelligibility: Intelligibility reduced Word: 75-100% accurate Phrase: 50-74% accurate Sentence: 50-74% accurate Conversation: 25-49% accurate Motor Planning: Witnin functional limits Motor Speech Errors: Aware;Consistent Effective Techniques: Slow rate;Increased vocal intensity;Over-articulate   Stacie [name], [place], [place] Office number [phone] Pager [phone]                   BRENE SESSIONS 05/07/2019, 5:39 PM

## 2019-05-07 NOTE — Progress Notes (Addendum)
PROGRESS NOTE    Stacie [name]  [id number] DOB: 1955/05/19 DOA: 05/04/2019 PCP: [name], NP   Brief Narrative:  64 year old with history of HTN, polysubstance abuse, previous CVA came to the with acute onset of left-sided facial droop, dysarthria.  Initially she thought the symptoms would go away on its own therefore did not seek medical attention but eventually ended up coming to the ER on 4/1.  CT of the head showed age-indeterminate infarct.  MRI brain showed multifocal infarct.  Neurology team was consulted.  Recommended aspirin and Plavix for 3 weeks.  Assessment & Plan:   Principal Problem:   CVA (cerebral vascular accident) ([place]) Active Problems:   Essential hypertension   Hyperlipidemia   PAD (peripheral artery disease) (H[facility])   Underweight   Depression   Tobacco abuse   Marijuana use   Alcohol abuse   Chronic diastolic CHF (congestive heart failure) (HCC)  Acute multifocal white matter CVA -CT of the head showed indeterminate infarct, MRI brain confirmed multifocal infarction.  No large vessel occlusion seen on CTA of the head neck -Echocardiogram-EF 60-65%. -LDL 125, A1c 5.5 -Recommending aspirin 81 mg, Plavix 75 mg daily for 3 weeks followed by aspirin alone. -PT recommended CIR  Hypokalemia -Aggressive repletion.  Due to her malnourishment, she is at risk for refeeding syndrome therefore we will closely monitor electrolytes and replete as necessary.  Nonspecific nausea -Check Lipase, AXR. Zofran prn  Essential hypertension -Slight permissive hypertension, normalize over the next week  Hyperlipidemia -LDL 125.  Lipitor 40 mg daily  Peripheral arterial disease -Daily aspirin and statin  Chronic diastolic congestive heart failure -Patient appears to be euvolemic.  Echocardiogram showed EF of 60-65%  Severe protein calorie malnutrition -Seen by dietitian.  Supplements have been ordered.  Polysubstance abuse-alcohol, marijuana Tobacco  use -Counseled to quit using this  Had extensive discussion with daughter regarding her drinking habits and medical compliance.  All the questions have been answered.  DVT prophylaxis: Lovenox Code Status: Full code Family Communication:  Daughter at bedside  Disposition Plan:   Patient From= Home   Patient Anticipated D/C place= CIR  Barriers= Awaiting CIR authorization.   Subjective: No complaints she still has dysarthric speech.  Daughter at bedside.  Review of Systems Otherwise negative except as per HPI, including: General: Denies fever, chills, night sweats or unintended weight loss. Resp: Denies cough, wheezing, shortness of breath. Cardiac: Denies chest pain, palpitations, orthopnea, paroxysmal nocturnal dyspnea. GI: Denies abdominal pain, nausea, vomiting, diarrhea or constipation GU: Denies dysuria, frequency, hesitancy or incontinence MS: Denies muscle aches, joint pain or swelling Neuro: Denies headache, neurologic deficits (focal weakness, numbness, tingling), abnormal gait Psych: Denies anxiety, depression, SI/HI/AVH Skin: Denies new rashes or lesions ID: Denies sick contacts, exotic exposures, travel  Examination:  General exam: Appears calm and comfortable  Respiratory system: Clear to auscultation. Respiratory effort normal. Cardiovascular system: S1 & S2 heard, RRR. No JVD, murmurs, rubs, gallops or clicks. No pedal edema. Gastrointestinal system: Abdomen is nondistended, soft and nontender. No organomegaly or masses felt. Normal bowel sounds heard. Central nervous system: Alert awake oriented.  She still has dysarthric speech. Extremities: Symmetric 3 x 5 power. Skin: No rashes, lesions or ulcers Psychiatry: Judgement and insight appear normal. Mood & affect appropriate.     Objective: Vitals:   05/06/19 2025 05/06/19 2356 05/07/19 0337 05/07/19 0733  BP: (!) 160/83 (!) 179/85 (!) 161/86 (!) 171/81  Pulse: 64 69 70 63  Resp: 18 20 19 20   Temp: 98.4  F (36.9 C) 98.3 F (36.8 C) 98 F (36.7 C) 98.4 F (36.9 C)  TempSrc: Oral Oral Oral Oral  SpO2: 100% 98% 99% 97%  Height:        Intake/Output Summary (Last 24 hours) at 05/07/2019 [id number] Last data filed at 05/07/2019 [id number] Gross per 24 hour  Intake --  Output 600 ml  Net -600 ml   There were no vitals filed for this visit.   Data Reviewed:   CBC: Recent Labs  Lab 05/04/19 1536  WBC 10.0  NEUTROABS 8.0*  HGB 14.0  HCT 44.8  MCV 90.1  PLT 99991111   Basic Metabolic Panel: Recent Labs  Lab 05/04/19 1536 05/07/19 0408  NA 139 139  K 3.3* 2.7*  CL 103 99  CO2 22 23  GLUCOSE 109* 70  BUN 16 6*  CREATININE 0.81 0.79  CALCIUM 9.1 9.0   GFR: CrCl cannot be calculated (Unknown ideal weight.). Liver Function Tests: Recent Labs  Lab 05/04/19 1536  AST 25  ALT 16  ALKPHOS 81  BILITOT 1.0  PROT 6.9  ALBUMIN 4.1   Recent Labs  Lab 05/04/19 1536  LIPASE 26   No results for input(s): AMMONIA in the last 168 hours. Coagulation Profile: No results for input(s): INR, PROTIME in the last 168 hours. Cardiac Enzymes: No results for input(s): CKTOTAL, CKMB, CKMBINDEX, TROPONINI in the last 168 hours. BNP (last 3 results) No results for input(s): PROBNP in the last 8760 hours. HbA1C: Recent Labs    05/05/19 0538  HGBA1C 5.5   CBG: No results for input(s): GLUCAP in the last 168 hours. Lipid Profile: Recent Labs    05/05/19 0538  CHOL 198  HDL 50  LDLCALC 125*  TRIG 113  CHOLHDL 4.0   Thyroid Function Tests: Recent Labs    05/04/19 1536  TSH 1.609   Anemia Panel: No results for input(s): VITAMINB12, FOLATE, FERRITIN, TIBC, IRON, RETICCTPCT in the last 72 hours. Sepsis Labs: Recent Labs  Lab 05/04/19 1600  LATICACIDVEN 0.9    Recent Results (from the past 240 hour(s))  Urine culture     Status: Abnormal   Collection Time: 05/04/19  5:22 PM   Specimen: Urine, Random  Result Value Ref Range Status   Specimen Description URINE, RANDOM  Final    Special Requests   Final    NONE Performed at [facility], [address], [place], [place] [address]    Culture (A)  Final    70,000 COLONIES/mL ESCHERICHIA COLI Confirmed Extended Spectrum Beta-Lactamase Producer (ESBL).  In bloodstream infections from ESBL organisms, carbapenems are preferred over piperacillin/tazobactam. They are shown to have a lower risk of mortality.    Report Status 05/06/2019 FINAL  Final   Organism ID, Bacteria ESCHERICHIA COLI (A)  Final      Susceptibility   Escherichia coli - MIC*    AMPICILLIN >=32 RESISTANT Resistant     CEFAZOLIN >=64 RESISTANT Resistant     CEFTRIAXONE >=64 RESISTANT Resistant     CIPROFLOXACIN <=0.25 SENSITIVE Sensitive     GENTAMICIN <=1 SENSITIVE Sensitive     IMIPENEM <=0.25 SENSITIVE Sensitive     NITROFURANTOIN <=16 SENSITIVE Sensitive     TRIMETH/SULFA <=20 SENSITIVE Sensitive     AMPICILLIN/SULBACTAM >=32 RESISTANT Resistant     PIP/TAZO <=4 SENSITIVE Sensitive     * 70,000 COLONIES/mL ESCHERICHIA COLI  SARS CORONAVIRUS 2 (TAT 6-24 HRS) Nasopharyngeal Nasopharyngeal Swab     Status: None   Collection Time: 05/04/19 10:46  PM   Specimen: Nasopharyngeal Swab  Result Value Ref Range Status   SARS Coronavirus 2 NEGATIVE NEGATIVE Final    Comment: (NOTE) SARS-CoV-2 target nucleic acids are NOT DETECTED. The SARS-CoV-2 RNA is generally detectable in upper and lower respiratory specimens during the acute phase of infection. Negative results do not preclude SARS-CoV-2 infection, do not rule out co-infections with other pathogens, and should not be used as the sole basis for treatment or other patient management decisions. Negative results must be combined with clinical observations, patient history, and epidemiological information. The expected result is Negative. Fact Sheet for Patients: SugarRoll.be Fact Sheet for Healthcare Providers: https://www.woods-mathews.com/ This test  is not yet approved or cleared by the [place] FDA and  has been authorized for detection and/or diagnosis of SARS-CoV-2 by FDA under an Emergency Use Authorization (EUA). This EUA will remain  in effect (meaning this test can be used) for the duration of the COVID-19 declaration under Section 56 4(b)(1) of the Act, 21 U.S.C. section 360bbb-3(b)(1), unless the authorization is terminated or revoked sooner. Performed at [facility], [place] [address] [place], [place] [address]          Radiology Studies: ECHOCARDIOGRAM COMPLETE  Result Date: 05/05/2019    ECHOCARDIOGRAM REPORT   Patient Name:   ESABELLE SMACK Date of Exam: 05/05/2019 Medical Rec #:  [id number]       Height:       64.6 in Accession #:    [id number]      Weight:       89.1 lb Date of Birth:  1955-10-19        BSA:          1.396 m Patient Age:    46 years        BP:           156/74 mmHg Patient Gender: F               HR:           59 bpm. Exam Location:  Inpatient Procedure: 2D Echo Indications:    TIA 435.9 / G45.9  History:        Patient has no prior history of Echocardiogram examinations.                 Stroke; Risk Factors:Dyslipidemia and Hypertension.  Sonographer:    Vikki Ports Turrentine Referring Phys: [id number] JAN A [place]  1. Normal LV systolic function; grade 1 diastolic dysfunction.  2. Left ventricular ejection fraction, by estimation, is 60 to 65%. The left ventricle has normal function. The left ventricle has no regional wall motion abnormalities. Left ventricular diastolic parameters are consistent with Grade I diastolic dysfunction (impaired relaxation).  3. Right ventricular systolic function is normal. The right ventricular size is normal.  4. The mitral valve is normal in structure. Trivial mitral valve regurgitation. No evidence of mitral stenosis.  5. The aortic valve is tricuspid. Aortic valve regurgitation is not visualized. No aortic stenosis is present.  6. The inferior vena cava is normal  in size with greater than 50% respiratory variability, suggesting right atrial pressure of 3 mmHg. FINDINGS  Left Ventricle: Left ventricular ejection fraction, by estimation, is 60 to 65%. The left ventricle has normal function. The left ventricle has no regional wall motion abnormalities. The left ventricular internal cavity size was normal in size. There is  no left ventricular hypertrophy. Left ventricular diastolic parameters are consistent with Grade I diastolic dysfunction (impaired relaxation).  Right Ventricle: The right ventricular size is normal. Right ventricular systolic function is normal. Left Atrium: Left atrial size was normal in size. Right Atrium: Right atrial size was normal in size. Pericardium: There is no evidence of pericardial effusion. Mitral Valve: The mitral valve is normal in structure. Normal mobility of the mitral valve leaflets. Trivial mitral valve regurgitation. No evidence of mitral valve stenosis. Tricuspid Valve: The tricuspid valve is normal in structure. Tricuspid valve regurgitation is trivial. No evidence of tricuspid stenosis. Aortic Valve: The aortic valve is tricuspid. Aortic valve regurgitation is not visualized. No aortic stenosis is present. Aortic valve mean gradient measures 4.0 mmHg. Aortic valve peak gradient measures 8.0 mmHg. Aortic valve area, by VTI measures 2.11 cm. Pulmonic Valve: The pulmonic valve was normal in structure. Pulmonic valve regurgitation is not visualized. No evidence of pulmonic stenosis. Aorta: The aortic root is normal in size and structure. Venous: The inferior vena cava is normal in size with greater than 50% respiratory variability, suggesting right atrial pressure of 3 mmHg.  Additional Comments: Normal LV systolic function; grade 1 diastolic dysfunction.  LEFT VENTRICLE PLAX 2D LVIDd:         3.90 cm  Diastology LVIDs:         2.60 cm  LV e' lateral:   10.80 cm/s LV PW:         0.90 cm  LV E/e' lateral: 8.6 LV IVS:        0.90 cm  LV e'  medial:    7.07 cm/s LVOT diam:     1.90 cm  LV E/e' medial:  13.2 LV SV:         72 LV SV Index:   52 LVOT Area:     2.84 cm  RIGHT VENTRICLE RV S prime:     12.70 cm/s TAPSE (M-mode): 2.2 cm LEFT ATRIUM             Index LA diam:        2.80 cm 2.01 cm/m LA Vol (A2C):   35.4 ml 25.35 ml/m LA Vol (A4C):   38.9 ml 27.86 ml/m LA Biplane Vol: 39.7 ml 28.43 ml/m  AORTIC VALVE AV Area (Vmax):    2.27 cm AV Area (Vmean):   2.26 cm AV Area (VTI):     2.11 cm AV Vmax:           141.00 cm/s AV Vmean:          97.700 cm/s AV VTI:            0.342 m AV Peak Grad:      8.0 mmHg AV Mean Grad:      4.0 mmHg LVOT Vmax:         113.00 cm/s LVOT Vmean:        78.000 cm/s LVOT VTI:          0.255 m LVOT/AV VTI ratio: 0.75  AORTA Ao Root diam: 2.90 cm MITRAL VALVE MV Area (PHT): 2.66 cm    SHUNTS MV Decel Time: 285 msec    Systemic VTI:  0.26 m MV E velocity: 93.10 cm/s  Systemic Diam: 1.90 cm MV A velocity: 99.90 cm/s MV E/A ratio:  0.93 Kirk Ruths MD Electronically signed by Kirk Ruths MD Signature Date/Time: 05/05/2019/2:17:46 PM    Final         Scheduled Meds: . amLODipine  10 mg Oral Daily  . aspirin EC  81 mg Oral Daily  . clopidogrel  75 mg  Oral Daily  . enoxaparin (LOVENOX) injection  40 mg Subcutaneous Q24H  . escitalopram  20 mg Oral Daily  . feeding supplement (ENSURE ENLIVE)  237 mL Oral BID BM  . multivitamin with minerals  1 tablet Oral Daily  . nicotine  14 mg Transdermal Daily  . potassium chloride  40 mEq Oral Q4H  . simvastatin  20 mg Oral q1800   Continuous Infusions:   LOS: 2 days   Time spent= 25 mins    Candice Lunney Arsenio Loader, MD [facility]  If 7PM-7AM, please contact night-coverage  05/07/2019, 8:11 AM

## 2019-05-08 DIAGNOSIS — I63511 Cerebral infarction due to unspecified occlusion or stenosis of right middle cerebral artery: Secondary | ICD-10-CM

## 2019-05-08 LAB — CBC
HCT: 44.4 % (ref 36.0–46.0)
Hemoglobin: 14.1 g/dL (ref 12.0–15.0)
MCH: 28.5 pg (ref 26.0–34.0)
MCHC: 31.8 g/dL (ref 30.0–36.0)
MCV: 89.7 fL (ref 80.0–100.0)
Platelets: 307 10*3/uL (ref 150–400)
RBC: 4.95 MIL/uL (ref 3.87–5.11)
RDW: 13.5 % (ref 11.5–15.5)
WBC: 7.6 10*3/uL (ref 4.0–10.5)
nRBC: 0 % (ref 0.0–0.2)

## 2019-05-08 LAB — BASIC METABOLIC PANEL
Anion gap: 14 (ref 5–15)
BUN: 12 mg/dL (ref 8–23)
CO2: 22 mmol/L (ref 22–32)
Calcium: 9.4 mg/dL (ref 8.9–10.3)
Chloride: 104 mmol/L (ref 98–111)
Creatinine, Ser: 0.77 mg/dL (ref 0.44–1.00)
GFR calc Af Amer: >60 mL/min (ref >60)
GFR calc non Af Amer: >60 mL/min (ref >60)
Glucose, Bld: 104 mg/dL — ABNORMAL HIGH (ref 70–99)
Potassium: 4.2 mmol/L (ref 3.5–5.1)
Sodium: 140 mmol/L (ref 135–145)

## 2019-05-08 LAB — COMPREHENSIVE METABOLIC PANEL
ALT: 21 U/L (ref 0–44)
AST: 27 U/L (ref 15–41)
Albumin: 4 g/dL (ref 3.5–5.0)
Alkaline Phosphatase: 74 U/L (ref 38–126)
Anion gap: 14 (ref 5–15)
BUN: 13 mg/dL (ref 8–23)
CO2: 22 mmol/L (ref 22–32)
Calcium: 9.5 mg/dL (ref 8.9–10.3)
Chloride: 103 mmol/L (ref 98–111)
Creatinine, Ser: 0.87 mg/dL (ref 0.44–1.00)
GFR calc Af Amer: >60 mL/min (ref >60)
GFR calc non Af Amer: >60 mL/min (ref >60)
Glucose, Bld: 106 mg/dL — ABNORMAL HIGH (ref 70–99)
Potassium: 4.7 mmol/L (ref 3.5–5.1)
Sodium: 139 mmol/L (ref 135–145)
Total Bilirubin: 0.9 mg/dL (ref 0.3–1.2)
Total Protein: 7.3 g/dL (ref 6.5–8.1)

## 2019-05-08 LAB — MAGNESIUM: Magnesium: 1.8 mg/dL (ref 1.7–2.4)

## 2019-05-08 LAB — PHOSPHORUS: Phosphorus: 2.8 mg/dL (ref 2.5–4.6)

## 2019-05-08 MED ORDER — THIAMINE HCL 100 MG PO TABS
100.0000 mg | ORAL_TABLET | Freq: Every day | ORAL | Status: DC
  Administered 2019-05-08 – 2019-05-12 (×5): 100 mg via ORAL
  Filled 2019-05-08 (×5): qty 1

## 2019-05-08 MED ORDER — FOLIC ACID 1 MG PO TABS
1.0000 mg | ORAL_TABLET | Freq: Every day | ORAL | Status: DC
  Administered 2019-05-08 – 2019-05-12 (×5): 1 mg via ORAL
  Filled 2019-05-08 (×5): qty 1

## 2019-05-08 MED ORDER — LORAZEPAM 1 MG PO TABS: 1.0000 mg | ORAL_TABLET | ORAL | Status: AC | PRN

## 2019-05-08 MED ORDER — LORAZEPAM 2 MG/ML IJ SOLN: 1.0000 mg | INTRAMUSCULAR | Status: AC | PRN

## 2019-05-08 MED ORDER — THIAMINE HCL 100 MG/ML IJ SOLN
100.0000 mg | Freq: Every day | INTRAMUSCULAR | Status: DC
  Filled 2019-05-08: qty 2

## 2019-05-08 NOTE — Care Management Important Message (Signed)
Important Message  Patient Details  Name: Stacie [name] MRN: [id number] Date of Birth: 06-06-1955   Medicare Important Message Given:  Yes     Orbie Pyo 05/08/2019, 3:26 PM

## 2019-05-08 NOTE — Progress Notes (Signed)
Inpatient Rehab Admissions:  Inpatient Rehab Consult received.  I met with pt at the bedside for rehabilitation assessment and to discuss goals and expectations of an inpatient rehab admission.  Daughter, [name], was also present. Pt/daughter acknowledged understanding of CIR goals and expectations.  Both were agreeable to potential CIR admission pending insurance approval. I will initiate insurance authorization process for possible admit.  Signed: Gayland Curry, [place], [place] Admissions Coordinator [phone]

## 2019-05-08 NOTE — Plan of Care (Signed)
  Problem: Clinical Measurements: Goal: Will remain free from infection Outcome: Progressing   Problem: Clinical Measurements: Goal: Will remain free from infection Outcome: Progressing   Problem: Health Behavior/Discharge Planning: Goal: Ability to manage health-related needs will improve Outcome: Progressing   

## 2019-05-08 NOTE — PMR Pre-admission (Addendum)
PMR Admission Coordinator Pre-Admission Assessment  Patient: Stacie [name] is an 64 y.o., female MRN: [id number] DOB: [birth year] Height: 5' 4"  (162.6 cm) Weight:                Insurance Information HMO: yes    PPO:      PCP:     IPA:      80/20:     OTHER:  PRIMARY:[employer] MCR      Policy#: [id number]     Subscriber: patient CM Name: Wells Guiles through phone [phone] (confirmed again through Rhetta Mura with pre-service) (also confirmed with [facility] [place] At phone [phone] that current Josem Kaufmann is in-network)      Phone#: [phone]     Fax#: [phone] Pre-Cert#: original case #: [id number]; NEW case # for this case is: [id number] (confirmed with Wells Guiles and Rhetta Mura new case number is for this patient and for inpatient rehab)     Employer:  Benefits:  Phone #: online     Name: [email or web address] Eff. Date: 02/03/19-02/02/20     Deduct: $0     Out of Pocket Max: $3,600 ($0 met)      Life Max:  CIR: $295/day for days 1-5, $0/day for days 6+      SNF:$0 /day for days 1-20, $184/day for days 21-40, $0/day for days 41-100; limited to 100 days/calendar year Outpatient: $30/visit co-pay; limited by medical necessity     Co-Pay:  Home Health: 100% coverage, $0 co-insurance; limited by medical necessity      Co-Pay:  DME: 80% coverage, 20% co-insurance     Co-Pay:  Providers: SECONDARY:       Policy#:       Subscriber:  CM Name:       Phone#:      Fax#:  Pre-Cert#:       Employer:  Benefits:  Phone #:      Name:  Eff. Date:      Deduct:       Out of Pocket Max:       Life Max:  CIR:       SNF:  Outpatient:      Co-Pay:  Home Health:       Co-Pay:  DME:      Co-Pay:   Medicaid Application Date:       Case Manager:  Disability Application Date:       Case Worker:   The "Data Collection Information Summary" for patients in Inpatient Rehabilitation Facilities with attached "[place] Records" was provided and verbally reviewed with: Patient  Emergency Contact  Information Contact Information    Name Relation Home Work [place] Daughter [phone]  [phone]   Austin,Courtney Daughter [phone]  [phone]     Current Medical History  Patient Admitting Diagnosis: R punctate left caudate infarct  History of Present Illness: Stacie [name] is a 64 year old female with history of hypertension, hyperlipidemia, tobacco abuse as well as marijuana. Presented on 05/04/2019 with bilateral lower extremity weakness and difficulty with ambulation with facial droop and dysarthria.  Cranial CT scan age-indeterminate infarcts within the right frontal lobe white matter and right basal ganglia possibly acute or subacute.  MRI showed a 8 mm acute infarction posterior limb internal capsule on the right.  Punctate acute infarct either in the left caudate head or white matter adjacent to the frontal horn of the left lateral ventricle.  8 mm acute to subacute infarct in the white matter adjacent to the atrium of the  right lateral ventricle.  Patient did not receive TPA.  CT angiogram head and neck no emergent findings.  Echocardiogram with ejection fraction 65% without emboli.  Admission chemistries unremarkable except potassium 3.3, alcohol negative, urine drug screen positive marijuana, urinalysis greater than 70,000 ESBL.  Maintained on aspirin and Plavix for CVA prophylaxis x3 weeks then aspirin alone.  Subcutaneous Lovenox for DVT prophylaxis.  Therapy evaluations completed and patient is to be admitted for a comprehensive rehab program on 05/12/19.   Complete NIHSS TOTAL: 5 Glasgow Coma Scale Score: 15  Past Medical History  Past Medical History:  Diagnosis Date  . Colitis   . Hyperlipidemia   . Hypertension   . Stroke ([facility]) 04/2012   unknown type  . Thyroid disease     Family History  family history includes Heart disease in her maternal grandmother, paternal grandfather, and paternal uncle; Heart disease (age of onset: 70) in her father;  Heart disease (age of onset: 47) in her sister; Stroke (age of onset: 52) in her mother.  Prior Rehab/Hospitalizations:  Has the patient had prior rehab or hospitalizations prior to admission? No  Has the patient had major surgery during 100 days prior to admission? No  Current Medications   Current Facility-Administered Medications:  .  acetaminophen (TYLENOL) tablet 650 mg, 650 mg, Oral, Q4H PRN **OR** acetaminophen (TYLENOL) 160 MG/5ML solution 650 mg, 650 mg, Per Tube, Q4H PRN **OR** acetaminophen (TYLENOL) suppository 650 mg, 650 mg, Rectal, Q4H PRN, Mansy, Jan A, MD .  amLODipine (NORVASC) tablet 10 mg, 10 mg, Oral, Daily, Mansy, Jan A, MD, 10 mg at 05/08/19 0925 .  aspirin EC tablet 81 mg, 81 mg, Oral, Daily, Biby, Sharon L, NP, 81 mg at 05/08/19 0926 .  clopidogrel (PLAVIX) tablet 75 mg, 75 mg, Oral, Daily, Biby, Sharon L, NP, 75 mg at 05/08/19 0926 .  doxylamine (Sleep) (UNISOM) tablet 25 mg, 25 mg, Oral, QHS PRN, Mansy, Jan A, MD, 25 mg at 05/07/19 2149 .  enoxaparin (LOVENOX) injection 40 mg, 40 mg, Subcutaneous, Q24H, Mansy, Jan A, MD, 40 mg at 05/08/19 0931 .  escitalopram (LEXAPRO) tablet 20 mg, 20 mg, Oral, Daily, Mansy, Jan A, MD, 20 mg at 05/08/19 0926 .  feeding supplement (ENSURE ENLIVE) (ENSURE ENLIVE) liquid 237 mL, 237 mL, Oral, BID BM, Annita Brod, MD, 237 mL at 05/08/19 1312 .  folic acid (FOLVITE) tablet 1 mg, 1 mg, Oral, Daily, Amin, Ankit Chirag, MD, 1 mg at 05/08/19 0925 .  labetalol (NORMODYNE) injection 20 mg, 20 mg, Intravenous, Q3H PRN, Biby, Sharon L, NP .  LORazepam (ATIVAN) tablet 1-4 mg, 1-4 mg, Oral, Q1H PRN **OR** LORazepam (ATIVAN) injection 1-4 mg, 1-4 mg, Intravenous, Q1H PRN, Amin, Ankit Chirag, MD .  multivitamin with minerals tablet 1 tablet, 1 tablet, Oral, Daily, Annita Brod, MD, 1 tablet at 05/08/19 0925 .  nicotine (NICODERM CQ - dosed in mg/24 hours) patch 14 mg, 14 mg, Transdermal, Daily, Biby, Sharon L, NP, 14 mg at 05/08/19  0930 .  nystatin (MYCOSTATIN) 100000 UNIT/ML suspension 500,000 Units, 5 mL, Oral, TID, Amin, Ankit Chirag, MD, 500,000 Units at 05/07/19 2149 .  ondansetron (ZOFRAN) injection 4 mg, 4 mg, Intravenous, Q6H PRN, Amin, Ankit Chirag, MD, 4 mg at 05/08/19 0936 .  senna-docusate (Senokot-S) tablet 1 tablet, 1 tablet, Oral, QHS PRN, Mansy, Jan A, MD .  simvastatin (ZOCOR) tablet 20 mg, 20 mg, Oral, q1800, Mansy, Jan A, MD, 20 mg at 05/07/19 1700 .  thiamine tablet 100  mg, 100 mg, Oral, Daily, 100 mg at 05/08/19 0925 **OR** thiamine (B-1) injection 100 mg, 100 mg, Intravenous, Daily, Amin, Ankit Chirag, MD  Patients Current Diet:  Diet Order            DIET DYS 3 Room service appropriate? Yes; Fluid consistency: Thin  Diet effective now              Precautions / Restrictions Precautions Precautions: Fall Restrictions Weight Bearing Restrictions: No   Has the patient had 2 or more falls or a fall with injury in the past year?Yes  Prior Activity Level Limited Community (1-2x/wk): 2x/weel per pt report  Prior Functional Level Prior Function Level of Independence: Independent  Self Care: Did the patient need help bathing, dressing, using the toilet or eating?  Independent  Indoor Mobility: Did the patient need assistance with walking from room to room (with or without device)? Independent  Stairs: Did the patient need assistance with internal or external stairs (with or without device)? Independent  Functional Cognition: Did the patient need help planning regular tasks such as shopping or remembering to take medications? Independent  Home Assistive Devices / Equipment Home Equipment: None  Prior Device Use: Indicate devices/aids used by the patient prior to current illness, exacerbation or injury? None of the above  Current Functional Level Cognition  Arousal/Alertness: Awake/alert Overall Cognitive Status: Impaired/Different from baseline Orientation Level: Oriented  X4 Following Commands: Follows multi-step commands inconsistently, Follows one step commands with increased time Safety/Judgement: Decreased awareness of deficits, Decreased awareness of safety General Comments: patient with impulsivity and axiety with mobility due to unsteadiness and nausea.   Attention: Focused, Sustained Focused Attention: Impaired Focused Attention Impairment: Verbal complex Sustained Attention: Impaired Sustained Attention Impairment: Verbal complex Memory: Impaired Memory Impairment: Retrieval deficit, Storage deficit, Decreased recall of new information(Immediate: 5/5; delayed: 4/5) Awareness: Impaired Awareness Impairment: Emergent impairment Problem Solving: Impaired Problem Solving Impairment: Verbal complex Executive Function: Reasoning Reasoning: Impaired Reasoning Impairment: Verbal complex    Extremity Assessment (includes Sensation/Coordination)  Upper Extremity Assessment: LUE deficits/detail LUE Deficits / Details: holding UE internally rotated and decrease attention to L UE. pt with decrease grasp and fine motor. pt able to place hand around cup but unable to sustain,. AROM shoulder flexion 80 degrees unable to sustain LUE Sensation: decreased light touch LUE Coordination: decreased fine motor, decreased gross motor  Lower Extremity Assessment: LLE deficits/detail LLE Deficits / Details: able to perform limited SLR but not hold against resistance. deferred formal MMT as pt distracted by nausea    ADLs  Overall ADL's : Needs assistance/impaired Eating/Feeding: Moderate assistance, Bed level Eating/Feeding Details (indicate cue type and reason): needed encouragement to visually attend to cup to scoop out ice chips. pt motivated to eat ice so engaged in task.  Grooming: Wash/dry face, Minimal assistance, Bed level Grooming Details (indicate cue type and reason): pt pulling cool wash cloth off face and putting it back in a restless manner Lower Body  Dressing Details (indicate cue type and reason): pt declines to don due to urgency and states "no you do it" Toilet Transfer: +2 for physical assistance, Moderate assistance, Ambulation, BSC Toileting- Clothing Manipulation and Hygiene: Moderate assistance, Sitting/lateral lean Functional mobility during ADLs: +2 for physical assistance, Moderate assistance General ADL Comments: pt very restless and overall impulsive at this time. pt needs cues to remain seated and not to pull at leads/ therapists    Mobility  Overal bed mobility: Needs Assistance Bed Mobility: Supine to Sit Rolling: Min assist  Supine to sit: Min guard Sit to supine: Min assist General bed mobility comments: min guard for safety    Transfers  Overall transfer level: Needs assistance Equipment used: 1 person hand held assist Transfers: Sit to/from Stand Sit to Stand: Min assist, Max assist General transfer comment: min A to stand X 2 trials and max A required due to L lateral LOB when standing from recliner; cues for safety     Ambulation / Gait / Stairs / Wheelchair Mobility  Ambulation/Gait Ambulation/Gait assistance: Mod assist, +2 safety/equipment Gait Distance (Feet): (75 ft seated break then 75 ft ) Assistive device: 1 person hand held assist(assist at trunk with gait belt) Gait Pattern/deviations: Step-through pattern, Decreased stride length, Decreased stance time - left, Drifts right/left, Narrow base of support General Gait Details: at least single UE support required for balance and proprioception; cues for forward gaze as pt has R gaze preference(+2 for line management) Gait velocity: decreased Gait velocity interpretation: <1.31 ft/sec, indicative of household ambulator    Posture / Balance Balance Overall balance assessment: Needs assistance Sitting-balance support: Bilateral upper extremity supported, Feet supported Sitting balance-Leahy Scale: Fair Standing balance support: Bilateral upper extremity  supported, Single extremity supported, During functional activity Standing balance-Leahy Scale: Poor    Special needs/care consideration BiPAP/CPAP no CPM no Continuous Drip IV no Dialysis no        Days no Life Vest no Oxygen no Special Bed no Trach Size no Wound Vac (area) no       Location no Skin: ecchymosis arm (left, right), skin tear arm (left, right); pressure injury 05/04/19 buttocks right bruise                          Bowel mgmt: Last BM 05/05/19 Bladder mgmt:no Diabetic mgmt no Behavioral consideration  no Chemo/radiation no     Previous Home Environment (from acute therapy documentation) Living Arrangements: Other (Comment)(lived with ex-husband)  Lives With: Other (Comment)(ex-husband) Available Help at Discharge: Family, Available 24 hours/day Type of Home: House Home Layout: 2 levels. Does not go to basement. Able to live on main level with bedroom/bathroom Home Access: Stairs to enter Entrance Stairs-Rails: Can reach both Entrance Stairs-Number of Steps: 3 Bathroom Shower/Tub: Chiropodist: Standard Bathroom accessibility:  Side stepping with a walker Additional Comments: x2 daughters present reports ex spouse has 3 cats in the house but she does not care for animals   Discharge Living Setting Plans for Discharge Living Setting: House, Other (Comment)(ex-husband) Type of Home at Discharge: House Discharge Home Layout: 2 levels.  Does not go to basement. Able to live on main level with bedroom/bathroom Discharge Home Access: Stairs to enter Entrance Stairs-Rails: Can reach both Entrance Stairs-Number of Steps: 3 Discharge Bathroom Shower/Tub: Tub/shower unit Discharge Bathroom Toilet: Standard Discharge Bathroom accessibility:  Side stepping with a walker Social/Family/Support Systems Patient Roles: Other (Comment)(lived with ex-husband, supportive daughter, on disability) Anticipated Caregiver: ex-husband with supportive  daughter Anticipated Caregiver's Contact Information: [name] Rennert, [phone] Caregiver Availability: 24/7   Goals/Additional Needs Patient/Family Goal for Rehab: supervision-Min A PT/OT Expected length of stay: 7-10 days Pt/Family Agrees to Admission and willing to participate: Yes Program Orientation Provided & Reviewed with Pt/Caregiver Including Roles  & Responsibilities: Yes   Decrease burden of Care through IP rehab admission: NA   Possible need for SNF placement upon discharge:not anticipated   Patient Condition: This patient's medical and functional status has changed since the consult  dated: 05/08/19 in which the Rehabilitation Physician determined and documented that the patient's condition is appropriate for intensive rehabilitative care in an inpatient rehabilitation facility. See "History of Present Illness" (above) for medical update. Functional changes are: improvement in bed mobility from Min/Mod A to Independent, improvement in transfers from Min A +2 to Min/Mod A, and improvement in ambulation from Mod A +2 15 feet to Min/Mod A for 3 bouts of ~ 100 ft). Patient's medical and functional status update has been discussed with the Rehabilitation physician and patient remains appropriate for inpatient rehabilitation. Will admit to inpatient rehab today.  Preadmission Screen Completed By:  Bethel Born, CCC-SLP, 05/08/2019 3:49 PM; Day of admit updates completed by Raechel Ache, OTR/L on 05/12/19 at 11:27AM.  ______________________________________________________________________   Discussed status with Dr. Naaman Plummer on 05/12/19 at 9:33AM and received approval for admission today.  Admission Coordinator:  Bethel Born, time9:33AM/Date 05/12/19   Day of admit updates completed by Raechel Ache, OTR/L on 05/12/19 at 11:27AM.

## 2019-05-08 NOTE — Progress Notes (Signed)
PROGRESS NOTE    Stacie [name]  [id number] DOB: [birth year] DOA: 05/04/2019 PCP: [name], NP   Brief Narrative:  64 year old with history of HTN, polysubstance abuse, previous CVA came to the with acute onset of left-sided facial droop, dysarthria.  Initially she thought the symptoms would go away on its own therefore did not seek medical attention but eventually ended up coming to the ER on 4/1.  CT of the head showed age-indeterminate infarct.  MRI brain showed multifocal infarct.  Neurology team was consulted.  Recommended aspirin and Plavix for 3 weeks.  Assessment & Plan:   Principal Problem:   CVA (cerebral vascular accident) ([place]) Active Problems:   Essential hypertension   Hyperlipidemia   PAD (peripheral artery disease) ([facility])   Underweight   Depression   Tobacco abuse   Marijuana use   Alcohol abuse   Chronic diastolic CHF (congestive heart failure) ([facility])  Acute multifocal white matter CVA -CT of the head showed indeterminate infarct, MRI brain confirmed multifocal infarction.  No large vessel occlusion seen on CTA of the head neck -Echocardiogram-EF 60-65%. -LDL 125, A1c 5.5 -Recommending aspirin 81 mg, Plavix 75 mg daily for 3 weeks followed by aspirin alone. -PT-CIR.  Pending placement.  Hypokalemia -Resolved  Nonspecific nausea -Resolved.  Abdominal x-rays negative.  Lipase 28.  Essential hypertension -Slight permissive hypertension, normalize over the next week  Hyperlipidemia -LDL 125.  Lipitor 40 mg daily  Peripheral arterial disease -Daily aspirin and statin  Chronic diastolic congestive heart failure -Patient appears to be euvolemic.  Echocardiogram showed EF of 60-65%  Severe protein calorie malnutrition -Seen by dietitian.  Supplements have been ordered.  Polysubstance abuse-alcohol, marijuana Tobacco use -Counseled to quit using this   DVT prophylaxis: Lovenox Code Status: Full code Family Communication: Patient's  ex-husband at bedside Disposition Plan:   Patient From= Home   Patient Anticipated D/C place= CIR  Barriers= Awaiting CIR authorization.   Subjective: No complaints speech is slightly better.  Ex-husband at bedside.   Review of Systems Otherwise negative except as per HPI, including: General: Denies fever, chills, night sweats or unintended weight loss. Resp: Denies cough, wheezing, shortness of breath. Cardiac: Denies chest pain, palpitations, orthopnea, paroxysmal nocturnal dyspnea. GI: Denies abdominal pain, nausea, vomiting, diarrhea or constipation GU: Denies dysuria, frequency, hesitancy or incontinence MS: Denies muscle aches, joint pain or swelling Neuro: Denies headache, neurologic deficits (focal weakness, numbness, tingling), abnormal gait Psych: Denies anxiety, depression, SI/HI/AVH Skin: Denies new rashes or lesions ID: Denies sick contacts, exotic exposures, travel  Examination: Constitutional: Not in acute distress, chronically ill-appearing.  Bilateral temporal wasting. Respiratory: Clear to auscultation bilaterally Cardiovascular: Normal sinus rhythm, no rubs Abdomen: Nontender nondistended good bowel sounds Musculoskeletal: No edema noted Skin: No rashes seen Neurologic: CN 2-12 grossly intact.  Mild dysarthria but improved compared to yesterday Psychiatric: Normal judgment and insight. Alert and oriented x 3. Normal mood.  Objective: Vitals:   05/08/19 0330 05/08/19 0333 05/08/19 0852 05/08/19 1132  BP: (!) 187/93 (!) 178/82 (!) 158/94 (!) 162/87  Pulse: 73 70 79 74  Resp: 18 18 18 18   Temp: 98.3 F (36.8 C) 97.9 F (36.6 C) 97.7 F (36.5 C) 98.4 F (36.9 C)  TempSrc: Oral Oral Oral Oral  SpO2: 95% 95% 99% 97%  Height:        Intake/Output Summary (Last 24 hours) at 05/08/2019 1230 Last data filed at 05/08/2019 0800 Gross per 24 hour  Intake 24 ml  Output --  Net 24 ml  There were no vitals filed for this visit.   Data Reviewed:    CBC: Recent Labs  Lab 05/04/19 1536 05/08/19 0847  WBC 10.0 7.6  NEUTROABS 8.0*  --   HGB 14.0 14.1  HCT 44.8 44.4  MCV 90.1 89.7  PLT 310 [id number]   Basic Metabolic Panel: Recent Labs  Lab 05/04/19 1536 05/07/19 0408 05/07/19 1531 05/08/19 0336 05/08/19 0847  NA 139 139 141 140 139  K 3.3* 2.7* 4.0 4.2 4.7  CL 103 99 104 104 103  CO2 22 23 22 22 22   GLUCOSE 109* 70 102* 104* 106*  BUN 16 6* 11 12 13   CREATININE 0.81 0.79 0.96 0.77 0.87  CALCIUM 9.1 9.0 9.6 9.4 9.5  MG  --  1.8  --  1.8  --   PHOS  --  3.5  --  2.8  --    GFR: CrCl cannot be calculated (Unknown ideal weight.). Liver Function Tests: Recent Labs  Lab 05/04/19 1536 05/08/19 0847  AST 25 27  ALT 16 21  ALKPHOS 81 74  BILITOT 1.0 0.9  PROT 6.9 7.3  ALBUMIN 4.1 4.0   Recent Labs  Lab 05/04/19 1536 05/07/19 1531  LIPASE 26 28   No results for input(s): AMMONIA in the last 168 hours. Coagulation Profile: No results for input(s): INR, PROTIME in the last 168 hours. Cardiac Enzymes: No results for input(s): CKTOTAL, CKMB, CKMBINDEX, TROPONINI in the last 168 hours. BNP (last 3 results) No results for input(s): PROBNP in the last 8760 hours. HbA1C: No results for input(s): HGBA1C in the last 72 hours. CBG: No results for input(s): GLUCAP in the last 168 hours. Lipid Profile: No results for input(s): CHOL, HDL, LDLCALC, TRIG, CHOLHDL, LDLDIRECT in the last 72 hours. Thyroid Function Tests: No results for input(s): TSH, T4TOTAL, FREET4, T3FREE, THYROIDAB in the last 72 hours. Anemia Panel: No results for input(s): VITAMINB12, FOLATE, FERRITIN, TIBC, IRON, RETICCTPCT in the last 72 hours. Sepsis Labs: Recent Labs  Lab 05/04/19 1600  LATICACIDVEN 0.9    Recent Results (from the past 240 hour(s))  Urine culture     Status: Abnormal   Collection Time: 05/04/19  5:22 PM   Specimen: Urine, Random  Result Value Ref Range Status   Specimen Description URINE, RANDOM  Final   Special Requests    Final    NONE Performed at [facility], [address], [place], [place] [address]    Culture (A)  Final    70,000 COLONIES/mL ESCHERICHIA COLI Confirmed Extended Spectrum Beta-Lactamase Producer (ESBL).  In bloodstream infections from ESBL organisms, carbapenems are preferred over piperacillin/tazobactam. They are shown to have a lower risk of mortality.    Report Status 05/06/2019 FINAL  Final   Organism ID, Bacteria ESCHERICHIA COLI (A)  Final      Susceptibility   Escherichia coli - MIC*    AMPICILLIN >=32 RESISTANT Resistant     CEFAZOLIN >=64 RESISTANT Resistant     CEFTRIAXONE >=64 RESISTANT Resistant     CIPROFLOXACIN <=0.25 SENSITIVE Sensitive     GENTAMICIN <=1 SENSITIVE Sensitive     IMIPENEM <=0.25 SENSITIVE Sensitive     NITROFURANTOIN <=16 SENSITIVE Sensitive     TRIMETH/SULFA <=20 SENSITIVE Sensitive     AMPICILLIN/SULBACTAM >=32 RESISTANT Resistant     PIP/TAZO <=4 SENSITIVE Sensitive     * 70,000 COLONIES/mL ESCHERICHIA COLI  SARS CORONAVIRUS 2 (TAT 6-24 HRS) Nasopharyngeal Nasopharyngeal Swab     Status: None   Collection Time:  05/04/19 10:46 PM   Specimen: Nasopharyngeal Swab  Result Value Ref Range Status   SARS Coronavirus 2 NEGATIVE NEGATIVE Final    Comment: (NOTE) SARS-CoV-2 target nucleic acids are NOT DETECTED. The SARS-CoV-2 RNA is generally detectable in upper and lower respiratory specimens during the acute phase of infection. Negative results do not preclude SARS-CoV-2 infection, do not rule out co-infections with other pathogens, and should not be used as the sole basis for treatment or other patient management decisions. Negative results must be combined with clinical observations, patient history, and epidemiological information. The expected result is Negative. Fact Sheet for Patients: SugarRoll.be Fact Sheet for Healthcare Providers: https://www.woods-mathews.com/ This test is not yet approved  or cleared by the [place] FDA and  has been authorized for detection and/or diagnosis of SARS-CoV-2 by FDA under an Emergency Use Authorization (EUA). This EUA will remain  in effect (meaning this test can be used) for the duration of the COVID-19 declaration under Section 56 4(b)(1) of the Act, 21 U.S.C. section 360bbb-3(b)(1), unless the authorization is terminated or revoked sooner. Performed at [facility], [place] [address], [place], [place] [address]          Radiology Studies: DG Abd 1 View  Result Date: 05/07/2019 CLINICAL DATA:  Abdominal distension EXAM: ABDOMEN - 1 VIEW COMPARISON:  CT abdomen dated 08/18/2018. FINDINGS: No dilated large or small bowel loops are appreciated. No evidence of renal or ureteral calculi. No evidence of free air under either diaphragm. No acute or suspicious osseous finding. IMPRESSION: Nonobstructive bowel gas pattern. Electronically Signed   By: Franki Cabot M.D.   On: 05/07/2019 12:48        Scheduled Meds: . amLODipine  10 mg Oral Daily  . aspirin EC  81 mg Oral Daily  . clopidogrel  75 mg Oral Daily  . enoxaparin (LOVENOX) injection  40 mg Subcutaneous Q24H  . escitalopram  20 mg Oral Daily  . feeding supplement (ENSURE ENLIVE)  237 mL Oral BID BM  . folic acid  1 mg Oral Daily  . multivitamin with minerals  1 tablet Oral Daily  . nicotine  14 mg Transdermal Daily  . nystatin  5 mL Oral TID  . simvastatin  20 mg Oral q1800  . thiamine  100 mg Oral Daily   Or  . thiamine  100 mg Intravenous Daily   Continuous Infusions:   LOS: 3 days   Time spent= 15 mins    Vada Swift Arsenio Loader, MD [facility]  If 7PM-7AM, please contact night-coverage  05/08/2019, 12:30 PM

## 2019-05-08 NOTE — Progress Notes (Signed)
Physical Therapy Treatment Patient Details Name: Stacie [name] MRN: [id number] DOB: 1955/03/27 Today's Date: 05/08/2019    History of Present Illness 64 yo female admitted with reports of BIL LE weakness, BP 201/90, hypokalemia CT parenchymal atrophy and chronice small vess ischemica disease. MRI (+) 8 mm acte infrart posterior limb of internal capsule of the R with puncata acute infrarction in the L caudate head or white matter adjacent to the frontal horn of L lateral ventricle, 8 mm acute to subacute infarct adjacent to R lareal ventricle and old small vessel infarct R basal ganglia  PMH HTN tobacco abuse, marijuana abuse, ETOH abuse Depression occasional urinary incontinence,     PT Comments    Patient is making progress toward PT goals. Pt tolerated increased gait distance with assistance for balance and proprioception. Pt with L lateral bias and R gaze preference. C/o blurred vision, dizziness, and nausea throughout session. Continue to recommend CIR for further skilled PT services to maximize independence and safety with mobility.     Follow Up Recommendations  CIR;Supervision/Assistance - 24 hour     Equipment Recommendations  Other (comment)(TBA)    Recommendations for Other Services Rehab consult     Precautions / Restrictions Precautions Precautions: Fall Restrictions Weight Bearing Restrictions: No    Mobility  Bed Mobility Overal bed mobility: Needs Assistance Bed Mobility: Supine to Sit     Supine to sit: Min guard     General bed mobility comments: min guard for safety  Transfers Overall transfer level: Needs assistance Equipment used: 1 person hand held assist Transfers: Sit to/from Stand Sit to Stand: Min assist;Max assist         General transfer comment: min A to stand X 2 trials and max A required due to L lateral LOB when standing from recliner; cues for safety   Ambulation/Gait Ambulation/Gait assistance: Mod assist;+2 safety/equipment Gait  Distance (Feet): (75 ft seated break then 75 ft ) Assistive device: 1 person hand held assist(assist at trunk with gait belt) Gait Pattern/deviations: Step-through pattern;Decreased stride length;Decreased stance time - left;Drifts right/left;Narrow base of support Gait velocity: decreased   General Gait Details: at least single UE support required for balance and proprioception; cues for forward gaze as pt has R gaze preference(+2 for line management)   Stairs             Wheelchair Mobility    Modified Rankin (Stroke Patients Only) Modified Rankin (Stroke Patients Only) Pre-Morbid Rankin Score: No symptoms Modified Rankin: Moderately severe disability     Balance Overall balance assessment: Needs assistance Sitting-balance support: Bilateral upper extremity supported;Feet supported Sitting balance-Leahy Scale: Fair     Standing balance support: Bilateral upper extremity supported;Single extremity supported;During functional activity Standing balance-Leahy Scale: Poor                              Cognition Arousal/Alertness: Awake/alert Behavior During Therapy: Impulsive;Anxious;Restless Overall Cognitive Status: Impaired/Different from baseline Area of Impairment: Awareness;Safety/judgement;Following commands;Problem solving                       Following Commands: Follows multi-step commands inconsistently;Follows one step commands with increased time Safety/Judgement: Decreased awareness of deficits;Decreased awareness of safety Awareness: Emergent Problem Solving: Slow processing;Difficulty sequencing        Exercises      General Comments General comments (skin integrity, edema, etc.): pt with c/o dizziness and nausea throughout session  Pertinent Vitals/Pain Pain Assessment: No/denies pain    Home Living                      Prior Function            PT Goals (current goals can now be found in the care plan  section) Progress towards PT goals: Progressing toward goals    Frequency    Min 4X/week      PT Plan Current plan remains appropriate    Co-evaluation              AM-PAC PT "6 Clicks" Mobility   Outcome Measure  Help needed turning from your back to your side while in a flat bed without using bedrails?: None Help needed moving from lying on your back to sitting on the side of a flat bed without using bedrails?: A Little Help needed moving to and from a bed to a chair (including a wheelchair)?: A Lot Help needed standing up from a chair using your arms (e.g., wheelchair or bedside chair)?: A Lot Help needed to walk in hospital room?: A Lot Help needed climbing 3-5 steps with a railing? : A Lot 6 Click Score: 15    End of Session Equipment Utilized During Treatment: Gait belt Activity Tolerance: Patient tolerated treatment well Patient left: with call bell/phone within reach;in chair;with chair alarm set Nurse Communication: Mobility status PT Visit Diagnosis: Unsteadiness on feet (R26.81);Other abnormalities of gait and mobility (R26.89);History of falling (Z91.81);Difficulty in walking, not elsewhere classified (R26.2)     Time: [id number] PT Time Calculation (min) (ACUTE ONLY): 27 min  Charges:  $Gait Training: 23-37 mins                     Earney Navy, PTA Acute Rehabilitation Services Pager: [phone] Office: [phone]     Darliss Cheney 05/08/2019, 9:22 AM

## 2019-05-09 DIAGNOSIS — I739 Peripheral vascular disease, unspecified: Secondary | ICD-10-CM

## 2019-05-09 LAB — BASIC METABOLIC PANEL
Anion gap: 12 (ref 5–15)
BUN: 13 mg/dL (ref 8–23)
CO2: 25 mmol/L (ref 22–32)
Calcium: 9.3 mg/dL (ref 8.9–10.3)
Chloride: 104 mmol/L (ref 98–111)
Creatinine, Ser: 0.67 mg/dL (ref 0.44–1.00)
GFR calc Af Amer: >60 mL/min (ref >60)
GFR calc non Af Amer: >60 mL/min (ref >60)
Glucose, Bld: 99 mg/dL (ref 70–99)
Potassium: 3.8 mmol/L (ref 3.5–5.1)
Sodium: 141 mmol/L (ref 135–145)

## 2019-05-09 LAB — MAGNESIUM: Magnesium: 1.7 mg/dL (ref 1.7–2.4)

## 2019-05-09 LAB — PHOSPHORUS: Phosphorus: 3.5 mg/dL (ref 2.5–4.6)

## 2019-05-09 NOTE — Discharge Summary (Signed)
PROGRESS NOTE    Stacie [name]  [id number] DOB: 11-23-1955 DOA: 05/04/2019 PCP: [name], NP   Brief Narrative:  64 year old with history of HTN, polysubstance abuse, previous CVA came to the with acute onset of left-sided facial droop, dysarthria.  Initially she thought the symptoms would go away on its own therefore did not seek medical attention but eventually ended up coming to the ER on 4/1.  CT of the head showed age-indeterminate infarct.  MRI brain showed multifocal infarct.  Neurology team was consulted.  Recommended aspirin and Plavix for 3 weeks.  PT recommended CIR, ongoing placement  Assessment & Plan:   Principal Problem:   CVA (cerebral vascular accident) ([place]) Active Problems:   Essential hypertension   Hyperlipidemia   PAD (peripheral artery disease) ([facility])   Underweight   Depression   Tobacco abuse   Marijuana use   Alcohol abuse   Chronic diastolic CHF (congestive heart failure) ([facility])  Acute multifocal white matter CVA -CT of the head showed indeterminate infarct, MRI brain confirmed multifocal infarction.  No large vessel occlusion seen on CTA of the head neck -Echocardiogram-EF 60-65%. -LDL 125, A1c 5.5 -Recommending aspirin 81 mg, Plavix 75 mg daily for 3 weeks followed by aspirin alone. -PT-CIR.  Pending placement.  Hypokalemia -Resolved  Nonspecific nausea -Resolved.  Abdominal x-rays negative.  Lipase 28.  Essential hypertension -Slight permissive hypertension, normalize over the next week  Hyperlipidemia -LDL 125.  Lipitor 40 mg daily  Peripheral arterial disease -Daily aspirin and statin  Chronic diastolic congestive heart failure -Patient appears to be euvolemic.  Echocardiogram showed EF of 60-65%  Severe protein calorie malnutrition -Seen by dietitian.  Supplements have been ordered.  Polysubstance abuse-alcohol, marijuana Tobacco use -Counseled to quit using this   DVT prophylaxis: Lovenox Code Status: Full  code Family Communication: None at bedside Disposition Plan:   Patient From= Home   Patient Anticipated D/C place= CIR  Barriers= Awaiting CIR authorization.   Subjective: Resting comfortably no complaints.  Speech appears to be slightly better to me compared to yesterday  Review of Systems Otherwise negative except as per HPI, including: General: Denies fever, chills, night sweats or unintended weight loss. Resp: Denies cough, wheezing, shortness of breath. Cardiac: Denies chest pain, palpitations, orthopnea, paroxysmal nocturnal dyspnea. GI: Denies abdominal pain, nausea, vomiting, diarrhea or constipation GU: Denies dysuria, frequency, hesitancy or incontinence MS: Denies muscle aches, joint pain or swelling Neuro: Denies headache, neurologic deficits (focal weakness, numbness, tingling), abnormal gait Psych: Denies anxiety, depression, SI/HI/AVH Skin: Denies new rashes or lesions ID: Denies sick contacts, exotic exposures, travel  Examination: Constitutional: Not in acute distress Respiratory: Clear to auscultation bilaterally Cardiovascular: Normal sinus rhythm, no rubs Abdomen: Nontender nondistended good bowel sounds Musculoskeletal: No edema noted Skin: No rashes seen Neurologic: Mild dysarthric speech Psychiatric: Normal judgment and insight. Alert and oriented x 3. Normal mood.  Objective: Vitals:   05/08/19 1627 05/08/19 2000 05/09/19 0000 05/09/19 0854  BP: (!) 176/89 (!) 178/90 (!) 160/92 (!) 168/90  Pulse: 66 62 64 71  Resp: 18   18  Temp: 98.1 F (36.7 C) 98.2 F (36.8 C) 98.1 F (36.7 C) 98 F (36.7 C)  TempSrc: Oral Oral Oral Oral  SpO2: 99% 99% 98% 99%  Height:        Intake/Output Summary (Last 24 hours) at 05/09/2019 1315 Last data filed at 05/08/2019 1340 Gross per 24 hour  Intake 360 ml  Output --  Net 360 ml   There were no vitals  filed for this visit.   Data Reviewed:   CBC: Recent Labs  Lab 05/04/19 1536 05/08/19 0847  WBC 10.0  7.6  NEUTROABS 8.0*  --   HGB 14.0 14.1  HCT 44.8 44.4  MCV 90.1 89.7  PLT 310 [id number]   Basic Metabolic Panel: Recent Labs  Lab 05/07/19 0408 05/07/19 1531 05/08/19 0336 05/08/19 0847 05/09/19 0227  NA 139 141 140 139 141  K 2.7* 4.0 4.2 4.7 3.8  CL 99 104 104 103 104  CO2 23 22 22 22 25   GLUCOSE 70 102* 104* 106* 99  BUN 6* 11 12 13 13   CREATININE 0.79 0.96 0.77 0.87 0.67  CALCIUM 9.0 9.6 9.4 9.5 9.3  MG 1.8  --  1.8  --  1.7  PHOS 3.5  --  2.8  --  3.5   GFR: CrCl cannot be calculated (Unknown ideal weight.). Liver Function Tests: Recent Labs  Lab 05/04/19 1536 05/08/19 0847  AST 25 27  ALT 16 21  ALKPHOS 81 74  BILITOT 1.0 0.9  PROT 6.9 7.3  ALBUMIN 4.1 4.0   Recent Labs  Lab 05/04/19 1536 05/07/19 1531  LIPASE 26 28   No results for input(s): AMMONIA in the last 168 hours. Coagulation Profile: No results for input(s): INR, PROTIME in the last 168 hours. Cardiac Enzymes: No results for input(s): CKTOTAL, CKMB, CKMBINDEX, TROPONINI in the last 168 hours. BNP (last 3 results) No results for input(s): PROBNP in the last 8760 hours. HbA1C: No results for input(s): HGBA1C in the last 72 hours. CBG: No results for input(s): GLUCAP in the last 168 hours. Lipid Profile: No results for input(s): CHOL, HDL, LDLCALC, TRIG, CHOLHDL, LDLDIRECT in the last 72 hours. Thyroid Function Tests: No results for input(s): TSH, T4TOTAL, FREET4, T3FREE, THYROIDAB in the last 72 hours. Anemia Panel: No results for input(s): VITAMINB12, FOLATE, FERRITIN, TIBC, IRON, RETICCTPCT in the last 72 hours. Sepsis Labs: Recent Labs  Lab 05/04/19 1600  LATICACIDVEN 0.9    Recent Results (from the past 240 hour(s))  Urine culture     Status: Abnormal   Collection Time: 05/04/19  5:22 PM   Specimen: Urine, Random  Result Value Ref Range Status   Specimen Description URINE, RANDOM  Final   Special Requests   Final    NONE Performed at [facility], [address],  [place], [place] [address]    Culture (A)  Final    70,000 COLONIES/mL ESCHERICHIA COLI Confirmed Extended Spectrum Beta-Lactamase Producer (ESBL).  In bloodstream infections from ESBL organisms, carbapenems are preferred over piperacillin/tazobactam. They are shown to have a lower risk of mortality.    Report Status 05/06/2019 FINAL  Final   Organism ID, Bacteria ESCHERICHIA COLI (A)  Final      Susceptibility   Escherichia coli - MIC*    AMPICILLIN >=32 RESISTANT Resistant     CEFAZOLIN >=64 RESISTANT Resistant     CEFTRIAXONE >=64 RESISTANT Resistant     CIPROFLOXACIN <=0.25 SENSITIVE Sensitive     GENTAMICIN <=1 SENSITIVE Sensitive     IMIPENEM <=0.25 SENSITIVE Sensitive     NITROFURANTOIN <=16 SENSITIVE Sensitive     TRIMETH/SULFA <=20 SENSITIVE Sensitive     AMPICILLIN/SULBACTAM >=32 RESISTANT Resistant     PIP/TAZO <=4 SENSITIVE Sensitive     * 70,000 COLONIES/mL ESCHERICHIA COLI  SARS CORONAVIRUS 2 (TAT 6-24 HRS) Nasopharyngeal Nasopharyngeal Swab     Status: None   Collection Time: 05/04/19 10:46 PM   Specimen: Nasopharyngeal Swab  Result Value Ref Range Status   SARS Coronavirus 2 NEGATIVE NEGATIVE Final    Comment: (NOTE) SARS-CoV-2 target nucleic acids are NOT DETECTED. The SARS-CoV-2 RNA is generally detectable in upper and lower respiratory specimens during the acute phase of infection. Negative results do not preclude SARS-CoV-2 infection, do not rule out co-infections with other pathogens, and should not be used as the sole basis for treatment or other patient management decisions. Negative results must be combined with clinical observations, patient history, and epidemiological information. The expected result is Negative. Fact Sheet for Patients: SugarRoll.be Fact Sheet for Healthcare Providers: https://www.woods-mathews.com/ This test is not yet approved or cleared by the [place] FDA and  has been authorized for  detection and/or diagnosis of SARS-CoV-2 by FDA under an Emergency Use Authorization (EUA). This EUA will remain  in effect (meaning this test can be used) for the duration of the COVID-19 declaration under Section 56 4(b)(1) of the Act, 21 U.S.C. section 360bbb-3(b)(1), unless the authorization is terminated or revoked sooner. Performed at [facility], [place] [address], [place], [place] [address]          Radiology Studies: No results found.      Scheduled Meds: . amLODipine  10 mg Oral Daily  . aspirin EC  81 mg Oral Daily  . clopidogrel  75 mg Oral Daily  . enoxaparin (LOVENOX) injection  40 mg Subcutaneous Q24H  . escitalopram  20 mg Oral Daily  . feeding supplement (ENSURE ENLIVE)  237 mL Oral BID BM  . folic acid  1 mg Oral Daily  . multivitamin with minerals  1 tablet Oral Daily  . nicotine  14 mg Transdermal Daily  . nystatin  5 mL Oral TID  . simvastatin  20 mg Oral q1800  . thiamine  100 mg Oral Daily   Or  . thiamine  100 mg Intravenous Daily   Continuous Infusions:   LOS: 4 days   Time spent= 15 mins    Fabricio Endsley Arsenio Loader, MD [facility]  If 7PM-7AM, please contact night-coverage  05/09/2019, 1:15 PM

## 2019-05-09 NOTE — Progress Notes (Signed)
Physical Therapy Treatment Patient Details Name: Stacie [name] MRN: [id number] DOB: 1955-08-01 Today's Date: 05/09/2019    History of Present Illness 64 yo female admitted with reports of BIL LE weakness, BP 201/90, hypokalemia CT parenchymal atrophy and chronice small vess ischemica disease. 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 Pt reports decreased dizziness and nausea today and tolerated session well. Continue to recommend CIR for further skilled PT services for decreased risk of falls and caregiver burden.   Follow Up Recommendations  CIR;Supervision/Assistance - 24 hour     Equipment Recommendations  Other (comment)(TBA)    Recommendations for Other Services Rehab consult     Precautions / Restrictions Precautions Precautions: Fall Restrictions Weight Bearing Restrictions: No    Mobility  Bed Mobility Overal bed mobility: Needs Assistance Bed Mobility: Supine to Sit     Supine to sit: Min guard     General bed mobility comments: min guard for safety  Transfers Overall transfer level: Needs assistance Equipment used: 1 person hand held assist Transfers: Sit to/from Stand Sit to Stand: Min assist         General transfer comment: assist to power up into standing and then to steady upon standing   Ambulation/Gait Ambulation/Gait assistance: Mod assist Gait Distance (Feet): 200 Feet Assistive device: 1 person hand held assist(assist at trunk with gait belt) Gait Pattern/deviations: Step-through pattern;Decreased stride length;Decreased stance time -  left;Drifts right/left;Narrow base of support;Decreased step length - right;Decreased step length - left Gait velocity: decreased   General Gait Details: assistance required for balance and weight shifting; continued impaired propriception and R side gaze preference; pt able to cross midline to find objects on L side of hallway with cues and then returns to R  (+2 for line management)   Stairs             Wheelchair Mobility    Modified Rankin (Stroke Patients Only) Modified Rankin (Stroke Patients Only) Pre-Morbid Rankin Score: No symptoms Modified Rankin: Moderately severe disability     Balance Overall balance assessment: Needs assistance Sitting-balance support: Bilateral upper extremity supported;Feet supported Sitting balance-Leahy Scale: Fair     Standing balance support: Single extremity supported;During functional activity Standing balance-Leahy Scale: Poor                              Cognition Arousal/Alertness: Awake/alert Behavior During Therapy: Impulsive Overall Cognitive Status: Impaired/Different from baseline Area of Impairment: Awareness;Safety/judgement;Following commands;Problem solving                       Following Commands: Follows multi-step commands inconsistently;Follows one step commands with increased time Safety/Judgement: Decreased awareness of deficits;Decreased awareness of safety Awareness: Emergent Problem Solving: Difficulty sequencing;Requires verbal cues        Exercises      General Comments General comments (skin integrity, edema, etc.): pt reports decreased dizziness and nasuea today      Pertinent Vitals/Pain Pain Assessment: No/denies pain    Home Living  Prior Function            PT Goals (current goals can now be found in the care plan section) Progress towards PT goals: Progressing toward goals    Frequency    Min 4X/week      PT Plan Current plan  remains appropriate    Co-evaluation              AM-PAC PT "6 Clicks" Mobility   Outcome Measure  Help needed turning from your back to your side while in a flat bed without using bedrails?: None Help needed moving from lying on your back to sitting on the side of a flat bed without using bedrails?: A Little Help needed moving to and from a bed to a chair (including a wheelchair)?: A Lot Help needed standing up from a chair using your arms (e.g., wheelchair or bedside chair)?: A Little Help needed to walk in hospital room?: A Lot Help needed climbing 3-5 steps with a railing? : A Lot 6 Click Score: 16    End of Session Equipment Utilized During Treatment: Gait belt Activity Tolerance: Patient tolerated treatment well Patient left: with call bell/phone within reach;in chair;with chair alarm set;with family/visitor present Nurse Communication: Mobility status PT Visit Diagnosis: Unsteadiness on feet (R26.81);Other abnormalities of gait and mobility (R26.89);History of falling (Z91.81);Difficulty in walking, not elsewhere classified (R26.2)     Time: [id number] PT Time Calculation (min) (ACUTE ONLY): 28 min  Charges:  $Gait Training: 23-37 mins                     Earney Navy, PTA Acute Rehabilitation Services Pager: [phone] Office: [phone]     Darliss Cheney 05/09/2019, 4:12 PM

## 2019-05-10 LAB — BASIC METABOLIC PANEL
Anion gap: 11 (ref 5–15)
BUN: 13 mg/dL (ref 8–23)
CO2: 27 mmol/L (ref 22–32)
Calcium: 9.2 mg/dL (ref 8.9–10.3)
Chloride: 102 mmol/L (ref 98–111)
Creatinine, Ser: 0.75 mg/dL (ref 0.44–1.00)
GFR calc Af Amer: >60 mL/min (ref >60)
GFR calc non Af Amer: >60 mL/min (ref >60)
Glucose, Bld: 101 mg/dL — ABNORMAL HIGH (ref 70–99)
Potassium: 3.8 mmol/L (ref 3.5–5.1)
Sodium: 140 mmol/L (ref 135–145)

## 2019-05-10 LAB — PHOSPHORUS: Phosphorus: 4.1 mg/dL (ref 2.5–4.6)

## 2019-05-10 LAB — MAGNESIUM: Magnesium: 1.8 mg/dL (ref 1.7–2.4)

## 2019-05-10 MED ORDER — ADULT MULTIVITAMIN W/MINERALS CH: 1.0000 | ORAL_TABLET | Freq: Every day | ORAL | Status: DC

## 2019-05-10 MED ORDER — FOLIC ACID 1 MG PO TABS: 1.0000 mg | ORAL_TABLET | Freq: Every day | ORAL | Status: DC

## 2019-05-10 MED ORDER — ASPIRIN 81 MG PO TBEC: 81.0000 mg | DELAYED_RELEASE_TABLET | Freq: Every day | ORAL | Status: DC

## 2019-05-10 MED ORDER — ATORVASTATIN CALCIUM 40 MG PO TABS: 40.0000 mg | ORAL_TABLET | Freq: Every day | ORAL | 11 refills | Status: DC

## 2019-05-10 MED ORDER — CLOPIDOGREL BISULFATE 75 MG PO TABS: 75.0000 mg | ORAL_TABLET | Freq: Every day | ORAL | Status: DC

## 2019-05-10 MED ORDER — THIAMINE HCL 100 MG PO TABS: 100.0000 mg | ORAL_TABLET | Freq: Every day | ORAL | Status: DC

## 2019-05-10 MED ORDER — HYDRALAZINE HCL 25 MG PO TABS: 25.0000 mg | ORAL_TABLET | Freq: Three times a day (TID) | ORAL | Status: DC

## 2019-05-10 MED ORDER — HYDRALAZINE HCL 25 MG PO TABS
25.0000 mg | ORAL_TABLET | Freq: Three times a day (TID) | ORAL | Status: DC
  Administered 2019-05-10 – 2019-05-11 (×4): 25 mg via ORAL
  Filled 2019-05-10 (×4): qty 1

## 2019-05-10 MED ORDER — SIMVASTATIN 20 MG PO TABS: 20.0000 mg | ORAL_TABLET | Freq: Every day | ORAL | 0 refills | Status: DC

## 2019-05-10 NOTE — Discharge Summary (Signed)
Physician Discharge Summary  Stacie [name] [id number] DOB: [birth year] DOA: 05/04/2019  PCP: Stacie Buffy, NP  Admit date: 05/04/2019 Discharge date: 05/10/2019  Admitted From: Home Disposition: CIR  Recommendations for Outpatient Follow-up:  1. Follow up with PCP in 1-2 weeks 2. Please obtain BMP/CBC in one week your next doctors visit.  3. Aspirin Plavix for 3 weeks followed by aspirin alone. 4. Lipitor 40 mg daily 5. Follow-up outpatient neurology in 3-4 weeks 6. Counseled to quit drinking.  Vitamins and oral supplements prescribed 7. Added hydralazine 25 mg every 8 hours to her blood pressure regimen.   Discharge Condition: Stable CODE STATUS: Full Diet recommendation: Heart healthy  Brief/Interim Summary: Brief Narrative:  64 year old with history of HTN, polysubstance abuse, previous CVA came to the with acute onset of left-sided facial droop, dysarthria.  Initially she thought the symptoms would go away on its own therefore did not seek medical attention but eventually ended up coming to the ER on 4/1.  CT of the head showed age-indeterminate infarct.  MRI brain showed multifocal infarct.  Neurology team was consulted.  Recommended aspirin and Plavix for 3 weeks.  PT recommended CIR, ongoing placement.  Eventually patient was placed with recommendations as stated above going to CIR.   Acute multifocal white matter CVA -CT of the head showed indeterminate infarct, MRI brain confirmed multifocal infarction.  No large vessel occlusion seen on CTA of the head neck -Echocardiogram-EF 60-65%. -LDL 125, A1c 5.5 -Recommending aspirin 81 mg, Plavix 75 mg daily for 3 weeks followed by aspirin alone. -PT-CIR.    Hypokalemia -Resolved  Nonspecific nausea -Resolved.  Abdominal x-rays negative.  Lipase 28.  Essential hypertension uncontrolled -Resume home medications.  Hydralazine 25 mg 3 times daily added.  Hyperlipidemia -LDL 125.  Lipitor 40 mg daily  Peripheral  arterial disease -Daily aspirin and statin  Chronic diastolic congestive heart failure -Patient appears to be euvolemic.  Echocardiogram showed EF of 60-65%  Severe protein calorie malnutrition -Seen by dietitian.  Supplements have been ordered.  Polysubstance abuse-alcohol, marijuana Tobacco use -Counseled to quit using this   Discharge Diagnoses:  Principal Problem:   CVA (cerebral vascular accident) ([place]) Active Problems:   Essential hypertension   Hyperlipidemia   PAD (peripheral artery disease) ([facility])   Underweight   Depression   Tobacco abuse   Marijuana use   Alcohol abuse   Chronic diastolic CHF (congestive heart failure) ([place])    Consultations:  Neurology  Subjective: Doing well.  Some dysarthria but no other complaints  Discharge Exam: Vitals:   05/10/19 0816 05/10/19 1206  BP: (!) 154/100 (!) 151/96  Pulse: 76 72  Resp: 16 18  Temp: 98.2 F (36.8 C) 98.6 F (37 C)  SpO2: 99% 99%   Vitals:   05/10/19 0039 05/10/19 0433 05/10/19 0816 05/10/19 1206  BP: (!) 189/91 (!) 188/100 (!) 154/100 (!) 151/96  Pulse: 61 64 76 72  Resp:  16 16 18   Temp: 97.9 F (36.6 C) 98.2 F (36.8 C) 98.2 F (36.8 C) 98.6 F (37 C)  TempSrc: Oral Oral Oral Oral  SpO2: 98% 97% 99% 99%  Height:        General: Pt is alert, awake, not in acute distress, bilateral temporal wasting. Cardiovascular: RRR, S1/S2 +, no rubs, no gallops Respiratory: CTA bilaterally, no wheezing, no rhonchi Abdominal: Soft, NT, ND, bowel sounds + Extremities: no edema, no cyanosis Very mild dysarthric speech  Discharge Instructions   Allergies as of 05/10/2019  Reactions   Trazodone And Nefazodone Nausea And Vomiting   Wellbutrin [bupropion] Nausea And Vomiting      Medication List    TAKE these medications   amLODipine 10 MG tablet Commonly known as: NORVASC Take 1 tablet (10 mg total) by mouth daily.   aspirin 81 MG EC tablet Take 1 tablet (81 mg total) by mouth  daily.   atorvastatin 40 MG tablet Commonly known as: Lipitor Take 1 tablet (40 mg total) by mouth daily.   clopidogrel 75 MG tablet Commonly known as: PLAVIX Take 1 tablet (75 mg total) by mouth daily for 21 days.   diphenoxylate-atropine 2.5-0.025 MG tablet Commonly known as: Lomotil Take 1 tablet by mouth 4 (four) times daily as needed for diarrhea or loose stools.   doxylamine (Sleep) 25 MG tablet Commonly known as: UNISOM Take 25 mg by mouth at bedtime as needed for sleep.   escitalopram 20 MG tablet Commonly known as: LEXAPRO TAKE 1 TABLET(20 MG) BY MOUTH DAILY What changed: See the new instructions.   folic acid 1 MG tablet Commonly known as: FOLVITE Take 1 tablet (1 mg total) by mouth daily.   hydrALAZINE 25 MG tablet Commonly known as: APRESOLINE Take 1 tablet (25 mg total) by mouth every 8 (eight) hours.   multivitamin with minerals Tabs tablet Take 1 tablet by mouth daily.   thiamine 100 MG tablet Take 1 tablet (100 mg total) by mouth daily.      Follow-up Information    [name], Stacie Brooke, NP. Schedule an appointment as soon as possible for a visit in 2 week(s).   Specialty: Internal Medicine Contact information: [address] [place] [address] [phone]          Allergies  Allergen Reactions  . Trazodone And Nefazodone Nausea And Vomiting  . Wellbutrin [Bupropion] Nausea And Vomiting    You were cared for by a hospitalist during your hospital stay. If you have any questions about your discharge medications or the care you received while you were in the hospital after you are discharged, you can call the unit and asked to speak with the hospitalist on call if the hospitalist that took care of you is not available. Once you are discharged, your primary care physician will handle any further medical issues. Please note that no refills for any discharge medications will be authorized once you are discharged, as it is imperative that you return  to your primary care physician (or establish a relationship with a primary care physician if you do not have one) for your aftercare needs so that they can reassess your need for medications and monitor your lab values.   Procedures/Studies: CT ANGIO HEAD W OR WO CONTRAST  Result Date: 05/05/2019 CLINICAL DATA:  Stroke follow-up EXAM: CT ANGIOGRAPHY HEAD AND NECK TECHNIQUE: Multidetector CT imaging of the head and neck was performed using the standard protocol during bolus administration of intravenous contrast. Multiplanar CT image reconstructions and MIPs were obtained to evaluate the vascular anatomy. Carotid stenosis measurements (when applicable) are obtained utilizing NASCET criteria, using the distal internal carotid diameter as the denominator. CONTRAST:  175mL OMNIPAQUE IOHEXOL 350 MG/ML SOLN COMPARISON:  Brain MRI from yesterday FINDINGS: CTA NECK FINDINGS Aortic arch: Atherosclerotic plaque. No acute finding or dilatation. Three vessel branching. Right carotid system: Mixed density plaque at the common carotid bifurcation with up to 40% stenosis. No ulceration. There is ICA tortuosity with mild kinking. Left carotid system: Mixed density plaque at the bifurcation with bulb  stenosis measuring up to 30%. No beading or ulceration. Vertebral arteries: Proximal subclavian atheromatous plaque on the left. The left vertebral artery is strongly dominant. Both vertebral arteries are widely patent to the dura. Skeleton: Prominent cervical facet spurring which is generalized and prominent. No acute or aggressive finding. Other neck: Negative Upper chest: Biapical pleural based scarring. Review of the MIP images confirms the above findings CTA HEAD FINDINGS Anterior circulation: Scattered atherosclerotic plaque on the carotid siphons without focal or flow reducing stenosis. No major branch occlusion. No aneurysm or vascular malformation. Posterior circulation: Strong left vertebral artery dominance. Most of  right vertebral flow is to the PICA. Atheromatous irregularity of the bilateral posterior cerebral arteries which is mild to moderate. No branch occlusion or aneurysm. Venous sinuses: Unremarkable in the arterial phase. Anatomic variants: None significant Review of the MIP images confirms the above findings IMPRESSION: 1. No emergent finding. 2. Cervical and intracranial atherosclerosis without flow reducing stenosis or ulceration of major vessels. Electronically Signed   By: Monte Fantasia M.D.   On: 05/05/2019 04:34   DG Abd 1 View  Result Date: 05/07/2019 CLINICAL DATA:  Abdominal distension EXAM: ABDOMEN - 1 VIEW COMPARISON:  CT abdomen dated 08/18/2018. FINDINGS: No dilated large or small bowel loops are appreciated. No evidence of renal or ureteral calculi. No evidence of free air under either diaphragm. No acute or suspicious osseous finding. IMPRESSION: Nonobstructive bowel gas pattern. Electronically Signed   By: Franki Cabot M.D.   On: 05/07/2019 12:48   CT Head Wo Contrast  Result Date: 05/04/2019 CLINICAL DATA:  Neuro deficit, acute, stroke suspected. Additional history provided: Patient transported via EMS from home for slurred speech and left-sided facial droop, symptoms developed Tuesday night (unknown time). History of stroke. EXAM: CT HEAD WITHOUT CONTRAST TECHNIQUE: Contiguous axial images were obtained from the base of the skull through the vertex without intravenous contrast. COMPARISON:  No pertinent prior studies available for comparison. FINDINGS: Brain: Mildly motion degraded examination. Age-indeterminate infarct within the right corona radiata/basal ganglia. There is an additional age-indeterminate infarct more anteriorly within the right basal ganglia also involve the adjacent deep right frontal white matter. There is no evidence of acute intracranial hemorrhage. No midline shift or extra-axial fluid collection. Background mild ill-defined hypoattenuation within the cerebral white  matter is nonspecific, but consistent with chronic small vessel ischemic disease. Mild generalized parenchymal atrophy. Vascular: No hyperdense vessel is identified. Atherosclerotic calcifications. Skull: Normal. Negative for fracture or focal lesion. Sinuses/Orbits: Visualized orbits demonstrate no acute abnormality. No significant paranasal sinus disease or mastoid effusion at the imaged levels. IMPRESSION: Mildly motion degraded examination. Age-indeterminate infarcts within the right frontal lobe white matter and right basal ganglia as described, possibly acute or subacute. Brain MRI is suggested for further evaluation. Background mild generalized parenchymal atrophy and chronic small vessel ischemic disease. Electronically Signed   By: Kellie Simmering DO   On: 05/04/2019 17:24   CT ANGIO NECK W OR WO CONTRAST  Result Date: 05/05/2019 CLINICAL DATA:  Stroke follow-up EXAM: CT ANGIOGRAPHY HEAD AND NECK TECHNIQUE: Multidetector CT imaging of the head and neck was performed using the standard protocol during bolus administration of intravenous contrast. Multiplanar CT image reconstructions and MIPs were obtained to evaluate the vascular anatomy. Carotid stenosis measurements (when applicable) are obtained utilizing NASCET criteria, using the distal internal carotid diameter as the denominator. CONTRAST:  173mL OMNIPAQUE IOHEXOL 350 MG/ML SOLN COMPARISON:  Brain MRI from yesterday FINDINGS: CTA NECK FINDINGS Aortic arch: Atherosclerotic plaque. No acute  finding or dilatation. Three vessel branching. Right carotid system: Mixed density plaque at the common carotid bifurcation with up to 40% stenosis. No ulceration. There is ICA tortuosity with mild kinking. Left carotid system: Mixed density plaque at the bifurcation with bulb stenosis measuring up to 30%. No beading or ulceration. Vertebral arteries: Proximal subclavian atheromatous plaque on the left. The left vertebral artery is strongly dominant. Both vertebral  arteries are widely patent to the dura. Skeleton: Prominent cervical facet spurring which is generalized and prominent. No acute or aggressive finding. Other neck: Negative Upper chest: Biapical pleural based scarring. Review of the MIP images confirms the above findings CTA HEAD FINDINGS Anterior circulation: Scattered atherosclerotic plaque on the carotid siphons without focal or flow reducing stenosis. No major branch occlusion. No aneurysm or vascular malformation. Posterior circulation: Strong left vertebral artery dominance. Most of right vertebral flow is to the PICA. Atheromatous irregularity of the bilateral posterior cerebral arteries which is mild to moderate. No branch occlusion or aneurysm. Venous sinuses: Unremarkable in the arterial phase. Anatomic variants: None significant Review of the MIP images confirms the above findings IMPRESSION: 1. No emergent finding. 2. Cervical and intracranial atherosclerosis without flow reducing stenosis or ulceration of major vessels. Electronically Signed   By: Monte Fantasia M.D.   On: 05/05/2019 04:34   MR BRAIN WO CONTRAST  Result Date: 05/04/2019 CLINICAL DATA:  Left facial droop and dysarthria noted over the last 3 days. EXAM: MRI HEAD WITHOUT CONTRAST TECHNIQUE: Multiplanar, multiecho pulse sequences of the brain and surrounding structures were obtained without intravenous contrast. COMPARISON:  Head CT same day FINDINGS: Brain: Motion degraded study. No acute finding affects the brainstem or cerebellum. 8 mm acute infarction in the posterior limb internal capsule on the right. Punctate acute infarction in the left caudate head or white matter adjacent to the frontal horn of the lateral ventricle. 8 mm acute to subacute infarction in the white matter adjacent to the atrium of the right lateral ventricle. Other white matter disease seen at CT is not acute or subacute. Old right basal ganglia infarction. No hydrocephalus. No sign of gross hemorrhage. No  extra-axial collection. IMPRESSION: Diffusion only study is degraded by motion. 8 mm acute infarction in the posterior limb internal capsule on the right. Punctate acute infarction either in the left caudate head or white matter adjacent to the frontal horn of the left lateral ventricle. 8 mm acute to subacute infarction in the white matter adjacent to the atrium of the right lateral ventricle. Old small vessel infarctions elsewhere affecting the hemispheric white matter and right basal ganglia. Electronically Signed   By: Nelson Chimes M.D.   On: 05/04/2019 20:58   ECHOCARDIOGRAM COMPLETE  Result Date: 05/05/2019    ECHOCARDIOGRAM REPORT   Patient Name:   Stacie [name] Date of Exam: 05/05/2019 Medical Rec #:  [id number]       Height:       64.6 in Accession #:    [id number]      Weight:       89.1 lb Date of Birth:  [birth year]        BSA:          1.396 m Patient Age:    84 years        BP:           156/74 mmHg Patient Gender: F               HR:  59 bpm. Exam Location:  Inpatient Procedure: 2D Echo Indications:    TIA 435.9 / G45.9  History:        Patient has no prior history of Echocardiogram examinations.                 Stroke; Risk Factors:Dyslipidemia and Hypertension.  Sonographer:    Vikki Ports T[place]ine Referring Phys: [id number] JAN A [place]  1. Normal LV systolic function; grade 1 diastolic dysfunction.  2. Left ventricular ejection fraction, by estimation, is 60 to 65%. The left ventricle has normal function. The left ventricle has no regional wall motion abnormalities. Left ventricular diastolic parameters are consistent with Grade I diastolic dysfunction (impaired relaxation).  3. Right ventricular systolic function is normal. The right ventricular size is normal.  4. The mitral valve is normal in structure. Trivial mitral valve regurgitation. No evidence of mitral stenosis.  5. The aortic valve is tricuspid. Aortic valve regurgitation is not visualized. No aortic stenosis is present.   6. The inferior vena cava is normal in size with greater than 50% respiratory variability, suggesting right atrial pressure of 3 mmHg. FINDINGS  Left Ventricle: Left ventricular ejection fraction, by estimation, is 60 to 65%. The left ventricle has normal function. The left ventricle has no regional wall motion abnormalities. The left ventricular internal cavity size was normal in size. There is  no left ventricular hypertrophy. Left ventricular diastolic parameters are consistent with Grade I diastolic dysfunction (impaired relaxation). Right Ventricle: The right ventricular size is normal. Right ventricular systolic function is normal. Left Atrium: Left atrial size was normal in size. Right Atrium: Right atrial size was normal in size. Pericardium: There is no evidence of pericardial effusion. Mitral Valve: The mitral valve is normal in structure. Normal mobility of the mitral valve leaflets. Trivial mitral valve regurgitation. No evidence of mitral valve stenosis. Tricuspid Valve: The tricuspid valve is normal in structure. Tricuspid valve regurgitation is trivial. No evidence of tricuspid stenosis. Aortic Valve: The aortic valve is tricuspid. Aortic valve regurgitation is not visualized. No aortic stenosis is present. Aortic valve mean gradient measures 4.0 mmHg. Aortic valve peak gradient measures 8.0 mmHg. Aortic valve area, by VTI measures 2.11 cm. Pulmonic Valve: The pulmonic valve was normal in structure. Pulmonic valve regurgitation is not visualized. No evidence of pulmonic stenosis. Aorta: The aortic root is normal in size and structure. Venous: The inferior vena cava is normal in size with greater than 50% respiratory variability, suggesting right atrial pressure of 3 mmHg.  Additional Comments: Normal LV systolic function; grade 1 diastolic dysfunction.  LEFT VENTRICLE PLAX 2D LVIDd:         3.90 cm  Diastology LVIDs:         2.60 cm  LV e' lateral:   10.80 cm/s LV PW:         0.90 cm  LV E/e'  lateral: 8.6 LV IVS:        0.90 cm  LV e' medial:    7.07 cm/s LVOT diam:     1.90 cm  LV E/e' medial:  13.2 LV SV:         72 LV SV Index:   52 LVOT Area:     2.84 cm  RIGHT VENTRICLE RV S prime:     12.70 cm/s TAPSE (M-mode): 2.2 cm LEFT ATRIUM             Index LA diam:        2.80 cm 2.01 cm/m LA Vol (  A2C):   35.4 ml 25.35 ml/m LA Vol (A4C):   38.9 ml 27.86 ml/m LA Biplane Vol: 39.7 ml 28.43 ml/m  AORTIC VALVE AV Area (Vmax):    2.27 cm AV Area (Vmean):   2.26 cm AV Area (VTI):     2.11 cm AV Vmax:           141.00 cm/s AV Vmean:          97.700 cm/s AV VTI:            0.342 m AV Peak Grad:      8.0 mmHg AV Mean Grad:      4.0 mmHg LVOT Vmax:         113.00 cm/s LVOT Vmean:        78.000 cm/s LVOT VTI:          0.255 m LVOT/AV VTI ratio: 0.75  AORTA Ao Root diam: 2.90 cm MITRAL VALVE MV Area (PHT): 2.66 cm    SHUNTS MV Decel Time: 285 msec    Systemic VTI:  0.26 m MV E velocity: 93.10 cm/s  Systemic Diam: 1.90 cm MV A velocity: 99.90 cm/s MV E/A ratio:  0.93 Kirk Ruths MD Electronically signed by Kirk Ruths MD Signature Date/Time: 05/05/2019/2:17:46 PM    Final      The results of significant diagnostics from this hospitalization (including imaging, microbiology, ancillary and laboratory) are listed below for reference.     Microbiology: Recent Results (from the past 240 hour(s))  Urine culture     Status: Abnormal   Collection Time: 05/04/19  5:22 PM   Specimen: Urine, Random  Result Value Ref Range Status   Specimen Description URINE, RANDOM  Final   Special Requests   Final    NONE Performed at [facility], [address] [place], [place] [address]    Culture (A)  Final    70,000 COLONIES/mL ESCHERICHIA COLI Confirmed Extended Spectrum Beta-Lactamase Producer (ESBL).  In bloodstream infections from ESBL organisms, carbapenems are preferred over piperacillin/tazobactam. They are shown to have a lower risk of mortality.    Report Status 05/06/2019 FINAL  Final    Organism ID, Bacteria ESCHERICHIA COLI (A)  Final      Susceptibility   Escherichia coli - MIC*    AMPICILLIN >=32 RESISTANT Resistant     CEFAZOLIN >=64 RESISTANT Resistant     CEFTRIAXONE >=64 RESISTANT Resistant     CIPROFLOXACIN <=0.25 SENSITIVE Sensitive     GENTAMICIN <=1 SENSITIVE Sensitive     IMIPENEM <=0.25 SENSITIVE Sensitive     NITROFURANTOIN <=16 SENSITIVE Sensitive     TRIMETH/SULFA <=20 SENSITIVE Sensitive     AMPICILLIN/SULBACTAM >=32 RESISTANT Resistant     PIP/TAZO <=4 SENSITIVE Sensitive     * 70,000 COLONIES/mL ESCHERICHIA COLI  SARS CORONAVIRUS 2 (TAT 6-24 HRS) Nasopharyngeal Nasopharyngeal Swab     Status: None   Collection Time: 05/04/19 10:46 PM   Specimen: Nasopharyngeal Swab  Result Value Ref Range Status   SARS Coronavirus 2 NEGATIVE NEGATIVE Final    Comment: (NOTE) SARS-CoV-2 target nucleic acids are NOT DETECTED. The SARS-CoV-2 RNA is generally detectable in upper and lower respiratory specimens during the acute phase of infection. Negative results do not preclude SARS-CoV-2 infection, do not rule out co-infections with other pathogens, and should not be used as the sole basis for treatment or other patient management decisions. Negative results must be combined with clinical observations, patient history, and epidemiological information. The expected result is Negative. Fact  Sheet for Patients: SugarRoll.be Fact Sheet for Healthcare Providers: https://www.woods-mathews.com/ This test is not yet approved or cleared by the [place] FDA and  has been authorized for detection and/or diagnosis of SARS-CoV-2 by FDA under an Emergency Use Authorization (EUA). This EUA will remain  in effect (meaning this test can be used) for the duration of the COVID-19 declaration under Section 56 4(b)(1) of the Act, 21 U.S.C. section 360bbb-3(b)(1), unless the authorization is terminated or revoked sooner. Performed at  [facility], [name] [address] [place], [place] [address]      Labs: BNP (last 3 results) Recent Labs    05/06/19 1027  BNP [id number]*   Basic Metabolic Panel: Recent Labs  Lab 05/07/19 0408 05/07/19 0408 05/07/19 1531 05/08/19 0336 05/08/19 0847 05/09/19 0227 05/10/19 0357  NA 139   < > 141 140 139 141 140  K 2.7*   < > 4.0 4.2 4.7 3.8 3.8  CL 99   < > 104 104 103 104 102  CO2 23   < > 22 22 22 25 27   GLUCOSE 70   < > 102* 104* 106* 99 101*  BUN 6*   < > 11 12 13 13 13   CREATININE 0.79   < > 0.96 0.77 0.87 0.67 0.75  CALCIUM 9.0   < > 9.6 9.4 9.5 9.3 9.2  MG 1.8  --   --  1.8  --  1.7 1.8  PHOS 3.5  --   --  2.8  --  3.5 4.1   < > = values in this interval not displayed.   Liver Function Tests: Recent Labs  Lab 05/04/19 1536 05/08/19 0847  AST 25 27  ALT 16 21  ALKPHOS 81 74  BILITOT 1.0 0.9  PROT 6.9 7.3  ALBUMIN 4.1 4.0   Recent Labs  Lab 05/04/19 1536 05/07/19 1531  LIPASE 26 28   No results for input(s): AMMONIA in the last 168 hours. CBC: Recent Labs  Lab 05/04/19 1536 05/08/19 0847  WBC 10.0 7.6  NEUTROABS 8.0*  --   HGB 14.0 14.1  HCT 44.8 44.4  MCV 90.1 89.7  PLT 310 307   Cardiac Enzymes: No results for input(s): CKTOTAL, CKMB, CKMBINDEX, TROPONINI in the last 168 hours. BNP: Invalid input(s): POCBNP CBG: No results for input(s): GLUCAP in the last 168 hours. D-Dimer No results for input(s): DDIMER in the last 72 hours. Hgb A1c No results for input(s): HGBA1C in the last 72 hours. Lipid Profile No results for input(s): CHOL, HDL, LDLCALC, TRIG, CHOLHDL, LDLDIRECT in the last 72 hours. Thyroid function studies No results for input(s): TSH, T4TOTAL, T3FREE, THYROIDAB in the last 72 hours.  Invalid input(s): FREET3 Anemia work up No results for input(s): VITAMINB12, FOLATE, FERRITIN, TIBC, IRON, RETICCTPCT in the last 72 hours. Urinalysis    Component Value Date/Time   COLORURINE YELLOW 05/04/2019 1730   APPEARANCEUR HAZY  (A) 05/04/2019 1730   LABSPEC 1.024 05/04/2019 1730   PHURINE 5.0 05/04/2019 1730   GLUCOSEU NEGATIVE 05/04/2019 1730   HGBUR NEGATIVE 05/04/2019 1730   BILIRUBINUR NEGATIVE 05/04/2019 1730   KETONESUR 80 (A) 05/04/2019 1730   PROTEINUR 100 (A) 05/04/2019 1730   NITRITE NEGATIVE 05/04/2019 1730   LEUKOCYTESUR NEGATIVE 05/04/2019 1730   Sepsis Labs Invalid input(s): PROCALCITONIN,  WBC,  LACTICIDVEN Microbiology Recent Results (from the past 240 hour(s))  Urine culture     Status: Abnormal   Collection Time: 05/04/19  5:22 PM   Specimen: Urine,  Random  Result Value Ref Range Status   Specimen Description URINE, RANDOM  Final   Special Requests   Final    NONE Performed at [facility], [address], [place], [place] [address]    Culture (A)  Final    70,000 COLONIES/mL ESCHERICHIA COLI Confirmed Extended Spectrum Beta-Lactamase Producer (ESBL).  In bloodstream infections from ESBL organisms, carbapenems are preferred over piperacillin/tazobactam. They are shown to have a lower risk of mortality.    Report Status 05/06/2019 FINAL  Final   Organism ID, Bacteria ESCHERICHIA COLI (A)  Final      Susceptibility   Escherichia coli - MIC*    AMPICILLIN >=32 RESISTANT Resistant     CEFAZOLIN >=64 RESISTANT Resistant     CEFTRIAXONE >=64 RESISTANT Resistant     CIPROFLOXACIN <=0.25 SENSITIVE Sensitive     GENTAMICIN <=1 SENSITIVE Sensitive     IMIPENEM <=0.25 SENSITIVE Sensitive     NITROFURANTOIN <=16 SENSITIVE Sensitive     TRIMETH/SULFA <=20 SENSITIVE Sensitive     AMPICILLIN/SULBACTAM >=32 RESISTANT Resistant     PIP/TAZO <=4 SENSITIVE Sensitive     * 70,000 COLONIES/mL ESCHERICHIA COLI  SARS CORONAVIRUS 2 (TAT 6-24 HRS) Nasopharyngeal Nasopharyngeal Swab     Status: None   Collection Time: 05/04/19 10:46 PM   Specimen: Nasopharyngeal Swab  Result Value Ref Range Status   SARS Coronavirus 2 NEGATIVE NEGATIVE Final    Comment: (NOTE) SARS-CoV-2 target nucleic acids  are NOT DETECTED. The SARS-CoV-2 RNA is generally detectable in upper and lower respiratory specimens during the acute phase of infection. Negative results do not preclude SARS-CoV-2 infection, do not rule out co-infections with other pathogens, and should not be used as the sole basis for treatment or other patient management decisions. Negative results must be combined with clinical observations, patient history, and epidemiological information. The expected result is Negative. Fact Sheet for Patients: SugarRoll.be Fact Sheet for Healthcare Providers: https://www.woods-mathews.com/ This test is not yet approved or cleared by the [place] FDA and  has been authorized for detection and/or diagnosis of SARS-CoV-2 by FDA under an Emergency Use Authorization (EUA). This EUA will remain  in effect (meaning this test can be used) for the duration of the COVID-19 declaration under Section 56 4(b)(1) of the Act, 21 U.S.C. section 360bbb-3(b)(1), unless the authorization is terminated or revoked sooner. Performed at [facility], [place] [address] [place], [place] [address]      Time coordinating discharge:  I have spent 35 minutes face to face with the patient and on the ward discussing the patients care, assessment, plan and disposition with other care givers. >50% of the time was devoted counseling the patient about the risks and benefits of treatment/Discharge disposition and coordinating care.   SIGNED:   Damita Lack, MD  [facility] 05/10/2019, 12:13 PM   If 7PM-7AM, please contact night-coverage

## 2019-05-10 NOTE — Progress Notes (Signed)
  Speech Language Pathology Treatment: Cognitive-Linquistic  Patient Details Name: Stacie [name] MRN: [id number] DOB: 1955-02-08 Today's Date: 05/10/2019 Time: [id number] SLP Time Calculation (min) (ACUTE ONLY): 19 min  Assessment / Plan / Recommendation Clinical Impression  Pt was seen for skilled ST targeting dysarthria and cognitive-linguistic deficits.  Pt was encountered awake/alert and she was agreeable to this tx session.  Pt's daughter was present for the majority of this session.  Pt reported that she is discharging to CIR when a bed becomes available.  Pt's speech was approximately 90-95% intelligible to an unfamiliar listener and she was able to recall the "over-articulation" strategy independently.  Pt demonstrated understanding via using this strategy intermittently throughout this tx session during conversational speech.  Pt and daughter were additionally educated regarding short-term memory strategies including: 1) Write 2) Repeat 3) Associate 4) Picture.  Pt verbalized understanding and she utilized the repetition and association strategies to recall 4/4 grocery list items following a 5 minute delay.  She additionally completed a short-story recall task that was adapted from the CLQT with 100% accuracy independently.  Pt completed functional problem solving tasks that were money and medication related with 6/8 (75%) accuracy independently, improving to 100% given minimal verbal cues and repetition.  Recommend additional ST targeting cognitive-linguistic deficits and dysarthria at next level of care.    HPI HPI: Pt is a 64 y.o. Caucasian female with a known history of hypertension, dyslipidemia and tobacco, marijuana and alcohol abuse as well as depression, who presented to the emergency room with acute onset of left facial droop with dysarthria and weakness in both legs. 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 Punctate acute infarction either in  the left caudate head or white matter adjacent to the frontal horn of the left lateral ventricle. 8 mm acute to subacute infarction in the white matter adjacent to the atrium of the right lateral ventricle.       SLP Plan          Recommendations                   Oral Care Recommendations: Oral care BID Follow up Recommendations: Inpatient Rehab SLP Visit Diagnosis: Cognitive communication deficit (R41.841);Dysarthria and anarthria (R47.1)       GO               Colin Mulders., M.S., [place] [facility] Office: [phone]  [place] 05/10/2019, 3:28 PM

## 2019-05-10 NOTE — Progress Notes (Signed)
Physical Therapy Treatment Patient Details Name: Stacie [name] MRN: [id number] DOB: [birth year] Today's Date: 05/10/2019    History of Present Illness 64 yo female admitted with reports of BIL LE weakness, BP 201/90, hypokalemia CT parenchymal atrophy and chronice small vess ischemica disease. 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 Pt is very eager to continue therapy and return to PLOF. Continue to recommend CIR level therapies to maximize independence and safety with mobility.    Follow Up Recommendations  CIR;Supervision/Assistance - 24 hour     Equipment Recommendations  Other (comment)(TBA)    Recommendations for Other Services Rehab consult     Precautions / Restrictions Precautions Precautions: Fall Restrictions Weight Bearing Restrictions: No    Mobility  Bed Mobility Overal bed mobility: Independent                Transfers Overall transfer level: Needs assistance Equipment used: 1 person hand held assist Transfers: Sit to/from Stand Sit to Stand: Min assist         General transfer comment: assist for balance   Ambulation/Gait Ambulation/Gait assistance: Mod assist;Min assist Gait Distance (Feet): 200 Feet Assistive device: 1 person hand held assist(assist at trunk with gait belt) Gait Pattern/deviations: Step-through pattern;Decreased stride length;Decreased stance time - left;Drifts right/left;Narrow base of support Gait velocity: decreased   General Gait Details: assistance required for balance and  weight shifting; continued impaired propriception and R side gaze preference(+2 for line management)   Stairs             Wheelchair Mobility    Modified Rankin (Stroke Patients Only) Modified Rankin (Stroke Patients Only) Pre-Morbid Rankin Score: No symptoms Modified Rankin: Moderately severe disability     Balance Overall balance assessment: Needs assistance Sitting-balance support: Bilateral upper extremity supported;Feet supported Sitting balance-Leahy Scale: Fair     Standing balance support: Single extremity supported;During functional activity Standing balance-Leahy Scale: Poor               High level balance activites: Side stepping;Backward walking;Head turns High Level Balance Comments: pt unable to hold single leg stance or tandem stance without at least single UE support--multiple reps            Cognition Arousal/Alertness: Awake/alert Behavior During Therapy: WFL for tasks assessed/performed Overall Cognitive Status: Impaired/Different from baseline Area of Impairment: Safety/judgement;Problem solving;Following commands                       Following Commands: Follows multi-step commands inconsistently;Follows one step commands consistently Safety/Judgement: Decreased awareness of deficits;Decreased awareness of safety   Problem Solving: Requires verbal cues;Difficulty sequencing        Exercises Other Exercises Other Exercises: L LE step ups X 20     General Comments        Pertinent Vitals/Pain Pain Assessment: No/denies pain    Home Living                      Prior Function  PT Goals (current goals can now be found in the care plan section) Progress towards PT goals: Progressing toward goals    Frequency    Min 4X/week      PT Plan Current plan remains appropriate    Co-evaluation              AM-PAC PT "6 Clicks" Mobility   Outcome Measure  Help needed turning from your  back to your side while in a flat bed without using bedrails?: None Help needed moving from lying on your back to sitting on the side of a flat bed without using bedrails?: A Little Help needed moving to and from a bed to a chair (including a wheelchair)?: A Little Help needed standing up from a chair using your arms (e.g., wheelchair or bedside chair)?: A Little Help needed to walk in hospital room?: A Lot Help needed climbing 3-5 steps with a railing? : A Lot 6 Click Score: 17    End of Session Equipment Utilized During Treatment: Gait belt Activity Tolerance: Patient tolerated treatment well Patient left: with call bell/phone within reach;in bed;with bed alarm set Nurse Communication: Mobility status PT Visit Diagnosis: Unsteadiness on feet (R26.81);Other abnormalities of gait and mobility (R26.89);History of falling (Z91.81);Difficulty in walking, not elsewhere classified (R26.2)     Time: [id number] PT Time Calculation (min) (ACUTE ONLY): 28 min  Charges:  $Gait Training: 8-22 mins $Neuromuscular Re-education: 8-22 mins                     Earney Navy, PTA Acute Rehabilitation Services Pager: [phone] Office: [phone]     Darliss Cheney 05/10/2019, 4:39 PM

## 2019-05-11 DIAGNOSIS — F1010 Alcohol abuse, uncomplicated: Secondary | ICD-10-CM

## 2019-05-11 DIAGNOSIS — I5032 Chronic diastolic (congestive) heart failure: Secondary | ICD-10-CM

## 2019-05-11 DIAGNOSIS — F339 Major depressive disorder, recurrent, unspecified: Secondary | ICD-10-CM

## 2019-05-11 LAB — BASIC METABOLIC PANEL
Anion gap: 11 (ref 5–15)
BUN: 14 mg/dL (ref 8–23)
CO2: 26 mmol/L (ref 22–32)
Calcium: 9 mg/dL (ref 8.9–10.3)
Chloride: 102 mmol/L (ref 98–111)
Creatinine, Ser: 0.73 mg/dL (ref 0.44–1.00)
GFR calc Af Amer: >60 mL/min (ref >60)
GFR calc non Af Amer: >60 mL/min (ref >60)
Glucose, Bld: 108 mg/dL — ABNORMAL HIGH (ref 70–99)
Potassium: 3.4 mmol/L — ABNORMAL LOW (ref 3.5–5.1)
Sodium: 139 mmol/L (ref 135–145)

## 2019-05-11 LAB — MAGNESIUM: Magnesium: 1.8 mg/dL (ref 1.7–2.4)

## 2019-05-11 LAB — PHOSPHORUS: Phosphorus: 4.6 mg/dL (ref 2.5–4.6)

## 2019-05-11 MED ORDER — POTASSIUM CHLORIDE CRYS ER 20 MEQ PO TBCR
20.0000 meq | EXTENDED_RELEASE_TABLET | Freq: Once | ORAL | Status: AC
  Administered 2019-05-11: 20 meq via ORAL
  Filled 2019-05-11: qty 1

## 2019-05-11 MED ORDER — HYDRALAZINE HCL 50 MG PO TABS
50.0000 mg | ORAL_TABLET | Freq: Three times a day (TID) | ORAL | Status: DC
  Administered 2019-05-11 – 2019-05-12 (×4): 50 mg via ORAL
  Filled 2019-05-11 (×4): qty 1

## 2019-05-11 MED ORDER — HYDRALAZINE HCL 50 MG PO TABS: 50.0000 mg | ORAL_TABLET | Freq: Three times a day (TID) | ORAL | 1 refills | Status: DC

## 2019-05-11 NOTE — Progress Notes (Signed)
Physical Therapy Treatment Patient Details Name: Stacie [name] MRN: [id number] DOB: January 01, 1956 Today's Date: 05/11/2019    History of Present Illness 64 yo female admitted with reports of BIL LE weakness, BP 201/90, hypokalemia CT parenchymal atrophy and chronice small vess ischemica disease. 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 Pt continues to present with impaired gait and balance and with DGI score of 13/24 indicating risk for falls. Pt will continue to benefit from further skilled PT services to maximize independence and safety with mobility.       Follow Up Recommendations  CIR;Supervision/Assistance - 24 hour     Equipment Recommendations  Other (comment)(TBA)    Recommendations for Other Services Rehab consult     Precautions / Restrictions Precautions Precautions: Fall Restrictions Weight Bearing Restrictions: No    Mobility  Bed Mobility Overal bed mobility: Independent             General bed mobility comments: sitting EOB on arrival  Transfers Overall transfer level: Needs assistance Equipment used: Rolling walker (2 wheeled) Transfers: Sit to/from Stand Sit to Stand: Min assist;Mod assist         General transfer comment: assistance required for balance while powering up and upon standing  Ambulation/Gait Ambulation/Gait assistance: Mod assist;Min assist Gait Distance (Feet): (3 bouts of ~100 ft) Assistive device: 1 person hand held assist(assist at trunk with gait belt) Gait Pattern/deviations:  Decreased stride length;Decreased stance time - left;Drifts right/left;Step-through pattern;Decreased dorsiflexion - left;Staggering left;Narrow base of support Gait velocity: decreased   General Gait Details: assistance required for balance and weight shifting; continued impaired propriception and R side gaze preference; continued L LE weakeness but demonstrates improvement in step length/height    Stairs             Wheelchair Mobility    Modified Rankin (Stroke Patients Only) Modified Rankin (Stroke Patients Only) Pre-Morbid Rankin Score: No symptoms Modified Rankin: Moderately severe disability     Balance Overall balance assessment: Needs assistance Sitting-balance support: Bilateral upper extremity supported Sitting balance-Leahy Scale: Good     Standing balance support: Bilateral upper extremity supported;During functional activity Standing balance-Leahy Scale: Poor                   Standardized Balance Assessment Standardized Balance Assessment : Dynamic Gait Index   Dynamic Gait Index Level Surface: Mild Impairment Change in Gait Speed: Mild Impairment Gait with Horizontal Head Turns: Mild Impairment Gait with Vertical Head Turns: Mild Impairment Gait and Pivot Turn: Moderate Impairment Step Over Obstacle: Mild Impairment Step Around Obstacles: Severe Impairment Steps: Mild Impairment Total Score: 13      Cognition Arousal/Alertness: Awake/alert Behavior During Therapy: WFL for tasks assessed/performed Overall Cognitive Status: Impaired/Different from baseline Area of Impairment: Safety/judgement;Problem solving;Following commands                       Following Commands: Follows multi-step commands inconsistently;Follows one step commands consistently Safety/Judgement: Decreased awareness of safety;Decreased awareness of deficits Awareness: Emergent Problem Solving: Requires  verbal cues;Difficulty sequencing General Comments:  patient pleasant but motivated to go home      Exercises      General Comments        Pertinent Vitals/Pain Pain Assessment: No/denies pain    Home Living                      Prior Function            PT Goals (current goals can now be found in the care plan section) Acute Rehab PT Goals Patient Stated Goal: to go home Progress towards PT goals: Progressing toward goals    Frequency    Min 4X/week      PT Plan Current plan remains appropriate    Co-evaluation       OT goals addressed during session: ADL's and self-care;Strengthening/ROM      AM-PAC PT "6 Clicks" Mobility   Outcome Measure  Help needed turning from your back to your side while in a flat bed without using bedrails?: None Help needed moving from lying on your back to sitting on the side of a flat bed without using bedrails?: A Little Help needed moving to and from a bed to a chair (including a wheelchair)?: A Little Help needed standing up from a chair using your arms (e.g., wheelchair or bedside chair)?: A Little Help needed to walk in hospital room?: A Lot Help needed climbing 3-5 steps with a railing? : A Lot 6 Click Score: 17    End of Session Equipment Utilized During Treatment: Gait belt Activity Tolerance: Patient tolerated treatment well Patient left: with call bell/phone within reach;in bed;with bed alarm set;with family/visitor present Nurse Communication: Mobility status PT Visit Diagnosis: Unsteadiness on feet (R26.81);Other abnormalities of gait and mobility (R26.89);History of falling (Z91.81);Difficulty in walking, not elsewhere classified (R26.2)     Time: [id number] PT Time Calculation (min) (ACUTE ONLY): 18 min  Charges:  $Neuromuscular Re-education: 8-22 mins                     Earney Navy, PTA Acute Rehabilitation Services Pager: [phone] Office: [phone]     Darliss Cheney 05/11/2019, 11:13 AM

## 2019-05-11 NOTE — H&P (Signed)
Physical Medicine and Rehabilitation Admission H&P    Chief Complaint  Patient presents with  . Stroke Symptoms  : HPI: Stacie [name] is a 64 year old right-handed female with history of hypertension, hyperlipidemia, tobacco abuse as well as marijuana.  Patient lives with her ex-husband.  Independent prior to admission.  Two-level home bed and bath on main level 3 steps to entry.  Presented on 05/04/2019 with bilateral lower extremity weakness and difficulty with ambulation with facial droop and dysarthria.  Cranial CT scan age-indeterminate infarcts within the right frontal lobe white matter and right basal ganglia possibly acute or subacute.  MRI showed a 8 mm acute infarction posterior limb internal capsule on the right.  Punctate acute infarct either in the left caudate head or white matter adjacent to the frontal horn of the left lateral ventricle.  8 mm acute to subacute infarct in the white matter adjacent to the atrium of the right lateral ventricle.  Patient did not receive TPA.  CT angiogram head and neck no emergent findings.  Echocardiogram with ejection fraction 65% without emboli.  Admission chemistries unremarkable except potassium 3.3, alcohol negative, urine drug screen positive marijuana, urinalysis greater than 70,000 ESBL.  Maintained on aspirin and Plavix for CVA prophylaxis x3 weeks then aspirin alone.  Subcutaneous Lovenox for DVT prophylaxis.  Therapy evaluations completed and patient was admitted for a comprehensive rehab program.  Review of Systems  Constitutional: Negative for chills and fever.  HENT: Negative for hearing loss.   Eyes: Negative for blurred vision and double vision.  Respiratory: Negative for cough and shortness of breath.   Cardiovascular: Negative for chest pain, palpitations and leg swelling.  Gastrointestinal: Positive for constipation. Negative for heartburn, nausea and vomiting.  Genitourinary: Negative for dysuria, flank pain and hematuria.    Musculoskeletal: Positive for myalgias.       Reports of falls when she does drink alcohol.  Skin: Negative for rash.  Neurological: Positive for speech change and weakness.  Psychiatric/Behavioral: Positive for depression. The patient has insomnia.   All other systems reviewed and are negative.  Past Medical History:  Diagnosis Date  . Colitis   . Hyperlipidemia   . Hypertension   . Stroke ([place]) 04/2012   unknown type  . Thyroid disease    History reviewed. No pertinent surgical history. Family History  Problem Relation Age of Onset  . Stroke Mother 36       embolic  . Heart disease Father 50       CAd with CABG, no MI  . Heart disease Sister 9       CAD  . Heart disease Paternal Uncle   . Heart disease Maternal Grandmother   . Heart disease Paternal Grandfather   . Colon cancer Neg Hx   . Esophageal cancer Neg Hx   . Stomach cancer Neg Hx    Social History:  reports that she has been smoking. She has a 2.50 pack-year smoking history. She has never used smokeless tobacco. She reports current alcohol use of about 14.0 standard drinks of alcohol per week. She reports current drug use. Frequency: 7.00 times per week. Drug: Marijuana. Allergies:  Allergies  Allergen Reactions  . Trazodone And Nefazodone Nausea And Vomiting  . Wellbutrin [Bupropion] Nausea And Vomiting   Medications Prior to Admission  Medication Sig Dispense Refill  . amLODipine (NORVASC) 10 MG tablet Take 1 tablet (10 mg total) by mouth daily. 90 tablet 1  . doxylamine, Sleep, (UNISOM) 25 MG tablet Take  25 mg by mouth at bedtime as needed for sleep.     Marland Kitchen escitalopram (LEXAPRO) 20 MG tablet TAKE 1 TABLET(20 MG) BY MOUTH DAILY (Patient taking differently: Take 20 mg by mouth daily. ) 90 tablet 3  . diphenoxylate-atropine (LOMOTIL) 2.5-0.025 MG tablet Take 1 tablet by mouth 4 (four) times daily as needed for diarrhea or loose stools. (Patient not taking: Reported on 10/06/2018) 100 tablet 1    Drug Regimen  Review Drug regimen was reviewed and remains appropriate with no significant issues identified  Home: Home Living Family/patient expects to be discharged to:: Private residence Living Arrangements: Other (Comment)(lived with ex-husband) Available Help at Discharge: Family, Available 24 hours/day Type of Home: House Home Access: Stairs to enter [employer] of Steps: 3 Entrance Stairs-Rails: Can reach both Home Layout: Able to live on main level with bedroom/bathroom Bathroom Shower/Tub: Chiropodist: Standard Home Equipment: None Additional Comments: x2 daughters present reports ex spouse has 3 cats in the house but she does not care for animals   Lives With: Other (Comment)(ex-husband)   Functional History: Prior Function Level of Independence: Independent  Functional Status:  Mobility: Bed Mobility Overal bed mobility: Independent Bed Mobility: Supine to Sit Rolling: Min assist Supine to sit: Min guard Sit to supine: Min assist General bed mobility comments: min guard for safety Transfers Overall transfer level: Needs assistance Equipment used: 1 person hand held assist Transfers: Sit to/from Stand Sit to Stand: Min assist General transfer comment: assist for balance  Ambulation/Gait Ambulation/Gait assistance: Mod assist, Min assist Gait Distance (Feet): 200 Feet Assistive device: 1 person hand held assist(assist at trunk with gait belt) Gait Pattern/deviations: Step-through pattern, Decreased stride length, Decreased stance time - left, Drifts right/left, Narrow base of support General Gait Details: assistance required for balance and weight shifting; continued impaired propriception and R side gaze preference(+2 for line management) Gait velocity: decreased Gait velocity interpretation: <1.31 ft/sec, indicative of household ambulator    ADL: ADL Overall ADL's : Needs assistance/impaired Eating/Feeding: Moderate assistance, Bed  level Eating/Feeding Details (indicate cue type and reason): needed encouragement to visually attend to cup to scoop out ice chips. pt motivated to eat ice so engaged in task.  Grooming: Wash/dry face, Minimal assistance, Bed level Grooming Details (indicate cue type and reason): pt pulling cool wash cloth off face and putting it back in a restless manner Lower Body Dressing Details (indicate cue type and reason): pt declines to don due to urgency and states "no you do it" Toilet Transfer: +2 for physical assistance, Moderate assistance, Ambulation, BSC Toileting- Clothing Manipulation and Hygiene: Moderate assistance, Sitting/lateral lean Functional mobility during ADLs: +2 for physical assistance, Moderate assistance General ADL Comments: pt very restless and overall impulsive at this time. pt needs cues to remain seated and not to pull at leads/ therapists  Cognition: Cognition Overall Cognitive Status: Impaired/Different from baseline Arousal/Alertness: Awake/alert Orientation Level: Oriented X4 Attention: Focused, Sustained Focused Attention: Impaired Focused Attention Impairment: Verbal complex Sustained Attention: Impaired Sustained Attention Impairment: Verbal complex Memory: Impaired Memory Impairment: Retrieval deficit, Storage deficit, Decreased recall of new information(Immediate: 5/5; delayed: 4/5) Awareness: Impaired Awareness Impairment: Emergent impairment Problem Solving: Impaired Problem Solving Impairment: Verbal complex Executive Function: Reasoning Reasoning: Impaired Reasoning Impairment: Verbal complex Cognition Arousal/Alertness: Awake/alert Behavior During Therapy: WFL for tasks assessed/performed Overall Cognitive Status: Impaired/Different from baseline Area of Impairment: Safety/judgement, Problem solving, Following commands Following Commands: Follows multi-step commands inconsistently, Follows one step commands consistently Safety/Judgement: Decreased  awareness of deficits, Decreased awareness  of safety Awareness: Emergent Problem Solving: Requires verbal cues, Difficulty sequencing General Comments: patient with impulsivity and axiety with mobility due to unsteadiness and nausea.    Physical Exam: Blood pressure (!) 190/94, pulse 69, temperature 98.2 F (36.8 C), temperature source Oral, resp. rate 18, height 5\' 4"  (1.626 m), SpO2 98 %. Physical Exam  Constitutional: She is oriented to person, place, and time. No distress.  HENT:  Head: Atraumatic.  Mouth/Throat: Oropharynx is clear and moist. No oropharyngeal exudate.  Eyes: Pupils are equal, round, and reactive to light. Conjunctivae are normal. Right eye exhibits no discharge. Left eye exhibits no discharge.  Neck: No tracheal deviation present. No thyromegaly present.  Cardiovascular: Normal rate and regular rhythm. Exam reveals no friction rub.  No murmur heard. Respiratory: Effort normal. No respiratory distress. She has no wheezes. She has no rales.  GI: Soft. She exhibits no distension. There is no abdominal tenderness.  Musculoskeletal:        General: No tenderness, deformity or edema.     Cervical back: Normal range of motion.  Neurological: She is alert and oriented to person, place, and time.   Left central 7 and 12. Speech clear. Normal language. Reasonable insight and awareness. Mild left inattention. RUE and RLE 4+/5. LUE 4-/5 prox to distal. LLE 4/5. Normal LT and PP in all 4 limbs. DTR's 1+  Skin: Skin is warm and dry. She is not diaphoretic.  Healing,scabbed abrasions bilateral forearms with foam dressings in place  Psychiatric: She has a normal mood and affect. Her behavior is normal. Judgment and thought content normal.    Results for orders placed or performed during the hospital encounter of 05/04/19 (from the past 48 hour(s))  Basic metabolic panel     Status: Abnormal   Collection Time: 05/10/19  3:57 AM  Result Value Ref Range   Sodium 140 135 - 145  mmol/L   Potassium 3.8 3.5 - 5.1 mmol/L   Chloride 102 98 - 111 mmol/L   CO2 27 22 - 32 mmol/L   Glucose, Bld 101 (H) 70 - 99 mg/dL    Comment: Glucose reference range applies only to samples taken after fasting for at least 8 hours.   BUN 13 8 - 23 mg/dL   Creatinine, Ser 0.75 0.44 - 1.00 mg/dL   Calcium 9.2 8.9 - 10.3 mg/dL   GFR calc non Af Amer >60 >60 mL/min   GFR calc Af Amer >60 >60 mL/min   Anion gap 11 5 - 15    Comment: Performed at [facility] [address], [place], [place] [address]  Magnesium     Status: None   Collection Time: 05/10/19  3:57 AM  Result Value Ref Range   Magnesium 1.8 1.7 - 2.4 mg/dL    Comment: Performed at [facility] [address], [place], [place] [address]  Phosphorus     Status: None   Collection Time: 05/10/19  3:57 AM  Result Value Ref Range   Phosphorus 4.1 2.5 - 4.6 mg/dL    Comment: Performed at [facility] [address], [place], [place] [id number]  Basic metabolic panel     Status: Abnormal   Collection Time: 05/11/19  4:26 AM  Result Value Ref Range   Sodium 139 135 - 145 mmol/L   Potassium 3.4 (L) 3.5 - 5.1 mmol/L   Chloride 102 98 - 111 mmol/L   CO2 26 22 - 32 mmol/L   Glucose, Bld 108 (  H) 70 - 99 mg/dL    Comment: Glucose reference range applies only to samples taken after fasting for at least 8 hours.   BUN 14 8 - 23 mg/dL   Creatinine, Ser 0.73 0.44 - 1.00 mg/dL   Calcium 9.0 8.9 - 10.3 mg/dL   GFR calc non Af Amer >60 >60 mL/min   GFR calc Af Amer >60 >60 mL/min   Anion gap 11 5 - 15    Comment: Performed at [facility] [address], [place], [place] [address]  Magnesium     Status: None   Collection Time: 05/11/19  4:26 AM  Result Value Ref Range   Magnesium 1.8 1.7 - 2.4 mg/dL    Comment: Performed at [facility] [address] [place], [place] [address]  Phosphorus     Status: None   Collection Time: 05/11/19  4:26 AM  Result Value Ref Range   Phosphorus 4.6 2.5 - 4.6  mg/dL    Comment: Performed at [facility] [address] [place], [place] [address]   No results found.     Medical Problem List and Plan: 1.  Left-sided weakness with dysarthria secondary to multiple small subcortical infarcts right punctate, left caudate secondary to small vessel disease  -patient may shower  -ELOS/Goals: 7-10 days, supervision to mod I 2.  Antithrombotics: -DVT/anticoagulation: Lovenox  -antiplatelet therapy: Aspirin 81 mg daily and Plavix 75 mg daily x3 weeks then aspirin alone 3. Pain Management: Tylenol as needed 4. Mood: Lexapro 20 mg daily Unisom for sleep  -antipsychotic agents: N/A  -in good spirits at present, team to provide ego support as needed. 5. Neuropsych: This patient is capable of making decisions on her own behalf. 6. Skin/Wound Care: Routine skin checks 7. Fluids/Electrolytes/Nutrition: Routine in and outs with follow-up chemistries on Monday.  8.  Hypertension.  Norvasc 10 mg daily, hydralazine 50 mg every 8 hours.     -SBP elevated at present, follow for pattern and adjust regimen as needed 9.  History of alcohol/tobacco/marijuana use.  Urine drug screen positive marijuana.  Continue NicoDerm patch.  Provide counseling 10.  Hyperlipidemia.  Zocor 11.  UTI ESBL.  Contact precautions ongoing      Elizabeth Sauer 05/11/2019

## 2019-05-11 NOTE — Discharge Summary (Signed)
Physician Discharge Summary  Stacie [name] [id number] DOB: 1955/08/15 DOA: 05/04/2019  PCP: [name], NP  Admit date: 05/04/2019 Discharge date: 05/11/2019  Admitted From: Home Disposition: CIR  The patient was seen and examined stable mildly hypertensive, hydralazine increased from 25-50 3 times daily.   Otherwise no further changes... Cleared for discharge to CIR   Recommendations for Outpatient Follow-up:  1. Follow up with PCP in 1-2 weeks 2. Please obtain BMP/CBC in one week your next doctors visit.  3. Aspirin Plavix for 3 weeks followed by aspirin alone. 4. Lipitor 40 mg daily 5. Follow-up outpatient neurology in 3-4 weeks 6. Counseled to quit drinking.  Vitamins and oral supplements prescribed 7. Added hydralazine 25 mg every 8 hours to her blood pressure regimen.   Discharge Condition: Stable CODE STATUS: Full Diet recommendation: Heart healthy  Brief/Interim Summary: Brief Narrative:  64 year old with history of HTN, polysubstance abuse, previous CVA came to the with acute onset of left-sided facial droop, dysarthria.  Initially she thought the symptoms would go away on its own therefore did not seek medical attention but eventually ended up coming to the ER on 4/1.  CT of the head showed age-indeterminate infarct.  MRI brain showed multifocal infarct.  Neurology team was consulted.  Recommended aspirin and Plavix for 3 weeks.  PT recommended CIR, ongoing placement.  Eventually patient was placed with recommendations as stated above going to CIR.   Acute multifocal white matter CVA -CT of the head showed indeterminate infarct, MRI brain confirmed multifocal infarction.  No large vessel occlusion seen on CTA of the head neck -Echocardiogram-EF 60-65%. -LDL 125, A1c 5.5 -Recommending aspirin 81 mg, Plavix 75 mg daily for 3 weeks followed by aspirin alone. -PT-CIR.    Hypokalemia -Resolved  Nonspecific nausea -Resolved.  Abdominal x-rays negative.   Lipase 28.  Essential hypertension uncontrolled -Resume home medications.  Hydralazine 25 mg 3 times daily added.  Hyperlipidemia -LDL 125.  Lipitor 40 mg daily  Peripheral arterial disease -Daily aspirin and statin  Chronic diastolic congestive heart failure -Patient appears to be euvolemic.  Echocardiogram showed EF of 60-65%  Severe protein calorie malnutrition -Seen by dietitian.  Supplements have been ordered.  Polysubstance abuse-alcohol, marijuana Tobacco use -Counseled to quit using this   Discharge Diagnoses:  Principal Problem:   CVA (cerebral vascular accident) ([place]) Active Problems:   Essential hypertension   Hyperlipidemia   PAD (peripheral artery disease) ([facility])   Underweight   Depression   Tobacco abuse   Marijuana use   Alcohol abuse   Chronic diastolic CHF (congestive heart failure) ([place])    Consultations:  Neurology  Subjective: Doing well.  Some dysarthria but no other complaints  Discharge Exam: Vitals:   05/11/19 0300 05/11/19 0739  BP: (!) 190/94 (!) 190/100  Pulse: 69 69  Resp: 18 15  Temp: 98.2 F (36.8 C) 98.2 F (36.8 C)  SpO2: 98% 98%   Vitals:   05/10/19 1952 05/10/19 2353 05/11/19 0300 05/11/19 0739  BP: (!) 193/105 (!) 169/105 (!) 190/94 (!) 190/100  Pulse: 73 71 69 69  Resp: 18 18 18 15   Temp: 97.9 F (36.6 C) 98 F (36.7 C) 98.2 F (36.8 C) 98.2 F (36.8 C)  TempSrc: Oral Oral Oral Oral  SpO2: 98% 99% 98% 98%  Height:        General: Pt is alert, awake, not in acute distress, bilateral temporal wasting. Cardiovascular: RRR, S1/S2 +, no rubs, no gallops Respiratory: CTA bilaterally, no wheezing, no rhonchi Abdominal:  Soft, NT, ND, bowel sounds + Extremities: no edema, no cyanosis Very mild dysarthric speech  Discharge Instructions   Allergies as of 05/11/2019      Reactions   Trazodone And Nefazodone Nausea And Vomiting   Wellbutrin [bupropion] Nausea And Vomiting      Medication List    TAKE  these medications   amLODipine 10 MG tablet Commonly known as: NORVASC Take 1 tablet (10 mg total) by mouth daily.   aspirin 81 MG EC tablet Take 1 tablet (81 mg total) by mouth daily.   atorvastatin 40 MG tablet Commonly known as: Lipitor Take 1 tablet (40 mg total) by mouth daily.   clopidogrel 75 MG tablet Commonly known as: PLAVIX Take 1 tablet (75 mg total) by mouth daily for 21 days.   diphenoxylate-atropine 2.5-0.025 MG tablet Commonly known as: Lomotil Take 1 tablet by mouth 4 (four) times daily as needed for diarrhea or loose stools.   doxylamine (Sleep) 25 MG tablet Commonly known as: UNISOM Take 25 mg by mouth at bedtime as needed for sleep.   escitalopram 20 MG tablet Commonly known as: LEXAPRO TAKE 1 TABLET(20 MG) BY MOUTH DAILY What changed: See the new instructions.   folic acid 1 MG tablet Commonly known as: FOLVITE Take 1 tablet (1 mg total) by mouth daily.   hydrALAZINE 50 MG tablet Commonly known as: APRESOLINE Take 1 tablet (50 mg total) by mouth every 8 (eight) hours.   multivitamin with minerals Tabs tablet Take 1 tablet by mouth daily.   thiamine 100 MG tablet Take 1 tablet (100 mg total) by mouth daily.      Follow-up Information    Nche, Charlene Brooke, NP. Schedule an appointment as soon as possible for a visit in 2 week(s).   Specialty: Internal Medicine Contact information: [address] [place] [address] [phone]          Allergies  Allergen Reactions  . Trazodone And Nefazodone Nausea And Vomiting  . Wellbutrin [Bupropion] Nausea And Vomiting    You were cared for by a hospitalist during your hospital stay. If you have any questions about your discharge medications or the care you received while you were in the hospital after you are discharged, you can call the unit and asked to speak with the hospitalist on call if the hospitalist that took care of you is not available. Once you are discharged, your primary care  physician will handle any further medical issues. Please note that no refills for any discharge medications will be authorized once you are discharged, as it is imperative that you return to your primary care physician (or establish a relationship with a primary care physician if you do not have one) for your aftercare needs so that they can reassess your need for medications and monitor your lab values.   Procedures/Studies: CT ANGIO HEAD W OR WO CONTRAST  Result Date: 05/05/2019 CLINICAL DATA:  Stroke follow-up EXAM: CT ANGIOGRAPHY HEAD AND NECK TECHNIQUE: Multidetector CT imaging of the head and neck was performed using the standard protocol during bolus administration of intravenous contrast. Multiplanar CT image reconstructions and MIPs were obtained to evaluate the vascular anatomy. Carotid stenosis measurements (when applicable) are obtained utilizing NASCET criteria, using the distal internal carotid diameter as the denominator. CONTRAST:  136mL OMNIPAQUE IOHEXOL 350 MG/ML SOLN COMPARISON:  Brain MRI from yesterday FINDINGS: CTA NECK FINDINGS Aortic arch: Atherosclerotic plaque. No acute finding or dilatation. Three vessel branching. Right carotid system: Mixed density plaque at  the common carotid bifurcation with up to 40% stenosis. No ulceration. There is ICA tortuosity with mild kinking. Left carotid system: Mixed density plaque at the bifurcation with bulb stenosis measuring up to 30%. No beading or ulceration. Vertebral arteries: Proximal subclavian atheromatous plaque on the left. The left vertebral artery is strongly dominant. Both vertebral arteries are widely patent to the dura. Skeleton: Prominent cervical facet spurring which is generalized and prominent. No acute or aggressive finding. Other neck: Negative Upper chest: Biapical pleural based scarring. Review of the MIP images confirms the above findings CTA HEAD FINDINGS Anterior circulation: Scattered atherosclerotic plaque on the carotid  siphons without focal or flow reducing stenosis. No major branch occlusion. No aneurysm or vascular malformation. Posterior circulation: Strong left vertebral artery dominance. Most of right vertebral flow is to the PICA. Atheromatous irregularity of the bilateral posterior cerebral arteries which is mild to moderate. No branch occlusion or aneurysm. Venous sinuses: Unremarkable in the arterial phase. Anatomic variants: None significant Review of the MIP images confirms the above findings IMPRESSION: 1. No emergent finding. 2. Cervical and intracranial atherosclerosis without flow reducing stenosis or ulceration of major vessels. Electronically Signed   By: Monte Fantasia M.D.   On: 05/05/2019 04:34   DG Abd 1 View  Result Date: 05/07/2019 CLINICAL DATA:  Abdominal distension EXAM: ABDOMEN - 1 VIEW COMPARISON:  CT abdomen dated 08/18/2018. FINDINGS: No dilated large or small bowel loops are appreciated. No evidence of renal or ureteral calculi. No evidence of free air under either diaphragm. No acute or suspicious osseous finding. IMPRESSION: Nonobstructive bowel gas pattern. Electronically Signed   By: Franki Cabot M.D.   On: 05/07/2019 12:48   CT Head Wo Contrast  Result Date: 05/04/2019 CLINICAL DATA:  Neuro deficit, acute, stroke suspected. Additional history provided: Patient transported via EMS from home for slurred speech and left-sided facial droop, symptoms developed Tuesday night (unknown time). History of stroke. EXAM: CT HEAD WITHOUT CONTRAST TECHNIQUE: Contiguous axial images were obtained from the base of the skull through the vertex without intravenous contrast. COMPARISON:  No pertinent prior studies available for comparison. FINDINGS: Brain: Mildly motion degraded examination. Age-indeterminate infarct within the right corona radiata/basal ganglia. There is an additional age-indeterminate infarct more anteriorly within the right basal ganglia also involve the adjacent deep right frontal  white matter. There is no evidence of acute intracranial hemorrhage. No midline shift or extra-axial fluid collection. Background mild ill-defined hypoattenuation within the cerebral white matter is nonspecific, but consistent with chronic small vessel ischemic disease. Mild generalized parenchymal atrophy. Vascular: No hyperdense vessel is identified. Atherosclerotic calcifications. Skull: Normal. Negative for fracture or focal lesion. Sinuses/Orbits: Visualized orbits demonstrate no acute abnormality. No significant paranasal sinus disease or mastoid effusion at the imaged levels. IMPRESSION: Mildly motion degraded examination. Age-indeterminate infarcts within the right frontal lobe white matter and right basal ganglia as described, possibly acute or subacute. Brain MRI is suggested for further evaluation. Background mild generalized parenchymal atrophy and chronic small vessel ischemic disease. Electronically Signed   By: Kellie Simmering DO   On: 05/04/2019 17:24   CT ANGIO NECK W OR WO CONTRAST  Result Date: 05/05/2019 CLINICAL DATA:  Stroke follow-up EXAM: CT ANGIOGRAPHY HEAD AND NECK TECHNIQUE: Multidetector CT imaging of the head and neck was performed using the standard protocol during bolus administration of intravenous contrast. Multiplanar CT image reconstructions and MIPs were obtained to evaluate the vascular anatomy. Carotid stenosis measurements (when applicable) are obtained utilizing NASCET criteria, using the distal internal  carotid diameter as the denominator. CONTRAST:  154mL OMNIPAQUE IOHEXOL 350 MG/ML SOLN COMPARISON:  Brain MRI from yesterday FINDINGS: CTA NECK FINDINGS Aortic arch: Atherosclerotic plaque. No acute finding or dilatation. Three vessel branching. Right carotid system: Mixed density plaque at the common carotid bifurcation with up to 40% stenosis. No ulceration. There is ICA tortuosity with mild kinking. Left carotid system: Mixed density plaque at the bifurcation with bulb  stenosis measuring up to 30%. No beading or ulceration. Vertebral arteries: Proximal subclavian atheromatous plaque on the left. The left vertebral artery is strongly dominant. Both vertebral arteries are widely patent to the dura. Skeleton: Prominent cervical facet spurring which is generalized and prominent. No acute or aggressive finding. Other neck: Negative Upper chest: Biapical pleural based scarring. Review of the MIP images confirms the above findings CTA HEAD FINDINGS Anterior circulation: Scattered atherosclerotic plaque on the carotid siphons without focal or flow reducing stenosis. No major branch occlusion. No aneurysm or vascular malformation. Posterior circulation: Strong left vertebral artery dominance. Most of right vertebral flow is to the PICA. Atheromatous irregularity of the bilateral posterior cerebral arteries which is mild to moderate. No branch occlusion or aneurysm. Venous sinuses: Unremarkable in the arterial phase. Anatomic variants: None significant Review of the MIP images confirms the above findings IMPRESSION: 1. No emergent finding. 2. Cervical and intracranial atherosclerosis without flow reducing stenosis or ulceration of major vessels. Electronically Signed   By: Monte Fantasia M.D.   On: 05/05/2019 04:34   MR BRAIN WO CONTRAST  Result Date: 05/04/2019 CLINICAL DATA:  Left facial droop and dysarthria noted over the last 3 days. EXAM: MRI HEAD WITHOUT CONTRAST TECHNIQUE: Multiplanar, multiecho pulse sequences of the brain and surrounding structures were obtained without intravenous contrast. COMPARISON:  Head CT same day FINDINGS: Brain: Motion degraded study. No acute finding affects the brainstem or cerebellum. 8 mm acute infarction in the posterior limb internal capsule on the right. Punctate acute infarction in the left caudate head or white matter adjacent to the frontal horn of the lateral ventricle. 8 mm acute to subacute infarction in the white matter adjacent to the  atrium of the right lateral ventricle. Other white matter disease seen at CT is not acute or subacute. Old right basal ganglia infarction. No hydrocephalus. No sign of gross hemorrhage. No extra-axial collection. IMPRESSION: Diffusion only study is degraded by motion. 8 mm acute infarction in the posterior limb internal capsule on the right. Punctate acute infarction either in the left caudate head or white matter adjacent to the frontal horn of the left lateral ventricle. 8 mm acute to subacute infarction in the white matter adjacent to the atrium of the right lateral ventricle. Old small vessel infarctions elsewhere affecting the hemispheric white matter and right basal ganglia. Electronically Signed   By: Nelson Chimes M.D.   On: 05/04/2019 20:58   ECHOCARDIOGRAM COMPLETE  Result Date: 05/05/2019    ECHOCARDIOGRAM REPORT   Patient Name:   TARIANNA MARZE Date of Exam: 05/05/2019 Medical Rec #:  [id number]       Height:       64.6 in Accession #:    [id number]      Weight:       89.1 lb Date of Birth:  12-05-1955        BSA:          1.396 m Patient Age:    64 years        BP:  156/74 mmHg Patient Gender: F               HR:           59 bpm. Exam Location:  Inpatient Procedure: 2D Echo Indications:    TIA 435.9 / G45.9  History:        Patient has no prior history of Echocardiogram examinations.                 Stroke; Risk Factors:Dyslipidemia and Hypertension.  Sonographer:    Vikki Ports Turrentine Referring Phys: [id number] JAN A [place]  1. Normal LV systolic function; grade 1 diastolic dysfunction.  2. Left ventricular ejection fraction, by estimation, is 60 to 65%. The left ventricle has normal function. The left ventricle has no regional wall motion abnormalities. Left ventricular diastolic parameters are consistent with Grade I diastolic dysfunction (impaired relaxation).  3. Right ventricular systolic function is normal. The right ventricular size is normal.  4. The mitral valve is normal in  structure. Trivial mitral valve regurgitation. No evidence of mitral stenosis.  5. The aortic valve is tricuspid. Aortic valve regurgitation is not visualized. No aortic stenosis is present.  6. The inferior vena cava is normal in size with greater than 50% respiratory variability, suggesting right atrial pressure of 3 mmHg. FINDINGS  Left Ventricle: Left ventricular ejection fraction, by estimation, is 60 to 65%. The left ventricle has normal function. The left ventricle has no regional wall motion abnormalities. The left ventricular internal cavity size was normal in size. There is  no left ventricular hypertrophy. Left ventricular diastolic parameters are consistent with Grade I diastolic dysfunction (impaired relaxation). Right Ventricle: The right ventricular size is normal. Right ventricular systolic function is normal. Left Atrium: Left atrial size was normal in size. Right Atrium: Right atrial size was normal in size. Pericardium: There is no evidence of pericardial effusion. Mitral Valve: The mitral valve is normal in structure. Normal mobility of the mitral valve leaflets. Trivial mitral valve regurgitation. No evidence of mitral valve stenosis. Tricuspid Valve: The tricuspid valve is normal in structure. Tricuspid valve regurgitation is trivial. No evidence of tricuspid stenosis. Aortic Valve: The aortic valve is tricuspid. Aortic valve regurgitation is not visualized. No aortic stenosis is present. Aortic valve mean gradient measures 4.0 mmHg. Aortic valve peak gradient measures 8.0 mmHg. Aortic valve area, by VTI measures 2.11 cm. Pulmonic Valve: The pulmonic valve was normal in structure. Pulmonic valve regurgitation is not visualized. No evidence of pulmonic stenosis. Aorta: The aortic root is normal in size and structure. Venous: The inferior vena cava is normal in size with greater than 50% respiratory variability, suggesting right atrial pressure of 3 mmHg.  Additional Comments: Normal LV systolic  function; grade 1 diastolic dysfunction.  LEFT VENTRICLE PLAX 2D LVIDd:         3.90 cm  Diastology LVIDs:         2.60 cm  LV e' lateral:   10.80 cm/s LV PW:         0.90 cm  LV E/e' lateral: 8.6 LV IVS:        0.90 cm  LV e' medial:    7.07 cm/s LVOT diam:     1.90 cm  LV E/e' medial:  13.2 LV SV:         72 LV SV Index:   52 LVOT Area:     2.84 cm  RIGHT VENTRICLE RV S prime:     12.70 cm/s TAPSE (M-mode): 2.2 cm  LEFT ATRIUM             Index LA diam:        2.80 cm 2.01 cm/m LA Vol (A2C):   35.4 ml 25.35 ml/m LA Vol (A4C):   38.9 ml 27.86 ml/m LA Biplane Vol: 39.7 ml 28.43 ml/m  AORTIC VALVE AV Area (Vmax):    2.27 cm AV Area (Vmean):   2.26 cm AV Area (VTI):     2.11 cm AV Vmax:           141.00 cm/s AV Vmean:          97.700 cm/s AV VTI:            0.342 m AV Peak Grad:      8.0 mmHg AV Mean Grad:      4.0 mmHg LVOT Vmax:         113.00 cm/s LVOT Vmean:        78.000 cm/s LVOT VTI:          0.255 m LVOT/AV VTI ratio: 0.75  AORTA Ao Root diam: 2.90 cm MITRAL VALVE MV Area (PHT): 2.66 cm    SHUNTS MV Decel Time: 285 msec    Systemic VTI:  0.26 m MV E velocity: 93.10 cm/s  Systemic Diam: 1.90 cm MV A velocity: 99.90 cm/s MV E/A ratio:  0.93 Kirk Ruths MD Electronically signed by Kirk Ruths MD Signature Date/Time: 05/05/2019/2:17:46 PM    Final      The results of significant diagnostics from this hospitalization (including imaging, microbiology, ancillary and laboratory) are listed below for reference.     Microbiology: Recent Results (from the past 240 hour(s))  Urine culture     Status: Abnormal   Collection Time: 05/04/19  5:22 PM   Specimen: Urine, Random  Result Value Ref Range Status   Specimen Description URINE, RANDOM  Final   Special Requests   Final    NONE Performed at [facility], [address], [place], [place] [address]    Culture (A)  Final    70,000 COLONIES/mL ESCHERICHIA COLI Confirmed Extended Spectrum Beta-Lactamase Producer (ESBL).  In bloodstream  infections from ESBL organisms, carbapenems are preferred over piperacillin/tazobactam. They are shown to have a lower risk of mortality.    Report Status 05/06/2019 FINAL  Final   Organism ID, Bacteria ESCHERICHIA COLI (A)  Final      Susceptibility   Escherichia coli - MIC*    AMPICILLIN >=32 RESISTANT Resistant     CEFAZOLIN >=64 RESISTANT Resistant     CEFTRIAXONE >=64 RESISTANT Resistant     CIPROFLOXACIN <=0.25 SENSITIVE Sensitive     GENTAMICIN <=1 SENSITIVE Sensitive     IMIPENEM <=0.25 SENSITIVE Sensitive     NITROFURANTOIN <=16 SENSITIVE Sensitive     TRIMETH/SULFA <=20 SENSITIVE Sensitive     AMPICILLIN/SULBACTAM >=32 RESISTANT Resistant     PIP/TAZO <=4 SENSITIVE Sensitive     * 70,000 COLONIES/mL ESCHERICHIA COLI  SARS CORONAVIRUS 2 (TAT 6-24 HRS) Nasopharyngeal Nasopharyngeal Swab     Status: None   Collection Time: 05/04/19 10:46 PM   Specimen: Nasopharyngeal Swab  Result Value Ref Range Status   SARS Coronavirus 2 NEGATIVE NEGATIVE Final    Comment: (NOTE) SARS-CoV-2 target nucleic acids are NOT DETECTED. The SARS-CoV-2 RNA is generally detectable in upper and lower respiratory specimens during the acute phase of infection. Negative results do not preclude SARS-CoV-2 infection, do not rule out co-infections with other pathogens, and should not be used  as the sole basis for treatment or other patient management decisions. Negative results must be combined with clinical observations, patient history, and epidemiological information. The expected result is Negative. Fact Sheet for Patients: SugarRoll.be Fact Sheet for Healthcare Providers: https://www.woods-mathews.com/ This test is not yet approved or cleared by the [place] FDA and  has been authorized for detection and/or diagnosis of SARS-CoV-2 by FDA under an Emergency Use Authorization (EUA). This EUA will remain  in effect (meaning this test can be used) for the  duration of the COVID-19 declaration under Section 56 4(b)(1) of the Act, 21 U.S.C. section 360bbb-3(b)(1), unless the authorization is terminated or revoked sooner. Performed at [facility], [place] [address] [place], [place] [address]      Labs: BNP (last 3 results) Recent Labs    05/06/19 1027  BNP [id number]*   Basic Metabolic Panel: Recent Labs  Lab 05/07/19 0408 05/07/19 1531 05/08/19 0336 05/08/19 0847 05/09/19 0227 05/10/19 0357 05/11/19 0426  NA 139   < > 140 139 141 140 139  K 2.7*   < > 4.2 4.7 3.8 3.8 3.4*  CL 99   < > 104 103 104 102 102  CO2 23   < > 22 22 25 27 26   GLUCOSE 70   < > 104* 106* 99 101* 108*  BUN 6*   < > 12 13 13 13 14   CREATININE 0.79   < > 0.77 0.87 0.67 0.75 0.73  CALCIUM 9.0   < > 9.4 9.5 9.3 9.2 9.0  MG 1.8  --  1.8  --  1.7 1.8 1.8  PHOS 3.5  --  2.8  --  3.5 4.1 4.6   < > = values in this interval not displayed.   Liver Function Tests: Recent Labs  Lab 05/04/19 1536 05/08/19 0847  AST 25 27  ALT 16 21  ALKPHOS 81 74  BILITOT 1.0 0.9  PROT 6.9 7.3  ALBUMIN 4.1 4.0   Recent Labs  Lab 05/04/19 1536 05/07/19 1531  LIPASE 26 28   No results for input(s): AMMONIA in the last 168 hours. CBC: Recent Labs  Lab 05/04/19 1536 05/08/19 0847  WBC 10.0 7.6  NEUTROABS 8.0*  --   HGB 14.0 14.1  HCT 44.8 44.4  MCV 90.1 89.7  PLT 310 307   Cardiac Enzymes: No results for input(s): CKTOTAL, CKMB, CKMBINDEX, TROPONINI in the last 168 hours. BNP: Invalid input(s): POCBNP CBG: No results for input(s): GLUCAP in the last 168 hours. D-Dimer No results for input(s): DDIMER in the last 72 hours. Hgb A1c No results for input(s): HGBA1C in the last 72 hours. Lipid Profile No results for input(s): CHOL, HDL, LDLCALC, TRIG, CHOLHDL, LDLDIRECT in the last 72 hours. Thyroid function studies No results for input(s): TSH, T4TOTAL, T3FREE, THYROIDAB in the last 72 hours.  Invalid input(s): FREET3 Anemia work up No results for  input(s): VITAMINB12, FOLATE, FERRITIN, TIBC, IRON, RETICCTPCT in the last 72 hours. Urinalysis    Component Value Date/Time   COLORURINE YELLOW 05/04/2019 1730   APPEARANCEUR HAZY (A) 05/04/2019 1730   LABSPEC 1.024 05/04/2019 1730   PHURINE 5.0 05/04/2019 1730   GLUCOSEU NEGATIVE 05/04/2019 1730   HGBUR NEGATIVE 05/04/2019 1730   BILIRUBINUR NEGATIVE 05/04/2019 1730   KETONESUR 80 (A) 05/04/2019 1730   PROTEINUR 100 (A) 05/04/2019 1730   NITRITE NEGATIVE 05/04/2019 1730   LEUKOCYTESUR NEGATIVE 05/04/2019 1730   Sepsis Labs Invalid input(s): PROCALCITONIN,  WBC,  LACTICIDVEN Microbiology Recent Results (  from the past 240 hour(s))  Urine culture     Status: Abnormal   Collection Time: 05/04/19  5:22 PM   Specimen: Urine, Random  Result Value Ref Range Status   Specimen Description URINE, RANDOM  Final   Special Requests   Final    NONE Performed at [facility], [address], [place], [place] [address]    Culture (A)  Final    70,000 COLONIES/mL ESCHERICHIA COLI Confirmed Extended Spectrum Beta-Lactamase Producer (ESBL).  In bloodstream infections from ESBL organisms, carbapenems are preferred over piperacillin/tazobactam. They are shown to have a lower risk of mortality.    Report Status 05/06/2019 FINAL  Final   Organism ID, Bacteria ESCHERICHIA COLI (A)  Final      Susceptibility   Escherichia coli - MIC*    AMPICILLIN >=32 RESISTANT Resistant     CEFAZOLIN >=64 RESISTANT Resistant     CEFTRIAXONE >=64 RESISTANT Resistant     CIPROFLOXACIN <=0.25 SENSITIVE Sensitive     GENTAMICIN <=1 SENSITIVE Sensitive     IMIPENEM <=0.25 SENSITIVE Sensitive     NITROFURANTOIN <=16 SENSITIVE Sensitive     TRIMETH/SULFA <=20 SENSITIVE Sensitive     AMPICILLIN/SULBACTAM >=32 RESISTANT Resistant     PIP/TAZO <=4 SENSITIVE Sensitive     * 70,000 COLONIES/mL ESCHERICHIA COLI  SARS CORONAVIRUS 2 (TAT 6-24 HRS) Nasopharyngeal Nasopharyngeal Swab     Status: None   Collection  Time: 05/04/19 10:46 PM   Specimen: Nasopharyngeal Swab  Result Value Ref Range Status   SARS Coronavirus 2 NEGATIVE NEGATIVE Final    Comment: (NOTE) SARS-CoV-2 target nucleic acids are NOT DETECTED. The SARS-CoV-2 RNA is generally detectable in upper and lower respiratory specimens during the acute phase of infection. Negative results do not preclude SARS-CoV-2 infection, do not rule out co-infections with other pathogens, and should not be used as the sole basis for treatment or other patient management decisions. Negative results must be combined with clinical observations, patient history, and epidemiological information. The expected result is Negative. Fact Sheet for Patients: SugarRoll.be Fact Sheet for Healthcare Providers: https://www.woods-mathews.com/ This test is not yet approved or cleared by the [place] FDA and  has been authorized for detection and/or diagnosis of SARS-CoV-2 by FDA under an Emergency Use Authorization (EUA). This EUA will remain  in effect (meaning this test can be used) for the duration of the COVID-19 declaration under Section 56 4(b)(1) of the Act, 21 U.S.C. section 360bbb-3(b)(1), unless the authorization is terminated or revoked sooner. Performed at [facility], [place] [address], [place], [place] [address]      Time coordinating discharge:  I have spent 35 minutes face to face with the patient and on the ward discussing the patients care, assessment, plan and disposition with other care givers. >50% of the time was devoted counseling the patient about the risks and benefits of treatment/Discharge disposition and coordinating care.   SIGNED:   Deatra James, MD  [facility] 05/11/2019, 8:38 AM   If 7PM-7AM, please contact night-coverage

## 2019-05-11 NOTE — Progress Notes (Signed)
Occupational Therapy Treatment Patient Details Name: Stacie [name] MRN: [id number] DOB: [birth year] Today's Date: 05/11/2019    History of present illness 64 yo female admitted with reports of BIL LE weakness, BP 201/90, hypokalemia CT parenchymal atrophy and chronice small vess ischemica disease. 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 Patient's session encompassed ADLs, functional mobility, and higher level cognitive tasks. Pt continues to remain impulsive in completion of tasks, but pleasantly heeds cues for thoroughness. Pt perseverative on going home, but is eager and motivated to complete inpatient rehab. Pt educated on BE FAST acronym with verbal understanding noted (reiteration may be beneficial). Pt would continue to make an excellent candidate for CIR in order to address higher level cognitive tasks and IADLs to promote functional independence upon discharge; will continue to follow acutely.    Follow Up Recommendations  CIR    Equipment Recommendations  3 in 1 bedside commode    Recommendations for Other Services      Precautions / Restrictions Precautions Precautions: Fall Restrictions Weight Bearing Restrictions: No       Mobility Bed Mobility Overal bed mobility: Independent             General bed mobility comments: sitting EOB on arrival  Transfers Overall transfer level: Needs assistance Equipment used: Rolling walker (2 wheeled) Transfers: Sit to/from Stand Sit to Stand: Min guard         General transfer  comment: ataxic gait and impaired balance on L noted requiring increased assistance with RW    Balance Overall balance assessment: Needs assistance Sitting-balance support: Bilateral upper extremity supported Sitting balance-Leahy Scale: Good     Standing balance support: Bilateral upper extremity supported;During functional activity Standing balance-Leahy Scale: Poor                             ADL either performed or assessed with clinical judgement   ADL Overall ADL's : Needs assistance/impaired     Grooming: Wash/dry face;Oral care;Wash/dry hands;Standing;Cueing for safety;Min guard Grooming Details (indicate cue type and reason): Able to stand at sink min gaurd, however requires cues for thoughness and walker management                 Toilet Transfer: Min guard;Ambulation Toilet Transfer Details (indicate cue type and reason): Simualted via sit to stands from sink back to EOB         Functional mobility during ADLs: Min guard;Cueing for safety;Cueing for sequencing General ADL Comments: Pt remains impulsive but cooperative, requiring cues for safety as well as throughness to complete functional tasks     Vision Baseline Vision/History: Wears glasses Wears Glasses: Reading only     Perception     Praxis      Cognition Arousal/Alertness: Awake/alert Behavior During Therapy: WFL for tasks assessed/performed Overall Cognitive Status: Impaired/Different from baseline Area of Impairment: Safety/judgement;Problem solving;Following commands                       Following Commands:  Follows multi-step commands inconsistently;Follows one step commands consistently Safety/Judgement: Decreased awareness of deficits;Decreased awareness of safety Awareness: Emergent Problem Solving: Requires verbal cues;Difficulty sequencing General Comments: patient pleasant but motivated to go home        Exercises     Shoulder Instructions        General Comments      Pertinent Vitals/ Pain       Pain Assessment: No/denies pain  Home Living                                          Prior Functioning/Environment              Frequency  Min 3X/week        Progress Toward Goals  OT Goals(current goals can now be found in the care plan section)  Progress towards OT goals: Progressing toward goals  Acute Rehab OT Goals Patient Stated Goal: to go home OT Goal Formulation: With patient Potential to Achieve Goals: Good  Plan Discharge plan remains appropriate    Co-evaluation          OT goals addressed during session: ADL's and self-care;Strengthening/ROM      AM-PAC OT "6 Clicks" Daily Activity     Outcome Measure   Help from another person eating meals?: A Little Help from another person taking care of personal grooming?: A Little Help from another person toileting, which includes using toliet, bedpan, or urinal?: A Little Help from another person bathing (including washing, rinsing, drying)?: A Little Help from another person to put on and taking off regular upper body clothing?: A Little Help from another person to put on and taking off regular lower body clothing?: A Little 6 Click Score: 18    End of Session Equipment Utilized During Treatment: Rolling walker  OT Visit Diagnosis: Unsteadiness on feet (R26.81);Muscle weakness (generalized) (M62.81)   Activity Tolerance Patient tolerated treatment well   Patient Left in bed;with call bell/phone within reach;with bed alarm set   Nurse Communication Mobility status;Precautions        Time: [id number] OT Time Calculation (min): 14 min  Charges: OT General Charges $OT Visit: 1 Visit OT Treatments $Self Care/Home Management : 8-22 mins Corinne Ports E. Cambree Hendrix, COTA/L Acute Rehabilitation Services [place] 05/11/2019, 10:48 AM

## 2019-05-11 NOTE — Progress Notes (Signed)
Inpatient Rehabilitation-Admissions Coordinator   I have received insurance approval for admit to CIR. I do not have a bed available today but will follow up tomorrow for possible admit, pending bed availability.   Please call if questions.   Raechel Ache, OTR/L  Rehab Admissions Coordinator  [phone] 05/11/2019 1:52 PM

## 2019-05-12 ENCOUNTER — Encounter (HOSPITAL_COMMUNITY): Payer: Self-pay | Admitting: Physical Medicine & Rehabilitation

## 2019-05-12 ENCOUNTER — Inpatient Hospital Stay (HOSPITAL_COMMUNITY)
Admission: RE | Admit: 2019-05-12 | Discharge: 2019-05-19 | DRG: 056 | Disposition: A | Discharge status: Home / Self Care | Payer: Medicare Other | Source: Intra-hospital | Attending: Physical Medicine and Rehabilitation | Admitting: Physical Medicine and Rehabilitation

## 2019-05-12 ENCOUNTER — Other Ambulatory Visit: Payer: Self-pay

## 2019-05-12 DIAGNOSIS — F329 Major depressive disorder, single episode, unspecified: Secondary | ICD-10-CM | POA: Diagnosis present

## 2019-05-12 DIAGNOSIS — I69322 Dysarthria following cerebral infarction: Secondary | ICD-10-CM

## 2019-05-12 DIAGNOSIS — K5900 Constipation, unspecified: Secondary | ICD-10-CM | POA: Diagnosis not present

## 2019-05-12 DIAGNOSIS — N390 Urinary tract infection, site not specified: Secondary | ICD-10-CM | POA: Diagnosis present

## 2019-05-12 DIAGNOSIS — R110 Nausea: Secondary | ICD-10-CM | POA: Diagnosis not present

## 2019-05-12 DIAGNOSIS — E43 Unspecified severe protein-calorie malnutrition: Secondary | ICD-10-CM | POA: Diagnosis present

## 2019-05-12 DIAGNOSIS — I739 Peripheral vascular disease, unspecified: Secondary | ICD-10-CM | POA: Diagnosis not present

## 2019-05-12 DIAGNOSIS — X58XXXD Exposure to other specified factors, subsequent encounter: Secondary | ICD-10-CM | POA: Diagnosis present

## 2019-05-12 DIAGNOSIS — I639 Cerebral infarction, unspecified: Secondary | ICD-10-CM | POA: Diagnosis not present

## 2019-05-12 DIAGNOSIS — E782 Mixed hyperlipidemia: Secondary | ICD-10-CM

## 2019-05-12 DIAGNOSIS — Z885 Allergy status to narcotic agent status: Secondary | ICD-10-CM | POA: Diagnosis not present

## 2019-05-12 DIAGNOSIS — I69391 Dysphagia following cerebral infarction: Secondary | ICD-10-CM | POA: Diagnosis not present

## 2019-05-12 DIAGNOSIS — F1910 Other psychoactive substance abuse, uncomplicated: Secondary | ICD-10-CM

## 2019-05-12 DIAGNOSIS — Z7151 Drug abuse counseling and surveillance of drug abuser: Secondary | ICD-10-CM

## 2019-05-12 DIAGNOSIS — Z681 Body mass index (BMI) 19 or less, adult: Secondary | ICD-10-CM | POA: Diagnosis not present

## 2019-05-12 DIAGNOSIS — S51811D Laceration without foreign body of right forearm, subsequent encounter: Secondary | ICD-10-CM

## 2019-05-12 DIAGNOSIS — Z888 Allergy status to other drugs, medicaments and biological substances status: Secondary | ICD-10-CM | POA: Diagnosis not present

## 2019-05-12 DIAGNOSIS — S51812D Laceration without foreign body of left forearm, subsequent encounter: Secondary | ICD-10-CM | POA: Diagnosis not present

## 2019-05-12 DIAGNOSIS — G4700 Insomnia, unspecified: Secondary | ICD-10-CM | POA: Diagnosis not present

## 2019-05-12 DIAGNOSIS — R1310 Dysphagia, unspecified: Secondary | ICD-10-CM | POA: Diagnosis present

## 2019-05-12 DIAGNOSIS — I10 Essential (primary) hypertension: Secondary | ICD-10-CM | POA: Diagnosis not present

## 2019-05-12 DIAGNOSIS — Z79899 Other long term (current) drug therapy: Secondary | ICD-10-CM | POA: Diagnosis not present

## 2019-05-12 DIAGNOSIS — F17210 Nicotine dependence, cigarettes, uncomplicated: Secondary | ICD-10-CM | POA: Diagnosis present

## 2019-05-12 DIAGNOSIS — E785 Hyperlipidemia, unspecified: Secondary | ICD-10-CM | POA: Diagnosis not present

## 2019-05-12 DIAGNOSIS — I69354 Hemiplegia and hemiparesis following cerebral infarction affecting left non-dominant side: Principal | ICD-10-CM

## 2019-05-12 DIAGNOSIS — E876 Hypokalemia: Secondary | ICD-10-CM | POA: Diagnosis not present

## 2019-05-12 DIAGNOSIS — Z823 Family history of stroke: Secondary | ICD-10-CM | POA: Diagnosis not present

## 2019-05-12 DIAGNOSIS — Z8249 Family history of ischemic heart disease and other diseases of the circulatory system: Secondary | ICD-10-CM | POA: Diagnosis not present

## 2019-05-12 DIAGNOSIS — F3342 Major depressive disorder, recurrent, in full remission: Secondary | ICD-10-CM

## 2019-05-12 DIAGNOSIS — F411 Generalized anxiety disorder: Secondary | ICD-10-CM

## 2019-05-12 DIAGNOSIS — F1290 Cannabis use, unspecified, uncomplicated: Secondary | ICD-10-CM

## 2019-05-12 HISTORY — DX: Cerebral infarction, unspecified: I63.9

## 2019-05-12 LAB — PHOSPHORUS: Phosphorus: 4.3 mg/dL (ref 2.5–4.6)

## 2019-05-12 LAB — BASIC METABOLIC PANEL
Anion gap: 12 (ref 5–15)
BUN: 15 mg/dL (ref 8–23)
CO2: 27 mmol/L (ref 22–32)
Calcium: 9.2 mg/dL (ref 8.9–10.3)
Chloride: 102 mmol/L (ref 98–111)
Creatinine, Ser: 0.76 mg/dL (ref 0.44–1.00)
GFR calc Af Amer: >60 mL/min (ref >60)
GFR calc non Af Amer: >60 mL/min (ref >60)
Glucose, Bld: 103 mg/dL — ABNORMAL HIGH (ref 70–99)
Potassium: 3.4 mmol/L — ABNORMAL LOW (ref 3.5–5.1)
Sodium: 141 mmol/L (ref 135–145)

## 2019-05-12 LAB — CREATININE, SERUM
Creatinine, Ser: 0.8 mg/dL (ref 0.44–1.00)
GFR calc Af Amer: >60 mL/min (ref >60)
GFR calc non Af Amer: >60 mL/min (ref >60)

## 2019-05-12 LAB — MAGNESIUM: Magnesium: 1.9 mg/dL (ref 1.7–2.4)

## 2019-05-12 LAB — CBC
HCT: 43.7 % (ref 36.0–46.0)
Hemoglobin: 14 g/dL (ref 12.0–15.0)
MCH: 28.6 pg (ref 26.0–34.0)
MCHC: 32 g/dL (ref 30.0–36.0)
MCV: 89.2 fL (ref 80.0–100.0)
Platelets: 361 10*3/uL (ref 150–400)
RBC: 4.9 MIL/uL (ref 3.87–5.11)
RDW: 13.6 % (ref 11.5–15.5)
WBC: 8.1 10*3/uL (ref 4.0–10.5)
nRBC: 0 % (ref 0.0–0.2)

## 2019-05-12 MED ORDER — CIPROFLOXACIN HCL 500 MG PO TABS: 500.0000 mg | ORAL_TABLET | Freq: Two times a day (BID) | ORAL | 0 refills | Status: DC

## 2019-05-12 MED ORDER — SENNOSIDES-DOCUSATE SODIUM 8.6-50 MG PO TABS: 1.0000 | ORAL_TABLET | Freq: Every evening | ORAL | Status: DC | PRN

## 2019-05-12 MED ORDER — ENOXAPARIN SODIUM 30 MG/0.3ML ~~LOC~~ SOLN
30.0000 mg | SUBCUTANEOUS | Status: DC
  Administered 2019-05-13 – 2019-05-14 (×2): 30 mg via SUBCUTANEOUS
  Filled 2019-05-12 (×4): qty 0.3

## 2019-05-12 MED ORDER — AMLODIPINE BESYLATE 10 MG PO TABS
10.0000 mg | ORAL_TABLET | Freq: Every day | ORAL | Status: DC
  Administered 2019-05-13 – 2019-05-19 (×7): 10 mg via ORAL
  Filled 2019-05-12 (×7): qty 1

## 2019-05-12 MED ORDER — NYSTATIN 100000 UNIT/ML MT SUSP
5.0000 mL | Freq: Three times a day (TID) | OROMUCOSAL | Status: DC
  Administered 2019-05-12 – 2019-05-19 (×20): 500000 [IU] via ORAL
  Filled 2019-05-12 (×19): qty 5

## 2019-05-12 MED ORDER — THIAMINE HCL 100 MG PO TABS
100.0000 mg | ORAL_TABLET | Freq: Every day | ORAL | Status: DC
  Administered 2019-05-13 – 2019-05-19 (×7): 100 mg via ORAL
  Filled 2019-05-12 (×7): qty 1

## 2019-05-12 MED ORDER — ENSURE ENLIVE PO LIQD: 237.0000 mL | Freq: Three times a day (TID) | ORAL | Status: DC

## 2019-05-12 MED ORDER — CLOPIDOGREL BISULFATE 75 MG PO TABS
75.0000 mg | ORAL_TABLET | Freq: Every day | ORAL | Status: DC
  Administered 2019-05-13 – 2019-05-19 (×7): 75 mg via ORAL
  Filled 2019-05-12 (×7): qty 1

## 2019-05-12 MED ORDER — ACETAMINOPHEN 325 MG PO TABS
650.0000 mg | ORAL_TABLET | ORAL | Status: DC | PRN
  Administered 2019-05-17 – 2019-05-18 (×2): 650 mg via ORAL
  Filled 2019-05-12 (×2): qty 2

## 2019-05-12 MED ORDER — ACETAMINOPHEN 650 MG RE SUPP: 650.0000 mg | RECTAL | Status: DC | PRN

## 2019-05-12 MED ORDER — ESCITALOPRAM OXALATE 10 MG PO TABS
20.0000 mg | ORAL_TABLET | Freq: Every day | ORAL | Status: DC
  Administered 2019-05-13 – 2019-05-19 (×7): 20 mg via ORAL
  Filled 2019-05-12 (×7): qty 2

## 2019-05-12 MED ORDER — TRAZODONE HCL 50 MG PO TABS
50.0000 mg | ORAL_TABLET | Freq: Every evening | ORAL | Status: DC | PRN
  Administered 2019-05-12 – 2019-05-18 (×4): 50 mg via ORAL
  Filled 2019-05-12 (×5): qty 1

## 2019-05-12 MED ORDER — ENOXAPARIN SODIUM 40 MG/0.4ML ~~LOC~~ SOLN: 40.0000 mg | SUBCUTANEOUS | Status: DC

## 2019-05-12 MED ORDER — ENSURE ENLIVE PO LIQD
237.0000 mL | Freq: Two times a day (BID) | ORAL | Status: DC
  Administered 2019-05-13 – 2019-05-15 (×4): 237 mL via ORAL

## 2019-05-12 MED ORDER — FOLIC ACID 1 MG PO TABS
1.0000 mg | ORAL_TABLET | Freq: Every day | ORAL | Status: DC
  Administered 2019-05-13 – 2019-05-19 (×7): 1 mg via ORAL
  Filled 2019-05-12 (×7): qty 1

## 2019-05-12 MED ORDER — HYDRALAZINE HCL 50 MG PO TABS
50.0000 mg | ORAL_TABLET | Freq: Three times a day (TID) | ORAL | Status: DC
  Administered 2019-05-12 – 2019-05-13 (×3): 50 mg via ORAL
  Filled 2019-05-12 (×3): qty 1

## 2019-05-12 MED ORDER — SORBITOL 70 % SOLN: 30.0000 mL | Freq: Every day | Status: DC | PRN

## 2019-05-12 MED ORDER — ASPIRIN EC 81 MG PO TBEC
81.0000 mg | DELAYED_RELEASE_TABLET | Freq: Every day | ORAL | Status: DC
  Administered 2019-05-13 – 2019-05-19 (×7): 81 mg via ORAL
  Filled 2019-05-12 (×7): qty 1

## 2019-05-12 MED ORDER — ACETAMINOPHEN 160 MG/5ML PO SOLN: 650.0000 mg | ORAL | Status: DC | PRN

## 2019-05-12 MED ORDER — SIMVASTATIN 20 MG PO TABS
20.0000 mg | ORAL_TABLET | Freq: Every day | ORAL | Status: DC
  Administered 2019-05-12 – 2019-05-18 (×7): 20 mg via ORAL
  Filled 2019-05-12 (×7): qty 1

## 2019-05-12 MED ORDER — THIAMINE HCL 100 MG/ML IJ SOLN
100.0000 mg | Freq: Every day | INTRAMUSCULAR | Status: DC
  Filled 2019-05-12 (×3): qty 1

## 2019-05-12 MED ORDER — DOXYLAMINE SUCCINATE (SLEEP) 25 MG PO TABS
25.0000 mg | ORAL_TABLET | Freq: Every evening | ORAL | Status: DC | PRN
  Administered 2019-05-15 – 2019-05-17 (×3): 25 mg via ORAL
  Filled 2019-05-12 (×5): qty 1

## 2019-05-12 MED ORDER — ADULT MULTIVITAMIN W/MINERALS CH
1.0000 | ORAL_TABLET | Freq: Every day | ORAL | Status: DC
  Administered 2019-05-13 – 2019-05-19 (×7): 1 via ORAL
  Filled 2019-05-12 (×7): qty 1

## 2019-05-12 MED ORDER — NICOTINE 14 MG/24HR TD PT24
14.0000 mg | MEDICATED_PATCH | Freq: Every day | TRANSDERMAL | Status: DC
  Administered 2019-05-13 – 2019-05-19 (×7): 14 mg via TRANSDERMAL
  Filled 2019-05-12 (×7): qty 1

## 2019-05-12 NOTE — Social Work (Signed)
CSW met with pt at bedside. CSW introduced self and explained her role. CSW completed sbirt with pt. Pt stated that she drinks 2-3 beers almost everyday. Pt denied having a problem with her alcohol use. CSW inquired about substance use. Pt stated that smokes marijuana a few times a week. Pt stated she used to live in [place] and had a prescription for marijuana there. Pt states is helped her eat and controlled her nausea.   Pt stated that she does not have an issue with her alcohol use or marijuana use. Pt stated she does plan on cutting back her alcohol use. Pt has no intentions on stopping marijuana use. CSW offered resources and pt declined resources.   Emeterio Reeve, Latanya Presser, [place] Licensed Clinical Social Worker [phone]

## 2019-05-12 NOTE — Progress Notes (Signed)
Report called to 4W, taking patient up with belongings in wheelchair

## 2019-05-12 NOTE — H&P (Signed)
Physical Medicine and Rehabilitation Admission H&P        Chief Complaint  Patient presents with  . Stroke Symptoms  : HPI: Stacie [name] is a 64 year old right-handed female with history of hypertension, hyperlipidemia, tobacco abuse as well as marijuana.  Patient lives with her ex-husband.  Independent prior to admission.  Two-level home bed and bath on main level 3 steps to entry.  Presented on 05/04/2019 with bilateral lower extremity weakness and difficulty with ambulation with facial droop and dysarthria.  Cranial CT scan age-indeterminate infarcts within the right frontal lobe white matter and right basal ganglia possibly acute or subacute.  MRI showed a 8 mm acute infarction posterior limb internal capsule on the right.  Punctate acute infarct either in the left caudate head or white matter adjacent to the frontal horn of the left lateral ventricle.  8 mm acute to subacute infarct in the white matter adjacent to the atrium of the right lateral ventricle.  Patient did not receive TPA.  CT angiogram head and neck no emergent findings.  Echocardiogram with ejection fraction 65% without emboli.  Admission chemistries unremarkable except potassium 3.3, alcohol negative, urine drug screen positive marijuana, urinalysis greater than 70,000 ESBL.  Maintained on aspirin and Plavix for CVA prophylaxis x3 weeks then aspirin alone.  Subcutaneous Lovenox for DVT prophylaxis.  Therapy evaluations completed and patient was admitted for a comprehensive rehab program.   Review of Systems  Constitutional: Negative for chills and fever.  HENT: Negative for hearing loss.   Eyes: Negative for blurred vision and double vision.  Respiratory: Negative for cough and shortness of breath.   Cardiovascular: Negative for chest pain, palpitations and leg swelling.  Gastrointestinal: Positive for constipation. Negative for heartburn, nausea and vomiting.  Genitourinary: Negative for dysuria, flank pain and  hematuria.  Musculoskeletal: Positive for myalgias.       Reports of falls when she does drink alcohol.  Skin: Negative for rash.  Neurological: Positive for speech change and weakness.  Psychiatric/Behavioral: Positive for depression. The patient has insomnia.   All other systems reviewed and are negative.       Past Medical History:  Diagnosis Date  . Colitis    . Hyperlipidemia    . Hypertension    . Stroke ([place]) 04/2012    unknown type  . Thyroid disease      History reviewed. No pertinent surgical history.      Family History  Problem Relation Age of Onset  . Stroke Mother 28        embolic  . Heart disease Father 69        CAd with CABG, no MI  . Heart disease Sister 55        CAD  . Heart disease Paternal Uncle    . Heart disease Maternal Grandmother    . Heart disease Paternal Grandfather    . Colon cancer Neg Hx    . Esophageal cancer Neg Hx    . Stomach cancer Neg Hx      Social History:  reports that she has been smoking. She has a 2.50 pack-year smoking history. She has never used smokeless tobacco. She reports current alcohol use of about 14.0 standard drinks of alcohol per week. She reports current drug use. Frequency: 7.00 times per week. Drug: Marijuana. Allergies:      Allergies  Allergen Reactions  . Trazodone And Nefazodone Nausea And Vomiting  . Wellbutrin [Bupropion] Nausea And Vomiting  Medications Prior to Admission  Medication Sig Dispense Refill  . amLODipine (NORVASC) 10 MG tablet Take 1 tablet (10 mg total) by mouth daily. 90 tablet 1  . doxylamine, Sleep, (UNISOM) 25 MG tablet Take 25 mg by mouth at bedtime as needed for sleep.       Marland Kitchen escitalopram (LEXAPRO) 20 MG tablet TAKE 1 TABLET(20 MG) BY MOUTH DAILY (Patient taking differently: Take 20 mg by mouth daily. ) 90 tablet 3  . diphenoxylate-atropine (LOMOTIL) 2.5-0.025 MG tablet Take 1 tablet by mouth 4 (four) times daily as needed for diarrhea or loose stools. (Patient not taking:  Reported on 10/06/2018) 100 tablet 1      Drug Regimen Review Drug regimen was reviewed and remains appropriate with no significant issues identified   Home: Home Living Family/patient expects to be discharged to:: Private residence Living Arrangements: Other (Comment)(lived with ex-husband) Available Help at Discharge: Family, Available 24 hours/day Type of Home: House Home Access: Stairs to enter [employer] of Steps: 3 Entrance Stairs-Rails: Can reach both Home Layout: Able to live on main level with bedroom/bathroom Bathroom Shower/Tub: Chiropodist: Standard Home Equipment: None Additional Comments: x2 daughters present reports ex spouse has 3 cats in the house but she does not care for animals   Lives With: Other (Comment)(ex-husband)   Functional History: Prior Function Level of Independence: Independent   Functional Status:  Mobility: Bed Mobility Overal bed mobility: Independent Bed Mobility: Supine to Sit Rolling: Min assist Supine to sit: Min guard Sit to supine: Min assist General bed mobility comments: min guard for safety Transfers Overall transfer level: Needs assistance Equipment used: 1 person hand held assist Transfers: Sit to/from Stand Sit to Stand: Min assist General transfer comment: assist for balance  Ambulation/Gait Ambulation/Gait assistance: Mod assist, Min assist Gait Distance (Feet): 200 Feet Assistive device: 1 person hand held assist(assist at trunk with gait belt) Gait Pattern/deviations: Step-through pattern, Decreased stride length, Decreased stance time - left, Drifts right/left, Narrow base of support General Gait Details: assistance required for balance and weight shifting; continued impaired propriception and R side gaze preference(+2 for line management) Gait velocity: decreased Gait velocity interpretation: <1.31 ft/sec, indicative of household ambulator   ADL: ADL Overall ADL's : Needs  assistance/impaired Eating/Feeding: Moderate assistance, Bed level Eating/Feeding Details (indicate cue type and reason): needed encouragement to visually attend to cup to scoop out ice chips. pt motivated to eat ice so engaged in task.  Grooming: Wash/dry face, Minimal assistance, Bed level Grooming Details (indicate cue type and reason): pt pulling cool wash cloth off face and putting it back in a restless manner Lower Body Dressing Details (indicate cue type and reason): pt declines to don due to urgency and states "no you do it" Toilet Transfer: +2 for physical assistance, Moderate assistance, Ambulation, BSC Toileting- Clothing Manipulation and Hygiene: Moderate assistance, Sitting/lateral lean Functional mobility during ADLs: +2 for physical assistance, Moderate assistance General ADL Comments: pt very restless and overall impulsive at this time. pt needs cues to remain seated and not to pull at leads/ therapists   Cognition: Cognition Overall Cognitive Status: Impaired/Different from baseline Arousal/Alertness: Awake/alert Orientation Level: Oriented X4 Attention: Focused, Sustained Focused Attention: Impaired Focused Attention Impairment: Verbal complex Sustained Attention: Impaired Sustained Attention Impairment: Verbal complex Memory: Impaired Memory Impairment: Retrieval deficit, Storage deficit, Decreased recall of new information(Immediate: 5/5; delayed: 4/5) Awareness: Impaired Awareness Impairment: Emergent impairment Problem Solving: Impaired Problem Solving Impairment: Verbal complex Executive Function: Reasoning Reasoning: Impaired Reasoning Impairment: Verbal complex  Cognition Arousal/Alertness: Awake/alert Behavior During Therapy: WFL for tasks assessed/performed Overall Cognitive Status: Impaired/Different from baseline Area of Impairment: Safety/judgement, Problem solving, Following commands Following Commands: Follows multi-step commands inconsistently,  Follows one step commands consistently Safety/Judgement: Decreased awareness of deficits, Decreased awareness of safety Awareness: Emergent Problem Solving: Requires verbal cues, Difficulty sequencing General Comments: patient with impulsivity and axiety with mobility due to unsteadiness and nausea.     Physical Exam: Blood pressure (!) 190/94, pulse 69, temperature 98.2 F (36.8 C), temperature source Oral, resp. rate 18, height 5\' 4"  (1.626 m), SpO2 98 %. Physical Exam  Constitutional: She is oriented to person, place, and time. No distress.  HENT:  Head: Atraumatic.  Mouth/Throat: Oropharynx is clear and moist. No oropharyngeal exudate.  Eyes: Pupils are equal, round, and reactive to light. Conjunctivae are normal. Right eye exhibits no discharge. Left eye exhibits no discharge.  Neck: No tracheal deviation present. No thyromegaly present.  Cardiovascular: Normal rate and regular rhythm. Exam reveals no friction rub.  No murmur heard. Respiratory: Effort normal. No respiratory distress. She has no wheezes. She has no rales.  GI: Soft. She exhibits no distension. There is no abdominal tenderness.  Musculoskeletal:        General: No tenderness, deformity or edema.     Cervical back: Normal range of motion.  Neurological: She is alert and oriented to person, place, and time.   Left central 7 and 12. Speech clear. Normal language. Reasonable insight and awareness. Mild left inattention. RUE and RLE 4+/5. LUE 4-/5 prox to distal. LLE 4/5. Normal LT and PP in all 4 limbs. DTR's 1+  Skin: Skin is warm and dry. She is not diaphoretic.  Healing,scabbed abrasions bilateral forearms with foam dressings in place  Psychiatric: She has a normal mood and affect. Her behavior is normal. Judgment and thought content normal.      Lab Results Last 48 Hours        Results for orders placed or performed during the hospital encounter of 05/04/19 (from the past 48 hour(s))  Basic metabolic panel      Status: Abnormal    Collection Time: 05/10/19  3:57 AM  Result Value Ref Range    Sodium 140 135 - 145 mmol/L    Potassium 3.8 3.5 - 5.1 mmol/L    Chloride 102 98 - 111 mmol/L    CO2 27 22 - 32 mmol/L    Glucose, Bld 101 (H) 70 - 99 mg/dL      Comment: Glucose reference range applies only to samples taken after fasting for at least 8 hours.    BUN 13 8 - 23 mg/dL    Creatinine, Ser 0.75 0.44 - 1.00 mg/dL    Calcium 9.2 8.9 - 10.3 mg/dL    GFR calc non Af Amer >60 >60 mL/min    GFR calc Af Amer >60 >60 mL/min    Anion gap 11 5 - 15      Comment: Performed at [facility] [address] [place], [place] [address]  Magnesium     Status: None    Collection Time: 05/10/19  3:57 AM  Result Value Ref Range    Magnesium 1.8 1.7 - 2.4 mg/dL      Comment: Performed at [facility] [address], [place], [place] [address]  Phosphorus     Status: None    Collection Time: 05/10/19  3:57 AM  Result Value Ref Range    Phosphorus 4.1 2.5 -  4.6 mg/dL      Comment: Performed at [facility], [place] [address], [place], [place] [id number]  Basic metabolic panel     Status: Abnormal    Collection Time: 05/11/19  4:26 AM  Result Value Ref Range    Sodium 139 135 - 145 mmol/L    Potassium 3.4 (L) 3.5 - 5.1 mmol/L    Chloride 102 98 - 111 mmol/L    CO2 26 22 - 32 mmol/L    Glucose, Bld 108 (H) 70 - 99 mg/dL      Comment: Glucose reference range applies only to samples taken after fasting for at least 8 hours.    BUN 14 8 - 23 mg/dL    Creatinine, Ser 0.73 0.44 - 1.00 mg/dL    Calcium 9.0 8.9 - 10.3 mg/dL    GFR calc non Af Amer >60 >60 mL/min    GFR calc Af Amer >60 >60 mL/min    Anion gap 11 5 - 15      Comment: Performed at [facility] [address] [place], [place] [address]  Magnesium     Status: None    Collection Time: 05/11/19  4:26 AM  Result Value Ref Range    Magnesium 1.8 1.7 - 2.4 mg/dL      Comment: Performed at [facility] [address] [place], [place] [address]  Phosphorus     Status: None    Collection Time: 05/11/19  4:26 AM  Result Value Ref Range    Phosphorus 4.6 2.5 - 4.6 mg/dL      Comment: Performed at [facility] [address], [place], [place] [address]      Imaging Results (Last 48 hours)  No results found.           Medical Problem List and Plan: 1.  Left-sided weakness with dysarthria secondary to multiple small subcortical infarcts right punctate, left caudate secondary to small vessel disease             -patient may shower             -ELOS/Goals: 7-10 days, supervision to mod I 2.  Antithrombotics: -DVT/anticoagulation: Lovenox             -antiplatelet therapy: Aspirin 81 mg daily and Plavix 75 mg daily x3 weeks then aspirin alone 3. Pain Management: Tylenol as needed 4. Mood: Lexapro 20 mg daily Unisom for sleep             -antipsychotic agents: N/A             -in good spirits at present, team to provide ego support as needed. 5. Neuropsych: This patient is capable of making decisions on her own behalf. 6. Skin/Wound Care: Routine skin checks 7. Fluids/Electrolytes/Nutrition: Routine in and outs with follow-up chemistries on Monday.  8.  Hypertension.  Norvasc 10 mg daily, hydralazine 50 mg every 8 hours.                -SBP elevated at present, follow for pattern and adjust regimen as needed 9.  History of alcohol/tobacco/marijuana use.  Urine drug screen positive marijuana.  Continue NicoDerm patch.  Provide counseling 10.  Hyperlipidemia.  Zocor 11.  UTI ESBL.  Contact precautions ongoing         Cathlyn Parsons, PA-C 05/11/2019   I have personally performed a face to face diagnostic evaluation of this patient and formulated the key components  of the plan.  Additionally, I have personally reviewed laboratory data, imaging studies, as well as relevant notes and concur with the physician assistant's documentation above.  The patient's status has not changed from the  original H&P.  Any changes in documentation from the acute care chart have been noted above.  Meredith Staggers, MD, Mell[facility] Drown

## 2019-05-12 NOTE — Discharge Summary (Signed)
Physician Discharge Summary  Stacie [name] [id number] DOB: 09/24/1955 DOA: 05/04/2019  PCP: [name], NP  Admit date: 05/04/2019 Discharge date: 05/12/2019  Admitted From: Home Disposition: CIR  The patient was seen and examined this morning, stable cleared for discharge Urine culture come back with multi resistant ESBL-was decided the patient needs to be treated with antibiotic ciprofloxacin for total of 10 days. Repeat UA if patient is further symptomatic  Otherwise no further changes... Cleared for discharge to CIR   Recommendations for Outpatient Follow-up:  1. Follow up with PCP in 1-2 weeks 2. Please obtain BMP/CBC in one week your next doctors visit.  3. Aspirin Plavix for 3 weeks followed by aspirin alone. 4. Lipitor 40 mg daily 5. Follow-up outpatient neurology in 3-4 weeks 6. Counseled to quit drinking.  Vitamins and oral supplements prescribed 7. Added hydralazine 25 mg every 8 hours to her blood pressure regimen.   Discharge Condition: Stable CODE STATUS: Full Diet recommendation: Heart healthy  Brief/Interim Summary: Brief Narrative:  64 year old with history of HTN, polysubstance abuse, previous CVA came to the with acute onset of left-sided facial droop, dysarthria.  Initially she thought the symptoms would go away on its own therefore did not seek medical attention but eventually ended up coming to the ER on 4/1.  CT of the head showed age-indeterminate infarct.  MRI brain showed multifocal infarct.  Neurology team was consulted.  Recommended aspirin and Plavix for 3 weeks.  PT recommended CIR, ongoing placement.  Eventually patient was placed with recommendations as stated above going to CIR.   Acute multifocal white matter CVA -CT of the head showed indeterminate infarct, MRI brain confirmed multifocal infarction.  No large vessel occlusion seen on CTA of the head neck -Echocardiogram-EF 60-65%. -LDL 125, A1c 5.5 -Recommending aspirin 81 mg, Plavix  75 mg daily for 3 weeks followed by aspirin alone. -PT-CIR.    Hypokalemia -Resolved  Nonspecific nausea -Resolved.  Abdominal x-rays negative.  Lipase 28.  Essential hypertension uncontrolled -Resume home medications.  Hydralazine 25 mg 3 times daily added.  Hyperlipidemia -LDL 125.  Lipitor 40 mg daily  Peripheral arterial disease -Daily aspirin and statin  Chronic diastolic congestive heart failure -Patient appears to be euvolemic.  Echocardiogram showed EF of 60-65%  Severe protein calorie malnutrition -Seen by dietitian.  Supplements have been ordered.  Polysubstance abuse-alcohol, marijuana Tobacco use -Counseled to quit using this   Discharge Diagnoses:  Principal Problem:   CVA (cerebral vascular accident) ([place]) Active Problems:   Essential hypertension   Hyperlipidemia   PAD (peripheral artery disease) ([facility])   Underweight   Depression   Tobacco abuse   Marijuana use   Alcohol abuse   Chronic diastolic CHF (congestive heart failure) ([place])    Consultations:  Neurology  Subjective: Doing well.  Some dysarthria but no other complaints  Discharge Exam: Vitals:   05/12/19 0350 05/12/19 0817  BP: (!) 171/77 (!) 171/90  Pulse: 65 79  Resp: 17 18  Temp: 97.8 F (36.6 C) 98.1 F (36.7 C)  SpO2: 97% 95%   Vitals:   05/11/19 2043 05/11/19 2359 05/12/19 0350 05/12/19 0817  BP: (!) 181/89 (!) 165/87 (!) 171/77 (!) 171/90  Pulse: 70 76 65 79  Resp: 17 17 17 18   Temp: 98.2 F (36.8 C) 97.8 F (36.6 C) 97.8 F (36.6 C) 98.1 F (36.7 C)  TempSrc: Oral Oral Oral Oral  SpO2: 98% 99% 97% 95%  Height:        General: Pt is  alert, awake, not in acute distress, bilateral temporal wasting. Cardiovascular: RRR, S1/S2 +, no rubs, no gallops Respiratory: CTA bilaterally, no wheezing, no rhonchi Abdominal: Soft, NT, ND, bowel sounds + Extremities: no edema, no cyanosis Very mild dysarthric speech  Discharge Instructions  Discharge  Instructions    Activity as tolerated - No restrictions   Complete by: As directed    Diet - low sodium heart healthy   Complete by: As directed    Discharge instructions   Complete by: As directed    Continue current medication current antibiotics for ESBL UTI   Increase activity slowly   Complete by: As directed      Allergies as of 05/12/2019      Reactions   Trazodone And Nefazodone Nausea And Vomiting   Wellbutrin [bupropion] Nausea And Vomiting      Medication List    TAKE these medications   amLODipine 10 MG tablet Commonly known as: NORVASC Take 1 tablet (10 mg total) by mouth daily.   aspirin 81 MG EC tablet Take 1 tablet (81 mg total) by mouth daily.   atorvastatin 40 MG tablet Commonly known as: Lipitor Take 1 tablet (40 mg total) by mouth daily.   ciprofloxacin 500 MG tablet Commonly known as: Cipro Take 1 tablet (500 mg total) by mouth 2 (two) times daily for 10 days.   clopidogrel 75 MG tablet Commonly known as: PLAVIX Take 1 tablet (75 mg total) by mouth daily for 21 days.   diphenoxylate-atropine 2.5-0.025 MG tablet Commonly known as: Lomotil Take 1 tablet by mouth 4 (four) times daily as needed for diarrhea or loose stools.   doxylamine (Sleep) 25 MG tablet Commonly known as: UNISOM Take 25 mg by mouth at bedtime as needed for sleep.   escitalopram 20 MG tablet Commonly known as: LEXAPRO TAKE 1 TABLET(20 MG) BY MOUTH DAILY What changed: See the new instructions.   folic acid 1 MG tablet Commonly known as: FOLVITE Take 1 tablet (1 mg total) by mouth daily.   hydrALAZINE 50 MG tablet Commonly known as: APRESOLINE Take 1 tablet (50 mg total) by mouth every 8 (eight) hours.   multivitamin with minerals Tabs tablet Take 1 tablet by mouth daily.   thiamine 100 MG tablet Take 1 tablet (100 mg total) by mouth daily.      Follow-up Information    Nche, Charlene Brooke, NP. Schedule an appointment as soon as possible for a visit in 2 week(s).    Specialty: Internal Medicine Contact information: [address] [place] [address] [phone]          Allergies  Allergen Reactions  . Trazodone And Nefazodone Nausea And Vomiting  . Wellbutrin [Bupropion] Nausea And Vomiting    You were cared for by a hospitalist during your hospital stay. If you have any questions about your discharge medications or the care you received while you were in the hospital after you are discharged, you can call the unit and asked to speak with the hospitalist on call if the hospitalist that took care of you is not available. Once you are discharged, your primary care physician will handle any further medical issues. Please note that no refills for any discharge medications will be authorized once you are discharged, as it is imperative that you return to your primary care physician (or establish a relationship with a primary care physician if you do not have one) for your aftercare needs so that they can reassess your need for medications and  monitor your lab values.   Procedures/Studies: CT ANGIO HEAD W OR WO CONTRAST  Result Date: 05/05/2019 CLINICAL DATA:  Stroke follow-up EXAM: CT ANGIOGRAPHY HEAD AND NECK TECHNIQUE: Multidetector CT imaging of the head and neck was performed using the standard protocol during bolus administration of intravenous contrast. Multiplanar CT image reconstructions and MIPs were obtained to evaluate the vascular anatomy. Carotid stenosis measurements (when applicable) are obtained utilizing NASCET criteria, using the distal internal carotid diameter as the denominator. CONTRAST:  151mL OMNIPAQUE IOHEXOL 350 MG/ML SOLN COMPARISON:  Brain MRI from yesterday FINDINGS: CTA NECK FINDINGS Aortic arch: Atherosclerotic plaque. No acute finding or dilatation. Three vessel branching. Right carotid system: Mixed density plaque at the common carotid bifurcation with up to 40% stenosis. No ulceration. There is ICA tortuosity with mild  kinking. Left carotid system: Mixed density plaque at the bifurcation with bulb stenosis measuring up to 30%. No beading or ulceration. Vertebral arteries: Proximal subclavian atheromatous plaque on the left. The left vertebral artery is strongly dominant. Both vertebral arteries are widely patent to the dura. Skeleton: Prominent cervical facet spurring which is generalized and prominent. No acute or aggressive finding. Other neck: Negative Upper chest: Biapical pleural based scarring. Review of the MIP images confirms the above findings CTA HEAD FINDINGS Anterior circulation: Scattered atherosclerotic plaque on the carotid siphons without focal or flow reducing stenosis. No major branch occlusion. No aneurysm or vascular malformation. Posterior circulation: Strong left vertebral artery dominance. Most of right vertebral flow is to the PICA. Atheromatous irregularity of the bilateral posterior cerebral arteries which is mild to moderate. No branch occlusion or aneurysm. Venous sinuses: Unremarkable in the arterial phase. Anatomic variants: None significant Review of the MIP images confirms the above findings IMPRESSION: 1. No emergent finding. 2. Cervical and intracranial atherosclerosis without flow reducing stenosis or ulceration of major vessels. Electronically Signed   By: Monte Fantasia M.D.   On: 05/05/2019 04:34   DG Abd 1 View  Result Date: 05/07/2019 CLINICAL DATA:  Abdominal distension EXAM: ABDOMEN - 1 VIEW COMPARISON:  CT abdomen dated 08/18/2018. FINDINGS: No dilated large or small bowel loops are appreciated. No evidence of renal or ureteral calculi. No evidence of free air under either diaphragm. No acute or suspicious osseous finding. IMPRESSION: Nonobstructive bowel gas pattern. Electronically Signed   By: Franki Cabot M.D.   On: 05/07/2019 12:48   CT Head Wo Contrast  Result Date: 05/04/2019 CLINICAL DATA:  Neuro deficit, acute, stroke suspected. Additional history provided: Patient  transported via EMS from home for slurred speech and left-sided facial droop, symptoms developed Tuesday night (unknown time). History of stroke. EXAM: CT HEAD WITHOUT CONTRAST TECHNIQUE: Contiguous axial images were obtained from the base of the skull through the vertex without intravenous contrast. COMPARISON:  No pertinent prior studies available for comparison. FINDINGS: Brain: Mildly motion degraded examination. Age-indeterminate infarct within the right corona radiata/basal ganglia. There is an additional age-indeterminate infarct more anteriorly within the right basal ganglia also involve the adjacent deep right frontal white matter. There is no evidence of acute intracranial hemorrhage. No midline shift or extra-axial fluid collection. Background mild ill-defined hypoattenuation within the cerebral white matter is nonspecific, but consistent with chronic small vessel ischemic disease. Mild generalized parenchymal atrophy. Vascular: No hyperdense vessel is identified. Atherosclerotic calcifications. Skull: Normal. Negative for fracture or focal lesion. Sinuses/Orbits: Visualized orbits demonstrate no acute abnormality. No significant paranasal sinus disease or mastoid effusion at the imaged levels. IMPRESSION: Mildly motion degraded examination. Age-indeterminate infarcts within the  right frontal lobe white matter and right basal ganglia as described, possibly acute or subacute. Brain MRI is suggested for further evaluation. Background mild generalized parenchymal atrophy and chronic small vessel ischemic disease. Electronically Signed   By: Kellie Simmering DO   On: 05/04/2019 17:24   CT ANGIO NECK W OR WO CONTRAST  Result Date: 05/05/2019 CLINICAL DATA:  Stroke follow-up EXAM: CT ANGIOGRAPHY HEAD AND NECK TECHNIQUE: Multidetector CT imaging of the head and neck was performed using the standard protocol during bolus administration of intravenous contrast. Multiplanar CT image reconstructions and MIPs were  obtained to evaluate the vascular anatomy. Carotid stenosis measurements (when applicable) are obtained utilizing NASCET criteria, using the distal internal carotid diameter as the denominator. CONTRAST:  193mL OMNIPAQUE IOHEXOL 350 MG/ML SOLN COMPARISON:  Brain MRI from yesterday FINDINGS: CTA NECK FINDINGS Aortic arch: Atherosclerotic plaque. No acute finding or dilatation. Three vessel branching. Right carotid system: Mixed density plaque at the common carotid bifurcation with up to 40% stenosis. No ulceration. There is ICA tortuosity with mild kinking. Left carotid system: Mixed density plaque at the bifurcation with bulb stenosis measuring up to 30%. No beading or ulceration. Vertebral arteries: Proximal subclavian atheromatous plaque on the left. The left vertebral artery is strongly dominant. Both vertebral arteries are widely patent to the dura. Skeleton: Prominent cervical facet spurring which is generalized and prominent. No acute or aggressive finding. Other neck: Negative Upper chest: Biapical pleural based scarring. Review of the MIP images confirms the above findings CTA HEAD FINDINGS Anterior circulation: Scattered atherosclerotic plaque on the carotid siphons without focal or flow reducing stenosis. No major branch occlusion. No aneurysm or vascular malformation. Posterior circulation: Strong left vertebral artery dominance. Most of right vertebral flow is to the PICA. Atheromatous irregularity of the bilateral posterior cerebral arteries which is mild to moderate. No branch occlusion or aneurysm. Venous sinuses: Unremarkable in the arterial phase. Anatomic variants: None significant Review of the MIP images confirms the above findings IMPRESSION: 1. No emergent finding. 2. Cervical and intracranial atherosclerosis without flow reducing stenosis or ulceration of major vessels. Electronically Signed   By: Monte Fantasia M.D.   On: 05/05/2019 04:34   MR BRAIN WO CONTRAST  Result Date:  05/04/2019 CLINICAL DATA:  Left facial droop and dysarthria noted over the last 3 days. EXAM: MRI HEAD WITHOUT CONTRAST TECHNIQUE: Multiplanar, multiecho pulse sequences of the brain and surrounding structures were obtained without intravenous contrast. COMPARISON:  Head CT same day FINDINGS: Brain: Motion degraded study. No acute finding affects the brainstem or cerebellum. 8 mm acute infarction in the posterior limb internal capsule on the right. Punctate acute infarction in the left caudate head or white matter adjacent to the frontal horn of the lateral ventricle. 8 mm acute to subacute infarction in the white matter adjacent to the atrium of the right lateral ventricle. Other white matter disease seen at CT is not acute or subacute. Old right basal ganglia infarction. No hydrocephalus. No sign of gross hemorrhage. No extra-axial collection. IMPRESSION: Diffusion only study is degraded by motion. 8 mm acute infarction in the posterior limb internal capsule on the right. Punctate acute infarction either in the left caudate head or white matter adjacent to the frontal horn of the left lateral ventricle. 8 mm acute to subacute infarction in the white matter adjacent to the atrium of the right lateral ventricle. Old small vessel infarctions elsewhere affecting the hemispheric white matter and right basal ganglia. Electronically Signed   By: Nelson Chimes  M.D.   On: 05/04/2019 20:58   ECHOCARDIOGRAM COMPLETE  Result Date: 05/05/2019    ECHOCARDIOGRAM REPORT   Patient Name:   KATHERINA BALLOG Date of Exam: 05/05/2019 Medical Rec #:  [id number]       Height:       64.6 in Accession #:    [id number]      Weight:       89.1 lb Date of Birth:  02/19/1955        BSA:          1.396 m Patient Age:    64 years        BP:           156/74 mmHg Patient Gender: F               HR:           59 bpm. Exam Location:  Inpatient Procedure: 2D Echo Indications:    TIA 435.9 / G45.9  History:        Patient has no prior history of  Echocardiogram examinations.                 Stroke; Risk Factors:Dyslipidemia and Hypertension.  Sonographer:    Vikki Ports Turrentine Referring Phys: [id number] JAN A [place]  1. Normal LV systolic function; grade 1 diastolic dysfunction.  2. Left ventricular ejection fraction, by estimation, is 60 to 65%. The left ventricle has normal function. The left ventricle has no regional wall motion abnormalities. Left ventricular diastolic parameters are consistent with Grade I diastolic dysfunction (impaired relaxation).  3. Right ventricular systolic function is normal. The right ventricular size is normal.  4. The mitral valve is normal in structure. Trivial mitral valve regurgitation. No evidence of mitral stenosis.  5. The aortic valve is tricuspid. Aortic valve regurgitation is not visualized. No aortic stenosis is present.  6. The inferior vena cava is normal in size with greater than 50% respiratory variability, suggesting right atrial pressure of 3 mmHg. FINDINGS  Left Ventricle: Left ventricular ejection fraction, by estimation, is 60 to 65%. The left ventricle has normal function. The left ventricle has no regional wall motion abnormalities. The left ventricular internal cavity size was normal in size. There is  no left ventricular hypertrophy. Left ventricular diastolic parameters are consistent with Grade I diastolic dysfunction (impaired relaxation). Right Ventricle: The right ventricular size is normal. Right ventricular systolic function is normal. Left Atrium: Left atrial size was normal in size. Right Atrium: Right atrial size was normal in size. Pericardium: There is no evidence of pericardial effusion. Mitral Valve: The mitral valve is normal in structure. Normal mobility of the mitral valve leaflets. Trivial mitral valve regurgitation. No evidence of mitral valve stenosis. Tricuspid Valve: The tricuspid valve is normal in structure. Tricuspid valve regurgitation is trivial. No evidence of  tricuspid stenosis. Aortic Valve: The aortic valve is tricuspid. Aortic valve regurgitation is not visualized. No aortic stenosis is present. Aortic valve mean gradient measures 4.0 mmHg. Aortic valve peak gradient measures 8.0 mmHg. Aortic valve area, by VTI measures 2.11 cm. Pulmonic Valve: The pulmonic valve was normal in structure. Pulmonic valve regurgitation is not visualized. No evidence of pulmonic stenosis. Aorta: The aortic root is normal in size and structure. Venous: The inferior vena cava is normal in size with greater than 50% respiratory variability, suggesting right atrial pressure of 3 mmHg.  Additional Comments: Normal LV systolic function; grade 1 diastolic dysfunction.  LEFT VENTRICLE PLAX 2D LVIDd:  3.90 cm  Diastology LVIDs:         2.60 cm  LV e' lateral:   10.80 cm/s LV PW:         0.90 cm  LV E/e' lateral: 8.6 LV IVS:        0.90 cm  LV e' medial:    7.07 cm/s LVOT diam:     1.90 cm  LV E/e' medial:  13.2 LV SV:         72 LV SV Index:   52 LVOT Area:     2.84 cm  RIGHT VENTRICLE RV S prime:     12.70 cm/s TAPSE (M-mode): 2.2 cm LEFT ATRIUM             Index LA diam:        2.80 cm 2.01 cm/m LA Vol (A2C):   35.4 ml 25.35 ml/m LA Vol (A4C):   38.9 ml 27.86 ml/m LA Biplane Vol: 39.7 ml 28.43 ml/m  AORTIC VALVE AV Area (Vmax):    2.27 cm AV Area (Vmean):   2.26 cm AV Area (VTI):     2.11 cm AV Vmax:           141.00 cm/s AV Vmean:          97.700 cm/s AV VTI:            0.342 m AV Peak Grad:      8.0 mmHg AV Mean Grad:      4.0 mmHg LVOT Vmax:         113.00 cm/s LVOT Vmean:        78.000 cm/s LVOT VTI:          0.255 m LVOT/AV VTI ratio: 0.75  AORTA Ao Root diam: 2.90 cm MITRAL VALVE MV Area (PHT): 2.66 cm    SHUNTS MV Decel Time: 285 msec    Systemic VTI:  0.26 m MV E velocity: 93.10 cm/s  Systemic Diam: 1.90 cm MV A velocity: 99.90 cm/s MV E/A ratio:  0.93 Kirk Ruths MD Electronically signed by Kirk Ruths MD Signature Date/Time: 05/05/2019/2:17:46 PM    Final       The results of significant diagnostics from this hospitalization (including imaging, microbiology, ancillary and laboratory) are listed below for reference.     Microbiology: Recent Results (from the past 240 hour(s))  Urine culture     Status: Abnormal   Collection Time: 05/04/19  5:22 PM   Specimen: Urine, Random  Result Value Ref Range Status   Specimen Description URINE, RANDOM  Final   Special Requests   Final    NONE Performed at [facility], [address], [place], [place] [address]    Culture (A)  Final    70,000 COLONIES/mL ESCHERICHIA COLI Confirmed Extended Spectrum Beta-Lactamase Producer (ESBL).  In bloodstream infections from ESBL organisms, carbapenems are preferred over piperacillin/tazobactam. They are shown to have a lower risk of mortality.    Report Status 05/06/2019 FINAL  Final   Organism ID, Bacteria ESCHERICHIA COLI (A)  Final      Susceptibility   Escherichia coli - MIC*    AMPICILLIN >=32 RESISTANT Resistant     CEFAZOLIN >=64 RESISTANT Resistant     CEFTRIAXONE >=64 RESISTANT Resistant     CIPROFLOXACIN <=0.25 SENSITIVE Sensitive     GENTAMICIN <=1 SENSITIVE Sensitive     IMIPENEM <=0.25 SENSITIVE Sensitive     NITROFURANTOIN <=16 SENSITIVE Sensitive     TRIMETH/SULFA <=20 SENSITIVE Sensitive  AMPICILLIN/SULBACTAM >=32 RESISTANT Resistant     PIP/TAZO <=4 SENSITIVE Sensitive     * 70,000 COLONIES/mL ESCHERICHIA COLI  SARS CORONAVIRUS 2 (TAT 6-24 HRS) Nasopharyngeal Nasopharyngeal Swab     Status: None   Collection Time: 05/04/19 10:46 PM   Specimen: Nasopharyngeal Swab  Result Value Ref Range Status   SARS Coronavirus 2 NEGATIVE NEGATIVE Final    Comment: (NOTE) SARS-CoV-2 target nucleic acids are NOT DETECTED. The SARS-CoV-2 RNA is generally detectable in upper and lower respiratory specimens during the acute phase of infection. Negative results do not preclude SARS-CoV-2 infection, do not rule out co-infections with other  pathogens, and should not be used as the sole basis for treatment or other patient management decisions. Negative results must be combined with clinical observations, patient history, and epidemiological information. The expected result is Negative. Fact Sheet for Patients: SugarRoll.be Fact Sheet for Healthcare Providers: https://www.woods-mathews.com/ This test is not yet approved or cleared by the [place] FDA and  has been authorized for detection and/or diagnosis of SARS-CoV-2 by FDA under an Emergency Use Authorization (EUA). This EUA will remain  in effect (meaning this test can be used) for the duration of the COVID-19 declaration under Section 56 4(b)(1) of the Act, 21 U.S.C. section 360bbb-3(b)(1), unless the authorization is terminated or revoked sooner. Performed at [facility], [place] [address] [place], [place] [address]      Labs: BNP (last 3 results) Recent Labs    05/06/19 1027  BNP [id number]*   Basic Metabolic Panel: Recent Labs  Lab 05/08/19 0336 05/08/19 0336 05/08/19 0847 05/09/19 0227 05/10/19 0357 05/11/19 0426 05/12/19 0342  NA 140   < > 139 141 140 139 141  K 4.2   < > 4.7 3.8 3.8 3.4* 3.4*  CL 104   < > 103 104 102 102 102  CO2 22   < > 22 25 27 26 27   GLUCOSE 104*   < > 106* 99 101* 108* 103*  BUN 12   < > 13 13 13 14 15   CREATININE 0.77   < > 0.87 0.67 0.75 0.73 0.76  CALCIUM 9.4   < > 9.5 9.3 9.2 9.0 9.2  MG 1.8  --   --  1.7 1.8 1.8 1.9  PHOS 2.8  --   --  3.5 4.1 4.6 4.3   < > = values in this interval not displayed.   Liver Function Tests: Recent Labs  Lab 05/08/19 0847  AST 27  ALT 21  ALKPHOS 74  BILITOT 0.9  PROT 7.3  ALBUMIN 4.0   Recent Labs  Lab 05/07/19 1531  LIPASE 28   No results for input(s): AMMONIA in the last 168 hours. CBC: Recent Labs  Lab 05/08/19 0847  WBC 7.6  HGB 14.1  HCT 44.4  MCV 89.7  PLT 307   Cardiac Enzymes: No results for input(s):  CKTOTAL, CKMB, CKMBINDEX, TROPONINI in the last 168 hours. BNP: Invalid input(s): POCBNP CBG: No results for input(s): GLUCAP in the last 168 hours. D-Dimer No results for input(s): DDIMER in the last 72 hours. Hgb A1c No results for input(s): HGBA1C in the last 72 hours. Lipid Profile No results for input(s): CHOL, HDL, LDLCALC, TRIG, CHOLHDL, LDLDIRECT in the last 72 hours. Thyroid function studies No results for input(s): TSH, T4TOTAL, T3FREE, THYROIDAB in the last 72 hours.  Invalid input(s): FREET3 Anemia work up No results for input(s): VITAMINB12, FOLATE, FERRITIN, TIBC, IRON, RETICCTPCT in the last 72 hours. Urinalysis  Component Value Date/Time   COLORURINE YELLOW 05/04/2019 1730   APPEARANCEUR HAZY (A) 05/04/2019 1730   LABSPEC 1.024 05/04/2019 1730   PHURINE 5.0 05/04/2019 1730   GLUCOSEU NEGATIVE 05/04/2019 1730   HGBUR NEGATIVE 05/04/2019 1730   BILIRUBINUR NEGATIVE 05/04/2019 1730   KETONESUR 80 (A) 05/04/2019 1730   PROTEINUR 100 (A) 05/04/2019 1730   NITRITE NEGATIVE 05/04/2019 1730   LEUKOCYTESUR NEGATIVE 05/04/2019 1730   Sepsis Labs Invalid input(s): PROCALCITONIN,  WBC,  LACTICIDVEN Microbiology Recent Results (from the past 240 hour(s))  Urine culture     Status: Abnormal   Collection Time: 05/04/19  5:22 PM   Specimen: Urine, Random  Result Value Ref Range Status   Specimen Description URINE, RANDOM  Final   Special Requests   Final    NONE Performed at [facility], [place] [address], [place], [place] [address]    Culture (A)  Final    70,000 COLONIES/mL ESCHERICHIA COLI Confirmed Extended Spectrum Beta-Lactamase Producer (ESBL).  In bloodstream infections from ESBL organisms, carbapenems are preferred over piperacillin/tazobactam. They are shown to have a lower risk of mortality.    Report Status 05/06/2019 FINAL  Final   Organism ID, Bacteria ESCHERICHIA COLI (A)  Final      Susceptibility   Escherichia coli - MIC*    AMPICILLIN >=32  RESISTANT Resistant     CEFAZOLIN >=64 RESISTANT Resistant     CEFTRIAXONE >=64 RESISTANT Resistant     CIPROFLOXACIN <=0.25 SENSITIVE Sensitive     GENTAMICIN <=1 SENSITIVE Sensitive     IMIPENEM <=0.25 SENSITIVE Sensitive     NITROFURANTOIN <=16 SENSITIVE Sensitive     TRIMETH/SULFA <=20 SENSITIVE Sensitive     AMPICILLIN/SULBACTAM >=32 RESISTANT Resistant     PIP/TAZO <=4 SENSITIVE Sensitive     * 70,000 COLONIES/mL ESCHERICHIA COLI  SARS CORONAVIRUS 2 (TAT 6-24 HRS) Nasopharyngeal Nasopharyngeal Swab     Status: None   Collection Time: 05/04/19 10:46 PM   Specimen: Nasopharyngeal Swab  Result Value Ref Range Status   SARS Coronavirus 2 NEGATIVE NEGATIVE Final    Comment: (NOTE) SARS-CoV-2 target nucleic acids are NOT DETECTED. The SARS-CoV-2 RNA is generally detectable in upper and lower respiratory specimens during the acute phase of infection. Negative results do not preclude SARS-CoV-2 infection, do not rule out co-infections with other pathogens, and should not be used as the sole basis for treatment or other patient management decisions. Negative results must be combined with clinical observations, patient history, and epidemiological information. The expected result is Negative. Fact Sheet for Patients: SugarRoll.be Fact Sheet for Healthcare Providers: https://www.woods-mathews.com/ This test is not yet approved or cleared by the [place] FDA and  has been authorized for detection and/or diagnosis of SARS-CoV-2 by FDA under an Emergency Use Authorization (EUA). This EUA will remain  in effect (meaning this test can be used) for the duration of the COVID-19 declaration under Section 56 4(b)(1) of the Act, 21 U.S.C. section 360bbb-3(b)(1), unless the authorization is terminated or revoked sooner. Performed at [facility], [place] [address], [place], [place] [address]      Time coordinating discharge:  I have spent 35  minutes face to face with the patient and on the ward discussing the patients care, assessment, plan and disposition with other care givers. >50% of the time was devoted counseling the patient about the risks and benefits of treatment/Discharge disposition and coordinating care.   SIGNED:   Deatra James, MD  [facility] 05/12/2019, 10:41  AM   If 7PM-7AM, please contact night-coverage

## 2019-05-12 NOTE — Progress Notes (Signed)
Raechel Ache, OT  Rehab Admission Coordinator  Physical Medicine and Rehabilitation  PMR Pre-admission      Addendum  Date of Service:  05/08/2019  3:49 PM      Related encounter: ED to Hosp-Admission (Discharged) from 05/04/2019 in [facility]        PMR Admission Coordinator Pre-Admission Assessment   Patient: Stacie [name] is an 64 y.o., female MRN: [id number] DOB: [birth year] Height: 5' 4"  (162.6 cm) Weight:                                                                                                                                                    Insurance Information HMO: yes    PPO:      PCP:     IPA:      80/20:     OTHER:  PRIMARY:[employer] MCR      Policy#: [id number]     Subscriber: patient CM Name: Wells Guiles through phone [phone] (confirmed again through Rhetta Mura with pre-service) (also confirmed with [facility] [employer] [place] S. At phone [phone] that current Josem Kaufmann is in-network)      Phone#: [phone]     Fax#: [phone] Pre-Cert#: original case #: [id number]; NEW case # for this case is: [id number] (confirmed with Wells Guiles and Rhetta Mura new case number is for this patient and for inpatient rehab)     Employer:  Benefits:  Phone #: online     Name: [email or web address] Eff. Date: 02/03/19-02/02/20     Deduct: $0     Out of Pocket Max: $3,600 ($0 met)      Life Max:  CIR: $295/day for days 1-5, $0/day for days 6+      SNF:$0 /day for days 1-20, $184/day for days 21-40, $0/day for days 41-100; limited to 100 days/calendar year Outpatient: $30/visit co-pay; limited by medical necessity     Co-Pay:  Home Health: 100% coverage, $0 co-insurance; limited by medical necessity      Co-Pay:  DME: 80% coverage, 20% co-insurance     Co-Pay:  Providers: SECONDARY:       Policy#:       Subscriber:  CM Name:       Phone#:      Fax#:  Pre-Cert#:       Employer:  Benefits:  Phone #:      Name:  Eff. Date:      Deduct:       Out of Pocket Max:       Life Max:    CIR:       SNF:  Outpatient:      Co-Pay:  Home Health:       Co-Pay:  DME:      Co-Pay:    Medicaid Application Date:       Case Manager:  Disability Application Date:  Case Worker:    The "Data Collection Information Summary" for patients in Inpatient Rehabilitation Facilities with attached "[place] Records" was provided and verbally reviewed with: Patient   Emergency Contact Information         Contact Information     Name Relation Home Work [place] Daughter [phone]   [phone]    Austin,Courtney Daughter [phone]   [phone]       Current Medical History  Patient Admitting Diagnosis: R punctate left caudate infarct   History of Present Illness: Stacie [name] is a 64 year old female with history of hypertension, hyperlipidemia, tobacco abuse as well as marijuana. Presented on 05/04/2019 with bilateral lower extremity weakness and difficulty with ambulation with facial droop and dysarthria.  Cranial CT scan age-indeterminate infarcts within the right frontal lobe white matter and right basal ganglia possibly acute or subacute.  MRI showed a 8 mm acute infarction posterior limb internal capsule on the right.  Punctate acute infarct either in the left caudate head or white matter adjacent to the frontal horn of the left lateral ventricle.  8 mm acute to subacute infarct in the white matter adjacent to the atrium of the right lateral ventricle.  Patient did not receive TPA.  CT angiogram head and neck no emergent findings.  Echocardiogram with ejection fraction 65% without emboli.  Admission chemistries unremarkable except potassium 3.3, alcohol negative, urine drug screen positive marijuana, urinalysis greater than 70,000 ESBL.  Maintained on aspirin and Plavix for CVA prophylaxis x3 weeks then aspirin alone.  Subcutaneous Lovenox for DVT prophylaxis.  Therapy evaluations completed and patient is to be admitted for a comprehensive  rehab program on 05/12/19.    Complete NIHSS TOTAL: 5 Glasgow Coma Scale Score: 15   Past Medical History      Past Medical History:  Diagnosis Date  . Colitis    . Hyperlipidemia    . Hypertension    . Stroke ([place]) 04/2012    unknown type  . Thyroid disease        Family History  family history includes Heart disease in her maternal grandmother, paternal grandfather, and paternal uncle; Heart disease (age of onset: 16) in her father; Heart disease (age of onset: 71) in her sister; Stroke (age of onset: 55) in her mother.   Prior Rehab/Hospitalizations:  Has the patient had prior rehab or hospitalizations prior to admission? No   Has the patient had major surgery during 100 days prior to admission? No   Current Medications    Current Facility-Administered Medications:  .  acetaminophen (TYLENOL) tablet 650 mg, 650 mg, Oral, Q4H PRN **OR** acetaminophen (TYLENOL) 160 MG/5ML solution 650 mg, 650 mg, Per Tube, Q4H PRN **OR** acetaminophen (TYLENOL) suppository 650 mg, 650 mg, Rectal, Q4H PRN, Mansy, Jan A, MD .  amLODipine (NORVASC) tablet 10 mg, 10 mg, Oral, Daily, Mansy, Jan A, MD, 10 mg at 05/08/19 0925 .  aspirin EC tablet 81 mg, 81 mg, Oral, Daily, Biby, Sharon L, NP, 81 mg at 05/08/19 0926 .  clopidogrel (PLAVIX) tablet 75 mg, 75 mg, Oral, Daily, Biby, Sharon L, NP, 75 mg at 05/08/19 0926 .  doxylamine (Sleep) (UNISOM) tablet 25 mg, 25 mg, Oral, QHS PRN, Mansy, Jan A, MD, 25 mg at 05/07/19 2149 .  enoxaparin (LOVENOX) injection 40 mg, 40 mg, Subcutaneous, Q24H, Mansy, Jan A, MD, 40 mg at 05/08/19 0931 .  escitalopram (LEXAPRO) tablet 20 mg, 20 mg, Oral, Daily, Mansy, Jan A, MD, 20 mg at  05/08/19 0926 .  feeding supplement (ENSURE ENLIVE) (ENSURE ENLIVE) liquid 237 mL, 237 mL, Oral, BID BM, Annita Brod, MD, 237 mL at 05/08/19 1312 .  folic acid (FOLVITE) tablet 1 mg, 1 mg, Oral, Daily, Amin, Ankit Chirag, MD, 1 mg at 05/08/19 0925 .  labetalol (NORMODYNE) injection 20 mg,  20 mg, Intravenous, Q3H PRN, Biby, Sharon L, NP .  LORazepam (ATIVAN) tablet 1-4 mg, 1-4 mg, Oral, Q1H PRN **OR** LORazepam (ATIVAN) injection 1-4 mg, 1-4 mg, Intravenous, Q1H PRN, Amin, Ankit Chirag, MD .  multivitamin with minerals tablet 1 tablet, 1 tablet, Oral, Daily, Annita Brod, MD, 1 tablet at 05/08/19 0925 .  nicotine (NICODERM CQ - dosed in mg/24 hours) patch 14 mg, 14 mg, Transdermal, Daily, Biby, Sharon L, NP, 14 mg at 05/08/19 0930 .  nystatin (MYCOSTATIN) 100000 UNIT/ML suspension 500,000 Units, 5 mL, Oral, TID, Amin, Ankit Chirag, MD, 500,000 Units at 05/07/19 2149 .  ondansetron (ZOFRAN) injection 4 mg, 4 mg, Intravenous, Q6H PRN, Amin, Ankit Chirag, MD, 4 mg at 05/08/19 0936 .  senna-docusate (Senokot-S) tablet 1 tablet, 1 tablet, Oral, QHS PRN, Mansy, Jan A, MD .  simvastatin (ZOCOR) tablet 20 mg, 20 mg, Oral, q1800, Mansy, Jan A, MD, 20 mg at 05/07/19 1700 .  thiamine tablet 100 mg, 100 mg, Oral, Daily, 100 mg at 05/08/19 0925 **OR** thiamine (B-1) injection 100 mg, 100 mg, Intravenous, Daily, Amin, Ankit Chirag, MD   Patients Current Diet:     Diet Order                      DIET DYS 3 Room service appropriate? Yes; Fluid consistency: Thin  Diet effective now                   Precautions / Restrictions Precautions Precautions: Fall Restrictions Weight Bearing Restrictions: No    Has the patient had 2 or more falls or a fall with injury in the past year?Yes   Prior Activity Level Limited Community (1-2x/wk): 2x/weel per pt report   Prior Functional Level Prior Function Level of Independence: Independent   Self Care: Did the patient need help bathing, dressing, using the toilet or eating?  Independent   Indoor Mobility: Did the patient need assistance with walking from room to room (with or without device)? Independent   Stairs: Did the patient need assistance with internal or external stairs (with or without device)? Independent   Functional  Cognition: Did the patient need help planning regular tasks such as shopping or remembering to take medications? Independent   Home Assistive Devices / Equipment Home Equipment: None   Prior Device Use: Indicate devices/aids used by the patient prior to current illness, exacerbation or injury? None of the above   Current Functional Level Cognition   Arousal/Alertness: Awake/alert Overall Cognitive Status: Impaired/Different from baseline Orientation Level: Oriented X4 Following Commands: Follows multi-step commands inconsistently, Follows one step commands with increased time Safety/Judgement: Decreased awareness of deficits, Decreased awareness of safety General Comments: patient with impulsivity and axiety with mobility due to unsteadiness and nausea.   Attention: Focused, Sustained Focused Attention: Impaired Focused Attention Impairment: Verbal complex Sustained Attention: Impaired Sustained Attention Impairment: Verbal complex Memory: Impaired Memory Impairment: Retrieval deficit, Storage deficit, Decreased recall of new information(Immediate: 5/5; delayed: 4/5) Awareness: Impaired Awareness Impairment: Emergent impairment Problem Solving: Impaired Problem Solving Impairment: Verbal complex Executive Function: Reasoning Reasoning: Impaired Reasoning Impairment: Verbal complex    Extremity Assessment (includes Sensation/Coordination)  Upper Extremity Assessment: LUE deficits/detail LUE Deficits / Details: holding UE internally rotated and decrease attention to L UE. pt with decrease grasp and fine motor. pt able to place hand around cup but unable to sustain,. AROM shoulder flexion 80 degrees unable to sustain LUE Sensation: decreased light touch LUE Coordination: decreased fine motor, decreased gross motor  Lower Extremity Assessment: LLE deficits/detail LLE Deficits / Details: able to perform limited SLR but not hold against resistance. deferred formal MMT as pt distracted  by nausea     ADLs   Overall ADL's : Needs assistance/impaired Eating/Feeding: Moderate assistance, Bed level Eating/Feeding Details (indicate cue type and reason): needed encouragement to visually attend to cup to scoop out ice chips. pt motivated to eat ice so engaged in task.  Grooming: Wash/dry face, Minimal assistance, Bed level Grooming Details (indicate cue type and reason): pt pulling cool wash cloth off face and putting it back in a restless manner Lower Body Dressing Details (indicate cue type and reason): pt declines to don due to urgency and states "no you do it" Toilet Transfer: +2 for physical assistance, Moderate assistance, Ambulation, BSC Toileting- Clothing Manipulation and Hygiene: Moderate assistance, Sitting/lateral lean Functional mobility during ADLs: +2 for physical assistance, Moderate assistance General ADL Comments: pt very restless and overall impulsive at this time. pt needs cues to remain seated and not to pull at leads/ therapists     Mobility   Overal bed mobility: Needs Assistance Bed Mobility: Supine to Sit Rolling: Min assist Supine to sit: Min guard Sit to supine: Min assist General bed mobility comments: min guard for safety     Transfers   Overall transfer level: Needs assistance Equipment used: 1 person hand held assist Transfers: Sit to/from Stand Sit to Stand: Min assist, Max assist General transfer comment: min A to stand X 2 trials and max A required due to L lateral LOB when standing from recliner; cues for safety      Ambulation / Gait / Stairs / Wheelchair Mobility   Ambulation/Gait Ambulation/Gait assistance: Mod assist, +2 safety/equipment Gait Distance (Feet): (75 ft seated break then 75 ft ) Assistive device: 1 person hand held assist(assist at trunk with gait belt) Gait Pattern/deviations: Step-through pattern, Decreased stride length, Decreased stance time - left, Drifts right/left, Narrow base of support General Gait Details: at  least single UE support required for balance and proprioception; cues for forward gaze as pt has R gaze preference(+2 for line management) Gait velocity: decreased Gait velocity interpretation: <1.31 ft/sec, indicative of household ambulator     Posture / Balance Balance Overall balance assessment: Needs assistance Sitting-balance support: Bilateral upper extremity supported, Feet supported Sitting balance-Leahy Scale: Fair Standing balance support: Bilateral upper extremity supported, Single extremity supported, During functional activity Standing balance-Leahy Scale: Poor     Special needs/care consideration BiPAP/CPAP no CPM no Continuous Drip IV no Dialysis no        Days no Life Vest no Oxygen no Special Bed no Trach Size no Wound Vac (area) no       Location no Skin: ecchymosis arm (left, right), skin tear arm (left, right); pressure injury 05/04/19 buttocks right bruise                          Bowel mgmt: Last BM 05/05/19 Bladder mgmt:no Diabetic mgmt no Behavioral consideration  no Chemo/radiation no        Previous Home Environment (from acute therapy documentation) Living Arrangements: Other (Comment)(lived  with ex-husband)  Lives With: Other (Comment)(ex-husband) Available Help at Discharge: Family, Available 24 hours/day Type of Home: House Home Layout: 2 levels. Does not go to basement. Able to live on main level with bedroom/bathroom Home Access: Stairs to enter Entrance Stairs-Rails: Can reach both Entrance Stairs-Number of Steps: 3 Bathroom Shower/Tub: Chiropodist: Standard Bathroom accessibility:  Side stepping with a walker Additional Comments: x2 daughters present reports ex spouse has 3 cats in the house but she does not care for animals    Discharge Living Setting Plans for Discharge Living Setting: House, Other (Comment)(ex-husband) Type of Home at Discharge: House Discharge Home Layout: 2 levels.  Does not go to basement. Able to  live on main level with bedroom/bathroom Discharge Home Access: Stairs to enter Entrance Stairs-Rails: Can reach both Entrance Stairs-Number of Steps: 3 Discharge Bathroom Shower/Tub: Tub/shower unit Discharge Bathroom Toilet: Standard Discharge Bathroom accessibility:  Side stepping with a walker Social/Family/Support Systems Patient Roles: Other (Comment)(lived with ex-husband, supportive daughter, on disability) Anticipated Caregiver: ex-husband with supportive daughter Anticipated Caregiver's Contact Information: [name], [phone] Caregiver Availability: 24/7     Goals/Additional Needs Patient/Family Goal for Rehab: supervision-Min A PT/OT Expected length of stay: 7-10 days Pt/Family Agrees to Admission and willing to participate: Yes Program Orientation Provided & Reviewed with Pt/Caregiver Including Roles  & Responsibilities: Yes     Decrease burden of Care through IP rehab admission: NA     Possible need for SNF placement upon discharge:not anticipated     Patient Condition: This patient's medical and functional status has changed since the consult dated: 05/08/19 in which the Rehabilitation Physician determined and documented that the patient's condition is appropriate for intensive rehabilitative care in an inpatient rehabilitation facility. See "History of Present Illness" (above) for medical update. Functional changes are: improvement in bed mobility from Min/Mod A to Independent, improvement in transfers from Min A +2 to Min/Mod A, and improvement in ambulation from Mod A +2 15 feet to Min/Mod A for 3 bouts of ~ 100 ft). Patient's medical and functional status update has been discussed with the Rehabilitation physician and patient remains appropriate for inpatient rehabilitation. Will admit to inpatient rehab today.   Preadmission Screen Completed By:  Bethel Born, CCC-SLP, 05/08/2019 3:49 PM; Day of admit updates completed by Raechel Ache, OTR/L on 05/12/19 at 11:27AM.  ______________________________________________________________________   Discussed status with Dr. Naaman Plummer on 05/12/19 at 9:33AM and received approval for admission today.   Admission Coordinator:  Bethel Born, time9:33AM/Date 05/12/19    Day of admit updates completed by Raechel Ache, OTR/L on 05/12/19 at 11:27AM.     Cosigned by: Meredith Staggers, MD at 05/12/2019 11:33 AM  Revision History

## 2019-05-12 NOTE — Progress Notes (Signed)
Nutrition Follow-up  DOCUMENTATION CODES:   Severe malnutrition in context of social or environmental circumstances  INTERVENTION:  Please obtain updated weight.   Increase Ensure Enlive po TID, each supplement provides 350 kcal and 20 grams of protein  Continue MVI daily  Magic cup TID with meals, each supplement provides 290 kcal and 9 grams of protein  NUTRITION DIAGNOSIS:   Severe Malnutrition related to social / environmental circumstances as evidenced by severe fat depletion, severe muscle depletion.  Ongoing.  GOAL:   Patient will meet greater than or equal to 90% of their needs  Progressing.   MONITOR:   PO intake, Supplement acceptance, Labs, Weight trends, Skin, I & O's  REASON FOR ASSESSMENT:   Consult Assessment of nutrition requirement/status  ASSESSMENT:   64 y.o. Caucasian female with a known history of hypertension, CHF, dyslipidemia and tobacco, marijuana and alcohol abuse as well as depression who is admitted with CVA  Pt pending placement in CIR.   Pt reports appetite is improving. PTA, pt ate mostly salads, fruit, vegetables, beans, eggs, and some fish. Pt states that she tried to eat 3 times per day, but did not always include protein.  Pt's weight appears stable over last year per wt readings. However, not recent weight reading available.  PO Intake: 10-25% x3 recorded meals   Medications reviewed and include: Ensure Enlive BID, Folvite, MVI, Thiamine  Labs reviewed: K+ 3.4 (L)   NUTRITION - FOCUSED PHYSICAL EXAM:    Most Recent Value  Orbital Region  Mild depletion  Upper Arm Region  Severe depletion  Thoracic and Lumbar Region  Severe depletion  Buccal Region  Moderate depletion  Temple Region  Moderate depletion  Clavicle Bone Region  Severe depletion  Clavicle and Acromion Bone Region  Severe depletion  Scapular Bone Region  Severe depletion  Dorsal Hand  Moderate depletion  Patellar Region  Severe depletion  Anterior Thigh  Region  Severe depletion  Posterior Calf Region  Severe depletion  Edema (RD Assessment)  None  Hair  Reviewed  Eyes  Reviewed  Mouth  Reviewed  Skin  Reviewed  Nails  Reviewed       Diet Order:   Diet Order            Diet - low sodium heart healthy        DIET DYS 3 Room service appropriate? Yes; Fluid consistency: Thin  Diet effective now              EDUCATION NEEDS:   No education needs have been identified at this time  Skin:  Skin Assessment: Skin Integrity Issues: Skin Integrity Issues:: Other (Comment) Other: pressure injury right buttocks  Last BM:  4/2  Height:   Ht Readings from Last 1 Encounters:  05/06/19 5\' 4"  (1.626 m)    Weight:   Wt Readings from Last 1 Encounters:  10/06/18 40.4 kg    BMI:  Body mass index is 15.3 kg/m.  Estimated Nutritional Needs:   Kcal:  1400-1600  Protein:  70-85 grams  Fluid:  >1.4 L/d    Larkin Ina, MS, RD, LDN RD pager number and weekend/on-call pager number located in [place].

## 2019-05-12 NOTE — Progress Notes (Signed)
Patient arrived to unit in good spirits and excited to begin therapy. Patient alert and oriented x4. Patient has a foam on bilateral arms covering scabbed wounds from a fall at home. Patient and daughter oriented to unit, room, and mask and fall policys.

## 2019-05-12 NOTE — Plan of Care (Signed)

## 2019-05-12 NOTE — Progress Notes (Signed)
Izora Ribas, MD  Physician  Physical Medicine and Rehabilitation  Consult Note      Signed  Date of Service:  05/08/2019  5:04 AM      Related encounter: ED to Hosp-Admission (Discharged) from 05/04/2019 in [facility]      Signed      Expand AllCollapse All            Physical Medicine and Rehabilitation Consult Reason for Consult: Bilateral lower extremity weakness Referring Physician: [facility]     HPI: Stacie [name] is a 64 y.o. right-handed female with history of hypertension, hyperlipidemia tobacco marijuana and tobacco use.  Per chart review reportedly lives with her ex-husband.  Independent prior to admission.  Two-level home bed and bath on main level 3 steps to entry.  Presented 05/04/2019 with bilateral lower extremity weakness and difficulty with ambulation with facial droop and dysarthria..  Cranial CT scan age-indeterminate infarcts within the right frontal lobe white matter and right basal ganglia possibly acute or subacute.  MRI 8 mm acute infarction posterior limb internal capsule on the right.  Punctate acute infarct either in the left caudate head or white matter adjacent to the frontal horn of the left lateral ventricle.  8 mm acute to subacute infarction in the white matter adjacent to the atrium of the right lateral ventricle.  Patient did not receive TPA.  CT angiogram head and neck no emergent findings.  Echocardiogram with ejection fraction 65% without emboli.  Admission chemistries unremarkable except potassium 3.3, alcohol negative, urine drug screen positive marijuana.  Placed on aspirin for CVA prophylaxis.  Subcutaneous Lovenox for DVT prophylaxis.  Maintain on mechanical soft diet.  Therapy evaluations completed with recommendations of physical medicine rehab consult.     Review of Systems  Constitutional: Negative for chills and fever.  HENT: Negative for hearing loss.   Eyes: Negative for double vision.  Respiratory: Negative for  shortness of breath.   Cardiovascular: Negative for chest pain, palpitations and leg swelling.  Gastrointestinal: Positive for constipation. Negative for heartburn, nausea and vomiting.  Genitourinary: Negative for dysuria, flank pain and hematuria.  Musculoskeletal: Positive for myalgias.  Skin: Negative for rash.  All other systems reviewed and are negative.       Past Medical History:  Diagnosis Date  . Colitis    . Hyperlipidemia    . Hypertension    . Stroke ([place]) 04/2012    unknown type  . Thyroid disease      History reviewed. No pertinent surgical history.      Family History  Problem Relation Age of Onset  . Stroke Mother 24        embolic  . Heart disease Father 72        CAd with CABG, no MI  . Heart disease Sister 2        CAD  . Heart disease Paternal Uncle    . Heart disease Maternal Grandmother    . Heart disease Paternal Grandfather    . Colon cancer Neg Hx    . Esophageal cancer Neg Hx    . Stomach cancer Neg Hx      Social History:  reports that she has been smoking. She has a 2.50 pack-year smoking history. She has never used smokeless tobacco. She reports current alcohol use of about 14.0 standard drinks of alcohol per week. She reports current drug use. Frequency: 7.00 times per week. Drug: Marijuana. Allergies:      Allergies  Allergen Reactions  . Trazodone And Nefazodone Nausea And Vomiting  . Wellbutrin [Bupropion] Nausea And Vomiting          Medications Prior to Admission  Medication Sig Dispense Refill  . amLODipine (NORVASC) 10 MG tablet Take 1 tablet (10 mg total) by mouth daily. 90 tablet 1  . doxylamine, Sleep, (UNISOM) 25 MG tablet Take 25 mg by mouth at bedtime as needed for sleep.       Marland Kitchen escitalopram (LEXAPRO) 20 MG tablet TAKE 1 TABLET(20 MG) BY MOUTH DAILY (Patient taking differently: Take 20 mg by mouth daily. ) 90 tablet 3  . diphenoxylate-atropine (LOMOTIL) 2.5-0.025 MG tablet Take 1 tablet by mouth 4 (four) times daily as  needed for diarrhea or loose stools. (Patient not taking: Reported on 10/06/2018) 100 tablet 1      Home: Home Living Family/patient expects to be discharged to:: Private residence Living Arrangements: Other (Comment)(ex husband) Available Help at Discharge: Family, Available 24 hours/day Type of Home: House Home Access: Stairs to enter [employer] of Steps: 3 Home Layout: Able to live on main level with bedroom/bathroom Bathroom Shower/Tub: Chiropodist: Standard Home Equipment: None Additional Comments: x2 daughters present reports ex spouse has 3 cats in the house but she does not care for animals   Functional History: Prior Function Level of Independence: Independent Functional Status:  Mobility: Bed Mobility Overal bed mobility: Needs Assistance Bed Mobility: Supine to Sit, Rolling, Sit to Supine Rolling: Min assist Supine to sit: Mod assist Sit to supine: Min assist General bed mobility comments: pt with immediate urgency to void bladder and becoming increasingly restless and agitated. pt pulling at bed to attempt to exit the bed.pt requires (A) for trunk elevation and pulling at bed rails pt voiding on toilet with BSC. pt requires (A) to scoot up in the bed upon return. pt fixated on return to bed Transfers Overall transfer level: Needs assistance Equipment used: 2 person hand held assist Transfers: Sit to/from Stand Sit to Stand: +2 physical assistance, Min assist General transfer comment: pt is able to power up but demonstrates L side weakness with heavy lean L. pt using R LE to balance Ambulation/Gait Ambulation/Gait assistance: Mod assist, +2 physical assistance, +2 safety/equipment Gait Distance (Feet): 15 Feet Assistive device: 2 person hand held assist Gait Pattern/deviations: Step-through pattern, Decreased stride length, Decreased stance time - left General Gait Details: Heavily reliant on external support, demonstrating left lateral  lean, handheld assist for guidance and environmental negotiation. ModA + 2 for balance   ADL: ADL Overall ADL's : Needs assistance/impaired Eating/Feeding: Moderate assistance, Bed level Eating/Feeding Details (indicate cue type and reason): needed encouragement to visually attend to cup to scoop out ice chips. pt motivated to eat ice so engaged in task.  Grooming: Wash/dry face, Minimal assistance, Bed level Grooming Details (indicate cue type and reason): pt pulling cool wash cloth off face and putting it back in a restless manner Lower Body Dressing Details (indicate cue type and reason): pt declines to don due to urgency and states "no you do it" Toilet Transfer: +2 for physical assistance, Moderate assistance, Ambulation, BSC Toileting- Clothing Manipulation and Hygiene: Moderate assistance, Sitting/lateral lean Functional mobility during ADLs: +2 for physical assistance, Moderate assistance General ADL Comments: pt very restless and overall impulsive at this time. pt needs cues to remain seated and not to pull at leads/ therapists   Cognition: Cognition Overall Cognitive Status: Impaired/Different from baseline Orientation Level: Oriented X4 Cognition Arousal/Alertness: Awake/alert Behavior During  Therapy: Impulsive, Anxious, Restless, Agitated Overall Cognitive Status: Impaired/Different from baseline Area of Impairment: Awareness, Safety/judgement, Following commands, Problem solving Following Commands: Follows multi-step commands inconsistently, Follows one step commands with increased time Safety/Judgement: Decreased awareness of deficits, Decreased awareness of safety Awareness: Emergent Problem Solving: Slow processing, Difficulty sequencing General Comments: pt needs cues for sequence at times. pt asking for ice chips and stating she needs to throw up at the same time. Pt oriented to location / person/ place and reason for admission. pt with lack of awareness to deficits     Blood pressure (!) 156/74, pulse (!) 59, temperature 98.3 F (36.8 C), temperature source Oral, resp. rate 16, SpO2 97 %. General: Alert and oriented x 3, No apparent distress. Feeling very cold.  HEENT: Head is normocephalic, atraumatic, diplopia Neck: Supple without JVD or lymphadenopathy Heart: Reg rate and rhythm. No murmurs rubs or gallops Chest: CTA bilaterally without wheezes, rales, or rhonchi; no distress Abdomen: Soft, non-tender, non-distended, bowel sounds positive. Extremities: No clubbing, cyanosis, or edema. Pulses are 2+ Skin: Clean and intact without signs of breakdown Neurological: Patient is alert in no acute distress.  Speech is a bit dysarthric.  Follows commands.  Oriented x3.  Fair awareness of deficits.  Musculoskeletal: 4+/5 strength throughout limited by weakness and fatigue, with the exceptio  Of distal left upper extremity which is 4-/5.  Psych: Pt's affect is appropriate. Pt is cooperative   Lab Results Last 24 Hours  Results for orders placed or performed during the hospital encounter of 05/04/19 (from the past 24 hour(s))  CBC with Differential     Status: Abnormal    Collection Time: 05/04/19  3:36 PM  Result Value Ref Range    WBC 10.0 4.0 - 10.5 K/uL    RBC 4.97 3.87 - 5.11 MIL/uL    Hemoglobin 14.0 12.0 - 15.0 g/dL    HCT 44.8 36.0 - 46.0 %    MCV 90.1 80.0 - 100.0 fL    MCH 28.2 26.0 - 34.0 pg    MCHC 31.3 30.0 - 36.0 g/dL    RDW 13.5 11.5 - 15.5 %    Platelets 310 150 - 400 K/uL    nRBC 0.0 0.0 - 0.2 %    Neutrophils Relative % 80 %    Neutro Abs 8.0 (H) 1.7 - 7.7 K/uL    Lymphocytes Relative 13 %    Lymphs Abs 1.3 0.7 - 4.0 K/uL    Monocytes Relative 7 %    Monocytes Absolute 0.7 0.1 - 1.0 K/uL    Eosinophils Relative 0 %    Eosinophils Absolute 0.0 0.0 - 0.5 K/uL    Basophils Relative 0 %    Basophils Absolute 0.0 0.0 - 0.1 K/uL    Immature Granulocytes 0 %    Abs Immature Granulocytes 0.03 0.00 - 0.07 K/uL  Comprehensive metabolic  panel     Status: Abnormal    Collection Time: 05/04/19  3:36 PM  Result Value Ref Range    Sodium 139 135 - 145 mmol/L    Potassium 3.3 (L) 3.5 - 5.1 mmol/L    Chloride 103 98 - 111 mmol/L    CO2 22 22 - 32 mmol/L    Glucose, Bld 109 (H) 70 - 99 mg/dL    BUN 16 8 - 23 mg/dL    Creatinine, Ser 0.81 0.44 - 1.00 mg/dL    Calcium 9.1 8.9 - 10.3 mg/dL    Total Protein 6.9 6.5 - 8.1 g/dL  Albumin 4.1 3.5 - 5.0 g/dL    AST 25 15 - 41 U/L    ALT 16 0 - 44 U/L    Alkaline Phosphatase 81 38 - 126 U/L    Total Bilirubin 1.0 0.3 - 1.2 mg/dL    GFR calc non Af Amer >60 >60 mL/min    GFR calc Af Amer >60 >60 mL/min    Anion gap 14 5 - 15  TSH     Status: None    Collection Time: 05/04/19  3:36 PM  Result Value Ref Range    TSH 1.609 0.350 - 4.500 uIU/mL  Ethanol     Status: None    Collection Time: 05/04/19  3:36 PM  Result Value Ref Range    Alcohol, Ethyl (B) <10 <10 mg/dL  Lipase, blood     Status: None    Collection Time: 05/04/19  3:36 PM  Result Value Ref Range    Lipase 26 11 - 51 U/L  Lactic acid, plasma     Status: None    Collection Time: 05/04/19  4:00 PM  Result Value Ref Range    Lactic Acid, Venous 0.9 0.5 - 1.9 mmol/L  Urine culture     Status: Abnormal (Preliminary result)    Collection Time: 05/04/19  5:22 PM    Specimen: Urine, Random  Result Value Ref Range    Specimen Description URINE, RANDOM      Special Requests NONE      Culture (A)        70,000 COLONIES/mL ESCHERICHIA COLI SUSCEPTIBILITIES TO FOLLOW Performed at [facility], [place] [address] [place], [place] [address]      Report Status PENDING    Rapid urine drug screen (hospital performed)     Status: Abnormal    Collection Time: 05/04/19  5:30 PM  Result Value Ref Range    Opiates NONE DETECTED NONE DETECTED    Cocaine NONE DETECTED NONE DETECTED    Benzodiazepines NONE DETECTED NONE DETECTED    Amphetamines NONE DETECTED NONE DETECTED    Tetrahydrocannabinol POSITIVE (A) NONE DETECTED     Barbiturates NONE DETECTED NONE DETECTED  Urinalysis, Routine w reflex microscopic     Status: Abnormal    Collection Time: 05/04/19  5:30 PM  Result Value Ref Range    Color, Urine YELLOW YELLOW    APPearance HAZY (A) CLEAR    Specific Gravity, Urine 1.024 1.005 - 1.030    pH 5.0 5.0 - 8.0    Glucose, UA NEGATIVE NEGATIVE mg/dL    Hgb urine dipstick NEGATIVE NEGATIVE    Bilirubin Urine NEGATIVE NEGATIVE    Ketones, ur 80 (A) NEGATIVE mg/dL    Protein, ur 100 (A) NEGATIVE mg/dL    Nitrite NEGATIVE NEGATIVE    Leukocytes,Ua NEGATIVE NEGATIVE    RBC / HPF 0-5 0 - 5 RBC/hpf    WBC, UA 0-5 0 - 5 WBC/hpf    Bacteria, UA RARE (A) NONE SEEN    Squamous Epithelial / LPF 0-5 0 - 5    Mucus PRESENT      Hyaline Casts, UA PRESENT    SARS CORONAVIRUS 2 (TAT 6-24 HRS) Nasopharyngeal Nasopharyngeal Swab     Status: None    Collection Time: 05/04/19 10:46 PM    Specimen: Nasopharyngeal Swab  Result Value Ref Range    SARS Coronavirus 2 NEGATIVE NEGATIVE  Hemoglobin A1c     Status: None    Collection Time: 05/05/19  5:38 AM  Result  Value Ref Range    Hgb A1c MFr Bld 5.5 4.8 - 5.6 %    Mean Plasma Glucose 111.15 mg/dL  Lipid panel     Status: Abnormal    Collection Time: 05/05/19  5:38 AM  Result Value Ref Range    Cholesterol 198 0 - 200 mg/dL    Triglycerides 113 <150 mg/dL    HDL 50 >40 mg/dL    Total CHOL/HDL Ratio 4.0 RATIO    VLDL 23 0 - 40 mg/dL    LDL Cholesterol 125 (H) 0 - 99 mg/dL  HIV Antibody (routine testing w rflx)     Status: None    Collection Time: 05/05/19  5:38 AM  Result Value Ref Range    HIV Screen 4th Generation wRfx NON REACTIVE NON REACTIVE       Imaging Results (Last 48 hours)  CT ANGIO HEAD W OR WO CONTRAST   Result Date: 05/05/2019 CLINICAL DATA:  Stroke follow-up EXAM: CT ANGIOGRAPHY HEAD AND NECK TECHNIQUE: Multidetector CT imaging of the head and neck was performed using the standard protocol during bolus administration of intravenous contrast.  Multiplanar CT image reconstructions and MIPs were obtained to evaluate the vascular anatomy. Carotid stenosis measurements (when applicable) are obtained utilizing NASCET criteria, using the distal internal carotid diameter as the denominator. CONTRAST:  168mL OMNIPAQUE IOHEXOL 350 MG/ML SOLN COMPARISON:  Brain MRI from yesterday FINDINGS: CTA NECK FINDINGS Aortic arch: Atherosclerotic plaque. No acute finding or dilatation. Three vessel branching. Right carotid system: Mixed density plaque at the common carotid bifurcation with up to 40% stenosis. No ulceration. There is ICA tortuosity with mild kinking. Left carotid system: Mixed density plaque at the bifurcation with bulb stenosis measuring up to 30%. No beading or ulceration. Vertebral arteries: Proximal subclavian atheromatous plaque on the left. The left vertebral artery is strongly dominant. Both vertebral arteries are widely patent to the dura. Skeleton: Prominent cervical facet spurring which is generalized and prominent. No acute or aggressive finding. Other neck: Negative Upper chest: Biapical pleural based scarring. Review of the MIP images confirms the above findings CTA HEAD FINDINGS Anterior circulation: Scattered atherosclerotic plaque on the carotid siphons without focal or flow reducing stenosis. No major branch occlusion. No aneurysm or vascular malformation. Posterior circulation: Strong left vertebral artery dominance. Most of right vertebral flow is to the PICA. Atheromatous irregularity of the bilateral posterior cerebral arteries which is mild to moderate. No branch occlusion or aneurysm. Venous sinuses: Unremarkable in the arterial phase. Anatomic variants: None significant Review of the MIP images confirms the above findings IMPRESSION: 1. No emergent finding. 2. Cervical and intracranial atherosclerosis without flow reducing stenosis or ulceration of major vessels. Electronically Signed   By: Monte Fantasia M.D.   On: 05/05/2019 04:34     CT Head Wo Contrast   Result Date: 05/04/2019 CLINICAL DATA:  Neuro deficit, acute, stroke suspected. Additional history provided: Patient transported via EMS from home for slurred speech and left-sided facial droop, symptoms developed Tuesday night (unknown time). History of stroke. EXAM: CT HEAD WITHOUT CONTRAST TECHNIQUE: Contiguous axial images were obtained from the base of the skull through the vertex without intravenous contrast. COMPARISON:  No pertinent prior studies available for comparison. FINDINGS: Brain: Mildly motion degraded examination. Age-indeterminate infarct within the right corona radiata/basal ganglia. There is an additional age-indeterminate infarct more anteriorly within the right basal ganglia also involve the adjacent deep right frontal white matter. There is no evidence of acute intracranial hemorrhage. No midline shift or extra-axial fluid  collection. Background mild ill-defined hypoattenuation within the cerebral white matter is nonspecific, but consistent with chronic small vessel ischemic disease. Mild generalized parenchymal atrophy. Vascular: No hyperdense vessel is identified. Atherosclerotic calcifications. Skull: Normal. Negative for fracture or focal lesion. Sinuses/Orbits: Visualized orbits demonstrate no acute abnormality. No significant paranasal sinus disease or mastoid effusion at the imaged levels. IMPRESSION: Mildly motion degraded examination. Age-indeterminate infarcts within the right frontal lobe white matter and right basal ganglia as described, possibly acute or subacute. Brain MRI is suggested for further evaluation. Background mild generalized parenchymal atrophy and chronic small vessel ischemic disease. Electronically Signed   By: Kellie Simmering DO   On: 05/04/2019 17:24    CT ANGIO NECK W OR WO CONTRAST   Result Date: 05/05/2019 CLINICAL DATA:  Stroke follow-up EXAM: CT ANGIOGRAPHY HEAD AND NECK TECHNIQUE: Multidetector CT imaging of the head and neck was  performed using the standard protocol during bolus administration of intravenous contrast. Multiplanar CT image reconstructions and MIPs were obtained to evaluate the vascular anatomy. Carotid stenosis measurements (when applicable) are obtained utilizing NASCET criteria, using the distal internal carotid diameter as the denominator. CONTRAST:  155mL OMNIPAQUE IOHEXOL 350 MG/ML SOLN COMPARISON:  Brain MRI from yesterday FINDINGS: CTA NECK FINDINGS Aortic arch: Atherosclerotic plaque. No acute finding or dilatation. Three vessel branching. Right carotid system: Mixed density plaque at the common carotid bifurcation with up to 40% stenosis. No ulceration. There is ICA tortuosity with mild kinking. Left carotid system: Mixed density plaque at the bifurcation with bulb stenosis measuring up to 30%. No beading or ulceration. Vertebral arteries: Proximal subclavian atheromatous plaque on the left. The left vertebral artery is strongly dominant. Both vertebral arteries are widely patent to the dura. Skeleton: Prominent cervical facet spurring which is generalized and prominent. No acute or aggressive finding. Other neck: Negative Upper chest: Biapical pleural based scarring. Review of the MIP images confirms the above findings CTA HEAD FINDINGS Anterior circulation: Scattered atherosclerotic plaque on the carotid siphons without focal or flow reducing stenosis. No major branch occlusion. No aneurysm or vascular malformation. Posterior circulation: Strong left vertebral artery dominance. Most of right vertebral flow is to the PICA. Atheromatous irregularity of the bilateral posterior cerebral arteries which is mild to moderate. No branch occlusion or aneurysm. Venous sinuses: Unremarkable in the arterial phase. Anatomic variants: None significant Review of the MIP images confirms the above findings IMPRESSION: 1. No emergent finding. 2. Cervical and intracranial atherosclerosis without flow reducing stenosis or ulceration  of major vessels. Electronically Signed   By: Monte Fantasia M.D.   On: 05/05/2019 04:34    MR BRAIN WO CONTRAST   Result Date: 05/04/2019 CLINICAL DATA:  Left facial droop and dysarthria noted over the last 3 days. EXAM: MRI HEAD WITHOUT CONTRAST TECHNIQUE: Multiplanar, multiecho pulse sequences of the brain and surrounding structures were obtained without intravenous contrast. COMPARISON:  Head CT same day FINDINGS: Brain: Motion degraded study. No acute finding affects the brainstem or cerebellum. 8 mm acute infarction in the posterior limb internal capsule on the right. Punctate acute infarction in the left caudate head or white matter adjacent to the frontal horn of the lateral ventricle. 8 mm acute to subacute infarction in the white matter adjacent to the atrium of the right lateral ventricle. Other white matter disease seen at CT is not acute or subacute. Old right basal ganglia infarction. No hydrocephalus. No sign of gross hemorrhage. No extra-axial collection. IMPRESSION: Diffusion only study is degraded by motion. 8 mm acute infarction  in the posterior limb internal capsule on the right. Punctate acute infarction either in the left caudate head or white matter adjacent to the frontal horn of the left lateral ventricle. 8 mm acute to subacute infarction in the white matter adjacent to the atrium of the right lateral ventricle. Old small vessel infarctions elsewhere affecting the hemispheric white matter and right basal ganglia. Electronically Signed   By: Nelson Chimes M.D.   On: 05/04/2019 20:58    ECHOCARDIOGRAM COMPLETE   Result Date: 05/05/2019    ECHOCARDIOGRAM REPORT   Patient Name:   ASHARIE SRIVASTAVA Date of Exam: 05/05/2019 Medical Rec #:  [id number]       Height:       64.6 in Accession #:    [id number]      Weight:       89.1 lb Date of Birth:  02-Oct-1955        BSA:          1.396 m Patient Age:    89 years        BP:           156/74 mmHg Patient Gender: F               HR:           59 bpm.  Exam Location:  Inpatient Procedure: 2D Echo Indications:    TIA 435.9 / G45.9  History:        Patient has no prior history of Echocardiogram examinations.                 Stroke; Risk Factors:Dyslipidemia and Hypertension.  Sonographer:    Vikki Ports T[place] Referring Phys: [id number] JAN A [place]  1. Normal LV systolic function; grade 1 diastolic dysfunction.  2. Left ventricular ejection fraction, by estimation, is 60 to 65%. The left ventricle has normal function. The left ventricle has no regional wall motion abnormalities. Left ventricular diastolic parameters are consistent with Grade I diastolic dysfunction (impaired relaxation).  3. Right ventricular systolic function is normal. The right ventricular size is normal.  4. The mitral valve is normal in structure. Trivial mitral valve regurgitation. No evidence of mitral stenosis.  5. The aortic valve is tricuspid. Aortic valve regurgitation is not visualized. No aortic stenosis is present.  6. The inferior vena cava is normal in size with greater than 50% respiratory variability, suggesting right atrial pressure of 3 mmHg. FINDINGS  Left Ventricle: Left ventricular ejection fraction, by estimation, is 60 to 65%. The left ventricle has normal function. The left ventricle has no regional wall motion abnormalities. The left ventricular internal cavity size was normal in size. There is  no left ventricular hypertrophy. Left ventricular diastolic parameters are consistent with Grade I diastolic dysfunction (impaired relaxation). Right Ventricle: The right ventricular size is normal. Right ventricular systolic function is normal. Left Atrium: Left atrial size was normal in size. Right Atrium: Right atrial size was normal in size. Pericardium: There is no evidence of pericardial effusion. Mitral Valve: The mitral valve is normal in structure. Normal mobility of the mitral valve leaflets. Trivial mitral valve regurgitation. No evidence of mitral valve  stenosis. Tricuspid Valve: The tricuspid valve is normal in structure. Tricuspid valve regurgitation is trivial. No evidence of tricuspid stenosis. Aortic Valve: The aortic valve is tricuspid. Aortic valve regurgitation is not visualized. No aortic stenosis is present. Aortic valve mean gradient measures 4.0 mmHg. Aortic valve peak gradient measures 8.0 mmHg. Aortic valve area, by VTI measures  2.11 cm. Pulmonic Valve: The pulmonic valve was normal in structure. Pulmonic valve regurgitation is not visualized. No evidence of pulmonic stenosis. Aorta: The aortic root is normal in size and structure. Venous: The inferior vena cava is normal in size with greater than 50% respiratory variability, suggesting right atrial pressure of 3 mmHg.  Additional Comments: Normal LV systolic function; grade 1 diastolic dysfunction.  LEFT VENTRICLE PLAX 2D LVIDd:         3.90 cm  Diastology LVIDs:         2.60 cm  LV e' lateral:   10.80 cm/s LV PW:         0.90 cm  LV E/e' lateral: 8.6 LV IVS:        0.90 cm  LV e' medial:    7.07 cm/s LVOT diam:     1.90 cm  LV E/e' medial:  13.2 LV SV:         72 LV SV Index:   52 LVOT Area:     2.84 cm  RIGHT VENTRICLE RV S prime:     12.70 cm/s TAPSE (M-mode): 2.2 cm LEFT ATRIUM             Index LA diam:        2.80 cm 2.01 cm/m LA Vol (A2C):   35.4 ml 25.35 ml/m LA Vol (A4C):   38.9 ml 27.86 ml/m LA Biplane Vol: 39.7 ml 28.43 ml/m  AORTIC VALVE AV Area (Vmax):    2.27 cm AV Area (Vmean):   2.26 cm AV Area (VTI):     2.11 cm AV Vmax:           141.00 cm/s AV Vmean:          97.700 cm/s AV VTI:            0.342 m AV Peak Grad:      8.0 mmHg AV Mean Grad:      4.0 mmHg LVOT Vmax:         113.00 cm/s LVOT Vmean:        78.000 cm/s LVOT VTI:          0.255 m LVOT/AV VTI ratio: 0.75  AORTA Ao Root diam: 2.90 cm MITRAL VALVE MV Area (PHT): 2.66 cm    SHUNTS MV Decel Time: 285 msec    Systemic VTI:  0.26 m MV E velocity: 93.10 cm/s  Systemic Diam: 1.90 cm MV A velocity: 99.90 cm/s MV E/A  ratio:  0.93 Kirk Ruths MD Electronically signed by Kirk Ruths MD Signature Date/Time: 05/05/2019/2:17:46 PM    Final        Assessment/Plan: Diagnosis: R punctate left caudate infarct 1. Does the need for close, 24 hr/day medical supervision in concert with the patient's rehab needs make it unreasonable for this patient to be served in a less intensive setting? Yes 2. Co-Morbidities requiring supervision/potential complications: HTN, HLD, thyroid disease, colitis, depression, alcohol and marijuana abuse 3. Due to bladder management, bowel management, safety, skin/wound care, disease management, medication administration, pain management and patient education, does the patient require 24 hr/day rehab nursing? Yes 4. Does the patient require coordinated care of a physician, rehab nurse, therapy disciplines of PT, OT, SLP to address physical and functional deficits in the context of the above medical diagnosis(es)? Yes Addressing deficits in the following areas: balance, endurance, locomotion, strength, transferring, bowel/bladder control, bathing, dressing, feeding, grooming, toileting, speech, and psychosocial support 5. Can the patient actively participate in an intensive therapy program  of at least 3 hrs of therapy per day at least 5 days per week? Yes 6. The potential for patient to make measurable gains while on inpatient rehab is excellent 7. Anticipated functional outcomes upon discharge from inpatient rehab are modified independent  with PT, modified independent with OT, modified independent with SLP. 8. Estimated rehab length of stay to reach the above functional goals is: 6-7 days 9. Anticipated discharge destination: Home 10. Overall Rehab/Functional Prognosis: excellent   RECOMMENDATIONS: This patient's condition is appropriate for continued rehabilitative care in the following setting: CIR Patient has agreed to participate in recommended program. Yes Note that insurance prior  authorization may be required for reimbursement for recommended care.   Comment: Mrs. Hopf would be an excellent CIR candidate. She has home support of her ex-husband who is retired. Thank you for this consult. We will continue to follow in Mrs. Vanwagner' care.    Lavon Paganini Angiulli, PA-C 05/05/2019    I have personally performed a face to face diagnostic evaluation, including, but not limited to relevant history and physical exam findings, of this patient and developed relevant assessment and plan.  Additionally, I have reviewed and concur with the physician assistant's documentation above.   Leeroy Cha, MD        Revision History                     Routing History

## 2019-05-13 ENCOUNTER — Inpatient Hospital Stay (HOSPITAL_COMMUNITY): Payer: Medicare Other | Admitting: Physical Therapy

## 2019-05-13 ENCOUNTER — Inpatient Hospital Stay (HOSPITAL_COMMUNITY): Payer: Medicare Other | Admitting: Occupational Therapy

## 2019-05-13 ENCOUNTER — Inpatient Hospital Stay (HOSPITAL_COMMUNITY): Payer: Medicare Other | Admitting: Speech Pathology

## 2019-05-13 MED ORDER — HYDRALAZINE HCL 50 MG PO TABS
75.0000 mg | ORAL_TABLET | Freq: Three times a day (TID) | ORAL | Status: DC
  Administered 2019-05-13 – 2019-05-14 (×2): 75 mg via ORAL
  Filled 2019-05-13 (×2): qty 1

## 2019-05-13 NOTE — Evaluation (Signed)
Speech Language Pathology Assessment and Plan  Patient Details  Name: Stacie [name] MRN: [id number] Date of Birth: February 09, 1955  SLP Diagnosis: Cognitive Impairments;Dysarthria;Dysphagia  Rehab Potential: Excellent ELOS: 5-7 days    Today's Date: 05/13/2019 SLP Individual Time: 6644-0347 SLP Individual Time Calculation (min): 55 min   Problem List:  Patient Active Problem List   Diagnosis Date Noted  . 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[place]) 05/12/2019  . Underweight 05/05/2019  . Depression 05/05/2019  . Tobacco abuse 05/05/2019  . Marijuana use 05/05/2019  . Alcohol abuse 05/05/2019  . Chronic diastolic CHF (congestive heart failure) ([place]) 05/05/2019  . CVA (cerebral vascular accident) ([place]) 05/04/2019  . Chronic diarrhea 08/26/2018  . Loss of weight 08/26/2018  . PAD (peripheral artery disease) ([place]) 12/06/2017  . Claudication of right lower extremity ([place]) 12/06/2017  . Hyperlipidemia 02/28/2016  . Essential hypertension 02/21/2016  . 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 Hearing loss 02/21/2016   Past Medical History:  Past Medical History:  Diagnosis Date  . Colitis   . Hyperlipidemia   . Hypertension   . Stroke ([place]) 04/2012   unknown type  . Thyroid disease    Past Surgical History: History reviewed. No pertinent surgical history.  Assessment / Plan / Recommendation Clinical Impression Patient is a 64 year old right-handed female with history of hypertension, hyperlipidemia, tobacco abuse as well as marijuana. Presented on 05/04/2019 with bilateral lower extremity weakness and difficulty with ambulation with facial droop and dysarthria. Cranial CT scan age-indeterminate infarcts within the right frontal lobe white matter and right basal ganglia possibly acute or subacute. MRI showed a 8 mm acute infarction posterior limb internal capsule on the right. Punctate acute infarct either in the left caudate head or white matter adjacent to the  frontal horn of the left lateral ventricle. 8 mm acute to subacute infarct in the white matter adjacent to the atrium of the right lateral ventricle. Patient did not receive TPA. Therapy evaluations completed and patient was admitted for a comprehensive rehab program 05/12/19.  Patient administered the Cognistat and scored WFL on all subtests with the exception of calculations, however, patient reports baseline deficits with mathematics in which she utilizes compensatory strategies. Despite scoring WFL on a standardized assessment, mild cognitive deficits in anticipatory awareness, complex problem solving and attention to left field of environment/body were noted during informal tasks. Patient demonstrates a mild dysarthria due to left oral-motor weakness resulting in imprecise consonants, however, patient is ~90% intelligible with cues needed for a slow rate of speech and over-articulation. Mild-Moderate left oral-motor weakness also results in decreased bolus control resulting in 1 overt coughing episode with thin liquids via straw with mixed consistencies. Coughing was eliminated when patient's oral cavity was clear of solid textures. Prolonged mastication and mild oral residue was also noted with solid textures, therefore, recommend patient remain on Dys. 3 textures. Patient would benefit from skilled SLP intervention to maximize her swallowing, speech and cognitive functioning prior to discharge home.    Skilled Therapeutic Interventions          Administered a cognitive-linguistic evaluation and BSE, please see above for details.   SLP Assessment  Patient will need skilled Speech Lanaguage Pathology Services during CIR admission    Recommendations  SLP Diet Recommendations: Dysphagia 3 (Mech soft);Thin Liquid Administration via: Straw Medication Administration: Whole meds with liquid(large pills: whole in puree, small pills: whole with thin) Supervision: Patient able to self feed Compensations:  Minimize environmental distractions;Slow rate;Small sips/bites Postural Changes and/or Swallow Maneuvers:  Seated upright 90 degrees Oral Care Recommendations: Oral care BID Recommendations for Other Services: Neuropsych consult Patient destination: Home Follow up Recommendations: Outpatient SLP(intermittent vs 24 hour supervision at home) Equipment Recommended: None recommended by SLP    SLP Frequency 3 to 5 out of 7 days   SLP Duration  SLP Intensity  SLP Treatment/Interventions 5-7 days  Minumum of 1-2 x/day, 30 to 90 minutes  Cognitive remediation/compensation;Dysphagia/aspiration precaution training;Internal/external aids;Speech/Language facilitation;Therapeutic Activities;Environmental controls;Cueing hierarchy;Functional tasks;Patient/family education    Pain No/Denies Pain   Prior Functioning Type of Home: House  Lives With: Other (Comment)(ex-husband [name]) Available Help at Discharge: Family;Available 24 hours/day  SLP Evaluation Cognition Overall Cognitive Status: Impaired/Different from baseline Arousal/Alertness: Awake/alert Orientation Level: Oriented X4 Focused Attention: Appears intact Sustained Attention: Appears intact Memory: Appears intact Immediate Memory Recall: Sock;Blue;Bed Memory Recall Sock: Without Cue Memory Recall Blue: Without Cue Memory Recall Bed: Without Cue Awareness: Impaired Awareness Impairment: Anticipatory impairment Problem Solving: Impaired Problem Solving Impairment: Functional complex Safety/Judgment: Impaired  Comprehension Auditory Comprehension Overall Auditory Comprehension: Appears within functional limits for tasks assessed Visual Recognition/Discrimination Discrimination: Not tested Reading Comprehension Reading Status: Not tested Expression Expression Primary Mode of Expression: Verbal Verbal Expression Overall Verbal Expression: Appears within functional limits for tasks assessed Written Expression Dominant  Hand: Right Written Expression: Not tested Oral Motor Oral Motor/Sensory Function Overall Oral Motor/Sensory Function: Moderate impairment Facial ROM: Reduced left;Suspected CN VII (facial) dysfunction Facial Symmetry: Abnormal symmetry left;Suspected CN VII (facial) dysfunction Facial Strength: Reduced left;Suspected CN VII (facial) dysfunction Facial Sensation: Within Functional Limits Lingual ROM: Reduced left;Suspected CN XII (hypoglossal) dysfunction Lingual Symmetry: Abnormal symmetry left;Suspected CN XII (hypoglossal) dysfunction Lingual Strength: Reduced;Suspected CN XII (hypoglossal) dysfunction Lingual Sensation: Within Functional Limits Velum: Within Functional Limits Mandible: Within Functional Limits Motor Speech Overall Motor Speech: Impaired Respiration: Within functional limits Phonation: Normal Resonance: Within functional limits Articulation: Impaired Level of Impairment: Sentence Intelligibility: Intelligibility reduced Word: 75-100% accurate Phrase: 75-100% accurate Sentence: 75-100% accurate Conversation: 75-100% accurate Motor Planning: Witnin functional limits Effective Techniques: Slow rate;Over-articulate  Bedside Swallowing Assessment General Date of Onset: 05/04/19 Previous Swallow Assessment: N/A Diet Prior to this Study: Dysphagia 3 (soft);Thin liquids Temperature Spikes Noted: No Respiratory Status: Room air History of Recent Intubation: No Behavior/Cognition: Alert;Cooperative;Pleasant mood Oral Cavity - Dentition: Adequate natural dentition Self-Feeding Abilities: Able to feed self Patient Positioning: Upright in bed Baseline Vocal Quality: Normal Volitional Cough: Strong Volitional Swallow: Able to elicit  Ice Chips Ice chips: Not tested Thin Liquid Thin Liquid: Impaired Presentation: Self Fed;Straw Oral Phase Functional Implications: Left anterior spillage Pharyngeal  Phase Impairments: Cough - Immediate Nectar Thick Nectar Thick  Liquid: Not tested Honey Thick Honey Thick Liquid: Not tested Puree Puree: Impaired Presentation: Self Fed;Spoon Oral Phase Impairments: Reduced labial seal Solid Solid: Impaired Presentation: Self Fed;Spoon Oral Phase Impairments: Impaired mastication Oral Phase Functional Implications: Impaired mastication BSE Assessment Risk for Aspiration Impact on safety and function: Mild aspiration risk  Short Term Goals: Week 1: SLP Short Term Goal 1 (Week 1): STGs=LTGS due to ELOS  Refer to Care Plan for Long Term Goals  Recommendations for other services: Neuropsych  Discharge Criteria: Patient will be discharged from SLP if patient refuses treatment 3 consecutive times without medical reason, if treatment goals not met, if there is a change in medical status, if patient makes no progress towards goals or if patient is discharged from hospital.  The above assessment, treatment plan, treatment alternatives and goals were discussed and mutually agreed upon: by patient  Ezelle Surprenant,  Dayra Rapley 05/13/2019, 1:55 PM

## 2019-05-13 NOTE — Plan of Care (Signed)
Problem: RH Balance Goal: LTG Patient will maintain dynamic standing with ADLs (OT) Description: LTG:  Patient will maintain dynamic standing balance with assist during activities of daily living (OT)  05/13/2019 1620 by Lucila Maine A, OT Flowsheets (Taken 05/13/2019 1620) LTG: Pt will maintain dynamic standing balance during ADLs with: Supervision/Verbal cueing 05/13/2019 1618 by Lucila Maine A, OT Flowsheets (Taken 05/13/2019 1615) LTG: Pt will maintain dynamic standing balance during ADLs with: Independent with assistive device   Problem: Sit to Stand Goal: LTG:  Patient will perform sit to stand in prep for activites of daily living with assistance level (OT) Description: LTG:  Patient will perform sit to stand in prep for activites of daily living with assistance level (OT) 05/13/2019 1620 by Lucila Maine A, OT Flowsheets (Taken 05/13/2019 1620) LTG: PT will perform sit to stand in prep for activites of daily living with assistance level: Supervision/Verbal cueing 05/13/2019 1618 by Lucila Maine A, OT Flowsheets (Taken 05/13/2019 1615) LTG: PT will perform sit to stand in prep for activites of daily living with assistance level: Independent with assistive device   Problem: RH Grooming Goal: LTG Patient will perform grooming w/assist,cues/equip (OT) Description: LTG: Patient will perform grooming with assist, with/without cues using equipment (OT) 05/13/2019 1620 by Lucila Maine A, OT Flowsheets (Taken 05/13/2019 1620) LTG: Pt will perform grooming with assistance level of: Set up assist  05/13/2019 1618 by Lucila Maine A, OT Flowsheets (Taken 05/13/2019 1615) LTG: Pt will perform grooming with assistance level of: Independent with assistive device    Problem: RH Bathing Goal: LTG Patient will bathe all body parts with assist levels (OT) Description: LTG: Patient will bathe all body parts with assist levels (OT) 05/13/2019 1620 by Lucila Maine A,  OT Flowsheets (Taken 05/13/2019 1620) LTG: Pt will perform bathing with assistance level/cueing: Set up assist  05/13/2019 1618 by Lucila Maine A, OT Flowsheets (Taken 05/13/2019 1615) LTG: Pt will perform bathing with assistance level/cueing: Independent with assistive device    Problem: RH Dressing Goal: LTG Patient will perform upper body dressing (OT) Description: LTG Patient will perform upper body dressing with assist, with/without cues (OT). 05/13/2019 1620 by Lucila Maine A, OT Flowsheets (Taken 05/13/2019 1620) LTG: Pt will perform upper body dressing with assistance level of: Set up assist 05/13/2019 1618 by Lucila Maine A, OT Flowsheets (Taken 05/13/2019 1615) LTG: Pt will perform upper body dressing with assistance level of: Independent with assistive device Goal: LTG Patient will perform lower body dressing w/assist (OT) Description: LTG: Patient will perform lower body dressing with assist, with/without cues in positioning using equipment (OT) 05/13/2019 1620 by Lucila Maine A, OT Flowsheets (Taken 05/13/2019 1620) LTG: Pt will perform lower body dressing with assistance level of: Supervision/Verbal cueing 05/13/2019 1618 by Lucila Maine A, OT Flowsheets (Taken 05/13/2019 1615) LTG: Pt will perform lower body dressing with assistance level of: Independent with assistive device   Problem: RH Toileting Goal: LTG Patient will perform toileting task (3/3 steps) with assistance level (OT) Description: LTG: Patient will perform toileting task (3/3 steps) with assistance level (OT)  Flowsheets (Taken 05/13/2019 1615) LTG: Pt will perform toileting task (3/3 steps) with assistance level: Independent with assistive device   Problem: RH Toilet Transfers Goal: LTG Patient will perform toilet transfers w/assist (OT) Description: LTG: Patient will perform toilet transfers with assist, with/without cues using equipment (OT) 05/13/2019 1620 by Lucila Maine A,  OT Flowsheets (Taken 05/13/2019 1620) LTG: Pt will perform toilet transfers with assistance level of: Supervision/Verbal cueing 05/13/2019 1618  by Lucila Maine A, OT Flowsheets (Taken 05/13/2019 1615) LTG: Pt will perform toilet transfers with assistance level of: Independent with assistive device   Problem: RH Tub/Shower Transfers Goal: LTG Patient will perform tub/shower transfers w/assist (OT) Description: LTG: Patient will perform tub/shower transfers with assist, with/without cues using equipment (OT) 05/13/2019 1620 by Lucila Maine A, OT Flowsheets (Taken 05/13/2019 1620) LTG: Pt will perform tub/shower stall transfers with assistance level of: Supervision/Verbal cueing 05/13/2019 1618 by Lucila Maine A, OT Flowsheets (Taken 05/13/2019 1615) LTG: Pt will perform tub/shower stall transfers with assistance level of: Independent with assistive device

## 2019-05-13 NOTE — Evaluation (Signed)
Physical Therapy Assessment and Plan  Patient Details  Name: Stacie [name] MRN: [id number] Date of Birth: 02-Aug-1955  PT Diagnosis: Abnormal posture, Abnormality of gait, Ataxia, Ataxic gait, Coordination disorder, Hemiplegia non-dominant, Impaired cognition, Impaired sensation and Muscle weakness Rehab Potential: Excellent ELOS: 7-10 days   Today's Date: 05/13/2019 PT Individual Time: 1300-1410 PT Individual Time Calculation (min): 70 min    Problem List:  Patient Active Problem List   Diagnosis Date Noted  . 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 Underweight 05/05/2019  . Depression 05/05/2019  . Tobacco abuse 05/05/2019  . Marijuana use 05/05/2019  . Alcohol abuse 05/05/2019  . Chronic diastolic CHF (congestive heart failure) ([place]) 05/05/2019  . CVA (cerebral vascular accident) ([place]) 05/04/2019  . Chronic diarrhea 08/26/2018  . Loss of weight 08/26/2018  . PAD (peripheral artery disease) ([place]) 12/06/2017  . Claudication of right lower extremity ([place]) 12/06/2017  . Hyperlipidemia 02/28/2016  . Essential hypertension 02/21/2016  . 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 Hearing loss 02/21/2016    Past Medical History:  Past Medical History:  Diagnosis Date  . Colitis   . Hyperlipidemia   . Hypertension   . Stroke ([place]) 04/2012   unknown type  . Thyroid disease    Past Surgical History: History reviewed. No pertinent surgical history.  Assessment & Plan Clinical Impression: Patient is a 64 year old right-handed female with history of hypertension, hyperlipidemia, tobacco abuse as well as marijuana. Patient lives with her ex-husband. Independent prior to admission. Two-level home bed and bath on main level 3 steps to entry. Presented on 05/04/2019 with bilateral lower extremity weakness and difficulty with ambulation with facial droop and dysarthria. Cranial CT scan age-indeterminate infarcts within the right frontal lobe white  matter and right basal ganglia possibly acute or subacute. MRI showed a 8 mm acute infarction posterior limb internal capsule on the right. Punctate acute infarct either in the left caudate head or white matter adjacent to the frontal horn of the left lateral ventricle. 8 mm acute to subacute infarct in the white matter adjacent to the atrium of the right lateral ventricle. Patient did not receive TPA. CT angiogram head and neck no emergent findings. Echocardiogram with ejection fraction 65% without emboli. Admission chemistries unremarkable except potassium 3.3, alcohol negative, urine drug screen positive marijuana, urinalysis greater than 70,000 ESBL.  Patient transferred to CIR on 05/12/2019 .   Patient currently requires min with mobility secondary to muscle weakness and muscle joint tightness, decreased cardiorespiratoy endurance, unbalanced muscle activation and decreased coordination, decreased attention to left and decreased sitting balance, decreased standing balance, decreased postural control, hemiplegia and decreased balance strategies.  Prior to hospitalization, patient was independent  with mobility and lived with Other (Comment)(ex-husband [name]) in a House home.  Home access is 3Stairs to enter.  Patient will benefit from skilled PT intervention to maximize safe functional mobility, minimize fall risk and decrease caregiver burden for planned discharge home with intermittent assist.  Anticipate patient will benefit from follow up [facility] at discharge.  PT - End of Session Activity Tolerance: Tolerates 10 - 20 min activity with multiple rests Endurance Deficit: Yes PT Assessment Rehab Potential (ACUTE/IP ONLY): Excellent PT Barriers to Discharge: [place] home environment;Decreased caregiver support;Home environment access/layout;Wound Care PT Patient demonstrates impairments in the following area(s): Balance;Behavior;Endurance;Motor;Pain;Perception;Safety;Skin Integrity PT Transfers  Functional Problem(s): Bed Mobility;Bed to Chair;Car;Furniture;Floor PT Locomotion Functional Problem(s): Ambulation;Wheelchair Mobility;Stairs PT Plan PT Intensity: Minimum of 1-2 x/day ,45 to 90 minutes PT Frequency: 5 out of  7 days PT Duration Estimated Length of Stay: 7-10 days PT Treatment/Interventions: Ambulation/gait training;Functional mobility training;Discharge planning;Psychosocial support;Therapeutic Activities;Visual/perceptual remediation/compensation;Therapeutic Exercise;Wheelchair propulsion/positioning;Skin care/wound management;Neuromuscular re-education;Disease management/prevention;Balance/vestibular training;DME/adaptive equipment instruction;Cognitive remediation/compensation;Pain management;Splinting/orthotics;UE/LE Strength taining/ROM;UE/LE Coordination activities;Patient/family education;Functional electrical stimulation;Community reintegration;Stair training PT Transfers Anticipated Outcome(s): Mod I with LRAD PT Locomotion Anticipated Outcome(s): ambulatory with LRAD and supervision assist PT Recommendation Recommendations for Other Services: Neuropsych consult;Therapeutic Recreation consult Therapeutic Recreation Interventions: Stress management;Outing/community reintergration Follow Up Recommendations: Home health PT Patient destination: Home Equipment Recommended: To be determined  Skilled Therapeutic Intervention Pt received supine in bed and agreeable to PT. Supine>sit transfer without assist or cues on the L. PT instructed patient in PT Evaluation and initiated treatment intervention; see below for results. PT educated patient in [place], rehab potential, rehab goals, and discharge recommendations. Pt transported to rehab gym. Gait training without AD x 161f with min-CGA as listed below and pt noted to ambulate in high guard. Stair management with no UE x 2 steps and mod assist as listed. UE support then added and pt managed 12 steps with CGA-min assist. Patient  demonstrates increased fall risk as noted by score of   38/56 on Berg Balance Scale.  (<36= high risk for falls, close to 100%; 37-45 significant >80%; 46-51 moderate >50%; 52-55 lower >25%). Car transfer training with min assist for safety. Pt returned to room and performed stand pivot transfer to bed with min assist for safety. Pt left sitting EOB bed with call bell in reach and all needs met.           PT Evaluation Precautions/Restrictions Precautions Precautions: Fall Precaution Comments: Lt hemi General   Vital SignsTherapy Vitals BP: (!) 151/103 Pain   denies Home Living/Prior Functioning Home Living Available Help at Discharge: Family;Available 24 hours/day Type of Home: House Home Access: Stairs to enter [employer] Steps: 3 Entrance Stairs-Rails: Right;Left;Can reach both Home Layout: Able to live on main level with bedroom/bathroom Bathroom Shower/Tub: TChiropodist Standard  Lives With: Other (Comment)(ex-husband JSonia Side Prior Function Level of Independence: Independent with basic ADLs;Independent with homemaking with ambulation  Able to Take Stairs?: Yes Driving: Yes Comments: working on garden Vision/Perception  Vision - Assessment Eye Alignment: Within Functional Limits Alignment/Gaze Preference: Head turned Tracking/Visual Pursuits: Decreased smoothness of eye movement to LEFT superior field;Decreased smoothness of eye movement to LEFT inferior field;Decreased smoothness of horizontal tracking Saccades: Undershoots Perception Perception: Impaired Inattention/Neglect: Does not attend to left visual field;Does not attend to left side of body Praxis Praxis: Intact  Cognition Overall Cognitive Status: Impaired/Different from baseline Arousal/Alertness: Awake/alert Orientation Level: Oriented X4 Focused Attention: Appears intact Sustained Attention: Appears intact Memory: Appears intact Immediate Memory Recall:  Sock;Blue;Bed Memory Recall Sock: Without Cue Memory Recall Blue: Without Cue Memory Recall Bed: Without Cue Awareness: Impaired Awareness Impairment: Anticipatory impairment Problem Solving: Impaired Problem Solving Impairment: Functional complex Safety/Judgment: Impaired Sensation Sensation Light Touch: Appears Intact Proprioception: Appears Intact Coordination Gross Motor Movements are Fluid and Coordinated: No Fine Motor Movements are Fluid and Coordinated: No Coordination and Movement Description: Mild Lt hemiplegia Finger Nose Finger Test: Decreased speed on Lt side compared to Rt Heel Shin Test: mild L sided ataxia 9 Hole Peg Test: 26 seconds Rt; 44 seconds Lt Motor  Motor Motor: Hemiplegia;Ataxia Motor - Skilled Clinical Observations: L side hemi and ataxia  Mobility Bed Mobility Bed Mobility: Rolling Right;Rolling Left;Sit to Supine;Supine to Sit Rolling Right: Independent with assistive device Rolling Left: Independent with assistive device Supine to Sit: Supervision/Verbal cueing Sit  to Supine: Supervision/Verbal cueing Transfers Transfers: Sit to Stand;Stand Pivot Transfers Sit to Stand: Minimal Assistance - Patient > 75% Stand Pivot Transfers: Minimal Assistance - Patient > 75% Transfer (Assistive device): None Locomotion  Gait Ambulation: Yes Gait Assistance: Minimal Assistance - Patient > 75% Gait Distance (Feet): 150 Feet Assistive device: None Gait Gait: Yes Gait Pattern: Impaired Gait Pattern: Ataxic;Wide base of support Stairs / Additional Locomotion Stairs: Yes Stairs Assistance: Moderate Assistance - Patient 50 - 74% Stair Management Technique: No rails Number of Stairs: 2 Height of Stairs: 8 Wheelchair Mobility Wheelchair Mobility: Yes Wheelchair Assistance: Minimal assistance - Patient >75% Wheelchair Propulsion: Both upper extremities Wheelchair Parts Management: Supervision/cueing Distance: 75  Trunk/Postural Assessment  Cervical  Assessment Cervical Assessment: Within Functional Limits Thoracic Assessment Thoracic Assessment: Exceptions to [facility] Lumbar Assessment Lumbar Assessment: Exceptions to [facility] Postural Control Postural Control: Deficits on evaluation(mild trunkal ataxia)  Balance Balance Balance Assessed: Yes Standardized Balance Assessment Standardized Balance Assessment: Berg Balance Test Berg Balance Test Sit to Stand: Able to stand  independently using hands Standing Unsupported: Able to stand safely 2 minutes Sitting with Back Unsupported but Feet Supported on Floor or Stool: Able to sit safely and securely 2 minutes Stand to Sit: Controls descent by using hands Transfers: Able to transfer safely, definite need of hands Standing Unsupported with Eyes Closed: Able to stand 10 seconds with supervision Standing Ubsupported with Feet Together: Needs help to attain position and unable to hold for 15 seconds From Standing, Reach Forward with Outstretched Arm: Can reach confidently >25 cm (10") From Standing Position, Pick up Object from Floor: Able to pick up shoe safely and easily From Standing Position, Turn to Look Behind Over each Shoulder: Looks behind one side only/other side shows less weight shift Turn 360 Degrees: Able to turn 360 degrees safely but slowly Standing Unsupported, Alternately Place Feet on Step/Stool: Able to complete >2 steps/needs minimal assist Standing Unsupported, One Foot in Front: Able to plae foot ahead of the other independently and hold 30 seconds Standing on One Leg: Tries to lift leg/unable to hold 3 seconds but remains standing independently Total Score: 38 Static Sitting Balance Static Sitting - Level of Assistance: 7: Independent Dynamic Sitting Balance Dynamic Sitting - Level of Assistance: 5: Stand by assistance Static Standing Balance Static Standing - Balance Support: No upper extremity supported Static Standing - Level of Assistance: 5: Stand by  assistance Dynamic Standing Balance Dynamic Standing - Balance Support: No upper extremity supported;During functional activity Dynamic Standing - Level of Assistance: 4: Min assist Extremity Assessment  RUE Assessment RUE Assessment: Within Functional Limits Active Range of Motion (AROM) Comments: WNL General Strength Comments: 46lbs hand strength via dynamometer testing LUE Assessment LUE Assessment: Within Functional Limits Active Range of Motion (AROM) Comments: WNL General Strength Comments: 31lbs hand strength via dynamometer testing RLE Assessment RLE Assessment: Within Functional Limits General Strength Comments: grossly 4+/5 LLE Assessment LLE Assessment: Within Functional Limits General Strength Comments: grossly 4+/5 and ataxia    Refer to Care Plan for Long Term Goals  Recommendations for other services: Neuropsych and Therapeutic Recreation  Stress management and Outing/community reintegration  Discharge Criteria: Patient will be discharged from PT if patient refuses treatment 3 consecutive times without medical reason, if treatment goals not met, if there is a change in medical status, if patient makes no progress towards goals or if patient is discharged from hospital.  The above assessment, treatment plan, treatment alternatives and goals were discussed and mutually agreed upon: by patient  Lorie Phenix 05/13/2019, 2:14 PM

## 2019-05-13 NOTE — Evaluation (Addendum)
Occupational Therapy Assessment and Plan  Patient Details  Name: Stacie [name] MRN: [id number] Date of Birth: 10-08-1955  OT Diagnosis: hemiplegia affecting non-dominant [name] and muscle weakness (generalized) Rehab Potential: Rehab Potential (ACUTE ONLY): Excellent ELOS: 5-7 days   Today's Date: 05/13/2019 OT Individual Time: 6861-6837 OT Individual Time Calculation (min): 73 min     Problem List:  Patient Active Problem List   Diagnosis Date Noted  . 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 Underweight 05/05/2019  . Depression 05/05/2019  . Tobacco abuse 05/05/2019  . Marijuana use 05/05/2019  . Alcohol abuse 05/05/2019  . Chronic diastolic CHF (congestive heart failure) ([place]) 05/05/2019  . CVA (cerebral vascular accident) ([place]) 05/04/2019  . Chronic diarrhea 08/26/2018  . Loss of weight 08/26/2018  . PAD (peripheral artery disease) ([place]) 12/06/2017  . Claudication of right lower extremity ([place]) 12/06/2017  . Hyperlipidemia 02/28/2016  . Essential hypertension 02/21/2016  . 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 Hearing loss 02/21/2016    Past Medical History:  Past Medical History:  Diagnosis Date  . Colitis   . Hyperlipidemia   . Hypertension   . Stroke ([place]) 04/2012   unknown type  . Thyroid disease    Past Surgical History: History reviewed. No pertinent surgical history.  Assessment & Plan Clinical Impression:   Stacie [name] is a 64 year old right-handed female with history of hypertension, hyperlipidemia, tobacco abuse as well as marijuana. Patient lives with her ex-husband. Independent prior to admission. Two-level home bed and bath on main level 3 steps to entry. Presented on 05/04/2019 with bilateral lower extremity weakness and difficulty with ambulation with facial droop and dysarthria. Cranial CT scan age-indeterminate infarcts within the right frontal lobe white matter and right basal ganglia possibly acute or  subacute. MRI showed a 8 mm acute infarction posterior limb internal capsule on the right. Punctate acute infarct either in the left caudate head or white matter adjacent to the frontal horn of the left lateral ventricle. 8 mm acute to subacute infarct in the white matter adjacent to the atrium of the right lateral ventricle. Patient did not receive TPA. CT angiogram head and neck no emergent findings. Echocardiogram with ejection fraction 65% without emboli. Admission chemistries unremarkable except potassium 3.3, alcohol negative, urine drug screen positive marijuana, urinalysis greater than 70,000 ESBL. Maintained on aspirin and Plavix for CVA prophylaxis x3 weeks then aspirin alone. Subcutaneous Lovenox for DVT prophylaxis. Therapy evaluations completed and patient was admitted for a comprehensive rehab program.  Patient currently requires min with basic self-care skills secondary to muscle weakness, decreased cardiorespiratoy endurance, motor apraxia and decreased coordination, decreased attention to left and decreased standing balance and hemiplegia.  Prior to hospitalization, patient could complete BADLs with independent .  Patient will benefit from skilled intervention to increase independence with basic self-care skills prior to discharge home with ex-husband Stacie [name].  Anticipate patient will require 24/7 supervision and follow up home health or follow up outpatient.  OT Assessment Rehab Potential (ACUTE ONLY): Excellent OT Barriers to Discharge: Medical stability OT Patient demonstrates impairments in the following area(s): Balance;Perception;Safety;Motor OT Basic ADL's Functional Problem(s): Grooming;Bathing;Dressing;Toileting OT Advanced ADL's Functional Problem(s): Simple Meal Preparation;Light Housekeeping OT Transfers Functional Problem(s): Toilet;Tub/Shower OT Additional Impairment(s): None OT Plan OT Intensity: Minimum of 1-2 x/day, 45 to 90 minutes OT Frequency: 5 out of 7  days OT Duration/Estimated Length of Stay: 5-7 days OT Treatment/Interventions: Balance/vestibular training;Discharge planning;Functional electrical stimulation;Pain management;Self Care/advanced ADL retraining;Therapeutic Activities;UE/LE Coordination activities;Visual/perceptual remediation/compensation;Therapeutic Exercise;Skin care/wound managment;Patient/family  education;Functional mobility training;Disease mangement/prevention;Community reintegration;DME/adaptive equipment instruction;Neuromuscular re-education;Psychosocial support;UE/LE Strength taining/ROM OT Self Feeding Anticipated Outcome(s): No goal OT Basic Self-Care Anticipated Outcome(s): supervision  OT Toileting Anticipated Outcome(s): supervision  OT Bathroom Transfers Anticipated Outcome(s): supervision  OT Recommendation Recommendations for Other Services: Neuropsych consult Patient destination: Home Follow Up Recommendations: Outpatient OT;Home health OT Equipment Recommended: To be determined Skilled Therapeutic Intervention Skilled OT session completed with focus on initial evaluation, education on OT role/POC, and establishment of patient-centered goals.   Pt greeted EOB with no c/o pain. Requesting to use the restroom. Ambulatory transfer to toilet completed using RW with CGA-supervision assist. Supervision assist for toileiting tasks with pt having a continent bladder void. She declined showering due to wounds on her arms, opting instead to complete sponge bathing sit<stand from EOB without device. Pt was very flexible with moving LB to meet demands of LB self care. Min vcs for locating ADL items placed on her Lt [name]. Min guard for dynamic standing balance. She ambulated to the sink to complete oral care without device and Min A, noted times where Lt LE lagged behind her. Pt aware of this, stating that she's telling the leg to move but it won't move correctly. Question if this is motor apraxia combined with Lt LE weakness.  Min A for ambulatory walk-in shower transfer without device as well. Had her participate in 9HPT and dynamometer strength testing at end of session, also gave her 2 Lt UE HEPs for room use. Pt appreciative. Left her with all needs within reach and bed alarm set.   OT Evaluation Precautions/Restrictions  Precautions Precautions: Fall Precaution Comments: Lt hemi Home Living/Prior [place] expects to be discharged to:: Private residence Living Arrangements: Other (Comment)(ex husband) Available Help at Discharge: Family, Available 24 hours/day Type of Home: House Home Access: Stairs to enter [employer] of Steps: 3 Home Layout: Able to live on main level with bedroom/bathroom Bathroom Shower/Tub: Chiropodist: Standard  Lives With: Other (Comment)(ex-husband [place]) IADL History Homemaking Responsibilities: Yes(Pt reported she did dusting and vacuuming PTA along with meal prep, ex-husband does the laundry and assists her with grocery shopping) Occupation: Retired, On disability 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[employer], also worked in Scientist, research (life sciences) estate Leisure and [place]: Spending time with her 3 cats Prior Function Level of Independence: Independent with basic ADLs, Independent with homemaking with ambulation ADL ADL Eating: Not assessed Grooming: Contact guard Where Assessed-Grooming: Standing at sink Upper Body Bathing: Setup Where Assessed-Upper Body Bathing: Edge of bed Lower Body Bathing: Contact guard Where Assessed-Lower Body Bathing: Edge of bed Upper Body Dressing: Setup Where Assessed-Upper Body Dressing: Edge of bed Lower Body Dressing: Contact guard Where Assessed-Lower Body Dressing: Edge of bed Toileting: Supervision/safety Where Assessed-Toileting: Glass blower/designer: Therapist, music Method: Ambulating(RW) Social research officer, government: Environmental education officer  Method: Ambulating(without AD) Youth worker: Other (comment)(3:1) Vision Baseline Vision/History: Wears glasses Wears Glasses: Reading only Patient Visual Report: No change from baseline Comments: Noted Rt gaze preference  Perception  Perception: Impaired Inattention/Neglect: Does not attend to left visual field Praxis Praxis: Intact Cognition Arousal/Alertness: Awake/alert Orientation Level: Place;Situation;Person Person: Oriented Place: Oriented Situation: Oriented Year: 2021 Month: April Day of Week: Correct Immediate Memory Recall: Sock;Blue;Bed Memory Recall Sock: Without Cue Memory Recall Blue: Without Cue Memory Recall Bed: Without Cue Awareness: Appears intact Safety/Judgment: Appears intact Sensation Coordination Gross Motor Movements are Fluid and Coordinated: No Fine Motor Movements are Fluid and Coordinated: No Coordination and  Movement Description: Mild Lt hemiplegia Finger Nose Finger Test: Decreased speed on Lt [name] compared to Rt 9 Hole Peg Test: 26 seconds Rt; 44 seconds Lt Motor  Motor Motor: Hemiplegia;Motor apraxia Mobility    Min A ambulatory shower transfer without AD, CGA ambulatory transfer using RW Balance Balance Balance Assessed: Yes Dynamic Sitting Balance Dynamic Sitting - Level of Assistance: 5: Stand by assistance(donning shoes EOB) Dynamic Standing Balance Dynamic Standing - Level of Assistance: 4: Min assist(walk-in shower transfer without AD) Extremity/Trunk Assessment RUE Assessment RUE Assessment: Within Functional Limits Active Range of Motion (AROM) Comments: WNL General Strength Comments: 46lbs hand strength via dynamometer testing LUE Assessment LUE Assessment: Within Functional Limits Active Range of Motion (AROM) Comments: WNL General Strength Comments: 31lbs hand strength via dynamometer testing     Refer to Care Plan for Long Term Goals  Recommendations for other services: Neuropsych   Discharge  Criteria: Patient will be discharged from OT if patient refuses treatment 3 consecutive times without medical reason, if treatment goals not met, if there is a change in medical status, if patient makes no progress towards goals or if patient is discharged from hospital.  The above assessment, treatment plan, treatment alternatives and goals were discussed and mutually agreed upon: by patient  Skeet Simmer 05/13/2019, 12:39 PM

## 2019-05-13 NOTE — Progress Notes (Signed)
[place] PHYSICAL MEDICINE & REHABILITATION PROGRESS NOTE   Subjective/Complaints: Has no complaints this morning. Asks why she has left sided inattention. Eager to perform exercises taught by OT Michaela in her down time K+ 3.4 yesterday  ROS: Denies shortness of breath, chest pain, malaise, diarrhea, constipation  Objective:   No results found. Recent Labs    05/12/19 1652  WBC 8.1  HGB 14.0  HCT 43.7  PLT 361   Recent Labs    05/11/19 0426 05/11/19 0426 05/12/19 0342 05/12/19 1652  NA 139  --  141  --   K 3.4*  --  3.4*  --   CL 102  --  102  --   CO2 26  --  27  --   GLUCOSE 108*  --  103*  --   BUN 14  --  15  --   CREATININE 0.73   < > 0.76 0.80  CALCIUM 9.0  --  9.2  --    < > = values in this interval not displayed.    Intake/Output Summary (Last 24 hours) at 05/13/2019 1632 Last data filed at 05/13/2019 0800 Gross per 24 hour  Intake 100 ml  Output 100 ml  Net 0 ml     Physical Exam: Vital Signs Blood pressure (!) 184/95, pulse 91, temperature 97.7 F (36.5 C), temperature source Oral, resp. rate 17, height 5\' 4"  (1.626 m), weight 43.8 kg, SpO2 99 %. Constitutional: She isoriented to person, place, and time.No distress.  HENT:  Head:Atraumatic.  Mouth/Throat:Oropharynx is clear and moist. Nooropharyngeal exudate.  Eyes:Pupils are equal, round, and reactive to light.Conjunctivaeare normal. Right eye exhibits no discharge. Left eye exhibitsno discharge.  Neck:No tracheal deviationpresent. No thyromegalypresent.  Cardiovascular:Normal rateand regular rhythm. Exam revealsno friction rub. No murmurheard. Respiratory:Effort normal. Norespiratory distress. She hasno wheezes. She hasno rales.  [id number]. She exhibitsno distension. There isno abdominal tenderness.  Musculoskeletal:  General: No tenderness,deformityor edema.  Cervical back: Normal range of motion.  Neurological: She isalertand oriented to person,  place, and time. Left central 7 and 12. Speech clear. Normal language. Reasonable insight and awareness. Mild left inattention. RUE and RLE 4+/5. LUE 4-/5 prox to distal. LLE 4/5. Normal LT and PP in all 4 limbs. DTR's 1+.  Skin: Skin iswarmand dry. She isnot diaphoretic. Healing,scabbed abrasions bilateral forearms with foam dressings in place Psychiatric: She has a normal mood and affect. Herbehavior is normal.Judgmentand thought contentnormal.     Assessment/Plan: 1. 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  Physiatrist is providing close team supervision and 24 hour management of active medical problems listed below.  Physiatrist and rehab team continue to assess barriers to discharge/monitor patient progress toward functional and medical goals  Care Tool:  Bathing    Body parts bathed by patient: Right arm, Left arm, Chest, Abdomen, Front perineal area, Buttocks, Right upper leg, Left upper leg, Right lower leg, Left lower leg, Face         Bathing assist Assist Level: Contact Guard/Touching assist     Upper Body Dressing/Undressing Upper body dressing   What is the patient wearing?: Bra, Pull over shirt    Upper body assist Assist Level: Set up assist    Lower Body Dressing/Undressing Lower body dressing      What is the patient wearing?: Underwear/pull up, Pants     Lower body assist Assist for lower body dressing: Contact Guard/Touching assist  Toileting Toileting    Toileting assist Assist for toileting: Supervision/Verbal cueing     Transfers Chair/bed transfer  Transfers assist     Chair/bed transfer assist level: Minimal Assistance - Patient > 75%     Locomotion Ambulation   Ambulation assist      Assist level: Minimal Assistance - Patient > 75% Assistive device: No Device Max distance: 150   Walk 10 feet  activity   Assist     Assist level: Minimal Assistance - Patient > 75%     Walk 50 feet activity   Assist    Assist level: Minimal Assistance - Patient > 75%      Walk 150 feet activity   Assist    Assist level: Minimal Assistance - Patient > 75%      Walk 10 feet on uneven surface  activity   Assist     Assist level: Minimal Assistance - Patient > 75%     Wheelchair     Assist Will patient use wheelchair at discharge?: No Type of Wheelchair: Manual    Wheelchair assist level: Minimal Assistance - Patient > 75% Max wheelchair distance: 75    Wheelchair 50 feet with 2 turns activity    Assist        Assist Level: Minimal Assistance - Patient > 75%   Wheelchair 150 feet activity     Assist      Assist Level: Moderate Assistance - Patient 50 - 74%   Blood pressure (!) 184/95, pulse 91, temperature 97.7 F (36.5 C), temperature source Oral, resp. rate 17, height 5\' 4"  (1.626 m), weight 43.8 kg, SpO2 99 %.    Medical Problem List and Plan: 1.Left-sided weakness with dysarthriasecondary to multiple small subcortical infarcts right punctate, left caudate secondary to small vessel disease -patient may shower -ELOS/Goals: 7-10 days, supervision to mod I  -Initial CIR evaluations today 2. Antithrombotics: -DVT/anticoagulation:4/10: ambulated greater than 150 feet so Lovenox discontinued; shots very bothersome to patient.  -antiplatelet therapy: Aspirin 81 mg daily and Plavix 75 mg daily x3 weeks then aspirin alone 3. Pain Management:Tylenol as needed 4. Mood:Continue Lexapro 20 mg daily Unisom for sleep -antipsychotic agents: N/A -in good spirits at present, team to provide ego support as needed. 5. Neuropsych: This patientiscapable of making decisions on herown behalf. 6. Skin/Wound Care:Routine skin checks 7. Fluids/Electrolytes/Nutrition:Routine in and outs with  follow-up chemistrieson Monday. 8. Hypertension. Norvasc 10 mg daily, hydralazine 50mg  every 8 hours.  -SBP elevated at present, follow for pattern and adjust regimen as needed  -BP elevated to 184/95 on 4/10: increased Hydralazine to 75mg  q8H.  9. History of alcohol/tobacco/marijuana use. Urine drug screen positive marijuana. Continue NicoDerm patch. Provide counseling 10. Hyperlipidemia. Zocor 11. UTI ESBL. Contact precautions ongoing  LOS: 1 days A FACE TO FACE EVALUATION WAS PERFORMED  Clide Deutscher Munir Victorian 05/13/2019, 4:32 PM

## 2019-05-13 NOTE — Plan of Care (Signed)
  Problem: Consults Goal: RH STROKE PATIENT EDUCATION Description: See Patient Education module for education specifics  Outcome: Progressing   Problem: RH SKIN INTEGRITY Goal: RH STG SKIN FREE OF INFECTION/BREAKDOWN Description: Pt will be free of skin breakdown with mod I assist  Outcome: Progressing Goal: RH STG ABLE TO PERFORM INCISION/WOUND CARE W/ASSISTANCE Description: STG Able To Perform Incision/Wound Care With mod I Assistance. Outcome: Progressing   Problem: RH SAFETY Goal: RH STG ADHERE TO SAFETY PRECAUTIONS W/ASSISTANCE/DEVICE Description: STG Adhere to Safety Precautions With cues/reminders Assistance/Device. Outcome: Progressing   Problem: RH KNOWLEDGE DEFICIT Goal: RH STG INCREASE KNOWLEDGE OF HYPERTENSION Description: Pt will be able to verbalize management of HTN with diet regimen and medication compliance with mod I assist using handouts/booklets Outcome: Progressing Goal: RH STG INCREASE KNOWLEDGE OF STROKE PROPHYLAXIS Description: Pt will be able to verbalize management of stroke prevention with diet regimen and medication compliance with mod I assist using handouts/booklets  Outcome: Progressing

## 2019-05-14 ENCOUNTER — Inpatient Hospital Stay (HOSPITAL_COMMUNITY): Payer: Medicare Other | Admitting: Occupational Therapy

## 2019-05-14 MED ORDER — HYDRALAZINE HCL 50 MG PO TABS
100.0000 mg | ORAL_TABLET | Freq: Three times a day (TID) | ORAL | Status: DC
  Administered 2019-05-14 – 2019-05-19 (×15): 100 mg via ORAL
  Filled 2019-05-14 (×16): qty 2

## 2019-05-14 NOTE — Progress Notes (Signed)
Area that was charted as a pressure injury & bruise was removed from Epic. Not noted on assessment & patient stated that it was from a fall. She was noted in bed this morning, easily aroused by voice. She walked to the restroom with stand by assist. She was a little unsteady, but reached out for hand rails & other object to steady her gait. She has skin tears on bilateral arms. Her blood pressure was elevated again this morning & she commented that it was usual for her. Hydralazine was given as ordered. No c/o pain, dizziness or headaches. She was eager to get back to bed. No acute distress noted, will continue to monitor

## 2019-05-14 NOTE — Progress Notes (Signed)
Occupational Therapy Session Note  Patient Details  Name: Stacie [name] MRN: [id number] Date of Birth: 1955-06-19  Today's Date: 05/14/2019 OT Individual Time: 1400-1500 OT Individual Time Calculation (min): 60 min    Short Term Goals: Week 1:  OT Short Term Goal 1 (Week 1): STGs=LTGs due to ELOS  Skilled Therapeutic Interventions/Progress Updates:    Upon entering the room, pt seated on EOB awaiting OT arrival. Pt with no c/o pain and requesting to use bathroom. Pt ambulating without use of AD to bathroom with CGA for toileting. Pt managed clothing and hygiene with CGA as well. Pt ambulating 150' to tub room with CGA and cuing for froward gaze. OT demonstrated transfer onto TTB with pt returning demonstration with CGA and reports, " I want one of these!" Pt engaged in dynavision task for 3 minutes standing with B UE and average reaction time of 1.73 seconds with no LOB and close supervision. Pt taking seated rest break and then standing on airex foam for 2 minutes with initially min A and pt very fearful but able to be close supervision by the second minute. Pt ambulating back towards room in same manner as above. Pt engaged in card task with L UE for card game with no drops from left hand but decreased speed noted with tasks. Pt returning back to room at end of session with call bell and all needed items within reach. Bed alarm activated.   Therapy Documentation Precautions:  Precautions Precautions: Fall Precaution Comments: Lt hemi Restrictions Weight Bearing Restrictions: No Vital Signs: Therapy Vitals Temp: 97.8 F (36.6 C) Pulse Rate: 87 Resp: 18 BP: (!) 155/82 Patient Position (if appropriate): Lying Oxygen Therapy SpO2: 100 % O2 Device: Room Air  ADL: ADL Eating: Not assessed Grooming: Contact guard Where Assessed-Grooming: Standing at sink Upper Body Bathing: Setup Where Assessed-Upper Body Bathing: Edge of bed Lower Body Bathing: Contact guard Where  Assessed-Lower Body Bathing: Edge of bed Upper Body Dressing: Setup Where Assessed-Upper Body Dressing: Edge of bed Lower Body Dressing: Contact guard Where Assessed-Lower Body Dressing: Edge of bed Toileting: Supervision/safety Where Assessed-Toileting: Glass blower/designer: Therapist, music Method: Ambulating(RW) Social research officer, government: Environmental education officer Method: Ambulating(without AD) Youth worker: Other (comment)(3:1)   Therapy/Group: Individual Therapy  Gypsy Decant 05/14/2019, 4:13 PM

## 2019-05-14 NOTE — Progress Notes (Signed)
[place] PHYSICAL MEDICINE & REHABILITATION PROGRESS NOTE   Subjective/Complaints: Has no complaints this morning. Hs been practicing exercises herself. Bright and pleasant affect.  Happy with her progress and that she will not need Lovenox shots.   ROS: Denies shortness of breath, chest pain, malaise, diarrhea, constipation  Objective:   No results found. Recent Labs    05/12/19 1652  WBC 8.1  HGB 14.0  HCT 43.7  PLT 361   Recent Labs    05/12/19 0342 05/12/19 1652  NA 141  --   K 3.4*  --   CL 102  --   CO2 27  --   GLUCOSE 103*  --   BUN 15  --   CREATININE 0.76 0.80  CALCIUM 9.2  --     Intake/Output Summary (Last 24 hours) at 05/14/2019 1159 Last data filed at 05/14/2019 0900 Gross per 24 hour  Intake 480 ml  Output --  Net 480 ml     Physical Exam: Vital Signs Blood pressure (!) 176/82, pulse 85, temperature 97.6 F (36.4 C), temperature source Oral, resp. rate 16, height 5\' 4"  (1.626 m), weight 43.8 kg, SpO2 98 %. Constitutional: She isoriented to person, place, and time.No distress.  HENT:  Head:Atraumatic.  Mouth/Throat:Oropharynx is clear and moist. Nooropharyngeal exudate.  Eyes:Pupils are equal, round, and reactive to light.Conjunctivaeare normal. Right eye exhibits no discharge. Left eye exhibitsno discharge.  Neck:No tracheal deviationpresent. No thyromegalypresent.  Cardiovascular:Normal rateand regular rhythm. Exam revealsno friction rub. No murmurheard. Respiratory:Effort normal. Norespiratory distress. She hasno wheezes. She hasno rales.  [id number]. She exhibitsno distension. There isno abdominal tenderness.  Musculoskeletal:  General: No tenderness,deformityor edema.  Cervical back: Normal range of motion.  Neurological: She isalertand oriented to person, place, and time. Left central 7 and 12. Speech clear. Normal language. Reasonable insight and awareness. Mild left inattention. RUE and RLE 5/5.  LUE 4/5 prox to distal. LLE 4/5. Normal LT and PP in all 4 limbs. DTR's 1+.  Skin: Skin iswarmand dry. She isnot diaphoretic. Healing,scabbed abrasions bilateral forearms with foam dressings in place Psychiatric: She has a normal mood and affect. Herbehavior is normal.Judgmentand thought contentnormal.   Assessment/Plan: 1. 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  Physiatrist is providing close team supervision and 24 hour management of active medical problems listed below.  Physiatrist and rehab team continue to assess barriers to discharge/monitor patient progress toward functional and medical goals  Care Tool:  Bathing    Body parts bathed by patient: Right arm, Left arm, Chest, Abdomen, Front perineal area, Buttocks, Right upper leg, Left upper leg, Right lower leg, Left lower leg, Face         Bathing assist Assist Level: Contact Guard/Touching assist     Upper Body Dressing/Undressing Upper body dressing   What is the patient wearing?: Bra, Pull over shirt    Upper body assist Assist Level: Set up assist    Lower Body Dressing/Undressing Lower body dressing      What is the patient wearing?: Underwear/pull up, Pants     Lower body assist Assist for lower body dressing: Contact Guard/Touching assist     Toileting Toileting    Toileting assist Assist for toileting: Contact Guard/Touching assist     Transfers Chair/bed transfer  Transfers assist     Chair/bed transfer assist level: Minimal Assistance - Patient > 75%     Locomotion Ambulation   Ambulation assist  Assist level: Minimal Assistance - Patient > 75% Assistive device: No Device Max distance: 150   Walk 10 feet activity   Assist     Assist level: Minimal Assistance - Patient > 75%     Walk 50 feet activity   Assist    Assist level: Minimal  Assistance - Patient > 75%      Walk 150 feet activity   Assist    Assist level: Minimal Assistance - Patient > 75%      Walk 10 feet on uneven surface  activity   Assist     Assist level: Minimal Assistance - Patient > 75%     Wheelchair     Assist Will patient use wheelchair at discharge?: No Type of Wheelchair: Manual    Wheelchair assist level: Minimal Assistance - Patient > 75% Max wheelchair distance: 75    Wheelchair 50 feet with 2 turns activity    Assist        Assist Level: Minimal Assistance - Patient > 75%   Wheelchair 150 feet activity     Assist      Assist Level: Moderate Assistance - Patient 50 - 74%   Blood pressure (!) 176/82, pulse 85, temperature 97.6 F (36.4 C), temperature source Oral, resp. rate 16, height 5\' 4"  (1.626 m), weight 43.8 kg, SpO2 98 %.    Medical Problem List and Plan: 1.Left-sided weakness with dysarthriasecondary to multiple small subcortical infarcts right punctate, left caudate secondary to small vessel disease -patient may shower -ELOS/Goals: 7-10 days, supervision to mod I  -Continue CIR PT, OT 2. Antithrombotics: -DVT/anticoagulation:4/10: ambulated greater than 150 feet so Lovenox discontinued; shots very bothersome to patient.  -antiplatelet therapy: Aspirin 81 mg daily and Plavix 75 mg daily x3 weeks then aspirin alone 3. Pain Management:Tylenol as needed, denies pain.  4. Mood:Continue Lexapro 20 mg daily Unisom for sleep. In very positive mood  -antipsychotic agents: N/A -in good spirits at present, team to provide ego support as needed. 5. Neuropsych: This patientiscapable of making decisions on herown behalf. 6. Skin/Wound Care:Routine skin checks 7. Fluids/Electrolytes/Nutrition:Routine in and outs with follow-up chemistrieson Monday. 8. Hypertension. Norvasc 10 mg daily, hydralazine 50mg  every 8 hours.   -SBP elevated at present, follow for pattern and adjust regimen as needed  -BP elevated to 184/95 on 4/10: increased Hydralazine to 75mg  q8H.   -BP continues to be elevated to 176/82; will increase Hydralazine to 100mg  q8H 9. History of alcohol/tobacco/marijuana use. Urine drug screen positive marijuana. Continue NicoDerm patch. Provide counseling 10. Hyperlipidemia. Zocor 11. UTI ESBL. Contact precautions ongoing  LOS: 2 days A FACE TO FACE EVALUATION WAS PERFORMED  Esten Dollar P Lekeisha Arenas 05/14/2019, 11:59 AM

## 2019-05-15 ENCOUNTER — Inpatient Hospital Stay (HOSPITAL_COMMUNITY): Payer: Medicare Other | Admitting: Occupational Therapy

## 2019-05-15 ENCOUNTER — Inpatient Hospital Stay (HOSPITAL_COMMUNITY): Payer: Medicare Other | Admitting: Speech Pathology

## 2019-05-15 ENCOUNTER — Inpatient Hospital Stay (HOSPITAL_COMMUNITY): Payer: Medicare Other | Admitting: Physical Therapy

## 2019-05-15 LAB — CBC WITH DIFFERENTIAL/PLATELET
Abs Immature Granulocytes: 0.02 10*3/uL (ref 0.00–0.07)
Basophils Absolute: 0 10*3/uL (ref 0.0–0.1)
Basophils Relative: 1 %
Eosinophils Absolute: 0.1 10*3/uL (ref 0.0–0.5)
Eosinophils Relative: 2 %
HCT: 38.8 % (ref 36.0–46.0)
Hemoglobin: 12.2 g/dL (ref 12.0–15.0)
Immature Granulocytes: 0 %
Lymphocytes Relative: 20 %
Lymphs Abs: 1.3 10*3/uL (ref 0.7–4.0)
MCH: 28.2 pg (ref 26.0–34.0)
MCHC: 31.4 g/dL (ref 30.0–36.0)
MCV: 89.6 fL (ref 80.0–100.0)
Monocytes Absolute: 0.6 10*3/uL (ref 0.1–1.0)
Monocytes Relative: 9 %
Neutro Abs: 4.6 10*3/uL (ref 1.7–7.7)
Neutrophils Relative %: 68 %
Platelets: 315 10*3/uL (ref 150–400)
RBC: 4.33 MIL/uL (ref 3.87–5.11)
RDW: 13.7 % (ref 11.5–15.5)
WBC: 6.6 10*3/uL (ref 4.0–10.5)
nRBC: 0 % (ref 0.0–0.2)

## 2019-05-15 LAB — COMPREHENSIVE METABOLIC PANEL
ALT: 34 U/L (ref 0–44)
AST: 33 U/L (ref 15–41)
Albumin: 3.6 g/dL (ref 3.5–5.0)
Alkaline Phosphatase: 51 U/L (ref 38–126)
Anion gap: 10 (ref 5–15)
BUN: 13 mg/dL (ref 8–23)
CO2: 25 mmol/L (ref 22–32)
Calcium: 9 mg/dL (ref 8.9–10.3)
Chloride: 105 mmol/L (ref 98–111)
Creatinine, Ser: 0.75 mg/dL (ref 0.44–1.00)
GFR calc Af Amer: >60 mL/min (ref >60)
GFR calc non Af Amer: >60 mL/min (ref >60)
Glucose, Bld: 102 mg/dL — ABNORMAL HIGH (ref 70–99)
Potassium: 3.4 mmol/L — ABNORMAL LOW (ref 3.5–5.1)
Sodium: 140 mmol/L (ref 135–145)
Total Bilirubin: 0.2 mg/dL — ABNORMAL LOW (ref 0.3–1.2)
Total Protein: 6.1 g/dL — ABNORMAL LOW (ref 6.5–8.1)

## 2019-05-15 MED ORDER — POTASSIUM CHLORIDE CRYS ER 20 MEQ PO TBCR
20.0000 meq | EXTENDED_RELEASE_TABLET | Freq: Every day | ORAL | Status: DC
  Administered 2019-05-15 – 2019-05-19 (×5): 20 meq via ORAL
  Filled 2019-05-15 (×5): qty 1

## 2019-05-15 MED ORDER — ENSURE ENLIVE PO LIQD
237.0000 mL | Freq: Three times a day (TID) | ORAL | Status: DC
  Administered 2019-05-16 – 2019-05-18 (×8): 237 mL via ORAL

## 2019-05-15 MED ORDER — ONDANSETRON HCL 4 MG PO TABS
4.0000 mg | ORAL_TABLET | Freq: Three times a day (TID) | ORAL | Status: DC | PRN
  Administered 2019-05-15 (×2): 4 mg via ORAL
  Filled 2019-05-15 (×2): qty 1

## 2019-05-15 NOTE — Progress Notes (Signed)
Speech Language Pathology Daily Session Note  Patient Details  Name: Stacie [name] MRN: [id number] Date of Birth: September 09, 1955  Today's Date: 05/15/2019 SLP Individual Time: 0831-0930 SLP Individual Time Calculation (min): 59 min  Short Term Goals: Week 1: SLP Short Term Goal 1 (Week 1): STGs=LTGS due to ELOS  Skilled Therapeutic Interventions: Pt was seen for skilled ST targeting dysphagia and cognitive goals. SLP provided upgraded regular solid texture trial to assess potential for advancement. Pt reported and demonstrated a moderate amount of effort required to clear pocketed POs and eventually trials were ceased at pt's request. No overt s/sx aspiration were observed with solids or liquids. Given degree of difficulty clearing regular textures from oral cavity, recommend continue current dysphagia 3 (mechanical soft) diet and thin liquids. SLP further facilitated session with a complex medication management activity. Pt recalled familiar medication names and functions with 90% accuracy with Supervision question cues, however names, functions, and dosages of medications that are new to this admission required SLP's total assistance for recall. Pt created a list of all medications with Supervision A for organization. She then organized a QID pill box according to her list with 100% accuracy and was Mod I for problem solving and organization throughout. She also demonstrated ability to alternate between conversation and pill box task and maintain accuracy with only Supervision A verbal cueing. Pt left sitting in bed with alarm set and needs within reach. Continue per current plan of care.         Pain Pain Assessment Pain Scale: 0-10 Pain Score: 0-No pain  Therapy/Group: Individual Therapy  Arbutus Leas 05/15/2019, 7:13 AM

## 2019-05-15 NOTE — Care Management (Signed)
Patient Details  Name: Stacie [name] MRN: [id number] Date of Birth: 15-Nov-1955  Today's Date: 05/15/2019  Problem List:  Patient Active Problem List   Diagnosis Date Noted  . 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[place]) 05/12/2019  . Underweight 05/05/2019  . Depression 05/05/2019  . Tobacco abuse 05/05/2019  . Marijuana use 05/05/2019  . Alcohol abuse 05/05/2019  . Chronic diastolic CHF (congestive heart failure) ([place]) 05/05/2019  . CVA (cerebral vascular accident) ([place]) 05/04/2019  . Chronic diarrhea 08/26/2018  . Loss of weight 08/26/2018  . PAD (peripheral artery disease) ([place]) 12/06/2017  . Claudication of right lower extremity ([place]) 12/06/2017  . Hyperlipidemia 02/28/2016  . Essential hypertension 02/21/2016  . 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 Hearing loss 02/21/2016   Past Medical History:  Past Medical History:  Diagnosis Date  . Colitis   . Hyperlipidemia   . Hypertension   . Stroke ([place]) 04/2012   unknown type  . Thyroid disease    Past Surgical History: History reviewed. No pertinent surgical history. Social History:  reports that she has been smoking. She has a 2.50 pack-year smoking history. She has never used smokeless tobacco. She reports current alcohol use of about 14.0 standard drinks of alcohol per week. She reports current drug use. Frequency: 7.00 times per week. Drug: Marijuana.  Family / Concow Patient Roles: Other (Comment), Parent(lived with ex-husband) Spouse/Significant Other: Ex-husband Children: Daughters Anticipated Caregiver: ex-husband with supportive daughter Caregiver Availability: 24/7  Social History Preferred language: English Religion:  Read: Yes Write: Yes Employment Status: Disabled Date Retired/Disabled/Unemployed: Real Estate/Cosmetic Sales   Abuse/Neglect Abuse/Neglect Assessment Can Be Completed: Yes Physical Abuse: Denies Verbal Abuse: Denies Sexual Abuse: Denies Exploitation  of patient/patient's resources: Denies Self-Neglect: Denies  Emotional Status Pt's affect, behavior and adjustment status: Normal mood, affect, impulsive and restless Recent Psychosocial Issues: Insomnia (Unisom) Psychiatric History: Depression (Lexapro) , eating disorder Substance Abuse History: ETOH (2-3 beers/day), Tobacco (2.5PPD), Marijuana (daily-for nausea)  Patient / Family Perceptions, Expectations & Goals Pt/Family understanding of illness & functional limitations: Patient has a fair understanding of current issue and functional limitations Premorbid pt/family roles/activities: Independent with ADLs prior to admission Anticipated changes in roles/activities/participation: May need supervision at discharge Pt/family expectations/goals: Return home with ex-husband who can provide 24/7 care  [employer] available at discharge: Ex-husband can provide transportation Resource referrals recommended: Neuropsychology  Discharge Planning Living Arrangements: Other (Comment)(ex-husband) Support Systems: Children Type of Residence: Private residence Insurance Resources: Chartered certified accountant Resources: SSD Money Management: Patient Does the patient have any problems obtaining your medications?: No Home Management: Ex-husband will manage the home Patient/Family Preliminary Plans: Home with ex-husband and local daughter will assist as needed after work [place] Barriers to Discharge: Home environment access/layout, Decreased caregiver support [place] Barriers to Discharge Comments: 2 level home with 3 step entry. bil rails, main B+B Social Work Anticipated Follow Up Needs: [place] Additional Notes/Comments: Ex-husband has obtained a BSC, and grab bars for the bath and bed rail for bed Expected length of stay: 7 days  Clinical Impression Pleasant, restless but attentive to conversation. Interrupted by nausea/vomiting (phlegm). Noted this was the first time with  vomiting since admission 05/12/19; had milk for breakfast and should not have drank it. Reports supportive ex-husband and daughter.[facility] nurse) Will discharge with her ex-husband [name] who can assist as needed. Reported previous CVA ~5 years ago caused similar effects and she had been through therapy post and recovered well. Anticipates doing same this time and notes  she "quit smoking and drinking" this time. Declined counseling information for drinking.    Dorien Chihuahua B 05/15/2019, 10:44 AM

## 2019-05-15 NOTE — Progress Notes (Signed)
Initial Nutrition Assessment  DOCUMENTATION CODES:   Severe malnutrition in context of social or environmental circumstances  INTERVENTION:  Increase Ensure Enlive po TID, each supplement provides 350 kcal and 20 grams of protein  MVI daily  NUTRITION DIAGNOSIS:   Severe Malnutrition related to social / environmental circumstances as evidenced by severe muscle depletion, severe fat depletion.   GOAL:   Patient will meet greater than or equal to 90% of their needs    MONITOR:   PO intake, Supplement acceptance, Labs, Weight trends  REASON FOR ASSESSMENT:   Malnutrition Screening Tool    ASSESSMENT:   Pt with a PMH significant for HTN, CHF, dyslipidemia, depression, and tobacco, marijuana and alcohol abuse presented to [facility] with CVA on 05/04/19. Pt admitted to CIR on 05/12/19.   Pt reports appetite had been improving, but endorses N/V today. Pt states she vomited one time today after drinking milk. PTA, pt ate mostly salads, fruit, vegetables, beans, eggs, and some fish. Pt states that she tried to eat 3 times per day, but did not always include protein. Pt requesting Ensure Enlive.   PO intake: 40-80% x 5 recorded meals (59% average meal intake)  Medications reviewed and include: Ensure Enlive BID, Folvite, MVI, Klor-con, Thiamine  Labs reviewed: K+ 3.4 (L)  NUTRITION - FOCUSED PHYSICAL EXAM:    Most Recent Value  Orbital Region  Mild depletion  Upper Arm Region  Severe depletion  Thoracic and Lumbar Region  Severe depletion  Buccal Region  Moderate depletion  Temple Region  Moderate depletion  Clavicle Bone Region  Severe depletion  Clavicle and Acromion Bone Region  Severe depletion  Scapular Bone Region  Severe depletion  Dorsal Hand  Moderate depletion  Patellar Region  Severe depletion  Anterior Thigh Region  Severe depletion  Posterior Calf Region  Severe depletion  Edema (RD Assessment)  None  Hair  Reviewed  Eyes  Reviewed  Mouth  Reviewed  Skin   Reviewed  Nails  Reviewed       Diet Order:   Diet Order            DIET DYS 3 Room service appropriate? Yes; Fluid consistency: Thin  Diet effective now              EDUCATION NEEDS:   No education needs have been identified at this time  Skin:  Skin Assessment: Reviewed RN Assessment  Last BM:  4/11  Height:   Ht Readings from Last 1 Encounters:  05/12/19 5\' 4"  (1.626 m)    Weight:   Wt Readings from Last 6 Encounters:  05/12/19 43.8 kg  10/06/18 40.4 kg  09/01/18 38.6 kg  08/25/18 38.3 kg  07/27/18 39.3 kg  06/16/18 40.8 kg    BMI:  Body mass index is 16.56 kg/m.  Estimated Nutritional Needs:   Kcal:  1400-1600  Protein:  70-85 grams  Fluid:  >1.4L/d   Larkin Ina, MS, RD, LDN RD pager number and weekend/on-call pager number located in [place].

## 2019-05-15 NOTE — IPOC Note (Signed)
Overall Plan of Care [facility]) Patient Details Name: Stacie [name] MRN: [id number] DOB: 10-09-1955  Admitting Diagnosis: Subcortical infarction [facility])  Hospital Problems: Principal Problem:   Subcortical infarction [facility])     Functional Problem List: Nursing    PT Balance, Behavior, Endurance, Motor, Pain, Perception, Safety, Skin Integrity  OT Balance, Perception, Safety, Motor  SLP Cognition, Safety  TR         Basic ADL's: OT Grooming, Bathing, Dressing, Toileting     Advanced  ADL's: OT Simple Meal Preparation, Light Housekeeping     Transfers: PT Bed Mobility, Bed to Chair, Car, Furniture, Floor  OT Toilet, Tub/Shower     Locomotion: PT Ambulation, Emergency planning/management officer, Stairs     Additional Impairments: OT None  SLP Swallowing, Communication, Social Cognition expression Problem Solving, Attention, Awareness  TR      Anticipated Outcomes Item Anticipated Outcome  Self Feeding No goal  Swallowing  Mod I   Basic self-care  Media planner Transfers Supervision  Bowel/Bladder     Transfers  Mod I with LRAD  Locomotion  ambulatory with LRAD and supervision assist  Communication  Mod I  Cognition  Supervision  Pain     Safety/Judgment      Therapy Plan: PT Intensity: Minimum of 1-2 x/day ,45 to 90 minutes PT Frequency: 5 out of 7 days PT Duration Estimated Length of Stay: 7-10 days OT Intensity: Minimum of 1-2 x/day, 45 to 90 minutes OT Frequency: 5 out of 7 days OT Duration/Estimated Length of Stay: 5-7 days SLP Intensity: Minumum of 1-2 x/day, 30 to 90 minutes SLP Frequency: 3 to 5 out of 7 days SLP Duration/Estimated Length of Stay: 5-7 days   Due to the current state of emergency, patients may not be receiving their 3-hours of Medicare-mandated therapy.   Team Interventions: Nursing Interventions    PT interventions Ambulation/gait training, Functional mobility training, Discharge planning, Psychosocial  support, Therapeutic Activities, Visual/perceptual remediation/compensation, Therapeutic Exercise, Wheelchair propulsion/positioning, Skin care/wound management, Neuromuscular re-education, Disease management/prevention, Training and development officer, DME/adaptive equipment instruction, Cognitive remediation/compensation, Pain management, Splinting/orthotics, UE/LE Strength taining/ROM, UE/LE Coordination activities, Patient/family education, Functional electrical stimulation, Community reintegration, IT trainer  OT Interventions Training and development officer, Discharge planning, Functional electrical stimulation, Pain management, Self Care/advanced ADL retraining, Therapeutic Activities, UE/LE Coordination activities, Visual/perceptual remediation/compensation, Therapeutic Exercise, Skin care/wound managment, Patient/family education, Functional mobility training, Disease mangement/prevention, Community reintegration, Engineer, drilling, Neuromuscular re-education, Psychosocial support, UE/LE Strength taining/ROM  SLP Interventions Cognitive remediation/compensation, Dysphagia/aspiration precaution training, Internal/external aids, Speech/Language facilitation, Therapeutic Activities, Environmental controls, Cueing hierarchy, Functional tasks, Patient/family education  TR Interventions    SW/CM Interventions Discharge Planning, Patient/Family Education, Psychosocial Support, Disease Management/Prevention   Barriers to Discharge MD  Medical stability  Nursing      PT Inaccessible home environment, Decreased caregiver support, Home environment access/layout, [place] environment access/layout, Decreased caregiver support 2 level home with 3 step entry. bil rails, main B+B   Team Discharge Planning: Destination: PT-Home ,OT- Home , SLP-Home Projected Follow-up: PT-Home health PT, OT-  Outpatient OT, Home health OT, SLP-Outpatient  SLP(intermittent vs 24 hour supervision at home) Projected Equipment Needs: PT-To be determined, OT- To be determined, SLP-None recommended by SLP Equipment Details: PT- , OT-  Patient/family involved in discharge planning: PT- Patient,  OT-Patient, SLP-Patient  MD ELOS: 5-7 days Medical Rehab Prognosis:  Excellent Assessment: The patient has been admitted for CIR  therapies with the diagnosis of right subcortical infarct. The team will be addressing functional mobility, strength, stamina, balance, safety, adaptive techniques and equipment, self-care, bowel and bladder mgt, patient and caregiver education, NMR, visual-spatial awareness, cognition, commuication. Goals have been set at supervision to mod I.   Due to the current state of emergency, patients may not be receiving their 3 hours per day of Medicare-mandated therapy.    Meredith Staggers, MD, FAAPMR      See Team Conference Notes for weekly updates to the plan of care

## 2019-05-15 NOTE — Care Management (Signed)
[facility] Individual Statement of Services  Patient Name:  Stacie [name]  Date:  05/15/2019  Welcome to the [facility].  Our goal is to provide you with an individualized program based on your diagnosis and situation, designed to meet your specific needs.  With this comprehensive rehabilitation program, you will be expected to participate in at least 3 hours of rehabilitation therapies Monday-Friday, with modified therapy programming on the weekends.  Your rehabilitation program will include the following services:  Physical Therapy (PT), Occupational Therapy (OT), Speech Therapy (ST), 24 hour per day rehabilitation nursing, Therapeutic Recreation (TR), Neuropsychology, Case Management (Social Worker), Rehabilitation Medicine, Nutrition Services and Pharmacy Services  Weekly team conferences will be held on Wednesdays  to discuss your progress.  Your Social Industrial/product designer will talk with you frequently to get your input and to update you on team discussions.  Team conferences with you and your family in attendance may also be held.  Expected length of stay: 7 days   Overall anticipated outcome: Supervision level  Depending on your progress and recovery, your program may change. Your Social Industrial/product designer will coordinate services and will keep you informed of any changes. Your Social Worker's/Care Manager's name and contact numbers are listed  below.  The following services may also be recommended but are not provided by the [facility]:    [place] will be made to provide these services after discharge if needed.  Arrangements include referral to agencies that provide these services.  Your insurance has been verified to be:  [employer]-Medicare Your primary doctor is:  Wilfred Lacy, NP  Pertinent information will be shared with your doctor and  your insurance company.  Care Manager/Social Worker: Dorien Chihuahua, RN  [phone] or ([phone]  Information discussed with and copy given to patient by: Margarito Liner, 05/15/2019, 10:32 AM

## 2019-05-15 NOTE — Progress Notes (Signed)
Occupational Therapy Session Note  Patient Details  Name: Stacie [name] MRN: [id number] Date of Birth: [birth year]  Today's Date: 05/15/2019 OT Individual Time: [id number] OT Individual Time Calculation (min): 72 min    Short Term Goals: Week 1:  OT Short Term Goal 1 (Week 1): STGs=LTGs due to ELOS  Skilled Therapeutic Interventions/Progress Updates:    Upon entering the room, pt supine in bed and under bed covers. Pt reports not feeling well today and reports nausea and vomiting. OT taking pt's tempt with reading of 97.6. Pt requesting to use bathroom and stands from bed with supervision and ambulates into bathroom with CGA without use of AD. Pt able to perform hygiene and clothing management with CGA as well. Pt returning to sit on EOB and requesting to remain in room this session. OT provided pt with paper handout and medium soft theraputty for L UE strengthening and coordination exercises. OT demonstrating exercises and providing min cuing for proper technique. Pt performed exercises 1-23 on sheet with rest breaks as needed secondary to hand fatigue. Pt's daughter entering the room at end of session and pt remained in bed with bed alarm activated and call bell within reach.   Therapy Documentation Precautions:  Precautions Precautions: Fall Precaution Comments: Lt hemi Restrictions Weight Bearing Restrictions: No General:   Vital Signs: Therapy Vitals Temp: 97.7 F (36.5 C) Pulse Rate: 84 Resp: 16 BP: (!) 166/78 Patient Position (if appropriate): Lying Oxygen Therapy SpO2: 99 % O2 Device: Room Air Pain:   ADL: ADL Eating: Not assessed Grooming: Contact guard Where Assessed-Grooming: Standing at sink Upper Body Bathing: Setup Where Assessed-Upper Body Bathing: Edge of bed Lower Body Bathing: Contact guard Where Assessed-Lower Body Bathing: Edge of bed Upper Body Dressing: Setup Where Assessed-Upper Body Dressing: Edge of bed Lower Body Dressing: Contact guard Where  Assessed-Lower Body Dressing: Edge of bed Toileting: Supervision/safety Where Assessed-Toileting: Glass blower/designer: Therapist, music Method: Ambulating(RW) Social research officer, government: Environmental education officer Method: Ambulating(without AD) Youth worker: Other (comment)(3:1)   Therapy/Group: Individual Therapy  Gypsy Decant 05/15/2019, 4:32 PM

## 2019-05-15 NOTE — Progress Notes (Addendum)
[facility] PHYSICAL MEDICINE & REHABILITATION PROGRESS NOTE   Subjective/Complaints: Up with SLP. No new complaints. Feels quite well. Denies any pain. Had a good weekend with therapy  ROS: Patient denies fever, rash, sore throat, blurred vision, nausea, vomiting, diarrhea, cough, shortness of breath or chest pain, joint or back pain, headache, or mood change.    Objective:   No results found. Recent Labs    05/12/19 1652 05/15/19 0544  WBC 8.1 6.6  HGB 14.0 12.2  HCT 43.7 38.8  PLT 361 315   Recent Labs    05/12/19 1652 05/15/19 0544  NA  --  140  K  --  3.4*  CL  --  105  CO2  --  25  GLUCOSE  --  102*  BUN  --  13  CREATININE 0.80 0.75  CALCIUM  --  9.0    Intake/Output Summary (Last 24 hours) at 05/15/2019 1312 Last data filed at 05/15/2019 0800 Gross per 24 hour  Intake 800 ml  Output --  Net 800 ml     Physical Exam: Vital Signs Blood pressure (!) 156/74, pulse 76, temperature 98 F (36.7 C), temperature source Oral, resp. rate 18, height 5\' 4"  (1.626 m), weight 43.8 kg, SpO2 97 %. Constitutional: No distress . Vital signs reviewed. HEENT: EOMI, oral membranes moist Neck: supple Cardiovascular: RRR without murmur. No JVD    Respiratory/Chest: CTA Bilaterally without wheezes or rales. Normal effort    GI/Abdomen: BS +, non-tender, non-distended Ext: no clubbing, cyanosis, or edema Psych: pleasant and cooperative  Musculoskeletal:  General: No tenderness,deformityor edema.  Cervical back: Normal range of motion.  Neurological: She isalertand oriented to person, place, and time. Left central 7 and 12. Speech remains clear. Normal language. Reasonable insight and awareness. Mild left inattention. RUE and RLE 5/5. LUE 4/5 prox to distal. LLE 4/5. Normal LT and PP in all 4 limbs. DTR's 1+ Skin: Skin iswarmand dry. She isnot diaphoretic. Foam dressings over forearms Psychiatric: She has a normal mood and affect. Herbehavior is  normal.Judgmentand thought contentnormal.   Assessment/Plan: 1. 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  Physiatrist is providing close team supervision and 24 hour management of active medical problems listed below.  Physiatrist and rehab team continue to assess barriers to discharge/monitor patient progress toward functional and medical goals  Care Tool:  Bathing    Body parts bathed by patient: Right arm, Left arm, Chest, Abdomen, Front perineal area, Buttocks, Right upper leg, Left upper leg, Right lower leg, Left lower leg, Face         Bathing assist Assist Level: Contact Guard/Touching assist     Upper Body Dressing/Undressing Upper body dressing   What is the patient wearing?: Bra, Pull over shirt    Upper body assist Assist Level: Set up assist    Lower Body Dressing/Undressing Lower body dressing      What is the patient wearing?: Underwear/pull up, Pants     Lower body assist Assist for lower body dressing: Contact Guard/Touching assist     Toileting Toileting    Toileting assist Assist for toileting: Independent     Transfers Chair/bed transfer  Transfers assist     Chair/bed transfer assist level: Contact Guard/Touching assist     Locomotion Ambulation   Ambulation assist      Assist level: Independent Assistive device: No Device Max distance: 150'   Walk 10 feet activity  Assist     Assist level: Supervision/Verbal cueing Assistive device: No Device   Walk 50 feet activity   Assist    Assist level: Contact Guard/Touching assist      Walk 150 feet activity   Assist    Assist level: Minimal Assistance - Patient > 75%      Walk 10 feet on uneven surface  activity   Assist     Assist level: Minimal Assistance - Patient > 75%     Wheelchair     Assist Will patient use wheelchair at  discharge?: No Type of Wheelchair: Manual    Wheelchair assist level: Minimal Assistance - Patient > 75% Max wheelchair distance: 75    Wheelchair 50 feet with 2 turns activity    Assist        Assist Level: Minimal Assistance - Patient > 75%   Wheelchair 150 feet activity     Assist      Assist Level: Moderate Assistance - Patient 50 - 74%   Blood pressure (!) 156/74, pulse 76, temperature 98 F (36.7 C), temperature source Oral, resp. rate 18, height 5\' 4"  (1.626 m), weight 43.8 kg, SpO2 97 %.    Medical Problem List and Plan: 1.Left-sided weakness with dysarthriasecondary to multiple small subcortical infarcts right punctate, left caudate secondary to small vessel disease -patient may shower -ELOS/Goals: 7-10 days, supervision to mod I  -Continue CIR PT, OT 2. Antithrombotics: -DVT/anticoagulation:ambulating 150'+, lovenox stopped -antiplatelet therapy: Aspirin 81 mg daily and Plavix 75 mg daily x3 weeks then aspirin alone 3. Pain Management:Tylenol as needed, denies pain.  4. Mood:Continue Lexapro 20 mg daily Unisom for sleep. In very positive mood  -antipsychotic agents: N/A -in good spirits at present, team to provide ego support as needed. 5. Neuropsych: This patientiscapable of making decisions on herown behalf. 6. Skin/Wound Care:Routine skin checks 7. Fluids/Electrolytes/Nutrition:encourage po  -I personally reviewed the patient's labs today.    -mild hypokalemia 3.4, begin kdur 72meq daily 8. Hypertension. Norvasc 10 mg daily, hydralazine 50mg  every 8 hours.  -SBP elevated at present, follow for pattern and adjust regimen as needed  -BP elevated to 184/95 on 4/10: increased Hydralazine to 75mg  q8H.   -BP continues to be elevated to 176/82; will increase Hydralazine to 100mg  q8H   4/12 BP borderline but better today. Avoid over-treating and observe with changes  made over last 2 days.  9. History of alcohol/tobacco/marijuana use. Urine drug screen positive marijuana. Continue NicoDerm patch. Provide counseling 10. Hyperlipidemia. Zocor 11. UTI ESBL. Contact precautions ongoing 12. Dysphagia: D3/thins, advance as tolerated per SLP  LOS: 3 days A FACE TO FACE EVALUATION WAS PERFORMED  Meredith Staggers 05/15/2019, 1:12 PM

## 2019-05-15 NOTE — Progress Notes (Signed)
PRN trazodone 50mg 's given at 2117, slept good. Bilateral arms with foam dressings. Ambulating with CGA, without AD. Denies pain. LBM 04/11.Stacie [name] A

## 2019-05-15 NOTE — Progress Notes (Signed)
Physical Therapy Session Note  Patient Details  Name: Aadya Kindler MRN: [id number] Date of Birth: 04/23/1955  Today's Date: 05/15/2019 PT Individual Time: 1005-1100 PT Individual Time Calculation (min): 55 min   Short Term Goals: Week 1:  PT Short Term Goal 1 (Week 1): STG=LTG due to ELOS  Skilled Therapeutic Interventions/Progress Updates: Pt presented at EOB with Neoma Laming, LSW present agreeable to therapy. Pt with bouts of nausea and recent episode of emesis. Nsg aware and received nausea meds during session. Pt ambulated throughout unit without AD and CGA. Pt participated in balance/coordination activities at mat including toe taps 2 x 10 to 6 in step. Stepping forward/backwards diagonals in 4 square pattern for coordination of LLE. Pt initially requiring minA for backwards stepping when leading with LLE however improved to CGA with repetition. Participated in standing LE therex at parallel bars: hip abd/add, hamstring pulls, heel rises, and mini squats 2 x 10 bilaterally with cues for technique and pacing/controlled movement. Pt ambulated back to room CGA no AD back to room and returned to bed. Pt left sitting in bed with alarm on, call bell within reach and needs met.      Therapy Documentation Precautions:  Precautions Precautions: Fall Precaution Comments: Lt hemi Restrictions Weight Bearing Restrictions: No General:   Vital Signs: Therapy Vitals Temp: 97.7 F (36.5 C) Pulse Rate: 84 Resp: 16 BP: (!) 166/78 Patient Position (if appropriate): Lying Oxygen Therapy SpO2: 99 % O2 Device: Room Air   Therapy/Group: Individual Therapy  Aluna Whiston  Cassidie Veiga, PTA  05/15/2019, 4:19 PM

## 2019-05-15 NOTE — Progress Notes (Signed)
Inpatient Rehabilitation  Patient information reviewed and entered into eRehab system by Halina Asano M. Ashtyn Freilich, M.A., CCC/SLP, PPS Coordinator.  Information including medical coding, functional ability and quality indicators will be reviewed and updated through discharge.    

## 2019-05-16 ENCOUNTER — Inpatient Hospital Stay (HOSPITAL_COMMUNITY): Payer: Medicare Other | Admitting: Physical Therapy

## 2019-05-16 ENCOUNTER — Inpatient Hospital Stay (HOSPITAL_COMMUNITY): Payer: Medicare Other | Admitting: Occupational Therapy

## 2019-05-16 ENCOUNTER — Inpatient Hospital Stay (HOSPITAL_COMMUNITY): Payer: Medicare Other | Admitting: Speech Pathology

## 2019-05-16 DIAGNOSIS — E43 Unspecified severe protein-calorie malnutrition: Secondary | ICD-10-CM

## 2019-05-16 HISTORY — DX: Unspecified severe protein-calorie malnutrition: E43

## 2019-05-16 NOTE — Progress Notes (Signed)
[facility] PHYSICAL MEDICINE & REHABILITATION PROGRESS NOTE   Subjective/Complaints: Denies complaints. BP continues to be elevated this morning. Felt a little nauseous this morning but better now. Does not want any nausea medication  ROS: Patient denies fever, rash, sore throat, blurred vision, nausea, vomiting, diarrhea, cough, shortness of breath or chest pain, joint or back pain, headache, or mood change.    Objective:   No results found. Recent Labs    05/15/19 0544  WBC 6.6  HGB 12.2  HCT 38.8  PLT 315   Recent Labs    05/15/19 0544  NA 140  K 3.4*  CL 105  CO2 25  GLUCOSE 102*  BUN 13  CREATININE 0.75  CALCIUM 9.0    Intake/Output Summary (Last 24 hours) at 05/16/2019 1258 Last data filed at 05/16/2019 0900 Gross per 24 hour  Intake 240 ml  Output --  Net 240 ml     Physical Exam: Vital Signs Blood pressure (!) 144/80, pulse 74, temperature 98.4 F (36.9 C), temperature source Oral, resp. rate 16, height 5\' 4"  (1.626 m), weight 43.8 kg, SpO2 98 %. Constitutional: No distress . Vital signs reviewed. Standing in room with OT HEENT: EOMI, oral membranes moist Neck: supple Cardiovascular: RRR without murmur. No JVD    Respiratory/Chest: CTA Bilaterally without wheezes or rales. Normal effort    GI/Abdomen: BS +, non-tender, non-distended Ext: no clubbing, cyanosis, or edema Psych: pleasant and cooperative  Musculoskeletal:  General: No tenderness,deformityor edema.  Cervical back: Normal range of motion.  Neurological: She isalertand oriented to person, place, and time. Left central 7 and 12. Speech remains clear. Normal language. Reasonable insight and awareness. Mild left inattention. RUE and RLE 5/5. LUE 4/5 prox to distal. LLE 4/5. Normal LT and PP in all 4 limbs. DTR's 1+ Skin: Skin iswarmand dry. She isnot diaphoretic. Foam dressings over forearms Psychiatric: She has a normal mood and affect. Herbehavior is  normal.Judgmentand thought contentnormal.   Assessment/Plan: 1. 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  Physiatrist is providing close team supervision and 24 hour management of active medical problems listed below.  Physiatrist and rehab team continue to assess barriers to discharge/monitor patient progress toward functional and medical goals  Care Tool:  Bathing    Body parts bathed by patient: Right arm, Left arm, Chest, Abdomen, Front perineal area, Buttocks, Right upper leg, Left upper leg, Right lower leg, Left lower leg, Face         Bathing assist Assist Level: Supervision/Verbal cueing     Upper Body Dressing/Undressing Upper body dressing   What is the patient wearing?: Pull over shirt    Upper body assist Assist Level: Supervision/Verbal cueing    Lower Body Dressing/Undressing Lower body dressing      What is the patient wearing?: Underwear/pull up, Pants     Lower body assist Assist for lower body dressing: Contact Guard/Touching assist     Toileting Toileting    Toileting assist Assist for toileting: Supervision/Verbal cueing     Transfers Chair/bed transfer  Transfers assist     Chair/bed transfer assist level: Supervision/Verbal cueing     Locomotion Ambulation   Ambulation assist      Assist level: Supervision/Verbal cueing Assistive device: No Device Max distance: 162ft   Walk 10 feet activity   Assist     Assist level: Supervision/Verbal cueing Assistive device: No Device   Walk 50 feet activity  Assist    Assist level: Supervision/Verbal cueing Assistive device: No Device    Walk 150 feet activity   Assist    Assist level: Supervision/Verbal cueing Assistive device: No Device    Walk 10 feet on uneven surface  activity   Assist     Assist level: Minimal Assistance - Patient >  75%     Wheelchair     Assist Will patient use wheelchair at discharge?: No Type of Wheelchair: Manual    Wheelchair assist level: Minimal Assistance - Patient > 75% Max wheelchair distance: 75    Wheelchair 50 feet with 2 turns activity    Assist        Assist Level: Minimal Assistance - Patient > 75%   Wheelchair 150 feet activity     Assist      Assist Level: Moderate Assistance - Patient 50 - 74%   Blood pressure (!) 144/80, pulse 74, temperature 98.4 F (36.9 C), temperature source Oral, resp. rate 16, height 5\' 4"  (1.626 m), weight 43.8 kg, SpO2 98 %.    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 Antithrombotics: -DVT/anticoagulation:ambulating 150'+, lovenox stopped -antiplatelet therapy: Aspirin 81 mg daily and Plavix 75 mg daily x3 weeks then aspirin alone 3. Pain Management:Tylenol as needed, denies pain.  4. Mood:Continue Lexapro 20 mg daily Unisom for sleep. In very positive mood  -antipsychotic agents: N/A -in good spirits at present, team to provide ego support as needed. 5. Neuropsych: This patientiscapable of making decisions on herown behalf. 6. Skin/Wound Care:Routine skin checks. Skin tears on forearm observed and healing well 7. Fluids/Electrolytes/Nutrition:encourage po  -mild hypokalemia 3.4, continue kdur 29meq daily 8. Hypertension. Norvasc 10 mg daily, hydralazine 50mg  every 8 hours.  -SBP elevated at present, follow for pattern and adjust regimen as needed  -BP elevated to 184/95 on 4/10: increased Hydralazine to 75mg  q8H.   -BP continues to be elevated to 176/82; will increase Hydralazine to 100mg  q8H   4/12 BP  borderline but better today. Avoid over-treating and observe with changes made over last 2 days.   4/13: BP much better controlled. Continue to monitor.  9. History of alcohol/tobacco/marijuana use. Urine drug screen positive marijuana. Continue NicoDerm patch. Provide counseling 10. Hyperlipidemia. Zocor 11. UTI ESBL. Contact precautions ongoing 12. Dysphagia: D3/thins, advance as tolerated per SLP  LOS: 4 days A FACE TO FACE EVALUATION WAS PERFORMED  Clide Deutscher Farah Benish 05/16/2019, 12:58 PM

## 2019-05-16 NOTE — Progress Notes (Addendum)
Speech Language Pathology Daily Session Note  Patient Details  Name: Ichelle Schoof MRN: [id number] Date of Birth: March 12, 1955  Today's Date: 05/16/2019 SLP Individual Time: 1416-1500 SLP Individual Time Calculation (min): 44 min  Short Term Goals: Week 1: SLP Short Term Goal 1 (Week 1): STGs=LTGS due to ELOS  Skilled Therapeutic Interventions: Pt was seen for skilled ST targeting cognitive goals. SLP facilitated session with Min A verbal and visual cues for organization and complex problem solving throughout 2 deductive reasoning/scheduling tasks. Pt also required verbal reminders to reduce impulsivity and implement strategies to increase accuracy. Pt stated awareness of need to "slow down" for safety and accuracy and her need for SLPs cueing to execute that throughout tasks today. She required 1 verbal cue for visual scanning left to right during reading task. Pt also recalled current medications with overall Min A verbal cues. Pt requested to void during session, and demonstrated good safety awareness during functional ambulation to and from restroom without need for interventions from SLP for safety awareness. Also of note, pt was 100% intelligible in conversation and Mod I for use of compensatory strategies for intelligibility.  Pt left laying in bed with alarm set and needs within reach. Continue per current plan of care.        Pain Pain Assessment Pain Scale: 0-10 Pain Score: 0-No pain  Therapy/Group: Individual Therapy  Arbutus Leas 05/16/2019, 7:30 AM

## 2019-05-16 NOTE — Progress Notes (Signed)
Occupational Therapy Session Note  Patient Details  Name: Stacie [name] MRN: [id number] Date of Birth: 11/06/1955  Today's Date: 05/16/2019 OT Individual Time: 0805-0900 and [id number] OT Individual Time Calculation (min): 55 min and 42 mins   Short Term Goals: Week 1:  OT Short Term Goal 1 (Week 1): STGs=LTGs due to ELOS  Skilled Therapeutic Interventions/Progress Updates:    session 1: Upon entering the room, pt supine in bed and sleeping soundly but agreeable to OT intervention. Pt requesting to wash this session at sink. Pt ambulating without use of AD to obtain clothing and all needed items with close supervision. Pt standing at sink with min cuing for sit <>stand for safety awareness. Pt performing UB self care with supervision and LB dressing with one LOB posteriorly requiring CGA. Pt washing feet as well and donning shoes with figure four position. Pt cleaning up area and placing items in bag with supervision as well. Pt toileting twice this session with close supervision for transfer and clothing management each time. Pt checking plants and obtaining water in cup to carry to window to water flowers without spilling water or dropping cup from L hand. Pt returning to bed at end of session with call bell and all needed items within reach. Bed alarm activated.   Session 2: Upon entering the room, pt supine in bed and reports, " I'm freezing". Pt requesting to use bathroom and ambulates with close supervision to bathroom for toileting needs. Pt calling caregiver, [name], per therapist requests and OT discussed family education with him agreeing to come this Thursday from 1-3 for hands on training. Pt ambulating 700' + with supervision and without use of AD outside. Pt ambulating on uneven surfaces and variety of terrain with close supervision and no LOB. Pt taking seated rest break and then returning back to room in same manner as above. Pt returning to sit on EOB with call bell and all needed  items within reach . Bed alarm activated.   Therapy Documentation Precautions:  Precautions Precautions: Fall Precaution Comments: Lt hemi Restrictions Weight Bearing Restrictions: No   ADL: ADL Eating: Not assessed Grooming: Contact guard Where Assessed-Grooming: Standing at sink Upper Body Bathing: Setup Where Assessed-Upper Body Bathing: Edge of bed Lower Body Bathing: Contact guard Where Assessed-Lower Body Bathing: Edge of bed Upper Body Dressing: Setup Where Assessed-Upper Body Dressing: Edge of bed Lower Body Dressing: Contact guard Where Assessed-Lower Body Dressing: Edge of bed Toileting: Supervision/safety Where Assessed-Toileting: Glass blower/designer: Therapist, music Method: Ambulating(RW) Social research officer, government: Environmental education officer Method: Ambulating(without AD) Youth worker: Other (comment)(3:1) Vision   Perception    Praxis   Exercises:   Other Treatments:     Therapy/Group: Individual Therapy  Gypsy Decant 05/16/2019, 10:09 AM

## 2019-05-16 NOTE — Progress Notes (Signed)
Physical Therapy Session Note  Patient Details  Name: Stacie [name] MRN: [id number] Date of Birth: 01/06/1956  Today's Date: 05/16/2019 PT Individual Time: 1015-1100 PT Individual Time Calculation (min): 45 min   Short Term Goals: Week 1:  PT Short Term Goal 1 (Week 1): STG=LTG due to ELOS  Skilled Therapeutic Interventions/Progress Updates: Pt presented sitting in bed agreeable to therapy. Pt denies pain but requesting to use bathroom. Performed ambulatory transfer to bathroom close S and performed toilet transfers and supervision. Pt ambulated to rehab gym close S without AD with some improved fluidity in gait. Pt participated in floor transfer at overall CGA with pt noted to have decreased recovery when stepping forward. Pt also assisted in cleaning mat when placed onto high/low mat demonstrating good safety with reaching across mat. Pt then ambulated to ADL apt and participated in vacuuming floor. Pt also demonstrated good safety with power cord moving it when appropriate as well as stepping over cord and gathering when needed. Pt did note to have increased dyspnea after activity and discussed energy conservation as pt stated does most of cleaning when home. Pt also states enjoys doing gardening when home, ambulated back to rehab gym and sat on bench to simulate sitting on gardening vs sitting on floor. Pt verbalized understanding. Pt ambulated back to room at end of session and ambulated to bathroom prior to returning to bed all performed at supervision level. Pt left with bed alarm on, call bell within reach and needs met.      Therapy Documentation Precautions:  Precautions Precautions: Fall Precaution Comments: Lt hemi Restrictions Weight Bearing Restrictions: No   Therapy/Group: Individual Therapy  Karston Hyland  Missey Hasley, PTA  05/16/2019, 12:55 PM

## 2019-05-17 ENCOUNTER — Inpatient Hospital Stay (HOSPITAL_COMMUNITY): Payer: Medicare Other | Admitting: Occupational Therapy

## 2019-05-17 ENCOUNTER — Inpatient Hospital Stay (HOSPITAL_COMMUNITY): Payer: Medicare Other | Admitting: Physical Therapy

## 2019-05-17 ENCOUNTER — Inpatient Hospital Stay (HOSPITAL_COMMUNITY): Payer: Medicare Other | Admitting: Speech Pathology

## 2019-05-17 MED ORDER — ISOSORBIDE MONONITRATE ER 30 MG PO TB24
15.0000 mg | ORAL_TABLET | Freq: Every day | ORAL | Status: DC
  Administered 2019-05-17 – 2019-05-18 (×2): 15 mg via ORAL
  Filled 2019-05-17 (×2): qty 1

## 2019-05-17 NOTE — Patient Care Conference (Signed)
Inpatient RehabilitationTeam Conference and Plan of Care Update Date: 05/17/2019   Time: 10:10 AM    Patient Name: Stacie [name]      Medical Record Number: [id number]  Date of Birth: [birth year] Sex: Female         Room/Bed: 4W16C/4W16C-02 Payor Info: Payor: [employer] MEDICARE / Plan: [facility] MEDICARE / Product Type: *No Product type* /    Admit Date/Time:  05/12/2019  4:23 PM  Primary Diagnosis:  Subcortical infarction [facility])  Patient Active Problem List   Diagnosis Date Noted  . Protein-calorie malnutrition, severe 05/16/2019  . 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[place]) 05/12/2019  . Underweight 05/05/2019  . Depression 05/05/2019  . Tobacco abuse 05/05/2019  . Marijuana use 05/05/2019  . Alcohol abuse 05/05/2019  . Chronic diastolic CHF (congestive heart failure) ([place]) 05/05/2019  . CVA (cerebral vascular accident) ([place]) 05/04/2019  . Chronic diarrhea 08/26/2018  . Loss of weight 08/26/2018  . PAD (peripheral artery disease) ([place]) 12/06/2017  . Claudication of right lower extremity ([place]) 12/06/2017  . Hyperlipidemia 02/28/2016  . Essential hypertension 02/21/2016  . 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 Hearing loss 02/21/2016    Expected Discharge Date: Expected Discharge Date: 05/19/19  Team Members Present: Physician leading conference: Dr. Leeroy Cha Care Coodinator Present: Karene Fry, RN, MSN;Christina Sampson Goon, BSW;Deborah Tobin Chad, RN, BSN, CRRN Nurse Present: Isla Pence, RN PT Present: Phylliss Bob, PTA;Barrie Folk, PT OT Present: Darleen Crocker, OT SLP Present: Jettie Booze, CF-SLP PPS Coordinator present : Ileana Ladd, Burna Mortimer, SLP     Current Status/Progress Goal Weekly Team Focus  Bowel/Bladder   Pt is continent of B/B. LBM 05/14/19  Pt will remain continent of b/b  Q2h toileting/ PRN   Swallow/Nutrition/ Hydration   Dysphagia 3, thin liquids, Supervision A for use of strategies for oral clearance  Mod I   education, carryover and independence with swallow straegies   ADL's   CGA overall without use of AD  supervision overall with set up A for LB dressing and bathing  L NMR, strengthening, balance, functional transfers/mobility, self care training   Mobility   supervision bed mobility, CGA transfers, gait, dynamic balance, CGA stairs  supervision  higher level balance, endurance, floor transfers, d/c planning   Communication   Mod I 100% intelligible  Mod I  finish education, carryover strategies in conversation   Safety/Cognition/ Behavioral Observations  ~Min A reduce impulsivity and complex problem solving  Supervision  anticipatory awareness, complex problem solving, reducing impulsivity, education   Pain   Pt has no pain at this time  Pt will remain free of pain  Assess pain Qshift/PRN   Skin   No obvious signs of skin breakdown or infection. Bruising to BUE  Pt will remain free of infection or breakdown  Assess skin qshift/PRN    Rehab Goals Patient on target to meet rehab goals: Yes *See Care Plan and progress notes for long and short-term goals.     Barriers to Discharge  Current Status/Progress Possible Resolutions Date Resolved   Nursing                  PT                    OT                  SLP                SW Home environment access/layout;Decreased caregiver support;Medication compliance;Other (comments)(Patient  declined ETOH and smoking counseling) 2 level home with 3 step entry. bil rails, main B+B            Discharge Planning/Teaching Needs:  Home with ex-husband and daughter to assist after work  Medications, transfers, toileting, B+D assistance, etc   Team Discussion: MD BP high, meds added.  RN A+O, BM 4/11.  PT at goal level, working on higher level balance, S gait, S transfers, ex husband for fam ed tomorrow.  OT close S ADLs, transfers CGA with posterior LOB yesterday.  SLP D3thins, mild dysarthria, S complex prob solving, mod I goals.  Home with ex  husband at DC.   Revisions to Treatment Plan: N/A     Medical Summary Current Status: BP better controlled, but still elevated. Dysphagia 3 liquid diet. Mild dysarthria but 100% intelligible. S assist for problem solving Weekly Focus/Goal: Better management of BP, dysphagia, constipation  Barriers to Discharge: Medical stability   Possible Resolutions to Barriers: Better management of BP, dysphagia, constipation   Continued Need for Acute Rehabilitation Level of Care: The patient requires daily medical management by a physician with specialized training in physical medicine and rehabilitation for the following reasons: Direction of a multidisciplinary physical rehabilitation program to maximize functional independence : Yes Medical management of patient stability for increased activity during participation in an intensive rehabilitation regime.: Yes Analysis of laboratory values and/or radiology reports with any subsequent need for medication adjustment and/or medical intervention. : Yes   I attest that I was present, lead the team conference, and concur with the assessment and plan of the team.   Retta Diones 05/17/2019, 4:23 PM   Team conference was held via web/ teleconference due to [place] - 19

## 2019-05-17 NOTE — Progress Notes (Signed)
Occupational Therapy Session Note  Patient Details  Name: Stacie [name] MRN: [id number] Date of Birth: November 18, 1955  Today's Date: 05/17/2019 OT Individual Time: 1120-1200 and 1520 and 1601 OT Individual Time Calculation (min): 40 min and 41 mins   Short Term Goals: Week 1:  OT Short Term Goal 1 (Week 1): STGs=LTGs due to ELOS  Skilled Therapeutic Interventions/Progress Updates:    Session 1: Upon entering the room, pt seated on EOB awaiting OT arrival. Pt's caregiver, [name], present in room as well but leaving soon after. OT briefly discussed 24/7 supervision for home and discussed what he will be doing during family education. He verbalized understanding, confirmed time, and left the room. Pt ambulating to bathroom with supervision overall for transfer, hygiene, and clothing management. Pt ambulating 150' with supervision to ADL apartment. Pt ambulating within kitchen, opening cabinets, getting items down from top shelves, obtaining items from refrigerator with supervision overall. Pt ambulating 500' and needing increased time for navigation but locating room again. Pt's BP at start of session was 173/84 and medication given. At end of session after ambulation results of 168/79 and pt remains asymptomatic. Pt returning to sit on EOB with bed alarm activated and call bell within reach.    Session 2: Upon entering the room, pt seated on EOB awaiting OT arrival. Pt with no c/o pain. Pt ambulating to gym with supervision overall without use of AD. Pt performed dynavision task with L UE only with average reaction time of 1.6 seconds which is an improvement from eval of 1.74 seconds. Pt ambulating back to day room with supervision as well. Pt's grip strength measured with an average of 36 lbs today from 33 lbs at evaluation for L UE. R UE results today were 53 lbs with average on evaluation being 46 lbs. Pt making improvements in B hand strength. 9 hole peg test with results being 20.7 seconds for R hand  and 27.7 seconds for L hand. On initial evaluation results were 44 seconds for L UE and 26 seconds for R UE. Pt making significant changes in these areas and tearful with progress. Pt ambulating back to room in same manner as above. Pt's daughter present in the room who provided supervision for pt during toileting. OT checking her off to provide supervision while she is in the room.   Therapy Documentation Precautions:  Precautions Precautions: Fall Precaution Comments: Lt hemi Restrictions Weight Bearing Restrictions: No    ADL: ADL Eating: Not assessed Grooming: Contact guard Where Assessed-Grooming: Standing at sink Upper Body Bathing: Setup Where Assessed-Upper Body Bathing: Edge of bed Lower Body Bathing: Contact guard Where Assessed-Lower Body Bathing: Edge of bed Upper Body Dressing: Setup Where Assessed-Upper Body Dressing: Edge of bed Lower Body Dressing: Contact guard Where Assessed-Lower Body Dressing: Edge of bed Toileting: Supervision/safety Where Assessed-Toileting: Glass blower/designer: Therapist, music Method: Ambulating(RW) Social research officer, government: Environmental education officer Method: Ambulating(without AD) Youth worker: Other (comment)(3:1)   Therapy/Group: Individual Therapy  Gypsy Decant 05/17/2019, 12:54 PM

## 2019-05-17 NOTE — Progress Notes (Signed)
Speech Language Pathology Daily Session Note  Patient Details  Name: Stacie [name] MRN: [id number] Date of Birth: [birth year]  Today's Date: 05/17/2019 SLP Individual Time: 1357-1456 SLP Individual Time Calculation (min): 59 min  Short Term Goals: Week 1: SLP Short Term Goal 1 (Week 1): STGs=LTGS due to ELOS  Skilled Therapeutic Interventions: Pt was seen for skilled ST targeting dysphagia and cognitive goals. SLP provided skilled observation of pt consuming current diet (dysphagia 3/thin liquid) snack, during which pt was Mod I for use of swallow strategies for oral clearance and general swallow precautions. No overt s/sx aspiration observed throughout intake of solids or liquids. Recommend pt continue current diet. Although increased Min-Mod A verbal and visual cues were required for organization during a novel deductive reasoning puzzle, pt only required Supervision A verbal cues for complex problem solving during functional and more familiar tasks such as planning a meal, navigating the unit, and making copies. Pt did not require any cues for attention to left visual field throughout session today. During functional ambulation around unit, pt also only required Supervision A verbal cues for safety awareness. Pt located her room and left laying in bed with alarm set and needs within reach. Continue per current plan of care.        Pain Pain Assessment Pain Scale: 0-10 Pain Score: 0-No pain  Therapy/Group: Individual Therapy  Stacie [name] 05/17/2019, 7:00 AM

## 2019-05-17 NOTE — Progress Notes (Signed)
[facility] PHYSICAL MEDICINE & REHABILITATION PROGRESS NOTE   Subjective/Complaints: Denies complaints. BP continues to be elevated this morning. Denies nausea. Has family training tomorrow and prefers to go home soon.    ROS: Patient denies fever, rash, sore throat, blurred vision, nausea, vomiting, diarrhea, cough, shortness of breath or chest pain, joint or back pain, headache, or mood change.    Objective:   No results found. Recent Labs    05/15/19 0544  WBC 6.6  HGB 12.2  HCT 38.8  PLT 315   Recent Labs    05/15/19 0544  NA 140  K 3.4*  CL 105  CO2 25  GLUCOSE 102*  BUN 13  CREATININE 0.75  CALCIUM 9.0    Intake/Output Summary (Last 24 hours) at 05/17/2019 1001 Last data filed at 05/16/2019 1900 Gross per 24 hour  Intake 280 ml  Output --  Net 280 ml     Physical Exam: Vital Signs Blood pressure (!) 152/82, pulse 79, temperature 98.1 F (36.7 C), resp. rate 18, height 5\' 4"  (1.626 m), weight 43.8 kg, SpO2 97 %. Constitutional: No distress . Vital signs reviewed. Sitting in room comfortably drinking coffee HEENT: EOMI, oral membranes moist Neck: supple Cardiovascular: RRR without murmur. No JVD    Respiratory/Chest: CTA Bilaterally without wheezes or rales. Normal effort    GI/Abdomen: BS +, non-tender, non-distended Ext: no clubbing, cyanosis, or edema Psych: pleasant and cooperative  Musculoskeletal:  General: No tenderness,deformityor edema.  Cervical back: Normal range of motion.  Neurological: She isalertand oriented to person, place, and time. Left central 7 and 12. Speech remains clear. Normal language. Reasonable insight and awareness. Mild left inattention. RUE and RLE 5/5. LUE 4/5 prox to distal. LLE 4/5. Normal LT and PP in all 4 limbs. DTR's 1+ Skin: Skin iswarmand dry. She isnot diaphoretic. Foam dressings over forearms Psychiatric: She has a normal mood and affect. Herbehavior is normal.Judgmentand thought  contentnormal.   Assessment/Plan: 1. 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  Physiatrist is providing close team supervision and 24 hour management of active medical problems listed below.  Physiatrist and rehab team continue to assess barriers to discharge/monitor patient progress toward functional and medical goals  Care Tool:  Bathing    Body parts bathed by patient: Right arm, Left arm, Chest, Abdomen, Front perineal area, Buttocks, Right upper leg, Left upper leg, Right lower leg, Left lower leg, Face         Bathing assist Assist Level: Supervision/Verbal cueing     Upper Body Dressing/Undressing Upper body dressing   What is the patient wearing?: Pull over shirt    Upper body assist Assist Level: Supervision/Verbal cueing    Lower Body Dressing/Undressing Lower body dressing      What is the patient wearing?: Underwear/pull up, Pants     Lower body assist Assist for lower body dressing: Contact Guard/Touching assist     Toileting Toileting    Toileting assist Assist for toileting: Supervision/Verbal cueing     Transfers Chair/bed transfer  Transfers assist     Chair/bed transfer assist level: Supervision/Verbal cueing     Locomotion Ambulation   Ambulation assist      Assist level: Supervision/Verbal cueing Assistive device: No Device Max distance: 700'   Walk 10 feet activity   Assist     Assist level: Supervision/Verbal cueing Assistive device: No Device   Walk 50 feet activity   Assist  Assist level: Supervision/Verbal cueing Assistive device: No Device    Walk 150 feet activity   Assist    Assist level: Supervision/Verbal cueing Assistive device: No Device    Walk 10 feet on uneven surface  activity   Assist     Assist level: Supervision/Verbal cueing Assistive device: Other  (comment)(no device)   Wheelchair     Assist Will patient use wheelchair at discharge?: No Type of Wheelchair: Manual    Wheelchair assist level: Minimal Assistance - Patient > 75% Max wheelchair distance: 75    Wheelchair 50 feet with 2 turns activity    Assist        Assist Level: Minimal Assistance - Patient > 75%   Wheelchair 150 feet activity     Assist      Assist Level: Moderate Assistance - Patient 50 - 74%   Blood pressure (!) 152/82, pulse 79, temperature 98.1 F (36.7 C), resp. rate 18, height 5\' 4"  (1.626 m), weight 43.8 kg, SpO2 97 %.    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 Antithrombotics: -DVT/anticoagulation:ambulating 150'+, lovenox stopped -antiplatelet therapy: Aspirin 81 mg daily and Plavix 75 mg daily x3 weeks then aspirin alone 3. Pain Management:Tylenol as needed, denies pain.  4. Mood:Continue Lexapro 20 mg daily Unisom for sleep. In very positive mood  -antipsychotic agents: N/A -in good spirits at present, team to provide ego support as needed. 5. Neuropsych: This patientiscapable of making decisions on herown behalf. 6. Skin/Wound Care:Routine skin checks. Skin tears on forearm observed and healing well 7. Fluids/Electrolytes/Nutrition:encourage po  -mild hypokalemia 3.4, continue kdur 22meq daily 8. Hypertension. Norvasc 10 mg daily, hydralazine 50mg  every 8 hours.  -SBP elevated at present, follow for pattern and adjust regimen as needed  -BP elevated to 184/95 on 4/10: increased Hydralazine to 75mg  q8H.   -BP continues to be elevated to 176/82; will increase Hydralazine to 100mg  q8H   4/12 BP borderline but  better today. Avoid over-treating and observe with changes made over last 2 days.   4/13: BP much better controlled. Continue to monitor.   4/14: Slightly elevated to 150s today. Add imdur 15mg  daily.  9. History of alcohol/tobacco/marijuana use. Urine drug screen positive marijuana. Continue NicoDerm patch. Provide counseling 10. Hyperlipidemia. Zocor 11. UTI ESBL. Contact precautions ongoing 12. Dysphagia: D3/thins, advance as tolerated per SLP 13. Constipation: Last BM 4/11; will add prune juice with her meals.   LOS: 5 days A FACE TO FACE EVALUATION WAS PERFORMED  Alane Hanssen P Lumir Demetriou 05/17/2019, 10:01 AM

## 2019-05-17 NOTE — Progress Notes (Signed)
Physical Therapy Session Note  Patient Details  Name: Stacie [name] MRN: [id number] Date of Birth: [birth year]  Today's Date: 05/17/2019 PT Individual Time: 0915-1000 PT Individual Time Calculation (min): 45 min   Short Term Goals: Week 1:  PT Short Term Goal 1 (Week 1): STG=LTG due to ELOS  Skilled Therapeutic Interventions/Progress Updates:  Pt presented at EOB with nsg present providing meds agreeable to therapy. Pt denies pain during session. Pt ambulated to rehab gym no AD supervision and session focused on higher level balance including step outs with maxi Sky for safety. Pt noted to be able to step out but have mild posterior LOB when returning LE L>R. Pt also performed toe taps to 6in step single LE then alternating with emphasis on stepping forward vs more laterally. Pt performed cross stepping (g[place]) 25f x 2 ea direction and backwards walking. Pt ambulated back to room at end of session with supervision. Pt left sitting EOB with bed alarm on, call bell within reach and needs met.      Therapy Documentation Precautions:  Precautions Precautions: Fall Precaution Comments: Lt hemi Restrictions Weight Bearing Restrictions: No General:   Vital Signs: Therapy Vitals Temp: 98.1 F (36.7 C) Pulse Rate: 85 Resp: 17 BP: 131/64 Patient Position (if appropriate): Sitting Oxygen Therapy SpO2: 98 % O2 Device: Room Air Pain: Pain Assessment Pain Scale: 0-10 Pain Score: 0-No pain    Therapy/Group: Individual Therapy  Zainah Steven  Lakelynn Severtson, PTA  05/17/2019, 4:30 PM

## 2019-05-18 ENCOUNTER — Encounter (HOSPITAL_COMMUNITY): Payer: Medicare Other | Admitting: Occupational Therapy

## 2019-05-18 ENCOUNTER — Other Ambulatory Visit: Payer: Self-pay | Admitting: Physical Medicine and Rehabilitation

## 2019-05-18 ENCOUNTER — Inpatient Hospital Stay (HOSPITAL_COMMUNITY): Payer: Medicare Other | Admitting: Occupational Therapy

## 2019-05-18 ENCOUNTER — Encounter (HOSPITAL_COMMUNITY): Payer: Medicare Other | Admitting: Speech Pathology

## 2019-05-18 ENCOUNTER — Inpatient Hospital Stay (HOSPITAL_COMMUNITY): Payer: Medicare Other

## 2019-05-18 ENCOUNTER — Ambulatory Visit (HOSPITAL_COMMUNITY): Payer: Medicare Other | Admitting: Physical Therapy

## 2019-05-18 MED ORDER — HYDRALAZINE HCL 100 MG PO TABS: 100.0000 mg | ORAL_TABLET | Freq: Three times a day (TID) | ORAL | 0 refills | Status: DC

## 2019-05-18 MED ORDER — SIMVASTATIN 20 MG PO TABS: 20.0000 mg | ORAL_TABLET | Freq: Every day | ORAL | 1 refills | Status: DC

## 2019-05-18 MED ORDER — ISOSORBIDE MONONITRATE ER 30 MG PO TB24: 30.0000 mg | ORAL_TABLET | Freq: Every day | ORAL | 0 refills | Status: DC

## 2019-05-18 MED ORDER — ISOSORBIDE MONONITRATE ER 30 MG PO TB24: 15.0000 mg | ORAL_TABLET | Freq: Every day | ORAL | 0 refills | Status: DC

## 2019-05-18 MED ORDER — FOLIC ACID 1 MG PO TABS: 1.0000 mg | ORAL_TABLET | Freq: Every day | ORAL | 0 refills | Status: DC

## 2019-05-18 MED ORDER — ACETAMINOPHEN 325 MG PO TABS: 650.0000 mg | ORAL_TABLET | ORAL | Status: DC | PRN

## 2019-05-18 MED ORDER — ISOSORBIDE MONONITRATE ER 30 MG PO TB24
30.0000 mg | ORAL_TABLET | Freq: Every day | ORAL | Status: DC
  Administered 2019-05-19: 30 mg via ORAL
  Filled 2019-05-18: qty 1

## 2019-05-18 MED ORDER — CLOPIDOGREL BISULFATE 75 MG PO TABS: 75.0000 mg | ORAL_TABLET | Freq: Every day | ORAL | 0 refills | Status: DC

## 2019-05-18 MED ORDER — NICOTINE 14 MG/24HR TD PT24: MEDICATED_PATCH | TRANSDERMAL | 0 refills | Status: DC

## 2019-05-18 MED ORDER — ESCITALOPRAM OXALATE 20 MG PO TABS: ORAL_TABLET | ORAL | 3 refills | Status: DC

## 2019-05-18 MED ORDER — AMLODIPINE BESYLATE 10 MG PO TABS: 10.0000 mg | ORAL_TABLET | Freq: Every day | ORAL | 1 refills | Status: DC

## 2019-05-18 NOTE — Progress Notes (Signed)
[facility] PHYSICAL MEDICINE & REHABILITATION PROGRESS NOTE   Subjective/Complaints: Denies complaints. BP increased from yesterday.  Family training today.   ROS: Patient denies fever, rash, sore throat, blurred vision, nausea, vomiting, diarrhea, cough, shortness of breath or chest pain, joint or back pain, headache, or mood change.    Objective:   No results found. No results for input(s): WBC, HGB, HCT, PLT in the last 72 hours. No results for input(s): NA, K, CL, CO2, GLUCOSE, BUN, CREATININE, CALCIUM in the last 72 hours.  Intake/Output Summary (Last 24 hours) at 05/18/2019 1400 Last data filed at 05/18/2019 0900 Gross per 24 hour  Intake 240 ml  Output --  Net 240 ml     Physical Exam: Vital Signs Blood pressure (!) 168/74, pulse 70, temperature 98.3 F (36.8 C), resp. rate 18, height 5\' 4"  (1.626 m), weight 43.8 kg, SpO2 96 %. Constitutional: No distress . Vital signs reviewed.  HEENT: EOMI, oral membranes moist Neck: supple Cardiovascular: RRR without murmur. No JVD    Respiratory/Chest: CTA Bilaterally without wheezes or rales. Normal effort    GI/Abdomen: BS +, non-tender, non-distended Ext: no clubbing, cyanosis, or edema Psych: pleasant and cooperative  Musculoskeletal:  General: No tenderness,deformityor edema.  Cervical back: Normal range of motion.  Functional mobility: Toilet transfer with supervision Neurological: She isalertand oriented to person, place, and time. Left central 7 and 12. Speech remains clear. Normal language. Reasonable insight and awareness. Mild left inattention. RUE and RLE 5/5. LUE 4/5 prox to distal. LLE 4/5. Normal LT and PP in all 4 limbs. DTR's 1+ Skin: Skin iswarmand dry. She isnot diaphoretic. Foam dressings over forearms Psychiatric: She has a normal mood and affect. Herbehavior is normal.Judgmentand thought contentnormal.   Assessment/Plan: 1. 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  Physiatrist is providing close team supervision and 24 hour management of active medical problems listed below.  Physiatrist and rehab team continue to assess barriers to discharge/monitor patient progress toward functional and medical goals  Care Tool:  Bathing    Body parts bathed by patient: Right arm, Left arm, Chest, Abdomen, Front perineal area, Buttocks, Right upper leg, Left upper leg, Right lower leg, Left lower leg, Face         Bathing assist Assist Level: Supervision/Verbal cueing     Upper Body Dressing/Undressing Upper body dressing   What is the patient wearing?: Pull over shirt    Upper body assist Assist Level: Supervision/Verbal cueing    Lower Body Dressing/Undressing Lower body dressing      What is the patient wearing?: Underwear/pull up, Pants     Lower body assist Assist for lower body dressing: Contact Guard/Touching assist     Toileting Toileting    Toileting assist Assist for toileting: Supervision/Verbal cueing     Transfers Chair/bed transfer  Transfers assist     Chair/bed transfer assist level: Independent with assistive device     Locomotion Ambulation   Ambulation assist      Assist level: Supervision/Verbal cueing Assistive device: No Device Max distance: 150'   Walk 10 feet activity   Assist     Assist level: Supervision/Verbal cueing Assistive device: No Device   Walk 50 feet activity   Assist    Assist level: Supervision/Verbal cueing Assistive device: No Device    Walk 150 feet activity   Assist    Assist level: Supervision/Verbal cueing Assistive device: No Device  Walk 10 feet on uneven surface  activity   Assist     Assist level: Supervision/Verbal cueing Assistive device: Other (comment)(no device)   Wheelchair     Assist Will patient use wheelchair at discharge?:  No Type of Wheelchair: Manual    Wheelchair assist level: Minimal Assistance - Patient > 75% Max wheelchair distance: 75    Wheelchair 50 feet with 2 turns activity    Assist        Assist Level: Minimal Assistance - Patient > 75%   Wheelchair 150 feet activity     Assist      Assist Level: Moderate Assistance - Patient 50 - 74%   Blood pressure (!) 168/74, pulse 70, temperature 98.3 F (36.8 C), resp. rate 18, height 5\' 4"  (1.626 m), weight 43.8 kg, SpO2 96 %.    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 Antithrombotics: -DVT/anticoagulation:ambulating 150'+, lovenox stopped -antiplatelet therapy: Aspirin 81 mg daily and Plavix 75 mg daily x3 weeks then aspirin alone 3. Pain Management:Tylenol as needed, denies pain.  4. Mood:Continue Lexapro 20 mg daily Unisom for sleep. In very positive mood  -antipsychotic agents: N/A -in good spirits at present, team to provide ego support as needed. 5. Neuropsych: This patientiscapable of making decisions on herown behalf. 6. Skin/Wound Care:Routine skin checks. Skin tears on forearm observed and healing well 7. Fluids/Electrolytes/Nutrition:encourage po  -mild hypokalemia 3.4, continue kdur 85meq daily 8. Hypertension. Norvasc 10 mg daily, hydralazine 100q8H  4/15: Increase Imdur to 30mg  daily 9. History of alcohol/tobacco/marijuana use. Urine drug screen positive marijuana. Continue NicoDerm patch. Provide counseling 10. Hyperlipidemia. Zocor 11. UTI ESBL. Contact precautions ongoing 12. Dysphagia: D3/thins, advance as tolerated per SLP 13. Constipation: Last BM 4/11; will add prune juice with her  meals.  14. Disposition: Family training today. DC tomorrow.   LOS: 6 days A FACE TO FACE EVALUATION WAS PERFORMED  Sharonica Kraszewski P Leighanne Adolph 05/18/2019, 2:00 PM

## 2019-05-18 NOTE — Progress Notes (Signed)
Occupational Therapy Session Note  Patient Details  Name: Stacie [name] MRN: [id number] Date of Birth: Apr 05, 1955  Today's Date: 05/18/2019 OT Individual Time: 6811-5726 OT Individual Time Calculation (min): 28 min    Short Term Goals: Week 1:  OT Short Term Goal 1 (Week 1): STGs=LTGs due to ELOS  Skilled Therapeutic Interventions/Progress Updates:  Patient met seated EOb in agreement with OT treatment session Pain 0/10 at rest and with activity. Skilled OT services provided with focus on self-care re-education and therapeutic activity as detailed below. Patient able to return demonstrate LUE HEP with I and use of medium firm thera-putty. Patient indicated need to void requiring supervision A for ambulation to bathroom without AD. Patient completed toilet transfer, toileting/hyginee/clothing management with supervision A. Patient left seated EOB with bed alarm activated and call bell within reach.   Therapy Documentation Precautions:  Precautions Precautions: Fall Precaution Comments: Lt hemi Restrictions Weight Bearing Restrictions: No  Therapy/Group: Individual Therapy  Stacie [name] 05/18/2019, 7:58 AM

## 2019-05-18 NOTE — Progress Notes (Signed)
Physical Therapy Discharge Summary  Patient Details  Name: Stacie [name] MRN: [id number] Date of Birth: 1955/10/24  Today's Date: 05/18/2019 PT Individual Time: 1520-1600 PT Individual Time Calculation (min): 40 min    Patient has met 8 of 8 long term goals due to improved activity tolerance, improved balance, improved postural control, increased strength, ability to compensate for deficits, functional use of  left upper extremity and left lower extremity, improved attention, improved awareness and improved coordination.  Patient to discharge at an ambulatory level Independent.   Patient's care partner is independent to provide the necessary physical and cognitive assistance at discharge.  Reasons goals not met: All PT goals met   Recommendation:  Patient will benefit from ongoing skilled PT services in outpatient setting to continue to advance safe functional mobility, address ongoing impairments in balance, strength, coordination, safety, and minimize fall risk.  Equipment: No equipment provided  Reasons for discharge: treatment goals met and discharge from hospital  Patient/family agrees with progress made and goals achieved: Yes   PT Treatment:  Pt received sitting EOB and agreeable to PT. Pt's ex husband present for family education. PT instructed pt in Grad day assessment to measure progress toward goals. See below for details. Gait training without AD throughout rehab unit 2 x 288f, no cues or assist from PT. Stair negotiation training x 12 steps with supervision assist for safety. Car transfer performed with SO present with cues for use of hand supports as needed. Dynamic gait training to navigate through food court and across cement/bricked [facility] No cues or assist need from PT for safety, noted to maintain wide BOS throughout. [facility] and all needs met.       PT  Discharge Precautions/Restrictions Precautions Precautions: Fall Restrictions Weight Bearing Restrictions: No Vital Signs Therapy Vitals BP: (!) 149/82 Pain Pain Assessment Pain Scale: 0-10 Pain Score: 0-No pain Vision/Perception  Vision - Assessment Eye Alignment: Within Functional Limits Perception Perception: Impaired Inattention/Neglect: Does not attend to left side of body  Cognition Arousal/Alertness: Awake/alert Orientation Level: Oriented X4 Memory: Appears intact Awareness: Appears intact Problem Solving: Impaired Reasoning: Impaired Sensation Sensation Light Touch: Appears Intact Proprioception: Appears Intact Coordination Gross Motor Movements are Fluid and Coordinated: No Fine Motor Movements are Fluid and Coordinated: No Coordination and Movement Description: improved smoothness and accuracy  Motor  Motor Motor: Hemiplegia;Ataxia Motor - Discharge Observations: L hemiplegia, improved since eval  Mobility Bed Mobility Rolling Right: Independent with assistive device Rolling Left: Independent with assistive device Transfers Sit to Stand: Independent with assistive device Stand Pivot Transfers: Independent with assistive device Locomotion  Gait Ambulation: Yes Gait Assistance: Independent Gait Distance (Feet): 300 Feet Assistive device: None Gait Gait: Yes Gait Pattern: Impaired Gait Pattern: Wide base of support Stairs / Additional Locomotion Stairs: Yes Stairs Assistance: Supervision/Verbal cueing Stair Management Technique: Two rails Number of Stairs: 12 Height of Stairs: 6 Wheelchair Mobility Wheelchair Mobility: No  Trunk/Postural Assessment  Cervical Assessment Cervical Assessment: Within Functional Limits Thoracic Assessment Thoracic Assessment: Within Functional Limits Lumbar Assessment Lumbar Assessment: Within Functional Limits Postural Control Postural Control: Deficits on evaluation(mild trunkal ataxia)  Balance Static  Sitting Balance Static Sitting - Level of Assistance: 7: Independent Dynamic Sitting Balance Dynamic Sitting - Level of Assistance: 7: Independent Static Standing Balance Static Standing - Level of Assistance: 6: Modified independent (Device/Increase time) Dynamic Standing Balance Dynamic Standing - Level of Assistance: 6: Modified independent (Device/Increase time) Extremity Assessment  RUE Assessment RUE Assessment: Within Functional Limits LUE Assessment LUE Assessment: Within Functional Limits General Strength Comments: strength WFL, limited by L hand coordination and awareness deficits  RLE Assessment RLE Assessment: Within Functional Limits General Strength Comments: grossly 4+/5 to 5/5 proximal to distal LLE Assessment LLE Assessment: Within Functional Limits General Strength Comments: grossly 4+/5  to 5/5 proximal to distal    Lorie Phenix 05/18/2019, 4:14 PM

## 2019-05-18 NOTE — Progress Notes (Signed)
Team Conference Report to Patient/Family  Team Conference discussion was reviewed with the patient's  and daughter, including goals, any changes in plan of care and target discharge date.  Patient's daughter expressed understanding and is  in agreement.  The patient has a target discharge date of 05/19/19. Family education set up for 05/18/19 with family.  Dorien Chihuahua B 05/18/2019, 7:43 AM

## 2019-05-18 NOTE — Progress Notes (Signed)
Physical Therapy Session Note  Patient Details  Name: Stacie [name] MRN: [id number] Date of Birth: [birth year]  Today's Date: 05/18/2019 PT Individual Time: [id number] PT Individual Time Calculation (min): 45 min   Short Term Goals: Week 1:  PT Short Term Goal 1 (Week 1): STG=LTG due to ELOS  Skilled Therapeutic Interventions/Progress Updates:    Pt performs basic transfer from bed mod I and functional dynamic standing balance at sink to perform oral hygiene. Pt requires overall supervision during gait training and functional mobility on unit (> 150') for safety due to decreased safety awareness and impulsivity/speed. Educated on this throughout session in functional scenarios and pt verbalized understanding but still requires reminders for safety. When too quick with movements, pt requires assist with balance recovery or external support. Fall recovery training including floor transfers and what to do in case of fall/emergency with pt able to verbalize steps and performs transfers with supervision. NMR for balance re-training and coordination during floor ladder stepping/higher level sequencing pattern, bird dog in quadruped alternating UE/LE (pt requires up to min assist for balance when going too fast but when slow and controlled abel to perform with supervision) x 5-8 reps each. Stair negotiation training for community mobility using rails and NMR without rails to challenge balance. With rails pt performs at supervision level but without rails requires supervision up to min assist due to LOB due to moving too quickly. Reviewed this with patient who verbalized understanding. Berg Balance and FTSS administered for fall risk assessment (see below) and results discussed with patient. Pt performed toileting mod I upon return to room  Therapy Documentation Precautions:  Precautions Precautions: Fall Precaution Comments: Lt hemi Restrictions Weight Bearing Restrictions: No  Pain: Pain  Assessment Pain Scale: 0-10 Pain Score: 0-No pain    Balance: Berg Balance Test Sit to Stand: Able to stand without using hands and stabilize independently Standing Unsupported: Able to stand safely 2 minutes Sitting with Back Unsupported but Feet Supported on Floor or Stool: Able to sit safely and securely 2 minutes Stand to Sit: Sits safely with minimal use of hands Transfers: Able to transfer safely, minor use of hands Standing Unsupported with Eyes Closed: Able to stand 10 seconds safely Standing Ubsupported with Feet Together: Needs help to attain position but able to stand for 30 seconds with feet together From Standing, Reach Forward with Outstretched Arm: Can reach confidently >25 cm (10") From Standing Position, Pick up Object from Floor: Able to pick up shoe safely and easily From Standing Position, Turn to Look Behind Over each Shoulder: Looks behind from both sides and weight shifts well Turn 360 Degrees: Able to turn 360 degrees safely one side only in 4 seconds or less Standing Unsupported, Alternately Place Feet on Step/Stool: Able to complete >2 steps/needs minimal assist Standing Unsupported, One Foot in Front: Able to plae foot ahead of the other independently and hold 30 seconds Standing on One Leg: Able to lift leg independently and hold equal to or more than 3 seconds Total Score: 46   Five times Sit to Stand Test (FTSS) Method: Use a straight back chair with a solid seat that is 16-18" high. Ask participant to sit on the chair with arms folded across their chest.   Instructions: "Stand up and sit down as quickly as possible 5 times, keeping your arms folded across your chest."   Measurement: Stop timing when the participant stands the 5th time.  TIME: __12__ (in seconds)  Times > 13.6 seconds is associated with  increased disability and morbidity (Guralnik, 2000) Times > 15 seconds is predictive of recurrent falls in healthy individuals aged 45 and older  (Buatois, et al., 2008) Normal performance values in community dwelling individuals aged 56 and older (Bohannon, 2006): o 60-69 years: 11.4 seconds o 70-79 years: 12.6 seconds o 80-89 years: 14.8 seconds  MCID: ? 2.3 seconds for Vestibular Disorders (Meretta, 2006)    Therapy/Group: Individual Therapy  Canary Brim Ivory Broad, PT, DPT, CBIS  05/18/2019, 10:55 AM

## 2019-05-18 NOTE — Progress Notes (Signed)
Occupational Therapy Discharge Summary  Patient Details  Name: Stacie [name] MRN: [id number] Date of Birth: August 12, 1955  Today's Date: 05/18/2019 OT Individual Time: 1331-1415 OT Individual Time Calculation (min): 44 min    OT treatment session focused on pt/family education. Pt's ex-husband [name] present for family ed. Educated on providing supervision level of assist for bathroom transfers which pt and [name] demonstrated. Pt voided bladder and completed all aspects of toileting without assist. Pt ambulated down to therapy apartment and practiced tub bench transfers with supervision. Education provided for safe participation of BADL tasks at home, environmental modifications, and participation in [place] tasks. OT provided pt with home fine motor program and reviewed activities with pt and [name]. OT then provided handout for UB HEP with green theraband Went through each exercise with pt with focus on L UE strengthening. Pt returned to room at end of session and was able to pathfind to room without cues. Pt urinated one more time in bathroom and returned to EOB with supervision.   Patient has met 9 of 9 long term goals due to improved activity tolerance, improved balance, postural control, ability to compensate for deficits and functional use of  LEFT upper and LEFT lower extremity.  Patient to discharge at overall Supervision level.  Patient's care partner is independent to provide the necessary physical assistance at discharge.    Reasons goals not met: n/a  Recommendation:  Patient will benefit from ongoing skilled OT services in home health setting to continue to advance functional skills in the area of BADL.  Equipment: tub transfer bench  Reasons for discharge: treatment goals met and discharge from hospital  Patient/family agrees with progress made and goals achieved: Yes  OT Discharge Precautions/Restrictions  Precautions Precautions: Fall Restrictions Weight Bearing Restrictions:  No Pain Pain Assessment Pain Scale: 0-10 Pain Score: 0-No pain ADL ADL Eating: Independent Grooming: Independent Where Assessed-Grooming: Standing at sink Upper Body Bathing: Independent Where Assessed-Upper Body Bathing: Edge of bed Lower Body Bathing: Supervision/safety Where Assessed-Lower Body Bathing: Edge of bed Upper Body Dressing: Independent Where Assessed-Upper Body Dressing: Edge of bed Lower Body Dressing: Supervision/safety Where Assessed-Lower Body Dressing: Edge of bed Toileting: Independent Where Assessed-Toileting: Risk analyst Method: Ambulating(RW) Education administrator: Environmental education officer Method: Ambulating(without AD) Youth worker: Other (comment)(3:1) Vision Eye Alignment: Within Designer, television/film set  Perception: Impaired Inattention/Neglect: Does not attend to left side of body Cognition Arousal/Alertness: Awake/alert Orientation Level: Oriented X4 Memory: Appears intact Awareness: Appears intact Problem Solving: Impaired Reasoning: Impaired Sensation Sensation Light Touch: Appears Intact Proprioception: Appears Intact Coordination Gross Motor Movements are Fluid and Coordinated: No Fine Motor Movements are Fluid and Coordinated: No Coordination and Movement Description: improved smoothness and accuracy  Motor  Motor Motor: Hemiplegia;Ataxia Motor - Discharge Observations: L hemiplegia, improved since eval Mobility  Bed Mobility Rolling Right: Independent with assistive device Rolling Left: Independent with assistive device Transfers Sit to Stand: Independent with assistive device  Balance Static Sitting Balance Static Sitting - Level of Assistance: 7: Independent Dynamic Sitting Balance Dynamic Sitting - Level of Assistance: 7: Independent Static Standing Balance Static Standing - Level of  Assistance: 6: Modified independent (Device/Increase time) Dynamic Standing Balance Dynamic Standing - Level of Assistance: 6: Modified independent (Device/Increase time) Extremity/Trunk Assessment RUE Assessment RUE Assessment: Within Functional Limits LUE Assessment LUE Assessment: Within Functional Limits General Strength Comments: strength WFL, limited by L hand coordination and awareness deficits    Daneen Schick Aric Jost  05/18/2019, 3:42 PM

## 2019-05-18 NOTE — Discharge Summary (Signed)
Physician Discharge Summary  Patient ID: Stacie [name] MRN: [id number] DOB/AGE: 1955/02/16 64 y.o.  Admit date: 05/12/2019 Discharge date: 05/19/2019  Discharge Diagnoses:  Principal Problem:   Subcortical infarction [facility]) Active Problems:   Protein-calorie malnutrition, severe DVT prophylaxis Hypertension History of alcohol tobacco and marijuana use Hyperlipidemia UTI/ESBL Constipation  Discharged Condition: Stable  Significant Diagnostic Studies: CT ANGIO HEAD W OR WO CONTRAST  Result Date: 05/05/2019 CLINICAL DATA:  Stroke follow-up EXAM: CT ANGIOGRAPHY HEAD AND NECK TECHNIQUE: Multidetector CT imaging of the head and neck was performed using the standard protocol during bolus administration of intravenous contrast. Multiplanar CT image reconstructions and MIPs were obtained to evaluate the vascular anatomy. Carotid stenosis measurements (when applicable) are obtained utilizing NASCET criteria, using the distal internal carotid diameter as the denominator. CONTRAST:  145mL OMNIPAQUE IOHEXOL 350 MG/ML SOLN COMPARISON:  Brain MRI from yesterday FINDINGS: CTA NECK FINDINGS Aortic arch: Atherosclerotic plaque. No acute finding or dilatation. Three vessel branching. Right carotid system: Mixed density plaque at the common carotid bifurcation with up to 40% stenosis. No ulceration. There is ICA tortuosity with mild kinking. Left carotid system: Mixed density plaque at the bifurcation with bulb stenosis measuring up to 30%. No beading or ulceration. Vertebral arteries: Proximal subclavian atheromatous plaque on the left. The left vertebral artery is strongly dominant. Both vertebral arteries are widely patent to the dura. Skeleton: Prominent cervical facet spurring which is generalized and prominent. No acute or aggressive finding. Other neck: Negative Upper chest: Biapical pleural based scarring. Review of the MIP images confirms the above findings CTA HEAD FINDINGS Anterior circulation: Scattered  atherosclerotic plaque on the carotid siphons without focal or flow reducing stenosis. No major branch occlusion. No aneurysm or vascular malformation. Posterior circulation: Strong left vertebral artery dominance. Most of right vertebral flow is to the PICA. Atheromatous irregularity of the bilateral posterior cerebral arteries which is mild to moderate. No branch occlusion or aneurysm. Venous sinuses: Unremarkable in the arterial phase. Anatomic variants: None significant Review of the MIP images confirms the above findings IMPRESSION: 1. No emergent finding. 2. Cervical and intracranial atherosclerosis without flow reducing stenosis or ulceration of major vessels. Electronically Signed   By: Monte Fantasia M.D.   On: 05/05/2019 04:34   DG Abd 1 View  Result Date: 05/07/2019 CLINICAL DATA:  Abdominal distension EXAM: ABDOMEN - 1 VIEW COMPARISON:  CT abdomen dated 08/18/2018. FINDINGS: No dilated large or small bowel loops are appreciated. No evidence of renal or ureteral calculi. No evidence of free air under either diaphragm. No acute or suspicious osseous finding. IMPRESSION: Nonobstructive bowel gas pattern. Electronically Signed   By: Franki Cabot M.D.   On: 05/07/2019 12:48   CT Head Wo Contrast  Result Date: 05/04/2019 CLINICAL DATA:  Neuro deficit, acute, stroke suspected. Additional history provided: Patient transported via EMS from home for slurred speech and left-sided facial droop, symptoms developed Tuesday night (unknown time). History of stroke. EXAM: CT HEAD WITHOUT CONTRAST TECHNIQUE: Contiguous axial images were obtained from the base of the skull through the vertex without intravenous contrast. COMPARISON:  No pertinent prior studies available for comparison. FINDINGS: Brain: Mildly motion degraded examination. Age-indeterminate infarct within the right corona radiata/basal ganglia. There is an additional age-indeterminate infarct more anteriorly within the right basal ganglia also  involve the adjacent deep right frontal white matter. There is no evidence of acute intracranial hemorrhage. No midline shift or extra-axial fluid collection. Background mild ill-defined hypoattenuation within the cerebral white matter is nonspecific, but consistent with  chronic small vessel ischemic disease. Mild generalized parenchymal atrophy. Vascular: No hyperdense vessel is identified. Atherosclerotic calcifications. Skull: Normal. Negative for fracture or focal lesion. Sinuses/Orbits: Visualized orbits demonstrate no acute abnormality. No significant paranasal sinus disease or mastoid effusion at the imaged levels. IMPRESSION: Mildly motion degraded examination. Age-indeterminate infarcts within the right frontal lobe white matter and right basal ganglia as described, possibly acute or subacute. Brain MRI is suggested for further evaluation. Background mild generalized parenchymal atrophy and chronic small vessel ischemic disease. Electronically Signed   By: Kellie Simmering DO   On: 05/04/2019 17:24   CT ANGIO NECK W OR WO CONTRAST  Result Date: 05/05/2019 CLINICAL DATA:  Stroke follow-up EXAM: CT ANGIOGRAPHY HEAD AND NECK TECHNIQUE: Multidetector CT imaging of the head and neck was performed using the standard protocol during bolus administration of intravenous contrast. Multiplanar CT image reconstructions and MIPs were obtained to evaluate the vascular anatomy. Carotid stenosis measurements (when applicable) are obtained utilizing NASCET criteria, using the distal internal carotid diameter as the denominator. CONTRAST:  127mL OMNIPAQUE IOHEXOL 350 MG/ML SOLN COMPARISON:  Brain MRI from yesterday FINDINGS: CTA NECK FINDINGS Aortic arch: Atherosclerotic plaque. No acute finding or dilatation. Three vessel branching. Right carotid system: Mixed density plaque at the common carotid bifurcation with up to 40% stenosis. No ulceration. There is ICA tortuosity with mild kinking. Left carotid system: Mixed density  plaque at the bifurcation with bulb stenosis measuring up to 30%. No beading or ulceration. Vertebral arteries: Proximal subclavian atheromatous plaque on the left. The left vertebral artery is strongly dominant. Both vertebral arteries are widely patent to the dura. Skeleton: Prominent cervical facet spurring which is generalized and prominent. No acute or aggressive finding. Other neck: Negative Upper chest: Biapical pleural based scarring. Review of the MIP images confirms the above findings CTA HEAD FINDINGS Anterior circulation: Scattered atherosclerotic plaque on the carotid siphons without focal or flow reducing stenosis. No major branch occlusion. No aneurysm or vascular malformation. Posterior circulation: Strong left vertebral artery dominance. Most of right vertebral flow is to the PICA. Atheromatous irregularity of the bilateral posterior cerebral arteries which is mild to moderate. No branch occlusion or aneurysm. Venous sinuses: Unremarkable in the arterial phase. Anatomic variants: None significant Review of the MIP images confirms the above findings IMPRESSION: 1. No emergent finding. 2. Cervical and intracranial atherosclerosis without flow reducing stenosis or ulceration of major vessels. Electronically Signed   By: Monte Fantasia M.D.   On: 05/05/2019 04:34   MR BRAIN WO CONTRAST  Result Date: 05/04/2019 CLINICAL DATA:  Left facial droop and dysarthria noted over the last 3 days. EXAM: MRI HEAD WITHOUT CONTRAST TECHNIQUE: Multiplanar, multiecho pulse sequences of the brain and surrounding structures were obtained without intravenous contrast. COMPARISON:  Head CT same day FINDINGS: Brain: Motion degraded study. No acute finding affects the brainstem or cerebellum. 8 mm acute infarction in the posterior limb internal capsule on the right. Punctate acute infarction in the left caudate head or white matter adjacent to the frontal horn of the lateral ventricle. 8 mm acute to subacute infarction in  the white matter adjacent to the atrium of the right lateral ventricle. Other white matter disease seen at CT is not acute or subacute. Old right basal ganglia infarction. No hydrocephalus. No sign of gross hemorrhage. No extra-axial collection. IMPRESSION: Diffusion only study is degraded by motion. 8 mm acute infarction in the posterior limb internal capsule on the right. Punctate acute infarction either in the left caudate head or  white matter adjacent to the frontal horn of the left lateral ventricle. 8 mm acute to subacute infarction in the white matter adjacent to the atrium of the right lateral ventricle. Old small vessel infarctions elsewhere affecting the hemispheric white matter and right basal ganglia. Electronically Signed   By: Nelson Chimes M.D.   On: 05/04/2019 20:58   ECHOCARDIOGRAM COMPLETE  Result Date: 05/05/2019    ECHOCARDIOGRAM REPORT   Patient Name:   AMAURIA WILLCOX Date of Exam: 05/05/2019 Medical Rec #:  [id number]       Height:       64.6 in Accession #:    [id number]      Weight:       89.1 lb Date of Birth:  06-Oct-1955        BSA:          1.396 m Patient Age:    55 years        BP:           156/74 mmHg Patient Gender: F               HR:           59 bpm. Exam Location:  Inpatient Procedure: 2D Echo Indications:    TIA 435.9 / G45.9  History:        Patient has no prior history of Echocardiogram examinations.                 Stroke; Risk Factors:Dyslipidemia and Hypertension.  Sonographer:    Vikki Ports Turrentine Referring Phys: [id number] JAN A [place]  1. Normal LV systolic function; grade 1 diastolic dysfunction.  2. Left ventricular ejection fraction, by estimation, is 60 to 65%. The left ventricle has normal function. The left ventricle has no regional wall motion abnormalities. Left ventricular diastolic parameters are consistent with Grade I diastolic dysfunction (impaired relaxation).  3. Right ventricular systolic function is normal. The right ventricular size is normal.   4. The mitral valve is normal in structure. Trivial mitral valve regurgitation. No evidence of mitral stenosis.  5. The aortic valve is tricuspid. Aortic valve regurgitation is not visualized. No aortic stenosis is present.  6. The inferior vena cava is normal in size with greater than 50% respiratory variability, suggesting right atrial pressure of 3 mmHg. FINDINGS  Left Ventricle: Left ventricular ejection fraction, by estimation, is 60 to 65%. The left ventricle has normal function. The left ventricle has no regional wall motion abnormalities. The left ventricular internal cavity size was normal in size. There is  no left ventricular hypertrophy. Left ventricular diastolic parameters are consistent with Grade I diastolic dysfunction (impaired relaxation). Right Ventricle: The right ventricular size is normal. Right ventricular systolic function is normal. Left Atrium: Left atrial size was normal in size. Right Atrium: Right atrial size was normal in size. Pericardium: There is no evidence of pericardial effusion. Mitral Valve: The mitral valve is normal in structure. Normal mobility of the mitral valve leaflets. Trivial mitral valve regurgitation. No evidence of mitral valve stenosis. Tricuspid Valve: The tricuspid valve is normal in structure. Tricuspid valve regurgitation is trivial. No evidence of tricuspid stenosis. Aortic Valve: The aortic valve is tricuspid. Aortic valve regurgitation is not visualized. No aortic stenosis is present. Aortic valve mean gradient measures 4.0 mmHg. Aortic valve peak gradient measures 8.0 mmHg. Aortic valve area, by VTI measures 2.11 cm. Pulmonic Valve: The pulmonic valve was normal in structure. Pulmonic valve regurgitation is not visualized. No evidence of pulmonic stenosis.  Aorta: The aortic root is normal in size and structure. Venous: The inferior vena cava is normal in size with greater than 50% respiratory variability, suggesting right atrial pressure of 3 mmHg.   Additional Comments: Normal LV systolic function; grade 1 diastolic dysfunction.  LEFT VENTRICLE PLAX 2D LVIDd:         3.90 cm  Diastology LVIDs:         2.60 cm  LV e' lateral:   10.80 cm/s LV PW:         0.90 cm  LV E/e' lateral: 8.6 LV IVS:        0.90 cm  LV e' medial:    7.07 cm/s LVOT diam:     1.90 cm  LV E/e' medial:  13.2 LV SV:         72 LV SV Index:   52 LVOT Area:     2.84 cm  RIGHT VENTRICLE RV S prime:     12.70 cm/s TAPSE (M-mode): 2.2 cm LEFT ATRIUM             Index LA diam:        2.80 cm 2.01 cm/m LA Vol (A2C):   35.4 ml 25.35 ml/m LA Vol (A4C):   38.9 ml 27.86 ml/m LA Biplane Vol: 39.7 ml 28.43 ml/m  AORTIC VALVE AV Area (Vmax):    2.27 cm AV Area (Vmean):   2.26 cm AV Area (VTI):     2.11 cm AV Vmax:           141.00 cm/s AV Vmean:          97.700 cm/s AV VTI:            0.342 m AV Peak Grad:      8.0 mmHg AV Mean Grad:      4.0 mmHg LVOT Vmax:         113.00 cm/s LVOT Vmean:        78.000 cm/s LVOT VTI:          0.255 m LVOT/AV VTI ratio: 0.75  AORTA Ao Root diam: 2.90 cm MITRAL VALVE MV Area (PHT): 2.66 cm    SHUNTS MV Decel Time: 285 msec    Systemic VTI:  0.26 m MV E velocity: 93.10 cm/s  Systemic Diam: 1.90 cm MV A velocity: 99.90 cm/s MV E/A ratio:  0.93 Kirk Ruths MD Electronically signed by Kirk Ruths MD Signature Date/Time: 05/05/2019/2:17:46 PM    Final     Labs:  Basic Metabolic Panel: Recent Labs  Lab 05/12/19 1652 05/15/19 0544  NA  --  140  K  --  3.4*  CL  --  105  CO2  --  25  GLUCOSE  --  102*  BUN  --  13  CREATININE 0.80 0.75  CALCIUM  --  9.0    CBC: Recent Labs  Lab 05/12/19 1652 05/15/19 0544  WBC 8.1 6.6  NEUTROABS  --  4.6  HGB 14.0 12.2  HCT 43.7 38.8  MCV 89.2 89.6  PLT 361 315    CBG: No results for input(s): GLUCAP in the last 168 hours.  Family history.  Mother with CVA.  Father with CAD.  Paternal uncle and grandmother with CAD.  Negative colon cancer esophageal cancer stomach cancer  Brief HPI:   Stacie [name]  is a 64 y.o. right-handed female with history of hypertension, hyperlipidemia, tobacco abuse as well as marijuana.  She lives with her ex-husband independent prior to admission.  Two-level home bed and bath on main level.  Presented for 02/22/2019 with bilateral lower extremity weakness and difficulty with ambulation with facial droop and dysarthria.  Cranial CT scan age-indeterminate infarctions within the right frontal lobe white matter and right basal ganglia possibly acute or subacute.  MRI showed 8 mm acute infarction posterior limb internal capsule on the right.  Punctate acute infarct either in the left caudate head or white matter adjacent to the frontal horn of the left lateral ventricle.  8 mm acute to subacute infarct in the white matter adjacent to the atrium of the right lateral ventricle.  Patient did not receive TPA.  CT angiogram head and neck no emergent findings.  Echocardiogram with ejection fraction 65% without emboli.  Admission chemistries unremarkable except potassium 3.3 alcohol negative urine drug screen positive marijuana urinalysis greater than 70,000 ESBL.  Maintained on aspirin and Plavix for CVA prophylaxis x3 weeks then aspirin alone.  Subcutaneous Lovenox for DVT prophylaxis.  Therapy evaluations completed and patient was admitted for a comprehensive rehab program   Hospital Course: Stacie [name] was admitted to rehab 05/12/2019 for inpatient therapies to consist of PT, ST and OT at least three hours five days a week. Past admission physiatrist, therapy team and rehab RN have worked together to provide customized collaborative inpatient rehab.  Pertaining to patient's multiple small subcortical infarcts right punctate left caudate secondary small vessel disease she remained on aspirin and Plavix x3 weeks then aspirin alone she would follow-up neurology services.  Initially with subcutaneous Lovenox for DVT prophylaxis discontinued as patient was ambulatory.  Blood pressures  monitored on Norvasc 10 mg daily as well as hydralazine 100 mg every 8 hours and Imdur.  No orthostasis she would follow-up with her primary MD.  Mood stabilization with Lexapro as indicated she was attending full therapies.  ESBL UTI contact precautions she remained afebrile.  Zocor ongoing for hyperlipidemia.  Urine drug screen was positive for marijuana she also had a history of alcohol tobacco use she is maintained on NicoDerm patch as well to see full counts regards to cessation of no alcohol smoking or illicit drug use.   Blood pressures were monitored on TID basis and controlled  Stacie [name] is continent of bowel and bladder.  Stacie [name] has made gains during rehab stay and is attending therapies  Stacie [name] will continue to receive follow up therapies   after discharge  Rehab course: During patient's stay in rehab weekly team conferences were held to monitor patient's progress, set goals and discuss barriers to discharge. At admission, patient required min mod assist 200 feet 1 person hand-held assist, minimal assist sit to supine minimal assist supine to sit.  Minimal assist grooming mod assist toilet transfers  Physical exam.  Blood pressure 190/84 pulse 69 temperature 98.2 respirations 18 oxygen saturation 98% room air Constitutional alert and oriented well-developed HEENT Head.  Normocephalic and atraumatic Neck.  Supple nontender no JVD without thyromegaly Eyes pupils round and reactive to light no discharge without nystagmus Cardiac regular rate rhythm without any extra sounds or murmur heard Respiratory effort normal no respiratory distress without wheeze Abdomen.  Soft nontender positive bowel sounds without rebound Musculoskeletal no tenderness deformity or edema Neurological alert and oriented left central 7 and 12.  Speech clear normal language.  Reasonable insight and awareness.  Mild left inattention.  Right upper extremity right lower extremity 4+/5.  Left upper extremity 4 -/5 proximal to  distal.  Left lower extremity 4/5.  Sensation intact to light touch Skin.  She did have some healed scabbed abrasions bilateral forearms.  Stacie [name]  has had improvement in activity tolerance, balance, postural control as well as ability to compensate for deficits. Stacie [name] has had improvement in functional use RUE/LUE  and RLE/LLE as well as improvement in awareness.  Patient ambulates to the rehab gym no assistive device supervision.  Sessions focused on high-level balance including step outs with Maxi sky for safety.  Patient perform cross stepping contact-guard assist.  She can ambulate back to her room with supervision.  Patient is able to gather belongings for activities day living and homemaking.  Patient's daughter remained on hand for family teaching.  Full family teaching completed discharge to home       Disposition: Discharge to home    Diet: Mechanical soft  Special Instructions: No driving smoking alcohol or illicit drug use  Continue aspirin and Plavix x3 weeks then aspirin alone  Medications at discharge 1.  Tylenol as needed 2.  Norvasc 10 mg p.o. daily 3.  Aspirin 81 mg daily 4.  Plavix 75 mg daily x12 more doses of stop 5.  Lexapro 20 mg p.o. daily 6.  Folic acid 1 mg p.o. daily 7.  Hydralazine 100 mg every 8 hours 8.  Imdur 30 mg p.o. daily 9.  Multivitamin daily 10.  NicoDerm patch taper as directed 11.  Zocor 20 mg daily   Discharge Instructions    Ambulatory Referral to Neuro Rehab   Complete by: As directed    Ambulatory referral to Neurology   Complete by: As directed    An appointment is requested in approximately 4 weeks multisubcortical right punctate left caudate infarction due to small vessel disease   Ambulatory referral to Occupational Therapy   Complete by: As directed    eval and treat   Ambulatory referral to Physical Medicine Rehab   Complete by: As directed    Moderate complexity follow-up 1 to 2 weeks multiple subcortical right punctate and  left caudate infarction   Ambulatory referral to Physical Therapy   Complete by: As directed    Ambulatory referral to Physical Therapy   Complete by: As directed    eval and treat   Ambulatory referral to Speech Therapy   Complete by: As directed    eval and treat      Follow-up Information    Kirsteins, Luanna Salk, MD Follow up.   Specialty: Physical Medicine and Rehabilitation Why: Office to call for appointment Contact information: [place] [address] [phone]           Signed: Cathlyn Parsons 05/19/2019, 5:24 AM

## 2019-05-18 NOTE — Plan of Care (Signed)

## 2019-05-18 NOTE — Plan of Care (Signed)
  Problem: Consults Goal: RH STROKE PATIENT EDUCATION Description: See Patient Education module for education specifics  Outcome: Progressing   Problem: RH SKIN INTEGRITY Goal: RH STG SKIN FREE OF INFECTION/BREAKDOWN Description: Pt will be free of skin breakdown with mod I assist  Outcome: Progressing Goal: RH STG ABLE TO PERFORM INCISION/WOUND CARE W/ASSISTANCE Description: STG Able To Perform Incision/Wound Care With mod I Assistance. Outcome: Progressing   Problem: RH SAFETY Goal: RH STG ADHERE TO SAFETY PRECAUTIONS W/ASSISTANCE/DEVICE Description: STG Adhere to Safety Precautions With cues/reminders Assistance/Device. Outcome: Progressing   Problem: RH KNOWLEDGE DEFICIT Goal: RH STG INCREASE KNOWLEDGE OF HYPERTENSION Description: Pt will be able to verbalize management of HTN with diet regimen and medication compliance with mod I assist using handouts/booklets Outcome: Progressing Goal: RH STG INCREASE KNOWLEDGE OF STROKE PROPHYLAXIS Description: Pt will be able to verbalize management of stroke prevention with diet regimen and medication compliance with mod I assist using handouts/booklets  Outcome: Progressing

## 2019-05-18 NOTE — Care Management (Signed)
   The overall goal for the admission was met for:   Discharge location: Home with ex-husband  Length of Stay: 7 days with discharge 05/19/19  Discharge activity level:Supervision level  Home/community participation: Limited Participation  Services provided included: MD, RD, PT, OT, SLP, RN, CM, TR, Pharmacy, Neuropsych and SW  Financial Services: Medicare  Follow-up services arranged: Outpatient: PT, OT, SLP, DME: TTB and Patient/Family has no preference for HH/DME agencies  Comments (or additional information): [facility] [address] [place], [place] [address] [phone]  Evaluation Appointments:  PT: 05/29/19 @ 2:00 p.m.  SLP: 06/01/19 @ 4:15 p.m.  OT: 06/07/19 _0 :00 a.m.  Patient/Family verbalized understanding of follow-up arrangements: Yes  Individual responsible for coordination of the follow-up plan: Daughter:Megan Horsely [phone] Ex-husband; Nykiah Ma: [phone]  Confirmed access to DME recommended: Margarito Liner 05/18/2019  TTB not available via in house supply; other options provided for the purchase of recommended equipment.  TTB can be purchased at the  [place], [place]  [phone]  Margarito Liner

## 2019-05-18 NOTE — Progress Notes (Signed)
Speech Language Pathology Discharge Summary  Patient Details  Name: Stacie [name] MRN: [id number] Date of Birth: 12-12-1955  Today's Date: 05/18/2019 SLP Individual Time: 1415-1457 SLP Individual Time Calculation (min): 42 min   Skilled Therapeutic Interventions:  Pt was seen for skilled ST targeting education with pt and her caregiver, who she lives with (ex-husband, [name]). SLP facilitated session with verbal review and handouts regarding current diet recommendations (dysphagia 3/thins), dysarthria and intelligibility strategies, as well as cognitive goals and compensatory strategies. SLP also discussed impulsivity and potential impact on safety at home and reinforced recommendation for 24/7 supervision at home. Pt also demonstrated ability to anticipate her equipment needs as well as 2 activities she would need Jerry's assistance with for safety at d/c Mod I. Pt left sitting in bed with family still present, needs within reach, and questions answered to pt and husband's satisfaction.        Patient has met 5 of 6 long term goals.  Patient to discharge at overall Modified Independent;Supervision level.  Reasons goals not met:     Clinical Impression/Discharge Summary:   Pt made functional gains and met 5 out of 6 long term goals this admission. Pt currently requires Supervision assist for complex familiar problem solving, and Mod I for anticipatory awareness and use of speech intelligibility strategies for mild dysarthria and is 100% intelligible. She is mildly impulsive, which also impacts her safety awareness. Pt will require 24/7 supervision at home. Pt is consuming dysphagia 3 (mechanical soft) diet with thin liquids and is Mod I for use of strategies for oral clearance. Pt has demonstrated improved sustained attention, semi-complex to complex problem solving, awareness, and ability to detect errors. Although very mild left visual inattention still evident, she has demonstrated ability to  compensate for this well and it impacts her very minimally at a functional level now. However, given cognitive deficits marked by deficits in executive functioning, complex problem solving, and mild dysphagia still present, recommend pt continue to receive skilled ST services upon discharge. Pt and family education is complete at this time.    Care Partner:  Caregiver Able to Provide Assistance: Yes  Type of Caregiver Assistance: Cognitive  Recommendation:  Outpatient SLP;24 hour supervision/assistance;Home Health SLP  Rationale for SLP Follow Up: Maximize cognitive function and independence;Reduce caregiver burden;Maximize swallowing safety   Equipment: none   Reasons for discharge: Discharged from hospital   Patient/Family Agrees with Progress Made and Goals Achieved: Yes    Arbutus Leas 05/18/2019, 7:23 AM

## 2019-05-19 LAB — CREATININE, SERUM
Creatinine, Ser: 0.75 mg/dL (ref 0.44–1.00)
GFR calc Af Amer: >60 mL/min (ref >60)
GFR calc non Af Amer: >60 mL/min (ref >60)

## 2019-05-19 MED ORDER — ISOSORBIDE MONONITRATE ER 30 MG PO TB24: ORAL_TABLET | ORAL | 0 refills | Status: DC

## 2019-05-19 NOTE — Discharge Instructions (Signed)
Inpatient Rehab Discharge Instructions  Talula Wissel Discharge date and time: No discharge date for patient encounter.   Activities/Precautions/ Functional Status: Activity: activity as tolerated Diet: soft Wound Care: none needed Functional status:  ___ No restrictions     ___ Walk up steps independently ___ 24/7 supervision/assistance   ___ Walk up steps with assistance ___ Intermittent supervision/assistance  ___ Bathe/dress independently ___ Walk with walker     _x__ Bathe/dress with assistance ___ Walk Independently    ___ Shower independently ___ Walk with assistance    ___ Shower with assistance ___ No alcohol     ___ Return to work/school ________   COMMUNITY REFERRALS UPON DISCHARGE:  Outpatient: PT ,OT, ST  Agency:[facility] Phone:[phone]  Appointment Date/Time:  PT: 05/29/19 @ 2:00 p.m.  SLP: 06/01/19 @ 4:15 p.m.  OT: 06/07/19 @11 :00 a.m.  Medical Equipment/Items Recommended:TTB  Agency/Supplier:[facility] on [facility] Or from another store of choice out of pocket charge.   Special Instructions: No driving smoking or alcohol  Aspirin and Plavix x3 weeks then aspirin alone   STROKE/TIA DISCHARGE INSTRUCTIONS SMOKING Cigarette smoking nearly doubles your risk of having a stroke & is the single most alterable risk factor  If you smoke or have smoked in the last 12 months, you are advised to quit smoking for your health.  Most of the excess cardiovascular risk related to smoking disappears within a year of stopping.  Ask you doctor about anti-smoking medications  [place] Quit Line: 1-800-QUIT NOW  Free Smoking Cessation Classes [phone]  CHOLESTEROL Know your levels; limit fat & cholesterol in your diet  Lipid Panel     Component Value Date/Time   CHOL 198 05/05/2019 0538   TRIG 113 05/05/2019 0538   HDL 50 05/05/2019 0538   CHOLHDL 4.0 05/05/2019 0538   VLDL 23 05/05/2019 0538   LDLCALC 125 (H) 05/05/2019 0538   LDLCALC 164 (H) 06/04/2017 1434      Many patients benefit from treatment even if their cholesterol is at goal.  Goal: Total Cholesterol (CHOL) less than 160  Goal:  Triglycerides (TRIG) less than 150  Goal:  HDL greater than 40  Goal:  LDL (LDLCALC) less than 100   BLOOD PRESSURE American Stroke Association blood pressure target is less that 120/80 mm/Hg  Your discharge blood pressure is:  BP: (!) 156/74  Monitor your blood pressure  Limit your salt and alcohol intake  Many individuals will require more than one medication for high blood pressure  DIABETES (A1c is a blood sugar average for last 3 months) Goal HGBA1c is under 7% (HBGA1c is blood sugar average for last 3 months)  Diabetes: No known diagnosis of diabetes    Lab Results  Component Value Date   HGBA1C 5.5 05/05/2019     Your HGBA1c can be lowered with medications, healthy diet, and exercise.  Check your blood sugar as directed by your physician  Call your physician if you experience unexplained or low blood sugars.  PHYSICAL ACTIVITY/REHABILITATION Goal is 30 minutes at least 4 days per week  Activity: Increase activity slowly, Therapies: Physical Therapy: Home Health Return to work:   Activity decreases your risk of heart attack and stroke and makes your heart stronger.  It helps control your weight and blood pressure; helps you relax and can improve your mood.  Participate in a regular exercise program.  Talk with your doctor about the best form of exercise for you (dancing, walking, swimming, cycling).  DIET/WEIGHT Goal is to maintain a healthy weight  Your discharge diet is:  Diet Order            DIET DYS 3 Room service appropriate? Yes; Fluid consistency: Thin  Diet effective now              liquids Your height is:  Height: 5\' 4"  (162.6 cm) Your current weight is: Weight: 43.8 kg Your Body Mass Index (BMI) is:  BMI (Calculated): 16.56  Following the type  of diet specifically designed for you will help prevent another stroke.  Your goal weight range is:    Your goal Body Mass Index (BMI) is 19-24.  Healthy food habits can help reduce 3 risk factors for stroke:  High cholesterol, hypertension, and excess weight.  RESOURCES Stroke/Support Group:  Call [phone]   STROKE EDUCATION PROVIDED/REVIEWED AND GIVEN TO PATIENT Stroke warning signs and symptoms How to activate emergency medical system (call 911). Medications prescribed at discharge. Need for follow-up after discharge. Personal risk factors for stroke. Pneumonia vaccine given:  Flu vaccine given:  My questions have been answered, the writing is legible, and I understand these instructions.  I will adhere to these goals & educational materials that have been provided to me after my discharge from the hospital.    and Plavix x3 weeks then aspirin alone   My questions have been answered and I understand these instructions. I will adhere to these goals and the provided educational materials after my discharge from the hospital.  Patient/Caregiver Signature _______________________________ Date __________  Clinician Signature _______________________________________ Date __________  Please bring this form and your medication list with you to all your follow-up doctor's appointments.

## 2019-05-19 NOTE — Progress Notes (Signed)
Pt was was D/C with all personal belongings and left the unit with her ex- husband. All questions were answered and pt states she will attend all follow up appts. Doy Hutching, LPN

## 2019-05-23 ENCOUNTER — Telehealth: Payer: Self-pay

## 2019-05-23 NOTE — Telephone Encounter (Signed)
Transitional Care call--Daughter-Megan Horsely    1. Are you/is patient experiencing any problems since coming home? No Are there any questions regarding any aspect of care? No 2. Are there any questions regarding medications administration/dosing? No Are meds being taken as prescribed? Yes Patient should review meds with caller to confirm 3. Have there been any falls? No 4. Has Home Health been to the house and/or have they contacted you? Yes If not, have you tried to contact them? Outpt Can we help you contact them? 5. Are bowels and bladder emptying properly? Yes Are there any unexpected incontinence issues? No If applicable, is patient following bowel/bladder programs? 6. Any fevers, problems with breathing, unexpected pain? No 7. Are there any skin problems or new areas of breakdown? No 8. Has the patient/family member arranged specialty MD follow up (ie cardiology/neurology/renal/surgical/etc)? Yes  Can we help arrange? 9. Does the patient need any other services or support that we can help arrange? No 10. Are caregivers following through as expected in assisting the patient? Yes 11. Has the patient quit smoking, drinking alcohol, or using drugs as recommended? Yes  Appointment time 1:30 pm, arrive time 1:15 pm and with Dr. Letta Pate [address]

## 2019-05-29 ENCOUNTER — Other Ambulatory Visit: Payer: Self-pay

## 2019-05-29 ENCOUNTER — Ambulatory Visit: Payer: Medicare Other | Attending: Physician Assistant

## 2019-05-29 DIAGNOSIS — R2689 Other abnormalities of gait and mobility: Secondary | ICD-10-CM | POA: Insufficient documentation

## 2019-05-29 DIAGNOSIS — R2681 Unsteadiness on feet: Secondary | ICD-10-CM | POA: Diagnosis not present

## 2019-05-29 NOTE — Therapy (Addendum)
[facility] [address] [place], [place], [address] Phone: [phone]   Fax:  [phone]  Physical Therapy Evaluation  Patient Details  Name: Stacie [name] MRN: [id number] Date of Birth: [birth year] No data recorded  Encounter Date: 05/29/2019    Past Medical History:  Diagnosis Date  . Colitis   . Hyperlipidemia   . Hypertension   . Stroke ([place]) 04/2012   unknown type  . Thyroid disease     History reviewed. No pertinent surgical history.  There were no vitals filed for this visit.   Subjective Assessment - 05/29/19 1412    Currently in Pain?  No/denies         05/29/19 1800  Symptoms/Limitations  Subjective Pt reports she was taken to ED due to facila droop and slurred speech on 05/04/19. patient has hx of alcohol abuse and drinks everyday. Since the stroke, she has not been smoking or drinking. pt reports she is not wearing her Nicotine patch. Patient reports she lives with her ex-husband. She is independent with everything at home and not using AD.  Pertinent History hx of CVA, alcohol abuse  Limitations Walking  How long can you sit comfortably? no complaints  How long can you stand comfortably? no complaints  How long can you walk comfortably? feels unsteady while walking  Diagnostic tests IMPRESSION:Diffusion only study is degraded by motion. 8 mm acute infarction inthe posterior limb internal capsule on the right. Punctate acuteinfarction either in the left caudate head or white matter adjacentto the frontal horn of the left lateral ventricle. 8 mm acute tosubacute infarction in the white matter adjacent to the atrium ofthe right lateral ventricle. Old small vessel infarctions elsewhereaffecting the hemispheric white matter and right basal ganglia. 05/05/19 CT: IMPRESSION:1. No emergent finding.2. Cervical and intracranial atherosclerosis without flow reducingstenosis or ulceration of major  vessels.  Patient Stated Goals improve walking  Pain Assessment  Currently in Pain? No/denies               Objective measurements completed on examination: See above findings.       05/29/19 0001  Assessment  Medical Diagnosis CVA  Referring Provider (PT) Lauraine Rinne, PA-C  Onset Date/Surgical Date 05/04/19  Prior Therapy no  Precautions  Precautions Fall  Restrictions  Weight Bearing Restrictions No  Balance Screen  Has the patient fallen in the past 6 months Yes  How many times? 2  Has the patient had a decrease in activity level because of a fear of falling?  No  Is the patient reluctant to leave their home because of a fear of falling?  No  Home Quarry manager residence  Living Arrangements Spouse/significant other  Available Help at Discharge Family  Type of [place] to enter  Entrance Stairs-Number of Steps 3  Entrance Stairs-Rails Right;Left;Can reach both  [place] One level  [place] None  Prior Function  Level of Independence Independent  Vocation Unemployed  Observation/Other Assessments  Observations Pt kept rubbing her hands, mildly irritable, anxious  ROM / Strength  AROM / PROM / Strength Strength  Strength  Strength Assessment Site Hip;Knee;Ankle  Right/Left Hip Right;Left  Right/Left Knee Right;Left  Right/Left Ankle Right;Left  Right Hip Flexion 4/5  Right Hip ABduction 5/5  Right Hip ADduction 5/5  Left Hip Flexion 4/5  Left Hip ABduction 5/5  Left Hip ADduction 5/5  Right Knee Flexion 5/5  Right Knee Extension 5/5  Left  Knee Flexion 5/5  Left Knee Extension 5/5  Right Ankle Dorsiflexion 5/5  Right Ankle Plantar Flexion 5/5  Left Ankle Dorsiflexion 5/5  Left Ankle Plantar Flexion 5/5  Ambulation/Gait  Ambulation/Gait Yes  Ambulation/Gait Assistance 7: Independent  Ambulation Distance (Feet) 230 Feet  Assistive device None  Gait Pattern Wide base of  support;Antalgic  Ambulation Surface Level  Ramp 7: Independent  Curb 7: Independent  Standardized Balance Assessment  Standardized Balance Assessment Berg Balance Test;Dynamic Gait Index  Berg Balance Test  Sit to Stand 3  Standing Unsupported 4  Sitting with Back Unsupported but Feet Supported on Floor or Stool 4  Stand to Sit 4  Transfers 4  Standing Unsupported with Eyes Closed 4  Standing Unsupported with Feet Together 4  From Standing, Reach Forward with Outstretched Arm 4  From Standing Position, Pick up Object from Floor 4  From Standing Position, Turn to Look Behind Over each Shoulder 4  Turn 360 Degrees 3  Standing Unsupported, Alternately Place Feet on Step/Stool 4  Standing Unsupported, One Foot in Front 3  Standing on One Leg 1  Total Score 50  Berg comment: low fall risk  Dynamic Gait Index  Level Surface 2  Change in Gait Speed 2  Gait with Horizontal Head Turns 2  Gait with Vertical Head Turns 2  Gait and Pivot Turn 3  Step Over Obstacle 3  Step Around Obstacles 3  Steps 2  Total Score 19                 05/29/19 1814  PT SHORT TERM GOAL #1  Title Patient will be able to perform SLS for 5 seconds to improve static balance  Baseline 3 sec  Time 4  Period Weeks  Status New  Target Date 06/26/19  PT SHORT TERM GOAL #2  Title PT will be able to perform 5 sit to stands without use of UE to improve functional strength and balance.  Baseline multiple tries, poor descending control  Time 4  Period Weeks  Status New  Target Date 06/26/19  PT SHORT TERM GOAL #3  Title Pt will demo I and compliance with HEP to self manage symptoms.  Baseline 4  Period Weeks  Status New      05/29/19 1817  PT LONG TERM GOAL #1  Title Patient will demo 10 sec single leg stance to improve overall balance  Baseline 3 sec  Time 8  Period Weeks  Status New  Target Date 07/24/19  PT LONG TERM GOAL #2  Title Patient will demo >23/26 on DGI to improve  functional balance with ambulation and reduce fall risk  Baseline 19/26  Time 8  Period Weeks  Status New  Target Date 07/24/19            05/29/19 1523  Plan  Clinical Impression Statement patient is a 64 y.o. female who was seen today for physical therapy and evaluation after CVA on 05/04/19. Patient is currently ambulating without AD. Patient is mostly independent with ADLs. Patient's objective impairments include decreased balance with ambulation, gait abnormalities, decreased strength of bil hip flexors. These impairments are limiting patient with functional ambulation and transfers. Patient will benefit from skilled PT to address these impairments and improve overall symptoms.  Personal Factors and Comorbidities Comorbidity 2;Past/Current Experience;Time since onset of injury/illness/exacerbation;Transportation  Comorbidities CHF, PAD, CVA, alcolhold abuse, tobacco abuse  Examination-Activity Limitations Caring for Others;Carry;Lift;Locomotion Level;Squat;Stairs;Transfers  Examination-Participation Restrictions Driving;Community Activity;Laundry;Yard Work;Shop;Meal Prep;Cleaning  Pt will benefit from  skilled therapeutic intervention in order to improve on the following deficits Abnormal gait;Decreased balance;Difficulty walking;Decreased activity tolerance;Decreased coordination;Decreased endurance;Decreased mobility;Decreased safety awareness;Decreased strength  Stability/Clinical Decision Making Evolving/Moderate complexity  Clinical Decision Making Moderate  Rehab Potential Good  PT Frequency 2x / week  PT Treatment/Interventions ADLs/Self Care Home Management;Gait training;Stair training;Functional mobility training;Therapeutic activities;Therapeutic exercise;Balance training;Neuromuscular re-education;Patient/family education;Vestibular  PT Next Visit Plan 5x sit to stand, coordinate appts between OT/ST (pt only has to pay $30 co pay per day if she sees all disciplines on one  day)  PT Home Exercise Plan Access Code: [email or web address]: [email or web address]: 04/26/2021Prepared by: Gwenyth Bouillon PatelExercisesSit to Stand with Arms Crossed - 2 x daily - 7 x weekly - 2 sets - 5 repsStanding Romberg to 1/2 Tandem Stance - 2 x daily - 7 x weekly - 1 sets - 3 reps - 30 hold  Consulted and Agree with Plan of Care Patient   Patient will benefit from skilled therapeutic intervention in order to improve the following deficits and impairments:     Visit Diagnosis:      P   ICD-10-CM   PL               1.   Unsteadiness on feet R26.81  Change Dx     2.   Other abnormalities of gait and mobility R26.89             Problem List Patient Active Problem List   Diagnosis Date Noted  . Protein-calorie malnutrition, severe 05/16/2019  . 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 Underweight 05/05/2019  . Depression 05/05/2019  . Tobacco abuse 05/05/2019  . Marijuana use 05/05/2019  . Alcohol abuse 05/05/2019  . Chronic diastolic CHF (congestive heart failure) ([place]) 05/05/2019  . CVA (cerebral vascular accident) ([place]) 05/04/2019  . Chronic diarrhea 08/26/2018  . Loss of weight 08/26/2018  . PAD (peripheral artery disease) ([place]) 12/06/2017  . Claudication of right lower extremity ([place]) 12/06/2017  . Hyperlipidemia 02/28/2016  . Essential hypertension 02/21/2016  . 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 Hearing loss 02/21/2016    Kerrie Pleasure, PT 05/29/2019, 2:45 PM  [facility] [address] [place], [place], [address] Phone: [phone]   Fax:  [phone]  Name: Stacie [name] MRN: [id number] Date of Birth: 02/10/1955

## 2019-06-01 ENCOUNTER — Encounter: Payer: Medicare Other | Attending: Physical Medicine & Rehabilitation | Admitting: Physical Medicine & Rehabilitation

## 2019-06-01 ENCOUNTER — Encounter: Payer: Medicare Other | Admitting: Speech Pathology

## 2019-06-07 ENCOUNTER — Encounter: Payer: Medicare Other | Admitting: Occupational Therapy

## 2019-06-19 ENCOUNTER — Other Ambulatory Visit: Payer: Self-pay

## 2019-06-20 ENCOUNTER — Ambulatory Visit (INDEPENDENT_AMBULATORY_CARE_PROVIDER_SITE_OTHER): Payer: Medicare Other | Admitting: Nurse Practitioner

## 2019-06-20 ENCOUNTER — Encounter: Payer: Self-pay | Admitting: Nurse Practitioner

## 2019-06-20 VITALS — BP 126/60 | HR 72 | Temp 97.8°F | Ht 64.0 in | Wt 99.6 lb

## 2019-06-20 DIAGNOSIS — I10 Essential (primary) hypertension: Secondary | ICD-10-CM | POA: Diagnosis not present

## 2019-06-20 DIAGNOSIS — F3341 Major depressive disorder, recurrent, in partial remission: Secondary | ICD-10-CM | POA: Diagnosis not present

## 2019-06-20 DIAGNOSIS — E782 Mixed hyperlipidemia: Secondary | ICD-10-CM | POA: Diagnosis not present

## 2019-06-20 DIAGNOSIS — I700 Atherosclerosis of aorta: Secondary | ICD-10-CM | POA: Diagnosis not present

## 2019-06-20 DIAGNOSIS — I69319 Unspecified symptoms and signs involving cognitive functions following cerebral infarction: Secondary | ICD-10-CM

## 2019-06-20 DIAGNOSIS — F1010 Alcohol abuse, uncomplicated: Secondary | ICD-10-CM

## 2019-06-20 DIAGNOSIS — I739 Peripheral vascular disease, unspecified: Secondary | ICD-10-CM

## 2019-06-20 LAB — BASIC METABOLIC PANEL
BUN: 15 mg/dL (ref 6–23)
CO2: 28 mEq/L (ref 19–32)
Calcium: 9.4 mg/dL (ref 8.4–10.5)
Chloride: 106 mEq/L (ref 96–112)
Creatinine, Ser: 0.83 mg/dL (ref 0.40–1.20)
GFR: 69.2 mL/min (ref >60.00)
Glucose, Bld: 91 mg/dL (ref 70–99)
Potassium: 4.3 mEq/L (ref 3.5–5.1)
Sodium: 141 mEq/L (ref 135–145)

## 2019-06-20 MED ORDER — SIMVASTATIN 20 MG PO TABS: 20.0000 mg | ORAL_TABLET | Freq: Every day | ORAL | 3 refills | Status: DC

## 2019-06-20 MED ORDER — HYDRALAZINE HCL 100 MG PO TABS: 100.0000 mg | ORAL_TABLET | Freq: Three times a day (TID) | ORAL | 11 refills | Status: DC

## 2019-06-20 MED ORDER — ISOSORBIDE MONONITRATE ER 30 MG PO TB24: ORAL_TABLET | ORAL | 3 refills | Status: DC

## 2019-06-20 MED ORDER — SIMVASTATIN 40 MG PO TABS: 40.0000 mg | ORAL_TABLET | Freq: Every day | ORAL | 3 refills | Status: DC

## 2019-06-20 MED ORDER — FOLIC ACID 1 MG PO TABS: 1.0000 mg | ORAL_TABLET | Freq: Every day | ORAL | 1 refills | Status: DC

## 2019-06-20 NOTE — Progress Notes (Signed)
Subjective:  Patient ID: Stacie [name], female    DOB: 1955-03-16  Age: 64 y.o. MRN: [id number]  CC: Hospitalization Follow-up (had a stroke 3 weeks ago//pt going to therapy//med refill folic acid an clopidogrel//no covid//no tetanus//no recent mammogram)  HPI Accompanied by Ex-husband Resolved diarrhea.  HTN with diastolic heart failure: Last echo 05/2019: normal EF, no LVH. Improved with hydralazine, imdur, and amlodipine. BP Readings from Last 3 Encounters:  06/20/19 126/60  05/19/19 (!) 177/85  05/12/19 (!) 174/90   Aortic artherosclerosis per CT ABD: Has PAD with claudication (walking) LDL not at goal, current use of  zocor 20mg  BP at goal Persistent tobacco use.  Hx of CVA  Completed inpatient rehab completed, ongoing outpatient OT and speech. Denies any fall since hospital discharge  Reviewed past Medical, Social and Family history today.  Outpatient Medications Prior to Visit  Medication Sig Dispense Refill  . acetaminophen (TYLENOL) 325 MG tablet Take 2 tablets (650 mg total) by mouth every 4 (four) hours as needed for mild pain (or temp > 37.5 C (99.5 F)).    Marland Kitchen amLODipine (NORVASC) 10 MG tablet Take 1 tablet (10 mg total) by mouth daily. 90 tablet 1  . aspirin EC 81 MG EC tablet Take 1 tablet (81 mg total) by mouth daily.    Marland Kitchen doxylamine, Sleep, (UNISOM) 25 MG tablet Take 25 mg by mouth at bedtime as needed for sleep.     Marland Kitchen escitalopram (LEXAPRO) 20 MG tablet TAKE 1 TABLET(20 MG) BY MOUTH DAILY 90 tablet 3  . Multiple Vitamin (MULTIVITAMIN WITH MINERALS) TABS tablet Take 1 tablet by mouth daily.    . nicotine (NICODERM CQ - DOSED IN MG/24 HOURS) 14 mg/24hr patch 14 mg patch daily x2 weeks then 7 mg patch daily x3 weeks and stop 28 patch 0  . folic acid (FOLVITE) 1 MG tablet Take 1 tablet (1 mg total) by mouth daily. 30 tablet 0  . hydrALAZINE (APRESOLINE) 100 MG tablet Take 1 tablet (100 mg total) by mouth every 8 (eight) hours. 90 tablet 0  . isosorbide  mononitrate (IMDUR) 30 MG 24 hr tablet TAKE 1 TABLET(30 MG) BY MOUTH DAILY 90 tablet 0  . simvastatin (ZOCOR) 20 MG tablet Take 1 tablet (20 mg total) by mouth daily at 6 PM. 30 tablet 1  . clopidogrel (PLAVIX) 75 MG tablet Take 1 tablet (75 mg total) by mouth daily. (Patient not taking: Reported on 06/20/2019) 14 tablet 0   No facility-administered medications prior to visit.   ROS See HPI  Objective:  BP 126/60   Pulse 72   Temp 97.8 F (36.6 C) (Tympanic)   Ht 5\' 4"  (1.626 m)   Wt 99 lb 9.6 oz (45.2 kg)   SpO2 98%   BMI 17.10 kg/m   BP Readings from Last 3 Encounters:  06/20/19 126/60  05/19/19 (!) 177/85  05/12/19 (!) 174/90   Wt Readings from Last 3 Encounters:  06/20/19 99 lb 9.6 oz (45.2 kg)  05/12/19 96 lb 8 oz (43.8 kg)  10/06/18 89 lb 2 oz (40.4 kg)   Physical Exam Cardiovascular:     Rate and Rhythm: Normal rate and regular rhythm.     Pulses: Normal pulses.          Dorsalis pedis pulses are 2+ on the right side and 2+ on the left side.       Posterior tibial pulses are 2+ on the right side and 2+ on the left side.  Heart sounds: Normal heart sounds.     Comments: Abdominal bruit noted Pulmonary:     Effort: Pulmonary effort is normal.     Breath sounds: Normal breath sounds.  Musculoskeletal:     Right lower leg: No edema.     Left lower leg: No edema.  Neurological:     Mental Status: She is alert.    Lab Results  Component Value Date   WBC 6.6 05/15/2019   HGB 12.2 05/15/2019   HCT 38.8 05/15/2019   PLT 315 05/15/2019   GLUCOSE 102 (H) 05/15/2019   CHOL 198 05/05/2019   TRIG 113 05/05/2019   HDL 50 05/05/2019   LDLCALC 125 (H) 05/05/2019   ALT 34 05/15/2019   AST 33 05/15/2019   NA 140 05/15/2019   K 3.4 (L) 05/15/2019   CL 105 05/15/2019   CREATININE 0.75 05/19/2019   BUN 13 05/15/2019   CO2 25 05/15/2019   TSH 1.609 05/04/2019   HGBA1C 5.5 05/05/2019    Assessment & Plan:  This visit occurred during the SARS-CoV-2 public  health emergency.  Safety protocols were in place, including screening questions prior to the visit, additional usage of staff PPE, and extensive cleaning of exam room while observing appropriate contact time as indicated for disinfecting solutions.   Stacie [name] was seen today for hospitalization follow-up.  Diagnoses and all orders for this visit:  Essential hypertension -     Basic metabolic panel -     isosorbide mononitrate (IMDUR) 30 MG 24 hr tablet; TAKE 1 TABLET(30 MG) BY MOUTH DAILY -     hydrALAZINE (APRESOLINE) 100 MG tablet; Take 1 tablet (100 mg total) by mouth every 8 (eight) hours.  Recurrent major depressive disorder, in partial remission (HCC)  Mixed hyperlipidemia -     Discontinue: simvastatin (ZOCOR) 20 MG tablet; Take 1 tablet (20 mg total) by mouth daily at 6 PM. -     simvastatin (ZOCOR) 40 MG tablet; Take 1 tablet (40 mg total) by mouth daily at 6 PM.  Aortic atherosclerosis (HCC) -     Discontinue: simvastatin (ZOCOR) 20 MG tablet; Take 1 tablet (20 mg total) by mouth daily at 6 PM. -     simvastatin (ZOCOR) 40 MG tablet; Take 1 tablet (40 mg total) by mouth daily at 6 PM.  Alcohol abuse -     folic acid (FOLVITE) 1 MG tablet; Take 1 tablet (1 mg total) by mouth daily.  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 Stacie [name]'s clopidogrel and simvastatin. I have also changed her simvastatin. Additionally, I am having her maintain her doxylamine (Sleep), aspirin, multivitamin with minerals, acetaminophen, amLODipine, escitalopram, nicotine, folic acid, isosorbide mononitrate, and hydrALAZINE.  Meds ordered this encounter  Medications  . folic acid (FOLVITE) 1 MG tablet    Sig: Take 1 tablet (1 mg total) by mouth daily.    Dispense:  90 tablet    Refill:  1    Order Specific Question:   Supervising Provider    Answer:   Ronnald Nian [id number]  . isosorbide mononitrate (IMDUR) 30 MG 24 hr tablet    Sig: TAKE 1  TABLET(30 MG) BY MOUTH DAILY    Dispense:  90 tablet    Refill:  3    **Patient requests 90 days supply**    Order Specific Question:   Supervising Provider    Answer:   Ronnald Nian [id number]  . DISCONTD: simvastatin (ZOCOR) 20 MG  tablet    Sig: Take 1 tablet (20 mg total) by mouth daily at 6 PM.    Dispense:  90 tablet    Refill:  3    Order Specific Question:   Supervising Provider    Answer:   Ronnald Nian [id number]  . hydrALAZINE (APRESOLINE) 100 MG tablet    Sig: Take 1 tablet (100 mg total) by mouth every 8 (eight) hours.    Dispense:  90 tablet    Refill:  11    Order Specific Question:   Supervising Provider    Answer:   Ronnald Nian [id number]  . simvastatin (ZOCOR) 40 MG tablet    Sig: Take 1 tablet (40 mg total) by mouth daily at 6 PM.    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)   Relevant Medications   isosorbide mononitrate (IMDUR) 30 MG 24 hr tablet   hydrALAZINE (APRESOLINE) 100 MG tablet   simvastatin (ZOCOR) 40 MG tablet   Essential hypertension - Primary   Relevant Medications   isosorbide mononitrate (IMDUR) 30 MG 24 hr tablet   hydrALAZINE (APRESOLINE) 100 MG tablet   simvastatin (ZOCOR) 40 MG tablet   Other Relevant Orders   Basic metabolic panel     Other   Alcohol abuse (Chronic)   Relevant Medications   folic acid (FOLVITE) 1 MG tablet   Depression (Chronic)   Hyperlipidemia   Relevant Medications   isosorbide mononitrate (IMDUR) 30 MG 24 hr tablet   hydrALAZINE (APRESOLINE) 100 MG tablet   simvastatin (ZOCOR) 40 MG tablet    Other Visit Diagnoses    Aortic atherosclerosis (HCC)       Relevant Medications   isosorbide mononitrate (IMDUR) 30 MG 24 hr tablet   hydrALAZINE  (APRESOLINE) 100 MG tablet   simvastatin (ZOCOR) 40 MG tablet      Follow-up: Return in about 3 months (around 09/20/2019) for HTN and , hyperlipidemia (F2F).  Wilfred Lacy, NP

## 2019-06-20 NOTE — Assessment & Plan Note (Signed)
LDL not at goal, current use of  zocor BP at goal Persistent tobacco use, nicotine patch rx provided during hospitalization, but she is not using.  Increase crestor dose to 40mg  Encourage to quit tobacco use F/up in 57months for repeat lipid panel and CK

## 2019-06-20 NOTE — Patient Instructions (Addendum)
Go to lab for blood draw Stop plavix and continue aspirin 81mg . F/up in 34months  You have an upcoming appt with neurology 07/05/2019.

## 2019-06-20 NOTE — Assessment & Plan Note (Signed)
Stable mood with lexapro

## 2019-06-20 NOTE — Assessment & Plan Note (Signed)
Improved BP

## 2019-06-21 ENCOUNTER — Ambulatory Visit: Payer: Medicare Other | Admitting: Occupational Therapy

## 2019-06-21 ENCOUNTER — Encounter: Payer: Self-pay | Admitting: Occupational Therapy

## 2019-06-21 ENCOUNTER — Ambulatory Visit: Payer: Medicare Other | Attending: Physician Assistant

## 2019-06-21 ENCOUNTER — Other Ambulatory Visit: Payer: Self-pay

## 2019-06-21 DIAGNOSIS — R2681 Unsteadiness on feet: Secondary | ICD-10-CM

## 2019-06-21 DIAGNOSIS — R471 Dysarthria and anarthria: Secondary | ICD-10-CM | POA: Diagnosis not present

## 2019-06-21 DIAGNOSIS — R41840 Attention and concentration deficit: Secondary | ICD-10-CM | POA: Diagnosis present

## 2019-06-21 DIAGNOSIS — R278 Other lack of coordination: Secondary | ICD-10-CM | POA: Diagnosis not present

## 2019-06-21 DIAGNOSIS — R41844 Frontal lobe and executive function deficit: Secondary | ICD-10-CM | POA: Diagnosis not present

## 2019-06-21 DIAGNOSIS — R41841 Cognitive communication deficit: Secondary | ICD-10-CM | POA: Insufficient documentation

## 2019-06-21 DIAGNOSIS — I69354 Hemiplegia and hemiparesis following cerebral infarction affecting left non-dominant side: Secondary | ICD-10-CM | POA: Diagnosis not present

## 2019-06-21 DIAGNOSIS — R1310 Dysphagia, unspecified: Secondary | ICD-10-CM | POA: Diagnosis not present

## 2019-06-21 DIAGNOSIS — I639 Cerebral infarction, unspecified: Secondary | ICD-10-CM | POA: Diagnosis not present

## 2019-06-21 NOTE — Therapy (Signed)
[facility] [address] [place], [place], [address] Phone: [phone]   Fax:  [phone]  Speech Language Pathology Evaluation  Patient Details  Name: Stacie [name] MRN: [id number] Date of Birth: [birth year] Referring Provider (SLP): Stann Mainland, [place]   Encounter Date: 06/21/2019  End of Session - 06/21/19 1708    Visit Number  1    Number of Visits  17    Date for SLP Re-Evaluation  09/19/19    SLP Start Time  0848    SLP Stop Time   0930    SLP Time Calculation (min)  42 min    Activity Tolerance  Patient tolerated treatment well   appeared anxious at times      Past Medical History:  Diagnosis Date  . Colitis   . Hyperlipidemia   . Hypertension   . Stroke ([place]) 04/2012   unknown type  . Thyroid disease     History reviewed. No pertinent surgical history.  There were no vitals filed for this visit.  Subjective Assessment - 06/21/19 0854    Subjective  "She gave me hints about what to do - speak louder, pronouce more." Pt relates that she "is careful" when she eats, reports coughing with liquids.    Currently in Pain?  No/denies         SLP Evaluation [facility] - 06/21/19 [id number]      SLP Visit Information   SLP Received On  06/21/19    Referring Provider (SLP)  Stann Mainland, PA    Onset Date  04 May 2019    Medical Diagnosis  CVA      Subjective   Subjective  Pt initially reports difficulty with liquids and upon furhter consultation with SLP, dysarthria. Does not endorse any differences cognitively.     Patient/Family Stated Goal  "Get better - not cough with my drinks."      General Information   HPI  Pt with previous CVA with resulting minor memory issues. PT reports she was "always making lists and crossing it off." CVA in early April 2021, sx of lt facial droop with dysarthria and weakness in both legs. Pt was on CIR for mild cognitive impairments, dysphagia, and dysarthria.        Prior Functional Status   Cognitive/Linguistic Baseline  Baseline deficits      Cognition   Overall Cognitive Status  Impaired/Different from baseline    Area of Impairment  Attention;Safety/judgement;Awareness;Problem solving    Current Attention Level  Sustained    Attention Comments  decr'd selective attention noted. likely some level of internal distraction. pt moderately impulsive. pt reports baseline anxiety.     Safety/Judgement  Decreased awareness of safety;Decreased awareness of deficits    Safety and Judgement Comments  affected by pt's level of impulsivity    Awareness Comments  pt with splinter skills with intellectual awareness. Initially denied any cognitive/"thinking" deficits but then when she noted her performance was affected on testing she endorsed "maybe" to having attention deficits. Emergent awareness with eval tasks was not observed.     Problem Solving  Requires verbal cues    Problem Solving Comments  Pt unable to make adequate correction in a simple visual sequencing task (trail making) on the CLQT    Behaviors  Restless;Impulsive   anxious behavior - pt states some of this is baseline     Auditory Comprehension   Overall Auditory Comprehension  Appears within functional limits for tasks assessed  Verbal Expression   Overall Verbal Expression  Appears within functional limits for tasks assessed      Oral Motor/Sensory Function   Overall Oral Motor/Sensory Function  Impaired    Labial ROM  Reduced left   slight   Labial Symmetry  Within Functional Limits    Labial Strength  Reduced Left    Labial Coordination  Reduced    Lingual Symmetry  Abnormal symmetry left   slight   Lingual Strength  Reduced Left    Lingual Coordination  Reduced   with AMR labial margins     Motor Speech   Overall Motor Speech  Impaired    Respiration  Within functional limits    Level of Impairment  Conversation    Phonation  Normal    Resonance  Hypernasality    intermittently mild/minor   Articulation  Impaired    Level of Impairment  Sentence    Intelligibility  Intelligible    Word  --   100   Phrase  --   100%   Sentence  --   100%   Conversation  75-100% accurate   near-100%   Motor Planning  Witnin functional limits    Effective Techniques  Slow rate;Over-articulate   suspect difficult to improve due to cognition                     SLP Education - 06/21/19 1707    Education Details  eval results thus far, possible foci of therapy    Person(s) Educated  Patient    Methods  Explanation    Comprehension  Verbalized understanding;Need further instruction       SLP Short Term Goals - 06/21/19 1715      SLP SHORT TERM GOAL #1   Title  pt will demo safe swallow strategies with rare min A and visual support x 3 sessions    Time  4    Period  Weeks   or 9 total visits - for STGs 1-3   Status  New      SLP SHORT TERM GOAL #2   Title  pt will demo selective attention adequate for working on simple tasks for 15 minutes in min-mod noisy environement x2 sessions    Time  4    Period  Weeks    Status  New      SLP SHORT TERM GOAL #3   Title  pt will demo intellectual awareness by telling SLP 3 non-physical deficits in 3 sessions    Time  4    Period  Weeks    Status  New      SLP SHORT TERM GOAL #4   Title  pt will complete simple linguistic-based detailed tasks 90% success given self correction, in 3 sessions    Time  6    Period  Weeks   or 13 total visits   Status  New      SLP SHORT TERM GOAL #5   Title  pt will demo problem solving resulting in 80% success in simple linguistic based tasks over three sesssions    Time  6    Period  Weeks   or 13 total visits   Status  New      Additional Short Term Goals   Additional Short Term Goals  Yes      SLP SHORT TERM GOAL #6   Title  pt will demo emergent awareness 90% of the time in linguistic-based tasks with  occasional min cues in 3 sessions    Time  6     Period  Weeks   or 13 total visits   Status  New       SLP Long Term Goals - 06/21/19 1728      SLP LONG TERM GOAL #1   Title  umber 1    Time  8      SLP LONG TERM GOAL #2   Title  2    Time  8      SLP LONG TERM GOAL #3   Title  3    Time  12      SLP LONG TERM GOAL #4   Title  4    Time  12      SLP LONG TERM GOAL #5   Title  5    Time  12      Additional Long Term Goals   Additional Long Term Goals  Yes      SLP LONG TERM GOAL #6   Title  6       Plan - 06/21/19 1709    Clinical Impression Statement  Pt presents with deficits in cognitive communication such as decr'd attention, problem solving, basic awareness. Pt level of impulsivity complicates/worsens her deficits. Pt also complains of coughing wiht liquids "about once a day" however pt awareness is affected so this could actually occur more frequently. Pt was independent prior to CVA (baseline mild memory deficits after previous CVA well compensated for), and now is living with daughter for supervision and assistance with IADLs/ADLs. Pt exhibits mild dysarthria due to oral motor weakness. She would benefit from skilled ST focusing on cognitive communcation defiicts, dysphagia compensations and possible exercises, and incr'd precision of speech articulation.    Speech Therapy Frequency  2x / week    Duration  --   12 weeks, or 25 visits   Treatment/Interventions  Aspiration precaution training;Pharyngeal strengthening exercises;Diet toleration management by SLP;Trials of upgraded texture/liquids;Cueing hierarchy;Cognitive reorganization;Internal/external aids;Patient/family education;Compensatory strategies;SLP instruction and feedback;Functional tasks;Oral motor exercises;Environmental controls    Potential to Achieve Goals  Good    Potential Considerations  Severity of impairments    Consulted and Agree with Plan of Care  Patient       Patient will benefit from skilled therapeutic intervention in order to  improve the following deficits and impairments:   Cognitive communication deficit  Dysarthria and anarthria  Dysphagia, unspecified type    Problem List Patient Active Problem List   Diagnosis Date Noted  . Aortic atherosclerosis ([place]) 06/20/2019  . Protein-calorie malnutrition, severe 05/16/2019  . 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 Underweight 05/05/2019  . Depression 05/05/2019  . Tobacco abuse 05/05/2019  . Marijuana use 05/05/2019  . Alcohol abuse 05/05/2019  . Chronic diastolic CHF (congestive heart failure) ([place]) 05/05/2019  . CVA (cerebral vascular accident) ([place]) 05/04/2019  . Chronic diarrhea 08/26/2018  . Loss of weight 08/26/2018  . PAD (peripheral artery disease) ([place]) 12/06/2017  . Claudication of right lower extremity ([place]) 12/06/2017  . Hyperlipidemia 02/28/2016  . Essential hypertension 02/21/2016  . 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 Hearing loss 02/21/2016    [facility] 06/21/2019, 5:33 PM  [facility] [address] [place], [place], [address] Phone: [phone]   Fax:  [phone]  Name: Stacie [name] MRN: [id number] Date of Birth: 1955/11/04

## 2019-06-21 NOTE — Therapy (Addendum)
[facility] [address] [place], [place], [address] Phone: [phone]   Fax:  [phone]  Occupational Therapy Evaluation  Patient Details  Name: Stacie [name] MRN: [id number] Date of Birth: 1955/02/11 Referring Provider (OT): Lauraine Rinne, PA-C   Encounter Date: 06/21/2019  OT End of Session - 06/21/19 1738    Visit Number  1    Number of Visits  17    Date for OT Re-Evaluation  08/20/19    Authorization Type  [employer] Medicare--1 copay for multiple visits on same day    Authorization - Visit Number  1    Authorization - Number of Visits  10    Progress Note Due on Visit  10    OT Start Time  0935    OT Stop Time  1015    OT Time Calculation (min)  40 min    Activity Tolerance  Patient tolerated treatment well    Behavior During Therapy  Restless;Impulsive       Past Medical History:  Diagnosis Date  . Colitis   . Hyperlipidemia   . Hypertension   . Stroke ([place]) 04/2012   unknown type  . Thyroid disease     History reviewed. No pertinent surgical history.  There were no vitals filed for this visit.  Subjective Assessment - 06/21/19 0941    Subjective   I've been doing the exercises they gave me    Pertinent History  CVA  05/04/19        PMH:  previous CVA approx 4 years ago affecting R side, hypertension, hyperlipidemia, tobacco abuse, marijuana abuse, ETOH abuse, depression, occasional urinary incontinence    Limitations  improved LUE strength    Currently in Pain?  No/denies        [facility] OT Assessment - 06/21/19 0001      Assessment   Medical Diagnosis  CVA    Referring Provider (OT)  Lauraine Rinne, PA-C    Onset Date/Surgical Date  05/04/19   symptoms 3/30, ED 05/04/19   Hand Dominance  Right    Prior Therapy  CIR      Precautions   Precautions  Fall      Balance Screen   Has the patient fallen in the past 6 months  No   except with CVA     Home  Environment   Family/patient expects to be discharged to:  Private residence    Lives With  --   lives with ex-husband     Prior Function   Level of Independence  Independent    Vocation  On disability   since first CVA   Vocation Requirements  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    Leisure  cook and work in Investment [employer], operational garden       ADL   Eating/Feeding  Independent    Grooming  Independent    Chiropodist - Astronomer -  Health and safety inspector for transportation    Prior Level of Function Light Housekeeping  independent    Nurse, adult  Maintains house alone or with occasional assistance   incr time   Meal Prep  Plans, prepares and serves adequate meals independently   someone in the home   Prior Level of Function Community Mobility  independent    [place] on family or friends for transportation    Medication Management  Is responsible for taking medication in correct dosages at correct time    Financial Management  --   auto payment online     Mobility   Mobility Status  Independent    Mobility Status Comments  Ambulates with wide gait and shorter step size, limited arm swing      Written Expression   Dominant Hand  Right      Vision - History   Visual History  --   2 seperate glasses for distance and near vision   Additional Comments  now [facility] from CVA per pt, just went to eye doctor and received updated glasses after CVA      Cognition   Overall Cognitive Status  Impaired/Different from baseline    Area of Impairment  Attention;Safety/judgement    Attention Comments  pt appeared interally distracted and restless with  almost continue RUE movement (rubbing/scratching), difficulty with use of LUE (tossing scarf) while ambulating for simple divided attention between 2 physicial tasks    Safety/Judgement  Decreased awareness of safety   impulsivity noted at times   Behaviors  Restless;Impulsive    Cognition Comments  pt reports incr processing time needed      Posture/Postural Control   Posture/Postural Control  Postural limitations    Posture Comments  decr core/trunk stability with UE muscle testing      Sensation   Additional Comments  denies changes      Coordination   9 Hole Peg Test  Right;Left    Right 9 Hole Peg Test  20.0    Left 9 Hole Peg Test  38.13   compensates with shoulder   Box and Blocks  R-59, L-47 blocks    Coordination  pt demo difficulty releasing objects from LUE, particularly with simple divided attention      ROM / Strength   AROM / PROM / Strength  AROM;Strength      AROM   Overall AROM   Deficits    Overall AROM Comments  LUE grossly WNL except decr shoulder end range (flex/abduction 140*)      Strength   Overall Strength  Deficits    Overall Strength Comments  L shoulder flex grossly 3+/5  (L shoulder grossly 4+/5), R biceps/triceps 4+/5.  Also noted decr core stability with UE strength testing      Hand Function   Right Hand Grip (lbs)  43.6    Left Hand Grip (lbs)  34.1                      OT Education - 06/21/19 1742    Education Details  OT eval results/POC    Person(s) Educated  Patient    Methods  Explanation    Comprehension  Verbalized understanding       OT Short Term Goals - 06/21/19 1828      OT SHORT TERM GOAL #1   Title  Pt will be independent with updated HEP for LUE strength and coordination.--check STGs 6/18.21    Time  4    Period  Weeks    Status  New      OT SHORT TERM  GOAL #2   Title  Pt will improve LUE functional reaching/coordination for ADLs as shown by improving score by at least 9 blocks.    Baseline  47  blocks    Time  4    Period  Weeks    Status  New      OT SHORT TERM GOAL #3   Title  Pt will demo at least 150* L shoulder flex for overhead reaching.    Baseline  140*    Time  4    Period  Weeks    Status  New      OT SHORT TERM GOAL #4   Title  Pt will improve L grip strength by at least 8lbs to assist with lifting/carrying objects.    Time  4    Period  Weeks    Status  New      OT SHORT TERM GOAL #5   Title  Pt will verbalize understanding of CVA risk factors and warning signs.    Time  4    Period  Weeks    Status  New        OT Long Term Goals - 06/21/19 1853      OT LONG TERM GOAL #1   Title  Pt will improve L hand coordination for ADLs as shown by improving time on 9-hole by at least 12sec.--check LTGs 08/20/19    Baseline  38.13sec    Time  8    Period  Weeks    Status  New      OT LONG TERM GOAL #2   Title  Pt will be able to retrieve replace 3lb object on overhead shelf with LUE with good control/safety x3.    Time  8    Period  Weeks    Status  New      OT LONG TERM GOAL #3   Title  Pt will be able to toss/catch ball in LUE without difficulty x10 for improved safety/incr ease with functional use.    Time  8    Period  Weeks    Status  New      OT LONG TERM GOAL #4   Title  Pt will demo ability to perform simple divided attention between at least 1 physical and 1 cognitive task with at least 90% accuarcy for improved safety.    Time  8    Period  Weeks    Status  New      OT LONG TERM GOAL #5   Title  Pt will be independent with HEP for incr core stability.    Time  8    Period  Weeks            Plan - 06/21/19 1800    Clinical Impression Statement  Pt is a 64 y.o. female s/p CVA (symptoms beginning 04/05/19, hospitalized 4/1-4/16/21).  Pt with PMH that includes:  hypertension, hyperlipidemia, tobacco abuse, marijuana abuse, ETOH abuse, depression, occasional urinary incontinence, hx of CVA approx 4 yrs ago affecting R side.  Pt reports  that she was indpendent, driving, gardening  prior to recent CVA (having no residual deficits from first CVA).  Pt presents today with L hemiparesis with decr coodination, strength, decr balance, and cognitive deficits.  Pt would benefit from occupational therapy to address these deficits for improved LUE functional use, incr ease/safety with ADLs/IADLs.    OT Occupational Profile and History  Detailed Assessment- Review of Records and additional review of physical, cognitive,  psychosocial history related to current functional performance    Occupational performance deficits (Please refer to evaluation for details):  IADL's;ADL's;Leisure    Body Structure / Function / Physical Skills  ADL;Coordination;Dexterity;IADL;FMC;UE functional use;Balance;Strength    Cognitive Skills  Attention;Thought;Problem Solve;Safety Awareness    Rehab Potential  Good    Clinical Decision Making  Several treatment options, min-mod task modification necessary    Comorbidities Affecting Occupational Performance:  May have comorbidities impacting occupational performance    Modification or Assistance to Complete Evaluation   Min-Moderate modification of tasks or assist with assess necessary to complete eval    OT Frequency  2x / week    OT Duration  8 weeks   +eval   OT Treatment/Interventions  Self-care/ADL training;Therapeutic exercise;Balance training;Manual Therapy;Neuromuscular education;Aquatic Therapy;Therapeutic activities;DME and/or AE instruction;Visual/perceptual remediation/compensation;Cognitive remediation/compensation;Patient/family education;Passive range of motion;Moist Heat    Plan  update HEP LUE strength and coordination    Consulted and Agree with Plan of Care  Patient       Patient will benefit from skilled therapeutic intervention in order to improve the following deficits and impairments:   Body Structure / Function / Physical Skills: ADL, Coordination, Dexterity, IADL, FMC, UE functional use,  Balance, Strength Cognitive Skills: Attention, Thought, Problem Solve, Safety Awareness     Visit Diagnosis: Hemiplegia and hemiparesis following cerebral infarction affecting left non-dominant side (HCC)  Other lack of coordination  Attention and concentration deficit  Unsteadiness on feet    Problem List Patient Active Problem List   Diagnosis Date Noted  . Aortic atherosclerosis ([place]) 06/20/2019  . Protein-calorie malnutrition, severe 05/16/2019  . 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 Underweight 05/05/2019  . Depression 05/05/2019  . Tobacco abuse 05/05/2019  . Marijuana use 05/05/2019  . Alcohol abuse 05/05/2019  . Chronic diastolic CHF (congestive heart failure) ([place]) 05/05/2019  . CVA (cerebral vascular accident) ([place]) 05/04/2019  . Chronic diarrhea 08/26/2018  . Loss of weight 08/26/2018  . PAD (peripheral artery disease) ([place]) 12/06/2017  . Claudication of right lower extremity ([place]) 12/06/2017  . Hyperlipidemia 02/28/2016  . Essential hypertension 02/21/2016  . 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 Hearing loss 02/21/2016    [facility] 06/21/2019, 7:11 PM  [facility] [address] [place], [place], [address] Phone: [phone]   Fax:  [phone]  Name: Stacie [name] MRN: [id number] Date of Birth: 15-Sep-1955   Vianne Bulls, OTR/L [facility] [address] [place], [place]  [address] [phone] phone [phone] 06/21/19 7:11 PM

## 2019-06-21 NOTE — Addendum Note (Signed)
Addended by: Vianne Bulls D on: 06/21/2019 07:13 PM   Modules accepted: Orders

## 2019-06-22 NOTE — Addendum Note (Signed)
Addended by: Garald Balding B on: 06/22/2019 08:57 AM   Modules accepted: Orders

## 2019-06-22 NOTE — Therapy (Signed)
[name] [address] [place], [place], [address] Phone: [phone]   Fax:  [phone]  Speech Language Pathology Evaluation  Patient Details  Name: Stacie [name] MRN: [id number] Date of Birth: 09-Dec-1955 Referring Provider (SLP): Stacie [name], [place]   Encounter Date: 06/21/2019  End of Session - 06/21/19 1708    Visit Number  1    Number of Visits  17    Date for SLP Re-Evaluation  09/19/19    SLP Start Time  0848    SLP Stop Time   0930    SLP Time Calculation (min)  42 min    Activity Tolerance  Patient tolerated treatment well   appeared anxious at times      Past Medical History:  Diagnosis Date  . Colitis   . Hyperlipidemia   . Hypertension   . Stroke ([place]) 04/2012   unknown type  . Thyroid disease     History reviewed. No pertinent surgical history.  There were no vitals filed for this visit.  Subjective Assessment - 06/21/19 0854    Subjective  "She (SLP on CIR) gave me hints about what to do - speak louder, pronounce more." Pt relates that she "is careful" when she drinks- reports coughing with liquids ~once/day.    Currently in Pain?  No/denies         SLP Evaluation [facility] - 06/21/19 [id number]      SLP Visit Information   SLP Received On  06/21/19    Referring Provider (SLP)  Stacie Mainland, PA    Onset Date  04 May 2019    Medical Diagnosis  CVA      Subjective   Subjective  Pt initially reports difficulty with liquids and upon furhter consultation with SLP, dysarthria. Does not endorse any differences cognitively.     Patient/Family Stated Goal  "Get better - not cough with my drinks."      General Information   HPI  Pt with previous CVA with resulting minor memory issues. PT reports she was "always making lists and crossing it off." CVA on 04 May 2019, with sx of lt facial droop with dysarthria and weakness in both legs. Pt was on CIR for mild cognitive impairments,  dysphagia, and dysarthria.       Prior Functional Status   Cognitive/Linguistic Baseline  Baseline deficits      Cognition   Overall Cognitive Status  Impaired/Different from baseline    Area of Impairment  Attention;Safety/judgement;Awareness;Problem solving    Current Attention Level  Sustained    Attention Comments  decr'd selective attention noted. likely some level of internal distraction. pt moderately impulsive. pt reports baseline anxiety.     Safety/Judgement  Decreased awareness of safety;Decreased awareness of deficits    Safety and Judgement Comments  affected by pt's level of impulsivity    Awareness Comments  pt with splinter skills with intellectual awareness. Initially denied any cognitive/"thinking" deficits but then when she noted her performance was affected on testing she endorsed "maybe" to having attention deficits. Emergent awareness with eval tasks was not observed.     Problem Solving  Requires verbal cues    Problem Solving Comments  Pt unable to make adequate correction in a simple visual sequencing task (trail making) on the CLQT    Behaviors  Restless;Impulsive   anxious behavior - pt states some of this is baseline     Auditory Comprehension   Overall Auditory Comprehension  Appears within functional limits  for tasks assessed      Verbal Expression   Overall Verbal Expression  Appears within functional limits for tasks assessed      Oral Motor/Sensory Function   Overall Oral Motor/Sensory Function  Impaired    Labial ROM  Reduced left   slight   Labial Symmetry  Within Functional Limits    Labial Strength  Reduced Left    Labial Coordination  Reduced    Lingual Symmetry  Abnormal symmetry left   slight   Lingual Strength  Reduced Left    Lingual Coordination  Reduced   with AMR labial margins     Motor Speech   Overall Motor Speech  Impaired    Respiration  Within functional limits    Level of Impairment  Conversation    Phonation  Normal     Resonance  Hypernasality   intermittently mild/minor   Articulation  Impaired    Level of Impairment  Sentence    Intelligibility  Intelligible    Word  --   100   Phrase  --   100%   Sentence  --   100%   Conversation  75-100% accurate   near-100%   Motor Planning  Witnin functional limits    Effective Techniques  Slow rate;Over-articulate   suspect difficult to improve due to cognition                     SLP Education - 06/21/19 1707    Education Details  eval results thus far, possible foci of therapy    Person(s) Educated  Patient    Methods  Explanation    Comprehension  Verbalized understanding;Need further instruction       SLP Short Term Goals - 06/21/19 1715      SLP SHORT TERM GOAL #1   Title  pt will demo safe swallow strategies with rare min A and visual support x 3 sessions    Time  4    Period  Weeks   or 9 total visits - for STGs 1-3   Status  New      SLP SHORT TERM GOAL #2   Title  pt will demo selective attention adequate for working on simple tasks for 15 minutes in min-mod noisy environement x2 sessions    Time  4    Period  Weeks    Status  New      SLP SHORT TERM GOAL #3   Title  pt will demo intellectual awareness by telling SLP 3 non-physical deficits in 3 sessions    Time  4    Period  Weeks    Status  New      SLP SHORT TERM GOAL #4   Title  pt will complete simple linguistic-based detailed tasks 90% success given self correction, in 3 sessions    Time  6    Period  Weeks   or 13 total visits   Status  New      SLP SHORT TERM GOAL #5   Title  pt will demo problem solving resulting in 80% success in simple linguistic based tasks over three sesssions    Time  6    Period  Weeks   or 13 total visits   Status  New      Additional Short Term Goals   Additional Short Term Goals  Yes      SLP SHORT TERM GOAL #6   Title  pt will demo emergent awareness  90% of the time in linguistic-based tasks with occasional min cues  in 3 sessions    Time  6    Period  Weeks   or 13 total visits   Status  New       SLP Long Term Goals - 06/21/19 1728      SLP LONG TERM GOAL #1   Title  Pt will demonstrate safe swallow strategies with modified independence over 3 sessions    Time  9    Period  Weeks   or 19 total sessions, for all 9 week LTGs   Status  New      SLP LONG TERM GOAL #2   Title  Pt will demo WNL alternating attention  between 2 mod complex linguistic based tasks x3 sessions    Time  9    Status  New      SLP LONG TERM GOAL #3   Title  Pt will demo WFL divided attention  between 2 simple linguistic based tasks x2 sessions    Time  12    Period  Weeks   or 25 total sessions, for all 12 week LTGs   Status  New      SLP LONG TERM GOAL #4   Title  Pt will demo WNL emergent awareness in linguistic based tasks with modified independence (self-correction) in 3 sessions    Time  9    Period  Weeks    Status  New      SLP LONG TERM GOAL #5   Title  Pt will demo functional anticipatory awareness in linguistic based tasks with modified independence in 3 sessions    Time  12    Period  Weeks    Status  New      Additional Long Term Goals   Additional Long Term Goals  Yes      SLP LONG TERM GOAL #6   Title  pt will complete detailed linguistic-based tasks with 100% success with modified independence    Time  12    Period  Weeks    Status  New       Plan - 06/21/19 1709    Clinical Impression Statement  Pt presents with deficits in cognitive communication such as decr'd attention, problem solving, basic awareness. Pt level of impulsivity complicates/worsens her deficits. Formal testing was initiated today but was not completed - to be completed in next session. Pt also complains of coughing with liquids "about once a day" however pt awareness is affected so this could actually occur more frequently. Pt was independent prior to CVA (baseline mild memory deficits after previous CVA well compensated  for), and now is living with daughter for supervision and assistance with IADLs/ADLs. Pt exhibits mild dysarthria due to oral motor weakness. She would benefit from skilled ST focusing on cognitive communcation defiicts, dysphagia compensations and possible exercises, and incr'd precision of speech articulation.    Speech Therapy Frequency  2x / week    Duration  --   12 weeks, or 25 visits   Treatment/Interventions  Aspiration precaution training;Pharyngeal strengthening exercises;Diet toleration management by SLP;Trials of upgraded texture/liquids;Cueing hierarchy;Cognitive reorganization;Internal/external aids;Patient/family education;Compensatory strategies;SLP instruction and feedback;Functional tasks;Oral motor exercises;Environmental controls    Potential to Achieve Goals  Good    Potential Considerations  Severity of impairments    Consulted and Agree with Plan of Care  Patient       Patient will benefit from skilled therapeutic intervention in order to improve the following  deficits and impairments:   Cognitive communication deficit  Dysarthria and anarthria  Dysphagia, unspecified type    Problem List Patient Active Problem List   Diagnosis Date Noted  . Aortic atherosclerosis ([place]) 06/20/2019  . Protein-calorie malnutrition, severe 05/16/2019  . 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 Underweight 05/05/2019  . Depression 05/05/2019  . Tobacco abuse 05/05/2019  . Marijuana use 05/05/2019  . Alcohol abuse 05/05/2019  . Chronic diastolic CHF (congestive heart failure) ([place]) 05/05/2019  . CVA (cerebral vascular accident) ([place]) 05/04/2019  . Chronic diarrhea 08/26/2018  . Loss of weight 08/26/2018  . PAD (peripheral artery disease) ([place]) 12/06/2017  . Claudication of right lower extremity ([place]) 12/06/2017  . Hyperlipidemia 02/28/2016  . Essential hypertension 02/21/2016  . 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 Hearing loss  02/21/2016    [facility] ,MS, CCC-SLP  06/22/2019, 8:55 AM  [facility] [address] [place], [place], [address] Phone: [phone]   Fax:  [phone]  Name: Stacie [name] MRN: [id number] Date of Birth: Nov 01, 1955

## 2019-06-27 ENCOUNTER — Other Ambulatory Visit: Payer: Self-pay

## 2019-06-27 ENCOUNTER — Ambulatory Visit: Payer: Medicare Other | Admitting: Speech Pathology

## 2019-06-27 DIAGNOSIS — R2681 Unsteadiness on feet: Secondary | ICD-10-CM | POA: Diagnosis not present

## 2019-06-27 DIAGNOSIS — R471 Dysarthria and anarthria: Secondary | ICD-10-CM

## 2019-06-27 DIAGNOSIS — R278 Other lack of coordination: Secondary | ICD-10-CM | POA: Diagnosis not present

## 2019-06-27 DIAGNOSIS — I69354 Hemiplegia and hemiparesis following cerebral infarction affecting left non-dominant side: Secondary | ICD-10-CM | POA: Diagnosis not present

## 2019-06-27 DIAGNOSIS — R41841 Cognitive communication deficit: Secondary | ICD-10-CM | POA: Diagnosis not present

## 2019-06-27 DIAGNOSIS — I639 Cerebral infarction, unspecified: Secondary | ICD-10-CM

## 2019-06-27 DIAGNOSIS — R1310 Dysphagia, unspecified: Secondary | ICD-10-CM

## 2019-06-27 NOTE — Therapy (Signed)
[facility] [address] [place], [place], [address] Phone: [phone]   Fax:  [phone]  Speech Language Pathology Treatment  Patient Details  Name: Stacie [name] MRN: [id number] Date of Birth: 1955/07/11 Referring Provider (SLP): Stann Mainland, [place]   Encounter Date: 06/27/2019  End of Session - 06/27/19 1727    Visit Number  2    Number of Visits  17    Date for SLP Re-Evaluation  09/19/19    SLP Start Time  1448    SLP Stop Time   1530    SLP Time Calculation (min)  42 min    Activity Tolerance  Patient tolerated treatment well       Past Medical History:  Diagnosis Date  . Colitis   . Hyperlipidemia   . Hypertension   . Stroke ([place]) 04/2012   unknown type  . Thyroid disease     No past surgical history on file.  There were no vitals filed for this visit.  Subjective Assessment - 06/27/19 1455    Subjective  "I think it's my speech," re: biggest difference with this CVA    Currently in Pain?  No/denies            ADULT SLP TREATMENT - 06/27/19 1457      General Information   Behavior/Cognition  Alert;Cooperative;Pleasant mood      Treatment Provided   Treatment provided  Dysphagia      Dysphagia Treatment   Temperature Spikes Noted  No    Respiratory Status  Room air    Treatment Methods  Skilled observation;Differential diagnosis;Therapeutic exercise;Compensation strategy training;Patient/caregiver education    Patient observed directly with PO's  Yes    Type of PO's observed  Dysphagia 3 (soft);Thin liquids    Feeding  Able to feed self    Liquids provided via  Straw    Oral Phase Signs & Symptoms  Anterior loss/spillage   mild loss with liquid   Pharyngeal Phase Signs & Symptoms  --   none noted; pt reports coughing with liquids 1-2 times /day   Type of cueing  Verbal    Amount of cueing  Moderate    Other treatment/comments  Patient initially denied trouble  swallowing, but then told SLP she is having trouble with liquids. Reports coughing 1-2 times per day. "Sometimes it's like it gets down there before I can swallow." SLP educated pt on airway protection and compensations including slow rate, small bites, single sips. Also provided education re: aspiration PNA signs (handout with precautions, s/sx aspiration PNA, and exercises in pt instructions). Mild anterior loss with liquids due to left facial weakness. Pt reports she does eat regular textures at home, but typically has softer foods with sauce/gravy; she denies difficulties with solids, and none noted today, although question pt awareness      Pain Assessment   Pain Assessment  No/denies pain      Assessment / Recommendations / Plan   Plan  Continue with current plan of care      Progression Toward Goals   Progression toward goals  Progressing toward goals       SLP Education - 06/27/19 1726    Education Details  signs of aspiration PNA, swallow precautions, oral strengthening HEP    Person(s) Educated  Patient    Methods  Explanation;Handout    Comprehension  Verbalized understanding       SLP Short Term Goals - 06/27/19 1457  SLP SHORT TERM GOAL #1   Title  pt will demo safe swallow strategies with rare min A and visual support x 3 sessions    Time  4    Period  Weeks   or 9 total visits - for STGs 1-3   Status  On-going      SLP SHORT TERM GOAL #2   Title  pt will demo selective attention adequate for working on simple tasks for 15 minutes in min-mod noisy environement x2 sessions    Time  4    Period  Weeks    Status  On-going      SLP SHORT TERM GOAL #3   Title  pt will demo intellectual awareness by telling SLP 3 non-physical deficits in 3 sessions    Time  4    Period  Weeks    Status  On-going      SLP SHORT TERM GOAL #4   Title  pt will complete simple linguistic-based detailed tasks 90% success given self correction, in 3 sessions    Time  6    Period   Weeks   or 13 total visits   Status  On-going      SLP SHORT TERM GOAL #5   Title  pt will demo problem solving resulting in 80% success in simple linguistic based tasks over three sesssions    Time  6    Period  Weeks   or 13 total visits   Status  On-going      SLP SHORT TERM GOAL #6   Title  pt will demo emergent awareness 90% of the time in linguistic-based tasks with occasional min cues in 3 sessions    Time  6    Period  Weeks   or 13 total visits   Status  On-going       SLP Long Term Goals - 06/27/19 1459      SLP LONG TERM GOAL #1   Title  Pt will demonstrate safe swallow strategies with modified independence over 3 sessions    Time  9    Period  Weeks   or 19 total sessions, for all 9 week LTGs   Status  On-going      SLP LONG TERM GOAL #2   Title  Pt will demo WNL alternating attention  between 2 mod complex linguistic based tasks x3 sessions    Time  9    Status  On-going      SLP LONG TERM GOAL #3   Title  Pt will demo WFL divided attention  between 2 simple linguistic based tasks x2 sessions    Time  12    Period  Weeks   or 25 total sessions, for all 12 week LTGs   Status  On-going      SLP LONG TERM GOAL #4   Title  Pt will demo WNL emergent awareness in linguistic based tasks with modified independence (self-correction) in 3 sessions    Time  9    Period  Weeks    Status  On-going      SLP LONG TERM GOAL #5   Title  Pt will demo functional anticipatory awareness in linguistic based tasks with modified independence in 3 sessions    Time  12    Period  Weeks    Status  On-going      SLP LONG TERM GOAL #6   Title  pt will complete detailed linguistic-based tasks with 100% success with  modified independence    Time  12    Period  Weeks    Status  On-going       Plan - 06/27/19 1727    Clinical Impression Statement  Pt presents with deficits in cognitive communication such as decr'd attention, problem solving, basic awareness. Pt level of  impulsivity complicates/worsens her deficits. Formal testing to be completed in next session. Pt also complains of coughing with liquids "about once a day" however pt awareness is affected so this could actually occur more frequently; no overt s/sx aspiration today however mild anterior loss noted. She is electing to consume regular/thin textures at home (d/c diet was dysphagia 3/thin), though per her report is primarily eating softer foods. Pt was independent prior to CVA (baseline mild memory deficits after previous CVA well compensated for), and now is living with daughter for supervision and assistance with IADLs/ADLs. Pt exhibits mild dysarthria due to oral motor weakness. She would benefit from skilled ST focusing on cognitive communcation defiicts, dysphagia compensations and possible exercises, and incr'd precision of speech articulation.Marland Kitchen    Speech Therapy Frequency  2x / week    Duration  --   12 weeks, or 25 visits   Treatment/Interventions  Aspiration precaution training;Pharyngeal strengthening exercises;Diet toleration management by SLP;Trials of upgraded texture/liquids;Cueing hierarchy;Cognitive reorganization;Internal/external aids;Patient/family education;Compensatory strategies;SLP instruction and feedback;Functional tasks;Oral motor exercises;Environmental controls    Potential to Achieve Goals  Good    Potential Considerations  Severity of impairments    Consulted and Agree with Plan of Care  Patient       Patient will benefit from skilled therapeutic intervention in order to improve the following deficits and impairments:   Dysphagia, unspecified type  Dysarthria and anarthria  Subcortical infarction [facility])    Problem List Patient Active Problem List   Diagnosis Date Noted  . Aortic atherosclerosis ([place]) 06/20/2019  . Protein-calorie malnutrition, severe 05/16/2019  . 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 Underweight 05/05/2019  . Depression 05/05/2019  . Tobacco  abuse 05/05/2019  . Marijuana use 05/05/2019  . Alcohol abuse 05/05/2019  . Chronic diastolic CHF (congestive heart failure) ([place]) 05/05/2019  . CVA (cerebral vascular accident) ([place]) 05/04/2019  . Chronic diarrhea 08/26/2018  . Loss of weight 08/26/2018  . PAD (peripheral artery disease) ([place]) 12/06/2017  . Claudication of right lower extremity ([place]) 12/06/2017  . Hyperlipidemia 02/28/2016  . Essential hypertension 02/21/2016  . 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 Hearing loss 02/21/2016   Deneise Lever, [place], [place] 06/27/2019, 5:30 PM  [facility] [address] [place], [place], [address] Phone: [phone]   Fax:  [phone]   Name: Stacie [name] MRN: [id number] Date of Birth: 07/12/1955

## 2019-06-27 NOTE — Patient Instructions (Signed)
Swallowing Precautions  -Eat and drink slowly. -Small bites and sips. -One bite at a time, one sip at a time.  - Avoid having a liquid and a solid in your mouth at the same time.   Signs of Aspiration Pneumonia   . Chest pain/tightness . Fever (can be low grade) . Cough  o With foul-smelling phlegm (sputum) o With sputum containing pus or blood o With greenish sputum . Fatigue  . Shortness of breath  . Wheezing   **IF YOU HAVE THESE SIGNS, CONTACT YOUR DOCTOR OR GO TO THE EMERGENCY DEPARTMENT OR URGENT CARE AS SOON AS POSSIBLE**     Do exercises 20x each, 3x a day - in the mirror, slow and big  1. Alternate pucker and smile - OOO-EEE  2. Open mouth big Ahh-OOO with mouth open big  3. Pucker and move your lips side to side  4. Puff up your cheeks with air BIG - swish air from side to side  5. Press lips together flat and pop them open  6. Pucker and kiss big  7. Press lips together tightly and hold for 5 seconds    SLOW LOUD OVER-ENNUNCIATE PAUSE   Say each 5x Slow and Big - make each sound distinct  [place]  BUTTERCUP  CATERPILLAR  BASEBALLL PLAYER  [place]  [place]  RED LEATHER YELLOW LEATHER  UNIQUE [place]  THREE FREE THROWS  FLASH MESSAGE  CINNAMON LINOLEUM ALUMINUM  SLOW AND BIG - EXAGGERATE YOUR MOUTH, MAKE EACH CONSONANT

## 2019-06-30 ENCOUNTER — Ambulatory Visit: Payer: Medicare Other

## 2019-07-04 ENCOUNTER — Other Ambulatory Visit: Payer: Self-pay

## 2019-07-04 ENCOUNTER — Ambulatory Visit: Payer: Medicare Other | Attending: Physician Assistant

## 2019-07-04 DIAGNOSIS — R471 Dysarthria and anarthria: Secondary | ICD-10-CM | POA: Diagnosis not present

## 2019-07-04 DIAGNOSIS — R41841 Cognitive communication deficit: Secondary | ICD-10-CM | POA: Diagnosis not present

## 2019-07-04 DIAGNOSIS — R1310 Dysphagia, unspecified: Secondary | ICD-10-CM | POA: Diagnosis not present

## 2019-07-05 ENCOUNTER — Encounter: Payer: Self-pay | Admitting: Adult Health

## 2019-07-05 ENCOUNTER — Ambulatory Visit: Payer: Medicare Other | Admitting: Adult Health

## 2019-07-05 VITALS — BP 121/69 | HR 70 | Ht 64.0 in | Wt 99.0 lb

## 2019-07-05 DIAGNOSIS — I639 Cerebral infarction, unspecified: Secondary | ICD-10-CM | POA: Diagnosis not present

## 2019-07-05 DIAGNOSIS — I10 Essential (primary) hypertension: Secondary | ICD-10-CM

## 2019-07-05 DIAGNOSIS — I69319 Unspecified symptoms and signs involving cognitive functions following cerebral infarction: Secondary | ICD-10-CM | POA: Diagnosis not present

## 2019-07-05 DIAGNOSIS — E782 Mixed hyperlipidemia: Secondary | ICD-10-CM

## 2019-07-05 NOTE — Patient Instructions (Signed)
Continue aspirin 81 mg daily  and simvastatin  for secondary stroke prevention  Continue to follow up with PCP regarding cholesterol and blood pressure management   Continue to work with speech therapy for ongoing hopeful improvement  ' Continue to monitor blood pressure at home  Maintain strict control of hypertension with blood pressure goal below 130/90, diabetes with hemoglobin A1c goal below 6.5% and cholesterol with LDL cholesterol (bad cholesterol) goal below 70 mg/dL. I also advised the patient to eat a healthy diet with plenty of whole grains, cereals, fruits and vegetables, exercise regularly and maintain ideal body weight.  Followup in the future with me in 4 months or call earlier if needed       Thank you for coming to see [place] at [facility]. I hope we have been able to provide you high quality care today.  You may receive a patient satisfaction survey over the next few weeks. We would appreciate your feedback and comments so that we may continue to improve ourselves and the health of our patients.

## 2019-07-05 NOTE — Patient Instructions (Signed)
  Talk with your PCP if you feel your anxiety is worse after your stroke.

## 2019-07-05 NOTE — Progress Notes (Signed)
[facility] [address] [place]. [place] [address] [phone]       HOSPITAL FOLLOW UP NOTE  [name] Date of Birth:  05/09/1955 Medical Record Number:  [id number]   Reason for Referral:  hospital stroke follow up    SUBJECTIVE:   CHIEF COMPLAINT:  Chief Complaint  Patient presents with  . Follow-up    hospital fu, alone, rm 9, pt states she is doing well, no concerns     HPI:   [name] is a 64 y.o. female with history of HTN, HLD, tobacco use  who presented on 05/04/2019 with facial droop, dysarthria and unsteady gait.   Stroke work-up revealed multiple small subcortical infarcts within R PLIC infarct, L caudate head and white matter infarct and right lateral ventricle infarct secondary to small vessel disease source.  MRI also showed scattered old white matter and right basal ganglia infarcts.  CTA head/neck negative E LVO but did show arthrosclerosis.  Recommended DAPT for 3 weeks and aspirin alone.  History of HTN elevated on arrival at 201/98 occasionally elevated to 190s and recommended long-term BP goal normotensive range.  LDL 125 and initiated atorvastatin 40 mg daily.  Other stroke risk factors include current tobacco use, EtOH use, UDS positive for THC, prior stroke on imaging and family history of stroke.  Other active problems include colitis, thyroid disease and depression.  Residual deficits of dysarthria, left hemiparesis and gait ataxia.  Evaluated by therapies and recommended discharge to CIR for ongoing therapy needs.  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 Small vessel disease. Atrophy.   MRI  R PLIC infarct. Punctate L caudate head white matter infarct. R lateral ventricle infarct. Scattered old white matter and R basal ganglia infarcts.   CTA head & neck no ELVO. Head and neck  atherosclerosis.  2D Echo EF 60-65%. No source of embolus   LDL 125 - placed on zocor 40mg  daily  HgbA1c 5.5  Lovenox 40 mg sq daily for VTE prophylaxis  No antithrombotic prior to admission, now on aspirin 325 mg daily. Decrease aspirin to 81 and add plavix 75 mg daily. Continue DAPT x 3 weeks then aspirin alone. Orders adjusted.  Therapy recommendations:  CIR  Disposition:  CIR  Today, 07/05/2019, [name] is being seen for hospital follow-up.  She was discharged home with recommendation of outpatient therapy from CIR on 05/19/2019.  Continues to work with speech therapy for cognitive communication deficit, dysarthria and dysphagia. She does endorse occasional left-sided weakness but overall improvement. Completed 3 weeks DAPT and continues on aspirin alone with bleeding or bruising. Continues on zocor without myalgias. Blood pressure today 121/69. She endorses complete cessation of tobacco use but continues to use THC daily.  She reports medical use of THC while in [place] approximately 5 years ago and has continued since that time.  No intention of quitting at this time.  No further concerns.       ROS:   14 system review of systems performed and negative with exception of speech impairment, cognitive impairment and gait impairment  PMH:  Past Medical History:  Diagnosis Date  . Colitis   . Hyperlipidemia   . Hypertension   . Stroke ([place]) 04/2012   unknown type  . Thyroid disease     PSH: No past surgical history on file.  Social History:  Social History   Socioeconomic History  . Marital status: Divorced    Spouse name: Not on file  . Number of children: Not on file  . Years of education: Not on file  . Highest education level: Not on file  Occupational History  . Not on file  Tobacco Use  . Smoking status: Current Every Day Smoker    Packs/day: 0.25    Years: 10.00    Pack years: 2.50  . Smokeless tobacco: Never Used  Substance and Sexual Activity  .  Alcohol use: Yes    Alcohol/week: 14.0 standard drinks    Types: 14 Cans of beer per week    Comment: 8 beers a week  . Drug use: Yes    Frequency: 7.0 times per week    Types: Marijuana    Comment: to help with colitis- multiple times a day  . Sexual activity: Not Currently  Other Topics Concern  . Not on file  Social History Narrative  . Not on file   Social Determinants of Health   Financial Resource Strain:   . Difficulty of Paying Living Expenses:   Food Insecurity:   . Worried About Charity fundraiser in the Last Year:   . Arboriculturist in the Last Year:   Transportation Needs:   . Film/video editor (Medical):   Marland Kitchen Lack of Transportation (Non-Medical):   Physical Activity:   . Days of Exercise per Week:   . Minutes of Exercise per Session:   Stress:   . Feeling of Stress :   Social Connections:   . Frequency of Communication with Friends and Family:   . Frequency of Social Gatherings with Friends and Family:   . Attends Religious Services:   . Active Member of Clubs or Organizations:   . Attends Archivist Meetings:   Marland Kitchen Marital Status:   Intimate Partner Violence:   . Fear of Current or Ex-Partner:   . Emotionally Abused:   Marland Kitchen Physically Abused:   . Sexually Abused:     Family History:  Family History  Problem Relation Age of Onset  . Stroke Mother 84       embolic  . Heart disease Father 36       CAd with CABG, no MI  . Heart disease Sister 52       CAD  . Heart disease Paternal Uncle   . Heart disease Maternal Grandmother   . Heart disease Paternal Grandfather   . Colon cancer Neg Hx   . Esophageal cancer Neg Hx   . Stomach cancer Neg Hx     Medications:   Current Outpatient Medications on File Prior to Visit  Medication Sig Dispense Refill  . acetaminophen (TYLENOL) 325 MG tablet Take 2 tablets (650 mg total) by mouth every 4 (four) hours as needed for mild pain (or temp > 37.5 C (99.5 F)).    Marland Kitchen amLODipine (NORVASC) 10 MG tablet  Take 1 tablet (10 mg total) by mouth daily. 90 tablet 1  . aspirin EC 81 MG EC tablet Take 1 tablet (81 mg total) by mouth daily.    Marland Kitchen doxylamine, Sleep, (UNISOM) 25 MG tablet Take 25 mg by mouth at bedtime as needed for sleep.     Marland Kitchen escitalopram (LEXAPRO) 20 MG tablet TAKE 1 TABLET(20 MG) BY MOUTH DAILY 90 tablet 3  . folic acid (FOLVITE) 1 MG tablet Take 1 tablet (1 mg total) by mouth daily. 90 tablet 1  .  hydrALAZINE (APRESOLINE) 100 MG tablet Take 1 tablet (100 mg total) by mouth every 8 (eight) hours. 90 tablet 11  . isosorbide mononitrate (IMDUR) 30 MG 24 hr tablet TAKE 1 TABLET(30 MG) BY MOUTH DAILY 90 tablet 3  . Multiple Vitamin (MULTIVITAMIN WITH MINERALS) TABS tablet Take 1 tablet by mouth daily.    . simvastatin (ZOCOR) 40 MG tablet Take 1 tablet (40 mg total) by mouth daily at 6 PM. 90 tablet 3   No current facility-administered medications on file prior to visit.    Allergies:   Allergies  Allergen Reactions  . Trazodone And Nefazodone Nausea And Vomiting  . Wellbutrin [Bupropion] Nausea And Vomiting      OBJECTIVE:  Physical Exam  Vitals:   07/05/19 1444  BP: 121/69  Pulse: 70  Weight: 99 lb (44.9 kg)  Height: 5\' 4"  (1.626 m)   Body mass index is 16.99 kg/m. No exam data present   General: frail pleasant middle aged Caucasian female, seated, in no evident distress Head: head normocephalic and atraumatic.   Neck: supple with no carotid or supraclavicular bruits Cardiovascular: regular rate and rhythm, no murmurs Musculoskeletal: no deformity Skin:  no rash/petichiae Vascular:  Normal pulses all extremities   Neurologic Exam Mental Status: Awake and fully alert. Mild dysarthria.  No evidence of aphasia.  Oriented to place and time. Recent and remote memory intact during visit. Attention span, concentration and fund of knowledge appropriate during visit. Mood and affect appropriate.  Cranial Nerves: Fundoscopic exam reveals sharp disc margins. Pupils equal,  briskly reactive to light. Extraocular movements full without nystagmus. Visual fields full to confrontation. Hearing intact. Facial sensation intact.  Left lower facial weakness Motor: Normal bulk and tone. Normal strength in all tested extremity muscles except slight decrease left hand dexterity. Sensory.: intact to touch , pinprick , position and vibratory sensation.  Coordination: Rapid alternating movements normal in all extremities except slight decrease left hand dexterity. Finger-to-nose and heel-to-shin performed accurately bilaterally. Gait and Station: Arises from chair without difficulty. Stance is normal. Gait demonstrates normal stride length and balance without use of assistive device Reflexes: 1+ and symmetric. Toes downgoing.     NIHSS  1 Modified Rankin  3     ASSESSMENT/PLAN: Stacie [name] is a 64 y.o. year old female presented with facial droop, dysarthria and unsteady gait on 05/04/2019 with stroke work-up revealing multiple small subcortical infarcts within R PLIC, L caudate and WM, and right lateral ventricle infarct secondary to small vessel disease. Vascular risk factors include HTN, HLD, tobacco use, THC use and prior stroke.  Residual deficits of mild dysarthria, decreased LUE dexterity and cognitive impairment    1. Multiple strokes 2/2 small vessel disease:  -Residual deficits, poststroke: Continue to work with outpatient therapy for hopeful ongoing improvement -Continue aspirin 81 mg daily  and simvastatin 40 mg daily for secondary stroke prevention. Maintain strict control of hypertension with blood pressure goal below 130/90, diabetes with hemoglobin A1c goal below 6.5% and cholesterol with LDL cholesterol (bad cholesterol) goal below 70 mg/dL.  I also advised the patient to eat a healthy diet with plenty of whole grains, cereals, fruits and vegetables, exercise regularly with at least 30 minutes of continuous activity daily and maintain ideal body  weight. 2. HTN: Stable at today's visit.  Continue to follow with PCP for monitoring and management 3. HLD: Continuation of simvastatin 40 mg daily and continue to follow with PCP for prescribing, monitoring and management 4. Tobacco and THC use: Congratulated  on complete tobacco cessation and highly encouraged THC cessation but she has no interest in discontinuing at this time.  Advised to increase cardiovascular risk in which she understands.    Follow up in 4 months or call earlier if needed   I spent 45 minutes of face-to-face and non-face-to-face time with patient.  This included previsit chart review, lab review, study review, order entry, electronic health record documentation, patient education regarding recent stroke, residual deficits, importance of managing stroke risk factors and answered all questions to patient satisfaction     Frann Rider, [facility]  [facility] [address] [place], [place] [address]  Phone [phone] Fax [phone] Note: This document was prepared with digital dictation and possible smart phrase technology. Any transcriptional errors that result from this process are unintentional.

## 2019-07-05 NOTE — Therapy (Signed)
[facility] [address] [place], [place], [address] Phone: [phone]   Fax:  [phone]  Speech Language Pathology Treatment  Patient Details  Name: Stacie [name] MRN: [id number] Date of Birth: 11/18/1955 Referring Provider (SLP): Stann Mainland, [place]   Encounter Date: 07/04/2019  End of Session - 07/05/19 0834    Visit Number  3    Number of Visits  17    Date for SLP Re-Evaluation  09/19/19    SLP Start Time  1319    SLP Stop Time   1400    SLP Time Calculation (min)  41 min    Activity Tolerance  Patient tolerated treatment well       Past Medical History:  Diagnosis Date  . Colitis   . Hyperlipidemia   . Hypertension   . Stroke ([place]) 04/2012   unknown type  . Thyroid disease     History reviewed. No pertinent surgical history.  There were no vitals filed for this visit.  Subjective Assessment - 07/04/19 1324    Subjective  "Did you go anywhere the long weekend?"    Currently in Pain?  No/denies            ADULT SLP TREATMENT - 07/05/19 0001      General Information   Behavior/Cognition  Alert;Cooperative;Pleasant mood      Treatment Provided   Treatment provided  Cognitive-Linquistic;Dysphagia      Dysphagia Treatment   Respiratory Status  Room air    Treatment Methods  Patient/caregiver education;Compensation strategy training    Other treatment/comments  SLP reviewed pt's swallow precautions introduced last session - pt only recalled "small bites" so SLP reviewed all of the precautions and pt return verbalized 7 minutes later.       Cognitive-Linquistic Treatment   Treatment focused on  Cognition    Skilled Treatment  Pt told SLP she gardened so SLP brought reasoning task for gardening - pt noted some anxious behaviors such as heavily bouncing her leg, verbal output like "Is that right - is that right?" without waiting for SLP to respond. Pt with decr'd alternating  attention with self correction occasionally when moving from number to number. SLP asked pt if she was prone to anxiety premorbidly and she reported she was but it seems more pronounced now. SLP encouraged pt to talk with her PCP about this. With structured sequencing task  pt req'd occasional mod A for attention to detail. Pt's anxious behavior/impulsivity negatively affected pt performance.       Assessment / Recommendations / Plan   Plan  Continue with current plan of care      Progression Toward Goals   Progression toward goals  Progressing toward goals       SLP Education - 07/05/19 0833    Education Details  informing PCP if anxiety is more pronounced after CVA    Person(s) Educated  Patient    Methods  Explanation    Comprehension  Verbalized understanding       SLP Short Term Goals - 07/04/19 1326      SLP SHORT TERM GOAL #1   Title  pt will demo safe swallow strategies with rare min A and visual support x 3 sessions    Time  3    Period  Weeks   or 9 total visits - for STGs 1-3   Status  On-going      SLP SHORT TERM GOAL #2   Title  pt  will demo selective attention adequate for working on simple tasks for 15 minutes in min-mod noisy environement x2 sessions    Time  3    Period  Weeks    Status  On-going      SLP SHORT TERM GOAL #3   Title  pt will demo intellectual awareness by telling SLP 3 non-physical deficits in 3 sessions    Time  3    Period  Weeks    Status  On-going      SLP SHORT TERM GOAL #4   Title  pt will complete simple linguistic-based detailed tasks 90% success given self correction, in 3 sessions    Time  5    Period  Weeks   or 13 total visits   Status  On-going      SLP SHORT TERM GOAL #5   Title  pt will demo problem solving resulting in 80% success in simple linguistic based tasks over three sesssions    Time  5    Period  Weeks   or 13 total visits   Status  On-going      SLP SHORT TERM GOAL #6   Title  pt will demo emergent  awareness 90% of the time in linguistic-based tasks with occasional min cues in 3 sessions    Time  5    Period  Weeks   or 13 total visits   Status  On-going       SLP Long Term Goals - 07/04/19 1327      SLP LONG TERM GOAL #1   Title  Pt will demonstrate safe swallow strategies with modified independence over 3 sessions    Time  8    Period  Weeks   or 19 total sessions, for all 9 week LTGs   Status  On-going      SLP LONG TERM GOAL #2   Title  Pt will demo WNL alternating attention  between 2 mod complex linguistic based tasks x3 sessions    Time  8    Status  On-going      SLP LONG TERM GOAL #3   Title  Pt will demo WFL divided attention  between 2 simple linguistic based tasks x2 sessions    Time  11    Period  Weeks   or 25 total sessions, for all 12 week LTGs   Status  On-going      SLP LONG TERM GOAL #4   Title  Pt will demo WNL emergent awareness in linguistic based tasks with modified independence (self-correction) in 3 sessions    Time  8    Period  Weeks    Status  On-going      SLP LONG TERM GOAL #5   Title  Pt will demo functional anticipatory awareness in linguistic based tasks with modified independence in 3 sessions    Time  11    Period  Weeks    Status  On-going      SLP LONG TERM GOAL #6   Title  pt will complete detailed linguistic-based tasks with 100% success with modified independence    Time  11    Period  Weeks    Status  On-going       Plan - 07/05/19 0834    Clinical Impression Statement  Pt presents with deficits in cognitive communication such as decr'd attention, problem solving, basic awareness. Pt level of impulsivity complicates/worsens her deficits. She cont to elect to consume regular/thin textures  at home (d/c diet was dysphagia 3/thin), though per her report is primarily eating softer foods. Pt was independent prior to CVA (baseline mild memory deficits after previous CVA well compensated for), and now is living with daughter for  supervision and assistance with IADLs/ADLs. Pt exhibits mild dysarthria due to oral motor weakness. She would benefit from skilled ST focusing on cognitive communcation defiicts, dysphagia compensations and possible exercises, and incr'd precision of speech articulation.Marland Kitchen    Speech Therapy Frequency  2x / week    Duration  --   12 weeks, or 25 visits   Treatment/Interventions  Aspiration precaution training;Pharyngeal strengthening exercises;Diet toleration management by SLP;Trials of upgraded texture/liquids;Cueing hierarchy;Cognitive reorganization;Internal/external aids;Patient/family education;Compensatory strategies;SLP instruction and feedback;Functional tasks;Oral motor exercises;Environmental controls    Potential to Achieve Goals  Good    Potential Considerations  Severity of impairments    Consulted and Agree with Plan of Care  Patient       Patient will benefit from skilled therapeutic intervention in order to improve the following deficits and impairments:   Cognitive communication deficit  Dysphagia, unspecified type  Dysarthria and anarthria    Problem List Patient Active Problem List   Diagnosis Date Noted  . Aortic atherosclerosis ([place]) 06/20/2019  . Protein-calorie malnutrition, severe 05/16/2019  . 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[place] [facility]) 05/12/2019  . Underweight 05/05/2019  . Depression 05/05/2019  . Tobacco abuse 05/05/2019  . Marijuana use 05/05/2019  . Alcohol abuse 05/05/2019  . Chronic diastolic CHF (congestive heart failure) ([place]) 05/05/2019  . CVA (cerebral vascular accident) ([facility]) 05/04/2019  . Chronic diarrhea 08/26/2018  . Loss of weight 08/26/2018  . PAD (peripheral artery disease) ([facility]) 12/06/2017  . Claudication of right lower extremity ([place]) 12/06/2017  . Hyperlipidemia 02/28/2016  . Essential hypertension 02/21/2016  . 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 Hearing loss 02/21/2016    [facility] ,MS,  CCC-SLP  07/05/2019, 8:37 AM  [facility] [address] [place], [place], [address] Phone: [phone]   Fax:  [phone]   Name: Taaliah Krough MRN: [id number] Date of Birth: 07-Sep-1955

## 2019-07-06 NOTE — Progress Notes (Signed)
I agree with the above plan 

## 2019-07-07 ENCOUNTER — Other Ambulatory Visit: Payer: Self-pay | Admitting: Nurse Practitioner

## 2019-07-07 ENCOUNTER — Ambulatory Visit: Payer: Medicare Other

## 2019-07-07 DIAGNOSIS — I10 Essential (primary) hypertension: Secondary | ICD-10-CM

## 2019-07-10 ENCOUNTER — Ambulatory Visit: Payer: Medicare Other

## 2019-07-14 ENCOUNTER — Ambulatory Visit: Payer: Medicare Other

## 2019-07-18 ENCOUNTER — Ambulatory Visit: Payer: Medicare Other

## 2019-07-18 ENCOUNTER — Ambulatory Visit: Payer: Medicare Other | Admitting: Speech Pathology

## 2019-07-20 ENCOUNTER — Ambulatory Visit: Payer: Medicare Other

## 2019-07-20 ENCOUNTER — Ambulatory Visit: Payer: Medicare Other | Admitting: Speech Pathology

## 2019-07-25 ENCOUNTER — Ambulatory Visit: Payer: Medicare Other

## 2019-07-25 ENCOUNTER — Ambulatory Visit: Payer: Medicare Other | Admitting: Speech Pathology

## 2019-07-25 NOTE — Addendum Note (Signed)
Addended by: Kerrie Pleasure on: 07/25/2019 01:17 PM   Modules accepted: Orders

## 2019-07-27 ENCOUNTER — Ambulatory Visit: Payer: Medicare Other

## 2019-08-01 ENCOUNTER — Ambulatory Visit: Payer: Medicare Other

## 2019-08-01 ENCOUNTER — Telehealth: Payer: Self-pay | Admitting: Speech Pathology

## 2019-08-01 ENCOUNTER — Ambulatory Visit: Payer: Medicare Other | Admitting: Speech Pathology

## 2019-08-01 NOTE — Telephone Encounter (Signed)
SLP called patient to inform of No-Show policy after 2 ST no-shows on 07/25/19 and 08/01/19. Also has NS for PT on 6/22 and 6/24. Left message and informed of next appointments on 08/04/19. Informed patient if we do not hear from her and she misses another appointment, remaining appointments will be cancelled and additional referral required if she wishes to return for therapy.  Deneise Lever, MS, Actor

## 2019-08-04 ENCOUNTER — Ambulatory Visit: Payer: Medicare Other

## 2019-08-08 ENCOUNTER — Ambulatory Visit: Payer: Medicare Other | Admitting: Occupational Therapy

## 2019-08-08 ENCOUNTER — Ambulatory Visit: Payer: Medicare Other

## 2019-08-10 ENCOUNTER — Ambulatory Visit: Payer: Medicare Other

## 2019-08-10 ENCOUNTER — Encounter: Payer: Medicare Other | Admitting: Occupational Therapy

## 2019-08-15 ENCOUNTER — Encounter: Payer: Medicare Other | Admitting: Occupational Therapy

## 2019-08-15 ENCOUNTER — Ambulatory Visit: Payer: Medicare Other

## 2019-08-15 ENCOUNTER — Other Ambulatory Visit: Payer: Self-pay | Admitting: Physical Medicine and Rehabilitation

## 2019-08-15 DIAGNOSIS — I10 Essential (primary) hypertension: Secondary | ICD-10-CM

## 2019-08-16 ENCOUNTER — Other Ambulatory Visit: Payer: Self-pay

## 2019-08-16 DIAGNOSIS — I739 Peripheral vascular disease, unspecified: Secondary | ICD-10-CM

## 2019-08-17 ENCOUNTER — Encounter: Payer: Medicare Other | Admitting: Occupational Therapy

## 2019-08-17 ENCOUNTER — Encounter: Payer: Medicare Other | Admitting: Speech Pathology

## 2019-08-17 ENCOUNTER — Ambulatory Visit: Payer: Medicare Other

## 2019-08-21 ENCOUNTER — Encounter: Payer: Self-pay | Admitting: Occupational Therapy

## 2019-08-21 NOTE — Therapy (Signed)
[facility] [address] [place], [place], [address] Phone: [phone]   Fax:  [phone]  Patient Details  Name: Stacie [name] MRN: [id number] Date of Birth: 1956-02-01 Referring Provider:  No ref. provider found  Encounter Date: 08/21/2019   OCCUPATIONAL THERAPY DISCHARGE SUMMARY  Visits from Start of Care: 1 (eval)  Current functional level related to goals / functional outcomes: See eval as pt did not return after eval   Remaining deficits: See eval as pt did not return after eval   Education / Equipment: Not completed as pt did not return  Plan: Patient agrees to discharge.  Patient goals were not met. Patient is being discharged due to not returning since the last visit.  Pt did not return after eval.  Pt requested d/c as she is not able to attend regularly at this time due to family issue. ?????       [facility] 08/21/2019, 12:33 PM  [facility] [address] [place], [place], [address] Phone: [phone]   Fax:  [place], OTR/L [facility] [address] [place], [place]  [address] [phone] phone [phone] 08/21/19 12:33 PM

## 2019-08-22 ENCOUNTER — Encounter: Payer: Medicare Other | Admitting: Occupational Therapy

## 2019-08-22 ENCOUNTER — Encounter: Payer: Medicare Other | Admitting: Speech Pathology

## 2019-08-22 ENCOUNTER — Ambulatory Visit: Payer: Medicare Other

## 2019-08-23 ENCOUNTER — Other Ambulatory Visit: Payer: Self-pay

## 2019-08-23 ENCOUNTER — Ambulatory Visit (HOSPITAL_COMMUNITY)
Admission: RE | Admit: 2019-08-23 | Discharge: 2019-08-23 | Disposition: A | Discharge status: Home / Self Care | Payer: Medicare Other | Source: Ambulatory Visit | Attending: Vascular Surgery | Admitting: Vascular Surgery

## 2019-08-23 ENCOUNTER — Encounter: Payer: Self-pay | Admitting: Vascular Surgery

## 2019-08-23 ENCOUNTER — Ambulatory Visit (INDEPENDENT_AMBULATORY_CARE_PROVIDER_SITE_OTHER): Payer: Medicare Other | Admitting: Vascular Surgery

## 2019-08-23 VITALS — BP 164/81 | HR 65 | Temp 98.0°F | Resp 20 | Ht 64.0 in | Wt 99.0 lb

## 2019-08-23 DIAGNOSIS — I739 Peripheral vascular disease, unspecified: Secondary | ICD-10-CM | POA: Diagnosis not present

## 2019-08-23 NOTE — Progress Notes (Signed)
REASON FOR CONSULT:    Peripheral vascular disease.  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  Fortunately she quit smoking in April.  We have discussed the importance of a structured walking program.  We have also discussed nutrition and I have recommended a mostly plant-based diet.  I plan on seeing her back in 9 months with follow-up ABIs.  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  However currently given that her symptoms are stable I would not recommend an aggressive approach at this time.  We will start with conservative treatment.  She is on aspirin and is on a statin.  I will see her back in 9 months.  She knows to call sooner if she has problems.  Deitra Mayo, MD Office: [phone]   HPI:   Stacie [name] is a pleasant 64 y.o. female, who is referred with peripheral vascular disease.  I have reviewed the records from the referring office.  The patient was seen by [facility] primary care on 06/20/2019.  The patient had had a stroke 3 weeks ago and was undergoing continued physical therapy.  She has a history of essential hypertension, hyperlipidemia, and chronic depression.   On my history the patient developed the gradual onset of right calf claudication 1 year ago.  The symptoms are limited to her calf.  She denies thigh or hip claudication.  She has no symptoms in the left leg.  Her symptoms are brought on by ambulation and relieved with rest.  Over the last year the symptoms have gradually gotten worse.  She can walk half a block now before experiencing symptoms.  She had a stroke in April and quit smoking at that time.  She had smoked a half a  pack per day.  Her MRI of the brain during her admission for her stroke showed multifocal infarct.  There was no large vessel disease seen on the CT of her neck.  She was placed on aspirin, Plavix for 3 weeks, and a statin.  Her risk factors for peripheral vascular disease include hypertension, hypercholesterolemia, a family history of premature cardiovascular disease, and tobacco use.  Past Medical History:  Diagnosis Date  . Colitis   . Hyperlipidemia   . Hypertension   . Stroke ([facility]) 04/2012   unknown type  . Thyroid disease     Family History  Problem Relation Age of Onset  . Stroke Mother 34       embolic  . Heart disease Father 33       CAd with CABG, no MI  . Heart disease Sister 48       CAD  . Heart disease Paternal Uncle   . Heart disease Maternal Grandmother   . Heart disease Paternal Grandfather   . Colon cancer Neg Hx   . Esophageal cancer Neg Hx   . Stomach cancer Neg Hx     SOCIAL HISTORY: Social History   Socioeconomic History  . Marital status: Divorced    Spouse name: Not on file  . Number of children: Not on file  . Years of education: Not on file  . Highest education level: Not on file  Occupational History  . Not on file  Tobacco Use  . Smoking status: Former Smoker    Packs/day: 0.25    Years: 10.00    Pack years: 2.50    Quit date: 05/2019    Years since quitting: 0.3  . Smokeless tobacco: Never Used  Vaping Use  . Vaping Use: Never used  Substance and Sexual Activity  . Alcohol use: Yes    Alcohol/week: 14.0 standard drinks    Types: 14 Cans of beer per week    Comment: 8 beers a week  . Drug use: Yes    Frequency: 7.0 times per week    Types: Marijuana    Comment: to help with colitis- multiple times a day  . Sexual activity: Not Currently  Other Topics Concern  . Not on file  Social History Narrative  . Not on file   Social Determinants of Health   Financial Resource Strain:   . Difficulty of Paying Living Expenses:     Food Insecurity:   . Worried About Charity fundraiser in the Last Year:   . Arboriculturist in the Last Year:   Transportation Needs:   . Film/video editor (Medical):   Marland Kitchen Lack of Transportation (Non-Medical):   Physical Activity:   . Days of Exercise per Week:   . Minutes of Exercise per Session:   Stress:   . Feeling of Stress :   Social Connections:   . Frequency of Communication with Friends and Family:   . Frequency of Social Gatherings with Friends and Family:   . Attends Religious Services:   . Active Member of Clubs or Organizations:   . Attends Archivist Meetings:   Marland Kitchen Marital Status:   Intimate Partner Violence:   . Fear of Current or Ex-Partner:   . Emotionally Abused:   Marland Kitchen Physically Abused:   . Sexually Abused:     Allergies  Allergen Reactions  . Trazodone And Nefazodone Nausea And Vomiting  . Wellbutrin [Bupropion] Nausea And Vomiting    Current Outpatient Medications  Medication Sig Dispense Refill  . acetaminophen (TYLENOL) 325 MG tablet Take 2 tablets (650 mg total) by mouth every 4 (four) hours as needed for mild pain (or temp > 37.5 C (99.5 F)).    Marland Kitchen amLODipine (NORVASC) 10 MG tablet Take 1 tablet (10 mg total) by mouth daily. 90 tablet 1  . aspirin EC 81 MG EC tablet Take 1 tablet (81 mg total) by mouth daily.    Marland Kitchen doxylamine, Sleep, (UNISOM) 25 MG tablet Take 25 mg by mouth at bedtime as needed for sleep.     Marland Kitchen escitalopram (LEXAPRO) 20 MG tablet TAKE 1 TABLET(20 MG) BY MOUTH DAILY 90 tablet 3  . folic acid (FOLVITE) 1 MG tablet Take 1 tablet (1 mg total) by mouth daily. 90 tablet 1  . hydrALAZINE (APRESOLINE) 100 MG tablet Take 1 tablet (100 mg total) by mouth every 8 (eight) hours. 90 tablet 11  . isosorbide mononitrate (IMDUR) 30 MG 24 hr tablet TAKE 1 TABLET(30 MG) BY MOUTH DAILY 90 tablet 3  . Multiple Vitamin (MULTIVITAMIN WITH MINERALS) TABS tablet Take 1 tablet by mouth daily.    . simvastatin (ZOCOR) 40 MG tablet Take 1 tablet  (40 mg total) by mouth daily at 6 PM. 90 tablet 3   No current facility-administered medications for this visit.    REVIEW OF SYSTEMS:  [X]  denotes positive finding, [ ]  denotes negative finding Cardiac  Comments:  Chest pain or chest pressure:  Shortness of breath upon exertion:    Short of breath when lying flat:    Irregular heart rhythm:        Vascular    Pain in calf, thigh, or hip brought on by ambulation: x   Pain in feet at night that wakes you up from your sleep:     Blood clot in your veins:    Leg swelling:         Pulmonary    Oxygen at home:    Productive cough:     Wheezing:         Neurologic    Sudden weakness in arms or legs:     Sudden numbness in arms or legs:     Sudden onset of difficulty speaking or slurred speech:    Temporary loss of vision in one eye:     Problems with dizziness:         Gastrointestinal    Blood in stool:     Vomited blood:         Genitourinary    Burning when urinating:     Blood in urine:        Psychiatric    Major depression:         Hematologic    Bleeding problems:    Problems with blood clotting too easily:        Skin    Rashes or ulcers:        Constitutional    Fever or chills:     PHYSICAL EXAM:   Vitals:   08/23/19 0826  BP: (!) 164/81  Pulse: 65  Resp: 20  Temp: 98 F (36.7 C)  SpO2: 97%  Weight: 44.9 kg  Height: 5\' 4"  (1.626 m)    GENERAL: The patient is a well-nourished female, in no acute distress. The vital signs are documented above. CARDIAC: There is a regular rate and rhythm.  VASCULAR: I do not detect carotid bruits. On the right side, which is the symptomatic side, she has a palpable femoral pulse.  I cannot palpate a popliteal or pedal pulses. On the left side she has a palpable femoral, popliteal, dorsalis pedis, posterior tibial pulse. She has no significant lower extremity swelling. PULMONARY: There is good air exchange bilaterally without wheezing or rales. ABDOMEN: Soft  and non-tender with normal pitched bowel sounds.  MUSCULOSKELETAL: There are no major deformities or cyanosis. NEUROLOGIC: No focal weakness or paresthesias are detected. SKIN: There are no ulcers or rashes noted. PSYCHIATRIC: The patient has a normal affect.  DATA:    ARTERIAL DOPPLER STUDY: I have independently interpreted her arterial Doppler study today.  On the right side there is a monophasic dorsalis pedis and posterior tibial signal with the Doppler.  ABI is 70%.  Toe pressures 170 mmHg.  On the left side there is a triphasic dorsalis pedis and posterior tibial signal.  ABIs 100%.  Toe pressure 127 mmHg.

## 2019-08-24 ENCOUNTER — Encounter: Payer: Medicare Other | Admitting: Occupational Therapy

## 2019-08-24 ENCOUNTER — Ambulatory Visit: Payer: Medicare Other

## 2019-08-28 ENCOUNTER — Other Ambulatory Visit: Payer: Self-pay | Admitting: *Deleted

## 2019-08-28 DIAGNOSIS — I739 Peripheral vascular disease, unspecified: Secondary | ICD-10-CM

## 2019-08-29 ENCOUNTER — Ambulatory Visit: Payer: Medicare Other

## 2019-08-29 ENCOUNTER — Encounter: Payer: Medicare Other | Admitting: Occupational Therapy

## 2019-08-31 ENCOUNTER — Ambulatory Visit: Payer: Medicare Other

## 2019-08-31 ENCOUNTER — Encounter: Payer: Medicare Other | Admitting: Occupational Therapy

## 2019-10-05 ENCOUNTER — Encounter: Payer: Self-pay | Admitting: Speech Pathology

## 2019-10-05 NOTE — Therapy (Signed)
SPEECH THERAPY DISCHARGE SUMMARY  Visits from Start of Care: 3  Current functional level related to goals / functional outcomes: Not met; pt discharged s/p several no shows.   Remaining deficits: Dysphagia, cognitive deficits   Education / Equipment: Safe swallow strategies Plan: Patient agrees to discharge.  Patient goals were not met. Patient is being discharged due to not returning since the last visit.  ?????          SLP Short Term Goals - 10/05/19 1526      SLP SHORT TERM GOAL #1   Title pt will demo safe swallow strategies with rare min A and visual support x 3 sessions    Time 3    Period Weeks   or 9 total visits - for STGs 1-3   Status Not Met      SLP SHORT TERM GOAL #2   Title pt will demo selective attention adequate for working on simple tasks for 15 minutes in min-mod noisy environement x2 sessions    Time 3    Period Weeks    Status Not Met      SLP SHORT TERM GOAL #3   Title pt will demo intellectual awareness by telling SLP 3 non-physical deficits in 3 sessions    Time 3    Period Weeks    Status Not Met      SLP SHORT TERM GOAL #4   Title pt will complete simple linguistic-based detailed tasks 90% success given self correction, in 3 sessions    Time 5    Period Weeks   or 13 total visits   Status Not Met      SLP SHORT TERM GOAL #5   Title pt will demo problem solving resulting in 80% success in simple linguistic based tasks over three sesssions    Time 5    Period Weeks   or 13 total visits   Status Not Met      SLP SHORT TERM GOAL #6   Title pt will demo emergent awareness 90% of the time in linguistic-based tasks with occasional min cues in 3 sessions    Time 5    Period Weeks   or 13 total visits   Status Not Met            SLP Long Term Goals - 10/05/19 1526      SLP LONG TERM GOAL #1   Title Pt will demonstrate safe swallow strategies with modified independence over 3 sessions    Time 8    Period Weeks   or 19 total  sessions, for all 9 week LTGs   Status Not Met      SLP LONG TERM GOAL #2   Title Pt will demo WNL alternating attention  between 2 mod complex linguistic based tasks x3 sessions    Time 8    Status Not Met      SLP LONG TERM GOAL #3   Title Pt will demo WFL divided attention  between 2 simple linguistic based tasks x2 sessions    Time 11    Period Weeks   or 25 total sessions, for all 12 week LTGs   Status Not Met      SLP LONG TERM GOAL #4   Title Pt will demo WNL emergent awareness in linguistic based tasks with modified independence (self-correction) in 3 sessions    Time 8    Period Weeks    Status Not Met      SLP  LONG TERM GOAL #5   Title Pt will demo functional anticipatory awareness in linguistic based tasks with modified independence in 3 sessions    Time 11    Period Weeks    Status Not Met      SLP LONG TERM GOAL #6   Title pt will complete detailed linguistic-based tasks with 100% success with modified independence    Time 11    Period Weeks    Status Not Met           Stacie Lever, MS, [facility] [address] [place], [place], [address] Phone: [phone]   Fax:  [phone]  Patient Details  Name: Stacie [name] MRN: [id number] Date of Birth: [birth year] Referring Provider:  No ref. provider found  Encounter Date: 10/05/2019   Stacie [name] 10/05/2019, 3:26 PM  [facility] [address] [place], [place], [address] Phone: [phone]   Fax:  [phone]

## 2019-10-17 ENCOUNTER — Telehealth: Payer: Self-pay | Admitting: Nurse Practitioner

## 2019-10-17 NOTE — Progress Notes (Signed)
  Chronic Care Management   Outreach Note  10/17/2019 Name: Stacie [name] MRN: [id number] DOB: 10/11/1955  Referred by: [name], NP Reason for referral : No chief complaint on file.   An unsuccessful telephone outreach was attempted today. The patient was referred to the pharmacist for assistance with care management and care coordination.   Follow Up Plan:   Carley Perdue [employer] Scheduler

## 2019-11-10 ENCOUNTER — Telehealth: Payer: Self-pay | Admitting: Nurse Practitioner

## 2019-11-10 ENCOUNTER — Other Ambulatory Visit: Payer: Self-pay | Admitting: Nurse Practitioner

## 2019-11-10 DIAGNOSIS — F411 Generalized anxiety disorder: Secondary | ICD-10-CM

## 2019-11-10 DIAGNOSIS — F3342 Major depressive disorder, recurrent, in full remission: Secondary | ICD-10-CM

## 2019-11-10 NOTE — Progress Notes (Signed)
°  Chronic Care Management   Outreach Note  11/10/2019 Name: Corrin Sieling MRN: [id number] DOB: [birth year]  Referred by: [name], NP Reason for referral : No chief complaint on file.   Third unsuccessful telephone outreach was attempted today. The patient was referred to the pharmacist for assistance with care management and care coordination.   Follow Up Plan:   Carley Perdue [employer] Scheduler

## 2019-11-27 ENCOUNTER — Telehealth: Payer: Self-pay | Admitting: Nurse Practitioner

## 2019-11-27 NOTE — Telephone Encounter (Signed)
Left message for patient to schedule Annual Wellness Visit.  Please schedule with Nurse Health Advisor Martha Stanley, RN at [facility]  °

## 2020-02-01 ENCOUNTER — Telehealth: Payer: Self-pay

## 2020-02-01 DIAGNOSIS — I10 Essential (primary) hypertension: Secondary | ICD-10-CM

## 2020-02-01 MED ORDER — AMLODIPINE BESYLATE 10 MG PO TABS: 10.0000 mg | ORAL_TABLET | Freq: Every day | ORAL | 0 refills | Status: DC

## 2020-02-01 NOTE — Telephone Encounter (Signed)
Last Ov 06/20/19 Last fill 05/18/19  #90/1

## 2020-02-06 ENCOUNTER — Other Ambulatory Visit: Payer: Self-pay

## 2020-02-06 ENCOUNTER — Ambulatory Visit (INDEPENDENT_AMBULATORY_CARE_PROVIDER_SITE_OTHER): Payer: Medicare Other

## 2020-02-06 VITALS — BP 158/80 | HR 77 | Temp 97.7°F | Resp 16 | Ht 64.0 in | Wt 100.0 lb

## 2020-02-06 DIAGNOSIS — Z1231 Encounter for screening mammogram for malignant neoplasm of breast: Secondary | ICD-10-CM

## 2020-02-06 DIAGNOSIS — Z0000 Encounter for general adult medical examination without abnormal findings: Secondary | ICD-10-CM | POA: Diagnosis not present

## 2020-02-06 NOTE — Patient Instructions (Signed)
Stacie [name] , Thank you for taking time to come for your Medicare Wellness Visit. I appreciate your ongoing commitment to your health goals. Please review the following plan we discussed and let me know if I can assist you in the future.   Screening recommendations/referrals: Colonoscopy: Completed 09/01/2018-Due 08/31/2028 Mammogram: Ordered today. Someone will be calling you to schedule. Bone Density: Not yet indicated. Due at age 65 Recommended yearly ophthalmology/optometry visit for glaucoma screening and checkup Recommended yearly dental visit for hygiene and checkup  Vaccinations: Influenza vaccine: Declined Pneumococcal vaccine: Declined Tdap vaccine: Discuss with pharmacy Shingles vaccine: Declined Covid-19: Declined  Advanced directives: Information given today  Conditions/risks identified: See problem list  Next appointment: Follow up in one year for your annual wellness visit.   Preventive Care 40-64 Years, Female Preventive care refers to lifestyle choices and visits with your health care provider that can promote health and wellness. What does preventive care include?  A yearly physical exam. This is also called an annual well check.  Dental exams once or twice a year.  Routine eye exams. Ask your health care provider how often you should have your eyes checked.  Personal lifestyle choices, including:  Daily care of your teeth and gums.  Regular physical activity.  Eating a healthy diet.  Avoiding tobacco and drug use.  Limiting alcohol use.  Practicing safe sex.  Taking low-dose aspirin daily starting at age 30.  Taking vitamin and mineral supplements as recommended by your health care provider. What happens during an annual well check? The services and screenings done by your health care provider during your annual well check will depend on your age, overall health, lifestyle risk factors, and family history of disease. Counseling  Your health care  provider may ask you questions about your:  Alcohol use.  Tobacco use.  Drug use.  Emotional well-being.  Home and relationship well-being.  Sexual activity.  Eating habits.  Work and work Statistician.  Method of birth control.  Menstrual cycle.  Pregnancy history. Screening  You may have the following tests or measurements:  Height, weight, and BMI.  Blood pressure.  Lipid and cholesterol levels. These may be checked every 5 years, or more frequently if you are over 79 years old.  Skin check.  Lung cancer screening. You may have this screening every year starting at age 76 if you have a 30-pack-year history of smoking and currently smoke or have quit within the past 15 years.  Fecal occult blood test (FOBT) of the stool. You may have this test every year starting at age 77.  Flexible sigmoidoscopy or colonoscopy. You may have a sigmoidoscopy every 5 years or a colonoscopy every 10 years starting at age 44.  Hepatitis C blood test.  Hepatitis B blood test.  Sexually transmitted disease (STD) testing.  Diabetes screening. This is done by checking your blood sugar (glucose) after you have not eaten for a while (fasting). You may have this done every 1-3 years.  Mammogram. This may be done every 1-2 years. Talk to your health care provider about when you should start having regular mammograms. This may depend on whether you have a family history of breast cancer.  BRCA-related cancer screening. This may be done if you have a family history of breast, ovarian, tubal, or peritoneal cancers.  Pelvic exam and Pap test. This may be done every 3 years starting at age 55. Starting at age 54, this may be done every 5 years if you have a  Pap test in combination with an HPV test.  Bone density scan. This is done to screen for osteoporosis. You may have this scan if you are at high risk for osteoporosis. Discuss your test results, treatment options, and if necessary, the need  for more tests with your health care provider. Vaccines  Your health care provider may recommend certain vaccines, such as:  Influenza vaccine. This is recommended every year.  Tetanus, diphtheria, and acellular pertussis (Tdap, Td) vaccine. You may need a Td booster every 10 years.  Zoster vaccine. You may need this after age 4.  Pneumococcal 13-valent conjugate (PCV13) vaccine. You may need this if you have certain conditions and were not previously vaccinated.  Pneumococcal polysaccharide (PPSV23) vaccine. You may need one or two doses if you smoke cigarettes or if you have certain conditions. Talk to your health care provider about which screenings and vaccines you need and how often you need them. This information is not intended to replace advice given to you by your health care provider. Make sure you discuss any questions you have with your health care provider. Document Released: 02/15/2015 Document Revised: 10/09/2015 Document Reviewed: 11/20/2014 Elsevier Interactive Patient Education  2017 [place] Prevention in the Home Falls can cause injuries. They can happen to people of all ages. There are many things you can do to make your home safe and to help prevent falls. What can I do on the outside of my home?  Regularly fix the edges of walkways and driveways and fix any cracks.  Remove anything that might make you trip as you walk through a door, such as a raised step or threshold.  Trim any bushes or trees on the path to your home.  Use bright outdoor lighting.  Clear any walking paths of anything that might make someone trip, such as rocks or tools.  Regularly check to see if handrails are loose or broken. Make sure that both sides of any steps have handrails.  Any raised decks and porches should have guardrails on the edges.  Have any leaves, snow, or ice cleared regularly.  Use sand or salt on walking paths during winter.  Clean up any spills in  your garage right away. This includes oil or grease spills. What can I do in the bathroom?  Use night lights.  Install grab bars by the toilet and in the tub and shower. Do not use towel bars as grab bars.  Use non-skid mats or decals in the tub or shower.  If you need to sit down in the shower, use a plastic, non-slip stool.  Keep the floor dry. Clean up any water that spills on the floor as soon as it happens.  Remove soap buildup in the tub or shower regularly.  Attach bath mats securely with double-sided non-slip rug tape.  Do not have throw rugs and other things on the floor that can make you trip. What can I do in the bedroom?  Use night lights.  Make sure that you have a light by your bed that is easy to reach.  Do not use any sheets or blankets that are too big for your bed. They should not hang down onto the floor.  Have a firm chair that has side arms. You can use this for support while you get dressed.  Do not have throw rugs and other things on the floor that can make you trip. What can I do in the kitchen?  Clean  up any spills right away.  Avoid walking on wet floors.  Keep items that you use a lot in easy-to-reach places.  If you need to reach something above you, use a strong step stool that has a grab bar.  Keep electrical cords out of the way.  Do not use floor polish or wax that makes floors slippery. If you must use wax, use non-skid floor wax.  Do not have throw rugs and other things on the floor that can make you trip. What can I do with my stairs?  Do not leave any items on the stairs.  Make sure that there are handrails on both sides of the stairs and use them. Fix handrails that are broken or loose. Make sure that handrails are as long as the stairways.  Check any carpeting to make sure that it is firmly attached to the stairs. Fix any carpet that is loose or worn.  Avoid having throw rugs at the top or bottom of the stairs. If you do have  throw rugs, attach them to the floor with carpet tape.  Make sure that you have a light switch at the top of the stairs and the bottom of the stairs. If you do not have them, ask someone to add them for you. What else can I do to help prevent falls?  Wear shoes that:  Do not have high heels.  Have rubber bottoms.  Are comfortable and fit you well.  Are closed at the toe. Do not wear sandals.  If you use a stepladder:  Make sure that it is fully opened. Do not climb a closed stepladder.  Make sure that both sides of the stepladder are locked into place.  Ask someone to hold it for you, if possible.  Clearly mark and make sure that you can see:  Any grab bars or handrails.  First and last steps.  Where the edge of each step is.  Use tools that help you move around (mobility aids) if they are needed. These include:  Canes.  Walkers.  Scooters.  Crutches.  Turn on the lights when you go into a dark area. Replace any light bulbs as soon as they burn out.  Set up your furniture so you have a clear path. Avoid moving your furniture around.  If any of your floors are uneven, fix them.  If there are any pets around you, be aware of where they are.  Review your medicines with your doctor. Some medicines can make you feel dizzy. This can increase your chance of falling. Ask your doctor what other things that you can do to help prevent falls. This information is not intended to replace advice given to you by your health care provider. Make sure you discuss any questions you have with your health care provider. Document Released: 11/15/2008 Document Revised: 06/27/2015 Document Reviewed: 02/23/2014 Elsevier Interactive Patient Education  2017 [employer].

## 2020-02-06 NOTE — Progress Notes (Signed)
Subjective:   Rheannon Spiers is a 65 y.o. female who presents for an Initial Medicare Annual Wellness Visit.  Review of Systems     Cardiac Risk Factors include: advanced age (>52men, >61 women);hypertension;dyslipidemia     Objective:    Today's Vitals   02/06/20 1238 02/06/20 1314  BP: (!) 158/80 (!) 158/80  Pulse: 77   Resp: 16   Temp: 97.7 F (36.5 C)   TempSrc: Temporal   SpO2: 98%   Weight: 100 lb (45.4 kg)   Height: 5\' 4"  (1.626 m)    Body mass index is 17.16 kg/m.  Advanced Directives 02/06/2020 08/23/2019 05/29/2019 05/29/2019 05/12/2019 05/04/2019 06/04/2017  Does Patient Have a Medical Advance Directive? Yes No No No No No No  Does patient want to make changes to medical advance directive? Yes (MAU/Ambulatory/Procedural Areas - Information given) - - - - - -  Would patient like information on creating a medical advance directive? - No - Patient declined No - Patient declined No - Patient declined No - Patient declined No - Patient declined Yes (MAU/Ambulatory/Procedural Areas - Information given)    Current Medications (verified) Outpatient Encounter Medications as of 02/06/2020  Medication Sig  . amLODipine (NORVASC) 10 MG tablet Take 1 tablet (10 mg total) by mouth daily.  Marland Kitchen aspirin EC 81 MG EC tablet Take 1 tablet (81 mg total) by mouth daily.  Marland Kitchen doxylamine, Sleep, (UNISOM) 25 MG tablet Take 25 mg by mouth at bedtime as needed for sleep.   Marland Kitchen escitalopram (LEXAPRO) 20 MG tablet TAKE 1 TABLET(20 MG) BY MOUTH DAILY  . folic acid (FOLVITE) 1 MG tablet Take 1 tablet (1 mg total) by mouth daily.  . hydrALAZINE (APRESOLINE) 100 MG tablet Take 1 tablet (100 mg total) by mouth every 8 (eight) hours.  . Multiple Vitamin (MULTIVITAMIN WITH MINERALS) TABS tablet Take 1 tablet by mouth daily.  . simvastatin (ZOCOR) 40 MG tablet Take 1 tablet (40 mg total) by mouth daily at 6 PM.  . acetaminophen (TYLENOL) 325 MG tablet Take 2 tablets (650 mg total) by mouth every 4 (four)  hours as needed for mild pain (or temp > 37.5 C (99.5 F)). (Patient not taking: Reported on 02/06/2020)  . isosorbide mononitrate (IMDUR) 30 MG 24 hr tablet TAKE 1 TABLET(30 MG) BY MOUTH DAILY (Patient not taking: Reported on 02/06/2020)   No facility-administered encounter medications on file as of 02/06/2020.    Allergies (verified) Trazodone and nefazodone and Wellbutrin [bupropion]   History: Past Medical History:  Diagnosis Date  . Colitis   . Hyperlipidemia   . Hypertension   . Stroke ([place]) 04/2012   unknown type  . Thyroid disease    History reviewed. No pertinent surgical history. Family History  Problem Relation Age of Onset  . Stroke Mother 44       embolic  . Heart disease Father 99       CAd with CABG, no MI  . Heart disease Sister 21       CAD  . Heart disease Paternal Uncle   . Heart disease Maternal Grandmother   . Heart disease Paternal Grandfather   . Colon cancer Neg Hx   . Esophageal cancer Neg Hx   . Stomach cancer Neg Hx    Social History   Socioeconomic History  . Marital status: Divorced    Spouse name: Not on file  . Number of children: Not on file  . Years of education: Not on file  .  Highest education level: Not on file  Occupational History  . Occupation: retired  Tobacco Use  . Smoking status: Former Smoker    Packs/day: 0.25    Years: 10.00    Pack years: 2.50    Types: Cigarettes    Quit date: 05/2019    Years since quitting: 0.7  . Smokeless tobacco: Never Used  Vaping Use  . Vaping Use: Never used  Substance and Sexual Activity  . Alcohol use: Yes    Alcohol/week: 14.0 standard drinks    Types: 14 Cans of beer per week    Comment: 8 beers a week  . Drug use: Yes    Frequency: 7.0 times per week    Types: Marijuana    Comment: to help with colitis- multiple times a day  . Sexual activity: Not Currently  Other Topics Concern  . Not on file  Social History Narrative  . Not on file   Social Determinants of Health    Financial Resource Strain: Low Risk   . Difficulty of Paying Living Expenses: Not hard at all  Food Insecurity: No Food Insecurity  . Worried About Charity fundraiser in the Last Year: Never true  . Ran Out of Food in the Last Year: Never true  Transportation Needs: No Transportation Needs  . Lack of Transportation (Medical): No  . Lack of Transportation (Non-Medical): No  Physical Activity: Inactive  . Days of Exercise per Week: 0 days  . Minutes of Exercise per Session: 0 min  Stress: No Stress Concern Present  . Feeling of Stress : Not at all  Social Connections: Socially Isolated  . Frequency of Communication with Friends and Family: More than three times a week  . Frequency of Social Gatherings with Friends and Family: More than three times a week  . Attends Religious Services: Never  . Active Member of Clubs or Organizations: No  . Attends Archivist Meetings: Never  . Marital Status: Divorced    Tobacco Counseling Counseling given: Not Answered   Clinical Intake:  Pre-visit preparation completed: Yes  Pain : No/denies pain     Nutritional Status: BMI <19  Underweight Nutritional Risks: None Diabetes: No  How often do you need to have someone help you when you read instructions, pamphlets, or other written materials from your doctor or pharmacy?: 1 - Never  Diabetic?No  Interpreter Needed?: No  Information entered by :: Caroleen Hamman LPN   Activities of Daily Living In your present state of health, do you have any difficulty performing the following activities: 02/06/2020 05/12/2019  Hearing? N N  Vision? N N  Difficulty concentrating or making decisions? N N  Walking or climbing stairs? N Y  Dressing or bathing? N Y  Doing errands, shopping? N N  Preparing Food and eating ? N -  Using the Toilet? N -  In the past six months, have you accidently leaked urine? Y -  Do you have problems with loss of bowel control? N -  Managing your  Medications? N -  Managing your Finances? N -  Housekeeping or managing your Housekeeping? N -  Some recent data might be hidden    Patient Care Team: Nche, Charlene Brooke, NP as PCP - General (Internal Medicine)  Indicate any recent Medical Services you may have received from other than [facility] providers in the past year (date may be approximate).     Assessment:   This is a routine wellness examination for Chanin.  Hearing/Vision  screen  Hearing Screening   125Hz  250Hz  500Hz  1000Hz  2000Hz  3000Hz  4000Hz  6000Hz  8000Hz   Right ear:           Left ear:           Comments: Mild hearing loss  Vision Screening Comments: Reading glasses Last eye exam-06/2019-  Dietary issues and exercise activities discussed: Current Exercise Habits: The patient does not participate in regular exercise at present, Exercise limited by: None identified  Goals    . Patient Stated     Increase activity      Depression Screen PHQ 2/9 Scores 02/06/2020 06/20/2019 10/18/2017 06/18/2017 06/04/2017 05/25/2016  PHQ - 2 Score 0 0 0 4 0 0  PHQ- 9 Score - 3 1 14  - -    Fall Risk Fall Risk  02/06/2020 07/27/2018 06/16/2018 10/18/2017 06/04/2017  Falls in the past year? 1 1 1  No No  Number falls in past yr: 0 0 0 - -  Comment - accident/called EMS - - -  Injury with Fall? 0 0 1 - -  Comment - black eyes/denied other symptoms bruises - -  Follow up Falls prevention discussed - - - -    FALL RISK PREVENTION PERTAINING TO THE HOME:  Any stairs in or around the home? Yes  If so, are there any without handrails? No  Home free of loose throw rugs in walkways, pet beds, electrical cords, etc? Yes  Adequate lighting in your home to reduce risk of falls? Yes   ASSISTIVE DEVICES UTILIZED TO PREVENT FALLS:  Life alert? No  Use of a cane, walker or w/c? No  Grab bars in the bathroom? Yes  Shower chair or bench in shower? No  Elevated toilet seat or a handicapped toilet? No   TIMED UP AND GO:  Was the test performed? Yes  .  Length of time to ambulate 10 feet: 9 sec.   Gait steady and fast without use of assistive device  Cognitive Function:     6CIT Screen 02/06/2020  What Year? 0 points  What month? 0 points  What time? 0 points  Count back from 20 0 points  Months in reverse 4 points  Repeat phrase 0 points  Total Score 4    Immunizations  There is no immunization history on file for this patient.  TDAP status: Due, Education has been provided regarding the importance of this vaccine. Advised may receive this vaccine at local pharmacy or Health Dept. Aware to provide a copy of the vaccination record if obtained from local pharmacy or Health Dept. Verbalized acceptance and understanding.  Flu Vaccine status: Declined, Education has been provided regarding the importance of this vaccine but patient still declined. Advised may receive this vaccine at local pharmacy or Health Dept. Aware to provide a copy of the vaccination record if obtained from local pharmacy or Health Dept. Verbalized acceptance and understanding.  Pneumococcal vaccine status: Not yet indicated  Covid-19 vaccine status: Declined, Education has been provided regarding the importance of this vaccine but patient still declined. Advised may receive this vaccine at local pharmacy or Health Dept.or vaccine clinic. Aware to provide a copy of the vaccination record if obtained from local pharmacy or Health Dept. Verbalized acceptance and understanding.  Qualifies for Shingles Vaccine? Yes   Zostavax completed No   Shingrix Completed?: No.    Education has been provided regarding the importance of this vaccine. Patient has been advised to call insurance company to determine out of pocket expense if they  have not yet received this vaccine. Advised may also receive vaccine at local pharmacy or Health Dept. Verbalized acceptance and understanding.  Screening Tests Health Maintenance  Topic Date Due  . COVID-19 Vaccine (1) Never done  .  TETANUS/TDAP  Never done  . MAMMOGRAM  Never done  . INFLUENZA VACCINE  Never done  . PAP SMEAR-Modifier  06/04/2020  . COLONOSCOPY (Pts 45-63yrs Insurance coverage will need to be confirmed)  08/31/2028  . Hepatitis C Screening  Completed  . HIV Screening  Completed    Health Maintenance  Health Maintenance Due  Topic Date Due  . COVID-19 Vaccine (1) Never done  . TETANUS/TDAP  Never done  . MAMMOGRAM  Never done  . INFLUENZA VACCINE  Never done    Colorectal cancer screening: Type of screening: Colonoscopy. Completed 09/01/2018. Repeat every 10 years  Mammogram status: Ordered today. Pt provided with contact info and advised to call to schedule appt.   Bone Density status:Not yet indicated  Lung Cancer Screening: (Low Dose CT Chest recommended if Age 31-80 years, 30 pack-year currently smoking OR have quit w/in 15years.) does not qualify.     Additional Screening:  Hepatitis C Screening: Completed 05/21/2016  Vision Screening: Recommended annual ophthalmology exams for early detection of glaucoma and other disorders of the eye. Is the patient up to date with their annual eye exam?  Yes  Who is the provider or what is the name of the office in which the patient attends annual eye exams? Unsure of name  Dental Screening: Recommended annual dental exams for proper oral hygiene  Community Resource Referral / Chronic Care Management: CRR required this visit?  No   CCM required this visit?  No      Plan:     I have personally reviewed and noted the following in the patient's chart:   . Medical and social history . Use of alcohol, tobacco or illicit drugs  . Current medications and supplements . Functional ability and status . Nutritional status . Physical activity . Advanced directives . List of other physicians . Hospitalizations, surgeries, and ER visits in previous 12 months . Vitals . Screenings to include cognitive, depression, and falls . Referrals and  appointments  In addition, I have reviewed and discussed with patient certain preventive protocols, quality metrics, and best practice recommendations. A written personalized care plan for preventive services as well as general preventive health recommendations were provided to patient.     Marta Antu, LPN   [id number]  Nurse Health Advisor  Nurse Notes: None

## 2020-03-12 ENCOUNTER — Other Ambulatory Visit: Payer: Self-pay

## 2020-03-13 ENCOUNTER — Ambulatory Visit (INDEPENDENT_AMBULATORY_CARE_PROVIDER_SITE_OTHER): Payer: Medicare Other | Admitting: Nurse Practitioner

## 2020-03-13 ENCOUNTER — Telehealth: Payer: Self-pay | Admitting: Nurse Practitioner

## 2020-03-13 ENCOUNTER — Encounter: Payer: Self-pay | Admitting: Nurse Practitioner

## 2020-03-13 VITALS — BP 134/60 | HR 87 | Temp 98.2°F | Ht 64.0 in | Wt 101.2 lb

## 2020-03-13 DIAGNOSIS — R600 Localized edema: Secondary | ICD-10-CM

## 2020-03-13 DIAGNOSIS — I739 Peripheral vascular disease, unspecified: Secondary | ICD-10-CM

## 2020-03-13 DIAGNOSIS — R739 Hyperglycemia, unspecified: Secondary | ICD-10-CM | POA: Diagnosis not present

## 2020-03-13 DIAGNOSIS — E782 Mixed hyperlipidemia: Secondary | ICD-10-CM

## 2020-03-13 DIAGNOSIS — I10 Essential (primary) hypertension: Secondary | ICD-10-CM

## 2020-03-13 DIAGNOSIS — F3341 Major depressive disorder, recurrent, in partial remission: Secondary | ICD-10-CM | POA: Insufficient documentation

## 2020-03-13 DIAGNOSIS — F1020 Alcohol dependence, uncomplicated: Secondary | ICD-10-CM | POA: Insufficient documentation

## 2020-03-13 DIAGNOSIS — L509 Urticaria, unspecified: Secondary | ICD-10-CM | POA: Diagnosis not present

## 2020-03-13 LAB — BASIC METABOLIC PANEL
BUN: 18 mg/dL (ref 6–23)
CO2: 28 mEq/L (ref 19–32)
Calcium: 9.4 mg/dL (ref 8.4–10.5)
Chloride: 103 mEq/L (ref 96–112)
Creatinine, Ser: 0.82 mg/dL (ref 0.40–1.20)
GFR: 75.41 mL/min (ref >60.00)
Glucose, Bld: 87 mg/dL (ref 70–99)
Potassium: 4.4 mEq/L (ref 3.5–5.1)
Sodium: 140 mEq/L (ref 135–145)

## 2020-03-13 LAB — CBC WITH DIFFERENTIAL/PLATELET
Basophils Absolute: 0 10*3/uL (ref 0.0–0.1)
Basophils Relative: 0.6 % (ref 0.0–3.0)
Eosinophils Absolute: 0.2 10*3/uL (ref 0.0–0.7)
Eosinophils Relative: 2.2 % (ref 0.0–5.0)
HCT: 37.4 % (ref 36.0–46.0)
Hemoglobin: 12.2 g/dL (ref 12.0–15.0)
Lymphocytes Relative: 19.8 % (ref 12.0–46.0)
Lymphs Abs: 1.5 10*3/uL (ref 0.7–4.0)
MCHC: 32.7 g/dL (ref 30.0–36.0)
MCV: 89.6 fl (ref 78.0–100.0)
Monocytes Absolute: 0.7 10*3/uL (ref 0.1–1.0)
Monocytes Relative: 9.3 % (ref 3.0–12.0)
Neutro Abs: 5 10*3/uL (ref 1.4–7.7)
Neutrophils Relative %: 68.1 % (ref 43.0–77.0)
Platelets: 306 10*3/uL (ref 150.0–400.0)
RBC: 4.17 Mil/uL (ref 3.87–5.11)
RDW: 15.2 % (ref 11.5–15.5)
WBC: 7.3 10*3/uL (ref 4.0–10.5)

## 2020-03-13 LAB — LIPID PANEL
Cholesterol: 173 mg/dL (ref 0–200)
HDL: 68 mg/dL (ref >39.00)
LDL Cholesterol: 86 mg/dL (ref 0–99)
NonHDL: 105.05
Total CHOL/HDL Ratio: 3
Triglycerides: 96 mg/dL (ref 0.0–149.0)
VLDL: 19.2 mg/dL (ref 0.0–40.0)

## 2020-03-13 LAB — HEMOGLOBIN A1C: Hgb A1c MFr Bld: 5.7 % (ref 4.6–6.5)

## 2020-03-13 LAB — TSH: TSH: 2.46 u[IU]/mL (ref 0.35–4.50)

## 2020-03-13 MED ORDER — ISOSORBIDE MONONITRATE ER 30 MG PO TB24: ORAL_TABLET | ORAL | 3 refills | Status: DC

## 2020-03-13 MED ORDER — AMLODIPINE BESYLATE 10 MG PO TABS: 10.0000 mg | ORAL_TABLET | Freq: Every day | ORAL | 0 refills | Status: DC

## 2020-03-13 MED ORDER — HYDROXYZINE PAMOATE 25 MG PO CAPS: 25.0000 mg | ORAL_CAPSULE | Freq: Two times a day (BID) | ORAL | 0 refills | Status: DC | PRN

## 2020-03-13 NOTE — Assessment & Plan Note (Signed)
BP at goal Current use of statin Quit tobacco use 05/2019 Acute left LE edema, unable to palpate bilateral pedal pulse, ordered venous doppler. Also followed by Dr.  Check lipid panel and HgbA1c.

## 2020-03-13 NOTE — Patient Instructions (Addendum)
Resume imdur, refill sent Use vistaril for itching Start zyrtec 10mg  daily and famotidine 20mg  BID X2weeks. Use hypoallergenic cream to moisturize: cerave or Cetaphil or Vaseline or aquafor.  Memory screen was normal.  Go to lab for blood draw.

## 2020-03-13 NOTE — Assessment & Plan Note (Signed)
BP at goal with hydralazine, imdur and amlodipine. BP Readings from Last 3 Encounters:  03/13/20 134/60  02/06/20 (!) 158/80  08/23/19 (!) 164/81   Maintain current medications. Repeat BMP F/up in 58months

## 2020-03-13 NOTE — Telephone Encounter (Signed)
Patients daughter [name] is calling to see if Liver Panel (LFT) can be added to patients labs. Please advise.

## 2020-03-13 NOTE — Progress Notes (Signed)
Subjective:  Patient ID: Stacie [name], female    DOB: 1955-02-21  Age: 65 y.o. MRN: [id number]  CC: Acute Visit (Pt c/o rash over her body x 3 weeks. Pt states it started on her legs and now it is on more parts of her body. Pt states rash itches until the skin becomes dry and then the itching stops.)   Accompanied by daughter-Stacie [name] She is concerned about Stacie [name]'s memory deficit. Stacie [name] lives with her ex-husband. She is visited frequently by her 2daughters. Her ex-husbands assists with driving and grocery shopping. She states diarrhea has resolved.  Rash This is a new problem. The current episode started 1 to 4 weeks ago. The problem is unchanged. The rash is diffuse. The rash is characterized by itchiness, scaling, redness and dryness. It is unknown if there was an exposure to a precipitant. Pertinent negatives include no anorexia, congestion, cough, fatigue, fever, joint pain, shortness of breath or sore throat. Past treatments include topical steroids. The treatment provided mild relief. There is no history of allergies, asthma, eczema or varicella.  She also reports acute onset of left LE swelling, no claudication, no injury. No change with use of compression stocking.  Essential hypertension BP at goal with hydralazine, imdur and amlodipine. BP Readings from Last 3 Encounters:  03/13/20 134/60  02/06/20 (!) 158/80  08/23/19 (!) 164/81   Maintain current medications. Repeat BMP F/up in 91months  PAD (peripheral artery disease) (HCC) BP at goal Current use of statin Quit tobacco use 05/2019 Acute left LE edema, unable to palpate bilateral pedal pulse, ordered venous doppler. Also followed by Dr.  Check lipid panel and HgbA1c.  Wt Readings from Last 3 Encounters:  03/13/20 101 lb 3.2 oz (45.9 kg)  02/06/20 100 lb (45.4 kg)  08/23/19 99 lb (44.9 kg)   BP Readings from Last 3 Encounters:  03/13/20 134/60  02/06/20 (!) 158/80  08/23/19 (!) 164/81     MMSE - Mini Mental State Exam 03/13/2020  Orientation to time 5  Orientation to Place 5  Registration 2  Attention/ Calculation 5  Recall 2  Language- name 2 objects 2  Language- repeat 1  Language- follow 3 step command 3  Language- read & follow direction 1  Write a sentence 1  Copy design 1  Total score 28   Reviewed past Medical, Social and Family history today.  Outpatient Medications Prior to Visit  Medication Sig Dispense Refill  . aspirin EC 81 MG EC tablet Take 1 tablet (81 mg total) by mouth daily.    Marland Kitchen escitalopram (LEXAPRO) 20 MG tablet TAKE 1 TABLET(20 MG) BY MOUTH DAILY 90 tablet 3  . folic acid (FOLVITE) 1 MG tablet Take 1 tablet (1 mg total) by mouth daily. 90 tablet 1  . hydrALAZINE (APRESOLINE) 100 MG tablet Take 1 tablet (100 mg total) by mouth every 8 (eight) hours. 90 tablet 11  . Multiple Vitamin (MULTIVITAMIN WITH MINERALS) TABS tablet Take 1 tablet by mouth daily.    . simvastatin (ZOCOR) 40 MG tablet Take 1 tablet (40 mg total) by mouth daily at 6 PM. 90 tablet 3  . amLODipine (NORVASC) 10 MG tablet Take 1 tablet (10 mg total) by mouth daily. 90 tablet 0  . doxylamine, Sleep, (UNISOM) 25 MG tablet Take 25 mg by mouth at bedtime as needed for sleep.     Marland Kitchen acetaminophen (TYLENOL) 325 MG tablet Take 2 tablets (650 mg total) by mouth every 4 (four) hours as needed for mild  pain (or temp > 37.5 C (99.5 F)). (Patient not taking: No sig reported)    . isosorbide mononitrate (IMDUR) 30 MG 24 hr tablet TAKE 1 TABLET(30 MG) BY MOUTH DAILY (Patient not taking: No sig reported) 90 tablet 3   No facility-administered medications prior to visit.    ROS See HPI  Objective:  BP 134/60 (BP Location: Left Arm, Patient Position: Sitting, Cuff Size: Normal)   Pulse 87   Temp 98.2 F (36.8 C) (Temporal)   Ht 5\' 4"  (1.626 m)   Wt 101 lb 3.2 oz (45.9 kg)   SpO2 98%   BMI 17.37 kg/m   Physical Exam Cardiovascular:     Rate and Rhythm: Normal rate.  Pulmonary:      Effort: Pulmonary effort is normal.  Musculoskeletal:     Right lower leg: No edema.     Left lower leg: Edema present.  Skin:    Findings: Rash present.  Neurological:     Mental Status: She is alert and oriented to person, place, and time.    Assessment & Plan:  This visit occurred during the SARS-CoV-2 public health emergency.  Safety protocols were in place, including screening questions prior to the visit, additional usage of staff PPE, and extensive cleaning of exam room while observing appropriate contact time as indicated for disinfecting solutions.   Stacie [name] was seen today for acute visit.  Diagnoses and all orders for this visit:  Essential hypertension -     isosorbide mononitrate (IMDUR) 30 MG 24 hr tablet; TAKE 1 TABLET(30 MG) BY MOUTH DAILY -     amLODipine (NORVASC) 10 MG tablet; Take 1 tablet (10 mg total) by mouth daily. -     Basic metabolic panel -     Cancel: Hepatic function panel  Urticaria -     TSH -     CBC w/Diff -     Chronic Urticaria -     hydrOXYzine (VISTARIL) 25 MG capsule; Take 1 capsule (25 mg total) by mouth every 12 (twelve) hours as needed. -     Cancel: Hepatic function panel  Mixed hyperlipidemia -     Lipid panel  Hyperglycemia -     Hemoglobin A1c  Leg edema, left -     VAS [place] LOWER EXTREMITY VENOUS (DVT); Future  PAD (peripheral artery disease) ([place])    Problem List Items Addressed This Visit      Cardiovascular and Mediastinum   Essential hypertension - Primary    BP at goal with hydralazine, imdur and amlodipine. BP Readings from Last 3 Encounters:  03/13/20 134/60  02/06/20 (!) 158/80  08/23/19 (!) 164/81   Maintain current medications. Repeat BMP F/up in 84months      Relevant Medications   isosorbide mononitrate (IMDUR) 30 MG 24 hr tablet   amLODipine (NORVASC) 10 MG tablet   Other Relevant Orders   Basic metabolic panel (Completed)   PAD (peripheral artery disease) (HCC)    BP at goal Current use of  statin Quit tobacco use 05/2019 Acute left LE edema, unable to palpate bilateral pedal pulse, ordered venous doppler. Also followed by Dr.  Check lipid panel and HgbA1c.      Relevant Medications   isosorbide mononitrate (IMDUR) 30 MG 24 hr tablet   amLODipine (NORVASC) 10 MG tablet     Other   Hyperlipidemia   Relevant Medications   isosorbide mononitrate (IMDUR) 30 MG 24 hr tablet   amLODipine (NORVASC) 10 MG tablet   Other Relevant  Orders   Lipid panel (Completed)    Other Visit Diagnoses    Urticaria       Relevant Medications   hydrOXYzine (VISTARIL) 25 MG capsule   Other Relevant Orders   TSH (Completed)   CBC w/Diff (Completed)   Chronic Urticaria (Completed)   Hyperglycemia       Relevant Orders   Hemoglobin A1c (Completed)   Leg edema, left       Relevant Orders   VAS [place] LOWER EXTREMITY VENOUS (DVT)      Follow-up: Return in about 3 months (around 06/10/2020) for HTN and , hyperlipidemia (10mins).  Wilfred Lacy, NP

## 2020-03-14 ENCOUNTER — Other Ambulatory Visit (INDEPENDENT_AMBULATORY_CARE_PROVIDER_SITE_OTHER): Payer: Medicare Other

## 2020-03-14 DIAGNOSIS — I10 Essential (primary) hypertension: Secondary | ICD-10-CM | POA: Diagnosis not present

## 2020-03-14 LAB — HEPATIC FUNCTION PANEL
ALT: 19 U/L (ref 0–35)
AST: 27 U/L (ref 0–37)
Albumin: 4.5 g/dL (ref 3.5–5.2)
Alkaline Phosphatase: 88 U/L (ref 39–117)
Bilirubin, Direct: 0.1 mg/dL (ref 0.0–0.3)
Total Bilirubin: 0.3 mg/dL (ref 0.2–1.2)
Total Protein: 6.9 g/dL (ref 6.0–8.3)

## 2020-03-14 NOTE — Telephone Encounter (Signed)
Ask angela if this can be added. If so, go ahead

## 2020-03-16 ENCOUNTER — Encounter: Payer: Self-pay | Admitting: Nurse Practitioner

## 2020-03-18 ENCOUNTER — Ambulatory Visit (HOSPITAL_COMMUNITY)
Admission: RE | Admit: 2020-03-18 | Discharge: 2020-03-18 | Disposition: A | Discharge status: Home / Self Care | Payer: Medicare Other | Source: Ambulatory Visit | Attending: Cardiology | Admitting: Cardiology

## 2020-03-18 ENCOUNTER — Other Ambulatory Visit: Payer: Self-pay

## 2020-03-18 DIAGNOSIS — R600 Localized edema: Secondary | ICD-10-CM

## 2020-03-26 LAB — CHRONIC URTICARIA: cu index: 7.2 (ref <10)

## 2020-04-29 ENCOUNTER — Other Ambulatory Visit: Payer: Self-pay | Admitting: Nurse Practitioner

## 2020-04-29 DIAGNOSIS — I10 Essential (primary) hypertension: Secondary | ICD-10-CM

## 2020-05-16 ENCOUNTER — Telehealth: Payer: Self-pay | Admitting: Nurse Practitioner

## 2020-05-16 DIAGNOSIS — L509 Urticaria, unspecified: Secondary | ICD-10-CM

## 2020-05-16 NOTE — Telephone Encounter (Signed)
Pt's daughter [name] is calling in to get her mom a referral to see  a dermatologist. She keeps having a recurring rash. [name] says , her mom and Baldo Ash have talked about this before. Please advise

## 2020-05-22 ENCOUNTER — Encounter: Payer: Self-pay | Admitting: Vascular Surgery

## 2020-05-22 ENCOUNTER — Ambulatory Visit (HOSPITAL_COMMUNITY)
Admission: RE | Admit: 2020-05-22 | Discharge: 2020-05-22 | Disposition: A | Discharge status: Home / Self Care | Payer: Medicare Other | Source: Ambulatory Visit | Attending: Vascular Surgery | Admitting: Vascular Surgery

## 2020-05-22 ENCOUNTER — Ambulatory Visit: Payer: Medicare Other | Admitting: Vascular Surgery

## 2020-05-22 ENCOUNTER — Other Ambulatory Visit: Payer: Self-pay

## 2020-05-22 VITALS — BP 126/71 | HR 73 | Temp 98.4°F | Resp 14 | Ht 65.0 in | Wt 102.0 lb

## 2020-05-22 DIAGNOSIS — I739 Peripheral vascular disease, unspecified: Secondary | ICD-10-CM | POA: Diagnosis not present

## 2020-05-22 NOTE — Progress Notes (Signed)
REASON FOR VISIT:   Follow-up of peripheral vascular disease.  MEDICAL ISSUES:   PERIPHERAL VASCULAR DISEASE: This patient has stable claudication of the right lower extremity related to a superficial femoral artery stenosis.  She quit smoking in April of last year.  She remains fairly active.  She is on aspirin and is on a statin.  Given that her symptoms are stable and she actually has palpable pulses bilaterally I do not think routine follow-up is necessary.  I explained that if her claudication symptoms worsen or she develops rest pain or nonhealing ulcer then certainly we would want to know.  I will be happy to see her back at any time if her symptoms progress or she develops any new vascular issues.   HPI:   Stacie [name] is a pleasant 65 y.o. female who I saw in consultation with peripheral vascular disease on 08/23/2019.  She had evidence of superficial femoral artery occlusive disease on the right and had stable claudication.  She quit smoking in April 2021.  We discussed importance of a structured walking program we also discussed importance of nutrition.  I set her up for a 87-month follow-up visit with follow-up ABIs.  She was on aspirin and was on a statin.  At the time of her last visit her ABI was 70% on the right and 100% on the left.  Since I saw her last, she does have some mild right calf claudication.  Her symptoms are brought on by ambulation and relieved with rest.  She has no rest pain.  She is had no history of nonhealing wounds.  She has remained off cigarettes since April 2021.  She is on aspirin and is on a statin.  Her only complaint has been itching all over and she is scheduled to see the dermatologist on Monday.  She cannot think of any new medications, foods, laundry detergents or other changes that might explain her allergic reaction.  I recommended oatmeal bath as nothing else seems to have helped.  Past Medical History:  Diagnosis Date  . Colitis    . Hyperlipidemia   . Hypertension   . Stroke ([place]) 04/2012   unknown type  . Thyroid disease     Family History  Problem Relation Age of Onset  . Stroke Mother 42       embolic  . Heart disease Father 74       CAd with CABG, no MI  . Heart disease Sister 67       CAD  . Heart disease Paternal Uncle   . Heart disease Maternal Grandmother   . Heart disease Paternal Grandfather   . Colon cancer Neg Hx   . Esophageal cancer Neg Hx   . Stomach cancer Neg Hx     SOCIAL HISTORY: Social History   Tobacco Use  . Smoking status: Former Smoker    Packs/day: 0.25    Years: 10.00    Pack years: 2.50    Types: Cigarettes    Quit date: 05/2019    Years since quitting: 1.0  . Smokeless tobacco: Never Used  Substance Use Topics  . Alcohol use: Yes    Alcohol/week: 14.0 standard drinks    Types: 14 Cans of beer per week    Comment: 8 beers a week    Allergies  Allergen Reactions  . Trazodone And Nefazodone Nausea And Vomiting  . Wellbutrin [Bupropion] Nausea And Vomiting    Current Outpatient Medications  Medication Sig Dispense  Refill  . acetaminophen (TYLENOL) 325 MG tablet Take 2 tablets (650 mg total) by mouth every 4 (four) hours as needed for mild pain (or temp > 37.5 C (99.5 F)). (Patient not taking: No sig reported)    . amLODipine (NORVASC) 10 MG tablet Take 1 tablet (10 mg total) by mouth daily. 90 tablet 0  . aspirin EC 81 MG EC tablet Take 1 tablet (81 mg total) by mouth daily.    Marland Kitchen escitalopram (LEXAPRO) 20 MG tablet TAKE 1 TABLET(20 MG) BY MOUTH DAILY 90 tablet 3  . folic acid (FOLVITE) 1 MG tablet Take 1 tablet (1 mg total) by mouth daily. 90 tablet 1  . hydrALAZINE (APRESOLINE) 100 MG tablet Take 1 tablet (100 mg total) by mouth every 8 (eight) hours. 90 tablet 11  . hydrOXYzine (VISTARIL) 25 MG capsule Take 1 capsule (25 mg total) by mouth every 12 (twelve) hours as needed. 20 capsule 0  . isosorbide mononitrate (IMDUR) 30 MG 24 hr tablet TAKE 1 TABLET(30 MG)  BY MOUTH DAILY 90 tablet 3  . Multiple Vitamin (MULTIVITAMIN WITH MINERALS) TABS tablet Take 1 tablet by mouth daily.    . simvastatin (ZOCOR) 40 MG tablet Take 1 tablet (40 mg total) by mouth daily at 6 PM. 90 tablet 3   No current facility-administered medications for this visit.    REVIEW OF SYSTEMS:  [X]  denotes positive finding, [ ]  denotes negative finding Cardiac  Comments:  Chest pain or chest pressure:    Shortness of breath upon exertion:    Short of breath when lying flat:    Irregular heart rhythm:        Vascular    Pain in calf, thigh, or hip brought on by ambulation:    Pain in feet at night that wakes you up from your sleep:     Blood clot in your veins:    Leg swelling:         Pulmonary    Oxygen at home:    Productive cough:     Wheezing:         Neurologic    Sudden weakness in arms or legs:     Sudden numbness in arms or legs:     Sudden onset of difficulty speaking or slurred speech:    Temporary loss of vision in one eye:     Problems with dizziness:         Gastrointestinal    Blood in stool:     Vomited blood:         Genitourinary    Burning when urinating:     Blood in urine:        Psychiatric    Major depression:         Hematologic    Bleeding problems:    Problems with blood clotting too easily:        Skin    Rashes or ulcers:        Constitutional    Fever or chills:     PHYSICAL EXAM:   Vitals:   05/22/20 1318  BP: 126/71  Pulse: 73  Resp: 14  Temp: 98.4 F (36.9 C)  TempSrc: Temporal  SpO2: 97%  Weight: 102 lb (46.3 kg)  Height: 5\' 5"  (1.651 m)    GENERAL: The patient is a well-nourished female, in no acute distress. The vital signs are documented above. CARDIAC: There is a regular rate and rhythm.  VASCULAR: I do not detect carotid bruits. On  the right side she has palpable femoral, popliteal, and slightly diminished dorsalis pedis and posterior tibial pulse. On the left side she has a palpable femoral,  popliteal, dorsalis pedis, posterior tibial pulse. She has no significant lower extremity swelling. PULMONARY: There is good air exchange bilaterally without wheezing or rales. ABDOMEN: Soft and non-tender with normal pitched bowel sounds.  MUSCULOSKELETAL: There are no major deformities or cyanosis. NEUROLOGIC: No focal weakness or paresthesias are detected. SKIN: She has a rash on both legs where she has been scratching because of the allergic reaction she has been having. PSYCHIATRIC: The patient has a normal affect.  DATA:    ARTERIAL DOPPLER STUDY: I have independently interpreted her arterial Doppler study today.  On the right side there is a biphasic dorsalis pedis and posterior tibial signal.  ABI is 76%.  Toe pressures 82 mmHg.  On the left side there is a triphasic dorsalis pedis and posterior tibial signal.  ABI is 100%.  Toe pressures 129 mmHg.  Deitra Mayo [facility] [phone]

## 2020-05-24 ENCOUNTER — Other Ambulatory Visit: Payer: Self-pay | Admitting: Nurse Practitioner

## 2020-05-24 DIAGNOSIS — I10 Essential (primary) hypertension: Secondary | ICD-10-CM

## 2020-05-27 DIAGNOSIS — L308 Other specified dermatitis: Secondary | ICD-10-CM | POA: Diagnosis not present

## 2020-05-28 ENCOUNTER — Telehealth: Payer: Self-pay

## 2020-05-28 DIAGNOSIS — F411 Generalized anxiety disorder: Secondary | ICD-10-CM

## 2020-05-28 DIAGNOSIS — F3342 Major depressive disorder, recurrent, in full remission: Secondary | ICD-10-CM

## 2020-05-28 DIAGNOSIS — L859 Epidermal thickening, unspecified: Secondary | ICD-10-CM | POA: Diagnosis not present

## 2020-05-28 NOTE — Telephone Encounter (Signed)
Pt is requesting a refill for  escitalopram (LEXAPRO) 20 MG tablet  Pharmacy is [facility] on [address].

## 2020-05-29 NOTE — Telephone Encounter (Signed)
Pt called again to follow-up on this.

## 2020-05-29 NOTE — Telephone Encounter (Signed)
Never prescribed by me. Prescribed by Dr. Jethro Bolus Advise to contact provider

## 2020-05-31 NOTE — Telephone Encounter (Signed)
Pt's daughter Hector Brunswick (Daughter) at [phone] would like a cb about her mom's prescription. Please advise

## 2020-05-31 NOTE — Telephone Encounter (Signed)
Pt daughter states she is unable to get medication from that provider because he is in [place] and would like to know if this can be prescribed by you? Please advise

## 2020-05-31 NOTE — Telephone Encounter (Signed)
Left patient detailed message and informed her to call back with any questions or concerns.

## 2020-06-03 MED ORDER — ESCITALOPRAM OXALATE 20 MG PO TABS: ORAL_TABLET | ORAL | 1 refills | Status: DC

## 2020-06-03 NOTE — Addendum Note (Signed)
Addended by: Wilfred Lacy L on: 06/03/2020 11:06 AM   Modules accepted: Orders

## 2020-06-30 ENCOUNTER — Other Ambulatory Visit: Payer: Self-pay | Admitting: Nurse Practitioner

## 2020-06-30 DIAGNOSIS — I10 Essential (primary) hypertension: Secondary | ICD-10-CM

## 2020-07-09 ENCOUNTER — Other Ambulatory Visit: Payer: Self-pay

## 2020-07-09 DIAGNOSIS — I10 Essential (primary) hypertension: Secondary | ICD-10-CM

## 2020-07-09 MED ORDER — HYDRALAZINE HCL 100 MG PO TABS: 100.0000 mg | ORAL_TABLET | Freq: Three times a day (TID) | ORAL | 3 refills | Status: DC

## 2020-07-15 ENCOUNTER — Telehealth: Payer: Medicare Other | Admitting: Family Medicine

## 2020-07-22 ENCOUNTER — Telehealth: Payer: Self-pay

## 2020-07-22 NOTE — Telephone Encounter (Signed)
She has vomiting and diarrhea, going on for a couple of weeks. Pt had a virtual visit last week but she was not able to get on and did not speak with provider. Today her daughter is calling to reschedule her for another visit.  Pt will receive help with call this time.

## 2020-07-23 ENCOUNTER — Telehealth: Payer: Medicare Other | Admitting: Family Medicine

## 2020-08-01 ENCOUNTER — Other Ambulatory Visit: Payer: Self-pay | Admitting: Nurse Practitioner

## 2020-08-01 DIAGNOSIS — E782 Mixed hyperlipidemia: Secondary | ICD-10-CM

## 2020-08-01 DIAGNOSIS — I700 Atherosclerosis of aorta: Secondary | ICD-10-CM

## 2020-10-14 ENCOUNTER — Telehealth: Payer: Self-pay | Admitting: Nurse Practitioner

## 2020-10-14 NOTE — Progress Notes (Signed)
  Chronic Care Management   Outreach Note  10/14/2020 Name: Stacie [name] MRN: [id number] DOB: Jan 12, 1956  Referred by: [name], NP Reason for referral : No chief complaint on file.   An unsuccessful telephone outreach was attempted today. The patient was referred to the pharmacist for assistance with care management and care coordination.   Follow Up Plan:   Tatjana Dellinger Upstream Scheduler

## 2020-10-21 ENCOUNTER — Telehealth: Payer: Self-pay | Admitting: Nurse Practitioner

## 2020-10-21 NOTE — Chronic Care Management (AMB) (Signed)
  Chronic Care Management   Outreach Note  10/21/2020 Name: Mairim Piacentini MRN: [id number] DOB: [birth year]  Referred by: [name], NP Reason for referral : No chief complaint on file.   A second unsuccessful telephone outreach was attempted today. The patient was referred to pharmacist for assistance with care management and care coordination.  Follow Up Plan:   Tatjana Dellinger Upstream Scheduler

## 2020-10-24 ENCOUNTER — Telehealth: Payer: Medicare Other | Admitting: Physician Assistant

## 2020-10-24 DIAGNOSIS — R112 Nausea with vomiting, unspecified: Secondary | ICD-10-CM

## 2020-10-24 MED ORDER — ONDANSETRON 4 MG PO TBDP: 4.0000 mg | ORAL_TABLET | Freq: Three times a day (TID) | ORAL | 0 refills | Status: DC | PRN

## 2020-10-24 NOTE — Progress Notes (Signed)
I have spent 5 minutes in review of e-visit questionnaire, review and updating patient chart, medical decision making and response to patient.   Stacie [name] Cody Anthonee Gelin, PA-C    

## 2020-10-24 NOTE — Progress Notes (Signed)
E-Visit for Vomiting  We are sorry that you are not feeling well. Here is how we plan to help!  Based on what you have shared with me it looks like you could have just a Virus that is irritating your GI tract.  Vomiting is the forceful emptying of a portion of the stomach's content through the mouth.  Although nausea and vomiting can make you feel miserable, it's important to remember that these are not diseases, but rather symptoms of an underlying illness.  When we treat short term symptoms, we always caution that any symptoms that persist should be fully evaluated in a medical office.  I have prescribed a medication that will help alleviate your symptoms and allow you to stay hydrated:  Zofran 4 mg 1 tablet every 8 hours as needed for nausea and vomiting  We cannot prescribe Budesonide or manage Crohn's via e-visit so if your GI provider is unable to get you in and symptoms are not easing up with treatment above, you need to schedule an appointment with your PCP or be seen in person at local Urgent Care.   HOME CARE: Drink clear liquids.  This is very important! Dehydration (the lack of fluid) can lead to a serious complication.  Start off with 1 tablespoon every 5 minutes for 8 hours. You may begin eating bland foods after 8 hours without vomiting.  Start with saltine crackers, white bread, rice, mashed potatoes, applesauce. After 48 hours on a bland diet, you may resume a normal diet. Try to go to sleep.  Sleep often empties the stomach and relieves the need to vomit.  GET HELP RIGHT AWAY IF:  Your symptoms do not improve or worsen within 2 days after treatment. You have a fever for over 3 days. You cannot keep down fluids after trying the medication.  MAKE SURE YOU:  Understand these instructions. Will watch your condition. Will get help right away if you are not doing well or get worse.   Thank you for choosing an e-visit.  Your e-visit answers were reviewed by a board  certified advanced clinical practitioner to complete your personal care plan. Depending upon the condition, your plan could have included both over the counter or prescription medications.  Please review your pharmacy choice. Make sure the pharmacy is open so you can pick up prescription now. If there is a problem, you may contact your provider through [employer] and have the prescription routed to another pharmacy.  Your safety is important to [place]. If you have drug allergies check your prescription carefully.   For the next 24 hours you can use MyChart to ask questions about today's visit, request a non-urgent call back, or ask for a work or school excuse. You will get an email in the next two days asking about your experience. I hope that your e-visit has been valuable and will speed your recovery.

## 2020-10-28 ENCOUNTER — Telehealth: Payer: Self-pay | Admitting: Nurse Practitioner

## 2020-10-28 ENCOUNTER — Other Ambulatory Visit: Payer: Self-pay | Admitting: Nurse Practitioner

## 2020-10-28 DIAGNOSIS — I700 Atherosclerosis of aorta: Secondary | ICD-10-CM

## 2020-10-28 DIAGNOSIS — E782 Mixed hyperlipidemia: Secondary | ICD-10-CM

## 2020-10-28 NOTE — Telephone Encounter (Signed)
Spoke with patient and she states this has been taken care of.

## 2020-10-28 NOTE — Chronic Care Management (AMB) (Signed)
  Chronic Care Management   Outreach Note  10/28/2020 Name: Stacie [name] MRN: [id number] DOB: 11/22/1955  Referred by: [name], NP Reason for referral : No chief complaint on file.   Third unsuccessful telephone outreach was attempted today. The patient was referred to the pharmacist for assistance with care management and care coordination.   Follow Up Plan:   Tatjana Dellinger Upstream Scheduler

## 2020-10-28 NOTE — Progress Notes (Signed)
  Chronic Care Management   Note  10/28/2020 Name: Stacie [name] MRN: [id number] DOB: 1955/02/17  Stacie [name] is a 65 y.o. year old female who is a primary care patient of Nche, Charlene Brooke, NP. I reached out to Burna Forts by phone today in response to a referral sent by Stacie [name]'s PCP, Nche, Charlene Brooke, NP.   Stacie [name] was given information about Chronic Care Management services today including:  CCM service includes personalized support from designated clinical staff supervised by her physician, including individualized plan of care and coordination with other care providers 24/7 contact phone numbers for assistance for urgent and routine care needs. Service will only be billed when office clinical staff spend 20 minutes or more in a month to coordinate care. Only one practitioner may furnish and bill the service in a calendar month. The patient may stop CCM services at any time (effective at the end of the month) by phone call to the office staff.   Patient agreed to services and verbal consent obtained.   Follow up plan:   Tatjana Secretary/administrator

## 2020-11-04 ENCOUNTER — Encounter: Payer: Self-pay | Admitting: Gastroenterology

## 2020-11-04 ENCOUNTER — Ambulatory Visit: Payer: Medicare Other | Admitting: Gastroenterology

## 2020-11-04 VITALS — BP 140/70 | HR 76 | Ht 64.25 in | Wt 90.1 lb

## 2020-11-04 DIAGNOSIS — R634 Abnormal weight loss: Secondary | ICD-10-CM

## 2020-11-04 DIAGNOSIS — K52831 Collagenous colitis: Secondary | ICD-10-CM | POA: Diagnosis not present

## 2020-11-04 DIAGNOSIS — R110 Nausea: Secondary | ICD-10-CM | POA: Diagnosis not present

## 2020-11-04 DIAGNOSIS — K529 Noninfective gastroenteritis and colitis, unspecified: Secondary | ICD-10-CM

## 2020-11-04 MED ORDER — ONDANSETRON HCL 4 MG PO TABS: 4.0000 mg | ORAL_TABLET | Freq: Four times a day (QID) | ORAL | 1 refills | Status: DC | PRN

## 2020-11-04 MED ORDER — BUDESONIDE 3 MG PO CPEP: 9.0000 mg | ORAL_CAPSULE | Freq: Every day | ORAL | 2 refills | Status: DC

## 2020-11-04 NOTE — Progress Notes (Signed)
[facility] Progress Note  Chief Complaint: Chronic diarrhea and weight loss  Subjective  History: Stacie [name] sees me for return of diarrhea with weight loss.  She was last seen September 2020 after colonoscopy diagnosed collagenous colitis, and she was much improved on budesonide 9 mg daily.  Negative celiac testing at that time.  Of note, she had been diagnosed with collagenous colitis in 2014 while living in [place], but had not recalled that, and it was discovered in prior record review. After that last visit she slowly tapered off medicine in the next couple of months and did well until about 4 weeks ago, when she developed the slow return of watery diarrhea including nocturnal episodes, all this affecting her appetite and causing a 6 to 8 pound weight loss in the last several weeks.  She has had no recent travel, sick contacts or antibiotic use.  ROS: Cardiovascular:  no chest pain Respiratory: no dyspnea  The patient's Past Medical, Family and Social History were reviewed and are on file in the EMR. Past Medical History:  Diagnosis Date   Colitis    Hyperlipidemia    Hypertension    Stroke [facility] 04/2012   unknown type   Thyroid disease    History reviewed. No pertinent surgical history.  Objective:  Med list reviewed  Current Outpatient Medications:    acetaminophen (TYLENOL) 325 MG tablet, Take 2 tablets (650 mg total) by mouth every 4 (four) hours as needed for mild pain (or temp > 37.5 C (99.5 F))., Disp:  , Rfl:    amLODipine (NORVASC) 10 MG tablet, TAKE 1 TABLET(10 MG) BY MOUTH DAILY, Disp: 90 tablet, Rfl: 0   aspirin EC 81 MG EC tablet, Take 1 tablet (81 mg total) by mouth daily., Disp:  , Rfl:    budesonide (ENTOCORT EC) 3 MG 24 hr capsule, Take 3 capsules (9 mg total) by mouth daily., Disp: 90 capsule, Rfl: 2   escitalopram (LEXAPRO) 20 MG tablet, TAKE 1 TABLET(20 MG) BY MOUTH DAILY, Disp: 90 tablet, Rfl: 1   folic acid (FOLVITE) 1 MG tablet, Take 1 tablet (1  mg total) by mouth daily., Disp: 90 tablet, Rfl: 1   hydrALAZINE (APRESOLINE) 100 MG tablet, Take 1 tablet (100 mg total) by mouth every 8 (eight) hours., Disp: 180 tablet, Rfl: 3   hydrOXYzine (VISTARIL) 25 MG capsule, Take 1 capsule (25 mg total) by mouth every 12 (twelve) hours as needed., Disp: 20 capsule, Rfl: 0   isosorbide mononitrate (IMDUR) 30 MG 24 hr tablet, TAKE 1 TABLET(30 MG) BY MOUTH DAILY, Disp: 90 tablet, Rfl: 3   Multiple Vitamin (MULTIVITAMIN WITH MINERALS) TABS tablet, Take 1 tablet by mouth daily., Disp:  , Rfl:    ondansetron (ZOFRAN ODT) 4 MG disintegrating tablet, Take 1 tablet (4 mg total) by mouth every 8 (eight) hours as needed for nausea or vomiting., Disp: 20 tablet, Rfl: 0   ondansetron (ZOFRAN) 4 MG tablet, Take 1 tablet (4 mg total) by mouth every 6 (six) hours as needed for nausea or vomiting., Disp: 30 tablet, Rfl: 1   simvastatin (ZOCOR) 20 MG tablet, Take 1 tablet (20 mg total) by mouth daily. **Need appt before anymore refills given.** (Patient taking differently: Take 20 mg by mouth as needed. **Need appt before anymore refills given.**), Disp: 90 tablet, Rfl: 0   Vital signs in last 24 hrs: Vitals:   11/04/20 1435  BP: 140/70  Pulse: 76   Wt Readings from Last 3 Encounters:  11/04/20 90 lb 2 oz (40.9 kg)  05/22/20 102 lb (46.3 kg)  03/13/20 101 lb 3.2 oz (45.9 kg)    Weight down about 10 pounds from baseline  Physical Exam  She is very thin HEENT: sclera anicteric, oral mucosa moist without lesions Neck: supple, no thyromegaly, JVD or lymphadenopathy Cardiac: RRR without murmurs, S1S2 heard, no peripheral edema Pulm: clear to auscultation bilaterally, normal RR and effort noted Abdomen: soft, no tenderness, with active bowel sounds. No guarding or palpable hepatosplenomegaly.   Labs:   ___________________________________________ Radiologic  studies:   ____________________________________________ Other:   _____________________________________________ Assessment & Plan  Assessment: Encounter Diagnoses  Name Primary?   Chronic diarrhea Yes   Collagenous colitis    Weight loss    Nausea in adult    Return of diarrhea, most likely her collagenous colitis. Less likely infection  Plan: Resume budesonide 9 mg once daily. If works as well as before, we will probably continue it about 2 months, then slowly taper again and assess for return of symptoms. I asked her to send [place] a portal message or call in about 3 weeks with an update on symptoms.  She is having some nausea due to this flare and diarrhea, ondansetron was prescribed  As needed use of Imodium, especially at nighttime since the nocturnal diarrhea is affecting her sleep.  23 minutes were spent on this encounter (including chart review, history/exam, counseling/coordination of care, and documentation) > 50% of that time was spent on counseling and coordination of care.   Nelida Meuse III

## 2020-11-04 NOTE — Patient Instructions (Signed)
If you are age 65 or older, your body mass index should be between 23-30. Your Body mass index is 15.35 kg/m. If this is out of the aforementioned range listed, please consider follow up with your Primary Care Provider.  If you are age 38 or younger, your body mass index should be between 19-25. Your Body mass index is 15.35 kg/m. If this is out of the aformentioned range listed, please consider follow up with your Primary Care Provider.   __________________________________________________________  The [facility] providers would like to encourage you to use [facility] to communicate with providers for non-urgent requests or questions.  Due to long hold times on the telephone, sending your provider a message by [facility] may be a faster and more efficient way to get a response.  Please allow 48 business hours for a response.  Please remember that this is for non-urgent requests.   Please use Imodium as needed.   It was a pleasure to see you today!  Thank you for trusting me with your gastrointestinal care!

## 2020-11-18 ENCOUNTER — Other Ambulatory Visit: Payer: Self-pay | Admitting: Nurse Practitioner

## 2020-11-18 DIAGNOSIS — I10 Essential (primary) hypertension: Secondary | ICD-10-CM

## 2020-11-27 ENCOUNTER — Other Ambulatory Visit: Payer: Self-pay | Admitting: Nurse Practitioner

## 2020-11-27 DIAGNOSIS — I10 Essential (primary) hypertension: Secondary | ICD-10-CM

## 2020-11-27 DIAGNOSIS — I700 Atherosclerosis of aorta: Secondary | ICD-10-CM

## 2020-11-27 DIAGNOSIS — E782 Mixed hyperlipidemia: Secondary | ICD-10-CM

## 2020-11-27 NOTE — Telephone Encounter (Signed)
Pt requesting refill amlodipine and Simvastatin .

## 2020-11-28 ENCOUNTER — Telehealth: Payer: Self-pay | Admitting: Nurse Practitioner

## 2020-11-28 NOTE — Telephone Encounter (Signed)
Chart supports Rx Last seen 03/13/2020 Next OV 12/19/20

## 2020-11-28 NOTE — Telephone Encounter (Signed)
Closing encounter because it is a duplicate

## 2020-12-01 ENCOUNTER — Other Ambulatory Visit: Payer: Self-pay | Admitting: Nurse Practitioner

## 2020-12-01 DIAGNOSIS — F411 Generalized anxiety disorder: Secondary | ICD-10-CM

## 2020-12-01 DIAGNOSIS — F3342 Major depressive disorder, recurrent, in full remission: Secondary | ICD-10-CM

## 2020-12-11 ENCOUNTER — Other Ambulatory Visit: Payer: Self-pay

## 2020-12-11 DIAGNOSIS — F3342 Major depressive disorder, recurrent, in full remission: Secondary | ICD-10-CM

## 2020-12-11 DIAGNOSIS — F411 Generalized anxiety disorder: Secondary | ICD-10-CM

## 2020-12-11 MED ORDER — ESCITALOPRAM OXALATE 20 MG PO TABS: ORAL_TABLET | ORAL | 0 refills | Status: DC

## 2020-12-11 NOTE — Telephone Encounter (Signed)
Chart supports Rx Rx sent with and patient informed to keep upcoming appointment on 12/19/20.

## 2020-12-18 ENCOUNTER — Telehealth: Payer: Self-pay

## 2020-12-18 NOTE — Progress Notes (Signed)
    Chronic Care Management Pharmacy Assistant   Name: Stacie [name]  MRN: [id number] DOB: 1955/08/03  Chart Review for Clinical pharmacist for 12/18/2020 at 9:30 am.    Conditions to be addressed/monitored: CHF, HTN, HLD, Depression, Tobacco Use, and CVA, PAD, Subcortical infarction  Primary concerns for visit include: None ID   Recent office visits:  No recent office visit  Recent consult visits:  11/04/2020 Dr. Loletha Carrow MD (Gastroenterology) Start Budesonide 9 mg daily, start Ondansetron 4 mg PRN, Patient reported taking  Simvastatin to 20 mg daily.  Hospital visits:  None in previous 6 months  Left Voice message to do initial question prior to patient appointment on 12/19/2020 for CCM at 9:30 am with Junius Argyle the Clinical pharmacist.    Medications: Outpatient Encounter Medications as of 12/18/2020  Medication Sig   acetaminophen (TYLENOL) 325 MG tablet Take 2 tablets (650 mg total) by mouth every 4 (four) hours as needed for mild pain (or temp > 37.5 C (99.5 F)).   amLODipine (NORVASC) 10 MG tablet TAKE 1 TABLET(10 MG) BY MOUTH DAILY   aspirin EC 81 MG EC tablet Take 1 tablet (81 mg total) by mouth daily.   budesonide (ENTOCORT EC) 3 MG 24 hr capsule Take 3 capsules (9 mg total) by mouth daily.   escitalopram (LEXAPRO) 20 MG tablet TAKE 1 TABLET(20 MG) BY MOUTH DAILY   folic acid (FOLVITE) 1 MG tablet Take 1 tablet (1 mg total) by mouth daily.   hydrALAZINE (APRESOLINE) 100 MG tablet Take 1 tablet (100 mg total) by mouth every 8 (eight) hours.   hydrOXYzine (VISTARIL) 25 MG capsule Take 1 capsule (25 mg total) by mouth every 12 (twelve) hours as needed.   isosorbide mononitrate (IMDUR) 30 MG 24 hr tablet TAKE 1 TABLET(30 MG) BY MOUTH DAILY   Multiple Vitamin (MULTIVITAMIN WITH MINERALS) TABS tablet Take 1 tablet by mouth daily.   ondansetron (ZOFRAN ODT) 4 MG disintegrating tablet Take 1 tablet (4 mg total) by mouth every 8 (eight) hours as needed for nausea or  vomiting.   ondansetron (ZOFRAN) 4 MG tablet Take 1 tablet (4 mg total) by mouth every 6 (six) hours as needed for nausea or vomiting.   simvastatin (ZOCOR) 20 MG tablet TAKE 1 TABLET(20 MG) BY MOUTH DAILY.   No facility-administered encounter medications on file as of 12/18/2020.    Care Gaps: Covid-19 Vaccine  Tetanus Vaccine Shingrix Vaccine Pap smear-Modifier (Last Completed 06/04/2017) Pneumonia Vaccine Dexa scan Influenza Vaccine [facility]: Simvastatin 20 mg last filled 11/28/2020 for 90 day supply at [facility]. Medication Fill Gaps: None ID  Anderson Malta Clinical Pharmacist Assistant [phone]

## 2020-12-19 ENCOUNTER — Ambulatory Visit (INDEPENDENT_AMBULATORY_CARE_PROVIDER_SITE_OTHER): Payer: Medicare Other

## 2020-12-19 ENCOUNTER — Other Ambulatory Visit: Payer: Self-pay

## 2020-12-19 DIAGNOSIS — I63511 Cerebral infarction due to unspecified occlusion or stenosis of right middle cerebral artery: Secondary | ICD-10-CM

## 2020-12-19 DIAGNOSIS — E782 Mixed hyperlipidemia: Secondary | ICD-10-CM

## 2020-12-19 DIAGNOSIS — I10 Essential (primary) hypertension: Secondary | ICD-10-CM

## 2020-12-19 DIAGNOSIS — F3341 Major depressive disorder, recurrent, in partial remission: Secondary | ICD-10-CM

## 2020-12-19 DIAGNOSIS — I739 Peripheral vascular disease, unspecified: Secondary | ICD-10-CM

## 2020-12-19 NOTE — Addendum Note (Signed)
Addended by: Wilfred Lacy L on: 12/19/2020 03:49 PM   Modules accepted: Orders

## 2020-12-19 NOTE — Patient Instructions (Signed)
Visit Information It was great speaking with you today!  Please let me know if you have any questions about our visit.   Goals Addressed             This Visit's Progress    Track and Manage My Blood Pressure-Hypertension       Timeframe:  Long-Range Goal Priority:  High Start Date: 12/19/2020                            Expected End Date: 12/19/2021                      Follow Up within 90 days   - check blood pressure 3 times per week    Why is this important?   You won't feel high blood pressure, but it can still hurt your blood vessels.  High blood pressure can cause heart or kidney problems. It can also cause a stroke.  Making lifestyle changes like losing a little weight or eating less salt will help.  Checking your blood pressure at home and at different times of the day can help to control blood pressure.  If the doctor prescribes medicine remember to take it the way the doctor ordered.  Call the office if you cannot afford the medicine or if there are questions about it.     Notes:         Patient Care Plan: General Pharmacy (Adult)     Problem Identified: Hypertension, Hyperlipidemia, Depression, and Insomnia, History of PAD, History of Stroke   Priority: High     Long-Range Goal: Patient-Specific Goal   Start Date: 12/19/2020  Expected End Date: 12/19/2021  This Visit's Progress: On track  Priority: High  Note:   Current Barriers:  Unable to achieve control of sleep  Unable to achieve control of cholesterol   Pharmacist Clinical Goal(s):  Patient will achieve control of cholesterol as evidenced by LDL less than 70 achieve control of sleep as evidenced by patient report  through collaboration with PharmD and provider.   Interventions: 1:1 collaboration with Nche, Charlene Brooke, NP regarding development and update of comprehensive plan of care as evidenced by provider attestation and co-signature Inter-disciplinary care team collaboration (see  longitudinal plan of care) Comprehensive medication review performed; medication list updated in electronic medical record  Hypertension (BP goal <140/90) -Controlled -Current treatment: Amlodipine 10 mg daily  Hydralazine 100 mg every 8 hours  Imdur 30 mg daily  -Medications previously tried: Metoprolol  -Current home readings: Monitors 2-3 times weekly, unsure of what her readings are at home.  -Denies hypotensive/hypertensive symptoms -Recommended to continue current medication  Hyperlipidemia: (LDL goal < 70) -Uncontrolled -History of PAD history of Stroke April 2021, 2017  -Current treatment: Simvastatin 20 mg daily  -Current antiplatelet treatment: Aspirin 81 mg daily  -Medications previously tried: atorvastatin, lovaza, pravastatin, rosuvastatin -Unclear history with atorvastatin or rosuvastatin, patient thinks it may have caused nausea. May need to consider retrial of higher intensity statin to achieve LDL goal.  -Current dietary patterns: Fruits + vegetables.  -Current exercise habits: Yoga daily, walking, weight training.  -Recommended rechecking fasting lipid panel  Depression(Goal: Maintain symptom remission) -Uncontrolled -Current treatment: Escitalopram 20 mg daily  Hydroxyzine 25 mg every 12 hours as needed  -Medications previously tried/failed: Wellbutrin, Citalopram, mirtazapine -PHQ9: 0 -GAD7: 2 -Patient has struggled with significant itching that persists ever since having a mite infestation. This has been ongoing for  2 months. She was prescribed Levocetirizine and hydroxyzine but she has not been using that. -Recommended to continue current medication  Insomnia (Goal: Improve sleep quality) -Uncontrolled -Current treatment  Diphenhydramine 25 mg nightly  -Medications previously tried: Trazodone (GI), Melatonin (ineffective)   -Patient reports only 1-2 hours of sleep on night on average. Has a TV in her bedroom that she watches all night long. Reports  trazodone worked well but caused GI upset.  -Counseled patient on proper sleep hygiene, limiting diphenhydramine use  -Recommend retrial of trazodone at lower dose to see if GI symptoms return, or could consider Suvorexant 10 mg nightly for 4 week trial.   Patient Goals/Self-Care Activities Patient will:  - check blood pressure three times weekly, document, and provide at future appointments  Follow Up Plan: Face to Face appointment with care management team member scheduled for: 06/19/2020 at 11:00 AM       Ms. Nagorski was given information about Chronic Care Management services today including:  CCM service includes personalized support from designated clinical staff supervised by her physician, including individualized plan of care and coordination with other care providers 24/7 contact phone numbers for assistance for urgent and routine care needs. Standard insurance, coinsurance, copays and deductibles apply for chronic care management only during months in which we provide at least 20 minutes of these services. Most insurances cover these services at 100%, however patients may be responsible for any copay, coinsurance and/or deductible if applicable. This service may help you avoid the need for more expensive face-to-face services. Only one practitioner may furnish and bill the service in a calendar month. The patient may stop CCM services at any time (effective at the end of the month) by phone call to the office staff.  Patient agreed to services and verbal consent obtained.   Print copy of patient instructions, educational materials, and care plan provided in person.  Junius Argyle, PharmD, Para [place], CPP Clinical Pharmacist Practitioner  [facility]  [phone]

## 2020-12-19 NOTE — Progress Notes (Signed)
Chronic Care Management Pharmacy Note  12/19/2020 Name:  Stacie [name] MRN:  [id number] DOB:  03-10-1955  Summary: Patient presents for initial CCM consult. She has multiple concerns today.  -Patient has struggled with significant itching that persists ever since having a mite infestation.  -Patient reports only 1-2 hours of sleep on night on average. Has a TV in her bedroom that she watches all night long. Reports trazodone worked well but caused GI upset.  Recommendations/Changes made from today's visit: -PCP appointment set up to address issues. Will reach out to PCP to see if psychiatry referral can be placed today.   -Recommended rechecking fasting lipid panel. May need to consider retrial of higher intensity statin to achieve LDL goal.   -Recommend retrial of trazodone at lower dose to see if GI symptoms return, or could consider Suvorexant 10 mg nightly for 4 week trial.   Plan: CPP follow-up 6 months  Recommended Problem List Changes:  Add: Insomnia  Subjective: Stacie [name] is an 65 y.o. year old female who is a primary patient of Nche, Charlene Brooke, NP.  The CCM team was consulted for assistance with disease management and care coordination needs.    Engaged with patient by telephone for initial visit in response to provider referral for pharmacy case management and/or care coordination services.   Consent to Services:  The patient was given the following information about Chronic Care Management services today, agreed to services, and gave verbal consent: 1. CCM service includes personalized support from designated clinical staff supervised by the primary care provider, including individualized plan of care and coordination with other care providers 2. 24/7 contact phone numbers for assistance for urgent and routine care needs. 3. Service will only be billed when office clinical staff spend 20 minutes or more in a month to coordinate care. 4. Only  one practitioner may furnish and bill the service in a calendar month. 5.The patient may stop CCM services at any time (effective at the end of the month) by phone call to the office staff. 6. The patient will be responsible for cost sharing (co-pay) of up to 20% of the service fee (after annual deductible is met). Patient agreed to services and consent obtained.  Patient Care Team: Nche, Charlene Brooke, NP as PCP - General (Internal Medicine) Germaine Pomfret, [facility] as Pharmacist (Pharmacist)  Recent office visits: 03/13/20: Patient presented to Wilfred Lacy, NP for follow-up.  02/06/20: Patient presented to Caroleen Hamman, LPN for AWV.   Recent consult visits: 11/04/2020 Dr. Loletha Carrow MD (Gastroenterology) Start Budesonide 9 mg daily, start Ondansetron 4 mg PRN, Patient reported taking  Simvastatin to 20 mg daily.  Hospital visits: None in previous 6 months   Objective:  Lab Results  Component Value Date   CREATININE 0.82 03/13/2020   BUN 18 03/13/2020   GFR 75.41 03/13/2020   GFRNONAA >60 05/19/2019   GFRAA >60 05/19/2019   NA 140 03/13/2020   K 4.4 03/13/2020   CALCIUM 9.4 03/13/2020   CO2 28 03/13/2020   GLUCOSE 87 03/13/2020    Lab Results  Component Value Date/Time   HGBA1C 5.7 03/13/2020 12:17 PM   HGBA1C 5.5 05/05/2019 05:38 AM   GFR 75.41 03/13/2020 12:17 PM   GFR 69.20 06/20/2019 11:33 AM    Last diabetic Eye exam: No results found for: HMDIABEYEEXA  Last diabetic Foot exam: No results found for: HMDIABFOOTEX   Lab Results  Component Value Date   CHOL 173 03/13/2020   HDL 68.00  03/13/2020   LDLCALC 86 03/13/2020   TRIG 96.0 03/13/2020   CHOLHDL 3 03/13/2020    Hepatic Function Latest Ref Rng & Units 03/14/2020 05/15/2019 05/08/2019  Total Protein 6.0 - 8.3 g/dL 6.9 6.1(L) 7.3  Albumin 3.5 - 5.2 g/dL 4.5 3.6 4.0  AST 0 - 37 U/L 27 33 27  ALT 0 - 35 U/L 19 34 21  Alk Phosphatase 39 - 117 U/L 88 51 74  Total Bilirubin 0.2 - 1.2 mg/dL 0.3 0.2(L) 0.9  Bilirubin,  Direct 0.0 - 0.3 mg/dL 0.1 - -    Lab Results  Component Value Date/Time   TSH 2.46 03/13/2020 12:17 PM   TSH 1.609 05/04/2019 03:36 PM   TSH 1.44 06/17/2018 12:53 PM   FREET4 1.2 06/17/2018 12:53 PM   FREET4 0.61 10/18/2017 02:02 PM    CBC Latest Ref Rng & Units 03/13/2020 05/15/2019 05/12/2019  WBC 4.0 - 10.5 K/uL 7.3 6.6 8.1  Hemoglobin 12.0 - 15.0 g/dL 12.2 12.2 14.0  Hematocrit 36.0 - 46.0 % 37.4 38.8 43.7  Platelets 150.0 - 400.0 K/uL 306.0 315 361    No results found for: VD25OH  Clinical ASCVD: Yes  The ASCVD Risk score (Arnett DK, et al., 2019) failed to calculate for the following reasons:   The patient has a prior MI or stroke diagnosis    Depression screen [facility] 2/9 02/06/2020 06/20/2019 10/18/2017  Decreased Interest 0 0 0  Down, Depressed, Hopeless 0 0 0  PHQ - 2 Score 0 0 0  Altered sleeping - 0 0  Tired, decreased energy - 1 0  Change in appetite - 0 0  Feeling bad or failure about yourself  - 0 0  Trouble concentrating - 1 1  Moving slowly or fidgety/restless - 1 0  Suicidal thoughts - 0 0  PHQ-9 Score - 3 1  Difficult doing work/chores - - -    Social History   Tobacco Use  Smoking Status Former   Packs/day: 0.25   Years: 10.00   Pack years: 2.50   Types: Cigarettes   Quit date: 05/2019   Years since quitting: 1.6  Smokeless Tobacco Never   BP Readings from Last 3 Encounters:  11/04/20 140/70  05/22/20 126/71  03/13/20 134/60   Pulse Readings from Last 3 Encounters:  11/04/20 76  05/22/20 73  03/13/20 87   Wt Readings from Last 3 Encounters:  11/04/20 90 lb 2 oz (40.9 kg)  05/22/20 102 lb (46.3 kg)  03/13/20 101 lb 3.2 oz (45.9 kg)   BMI Readings from Last 3 Encounters:  11/04/20 15.35 kg/m  05/22/20 16.97 kg/m  03/13/20 17.37 kg/m    Assessment/Interventions: Review of patient past medical history, allergies, medications, health status, including review of consultants reports, laboratory and other test data, was performed as part of  comprehensive evaluation and provision of chronic care management services.   SDOH:  (Social Determinants of Health) assessments and interventions performed: Yes SDOH Interventions    Flowsheet Row Most Recent Value  SDOH Interventions   Financial Strain Interventions Intervention Not Indicated      SDOH Screenings   Alcohol Screen: Low Risk    Last Alcohol Screening Score (AUDIT): 1  Depression (PHQ2-9): Low Risk    PHQ-2 Score: 0  Financial Resource Strain: Low Risk    Difficulty of Paying Living Expenses: Not hard at all  Food Insecurity: No Food Insecurity   Worried About Charity fundraiser in the Last Year: Never true   Ran  Out of Food in the Last Year: Never true  Housing: Low Risk    Last Housing Risk Score: 0  Physical Activity: Inactive   Days of Exercise per Week: 0 days   Minutes of Exercise per Session: 0 min  Social Connections: Socially Isolated   Frequency of Communication with Friends and Family: More than three times a week   Frequency of Social Gatherings with Friends and Family: More than three times a week   Attends Religious Services: Never   Marine scientist or Organizations: No   Attends Music therapist: Never   Marital Status: Divorced  Stress: No Stress Concern Present   Feeling of Stress : Not at all  Tobacco Use: Medium Risk   Smoking Tobacco Use: Former   Smokeless Tobacco Use: Never   Passive Exposure: Not on Pensions consultant Needs: No Transportation Needs   Lack of Transportation (Medical): No   Lack of Transportation (Non-Medical): No    CCM Care Plan  Allergies  Allergen Reactions   Trazodone And Nefazodone Nausea And Vomiting   Wellbutrin [Bupropion] Nausea And Vomiting    Medications Reviewed Today     Reviewed by Germaine Pomfret, [facility] (Pharmacist) on 12/19/20 at [phone]  Med List Status: <None>   Medication Order Taking? Sig Documenting Provider Last Dose Status Informant  acetaminophen (TYLENOL) 325  MG tablet [id number] Yes Take 2 tablets (650 mg total) by mouth every 4 (four) hours as needed for mild pain (or temp > 37.5 C (99.5 F)). Cathlyn Parsons, PA-C Taking Active   amLODipine (NORVASC) 10 MG tablet [id number] Yes TAKE 1 TABLET(10 MG) BY MOUTH DAILY Nche, Charlene Brooke, NP Taking Active   aspirin EC 81 MG EC tablet [id number] Yes Take 1 tablet (81 mg total) by mouth daily. Damita Lack, MD Taking Active   budesonide (ENTOCORT EC) 3 MG 24 hr capsule [id number] No Take 3 capsules (9 mg total) by mouth daily.  Patient not taking: Reported on 12/19/2020   Doran Stabler, MD Not Taking Active   diphenhydrAMINE (BENADRYL) 25 MG tablet [id number] Yes Take 25 mg by mouth at bedtime as needed. [provider] Taking Active   escitalopram (LEXAPRO) 20 MG tablet [id number] Yes TAKE 1 TABLET(20 MG) BY MOUTH DAILY Nche, Charlene Brooke, NP Taking Active   hydrALAZINE (APRESOLINE) 100 MG tablet [id number] Yes Take 1 tablet (100 mg total) by mouth every 8 (eight) hours.  Patient taking differently: Take 100 mg by mouth 2 (two) times daily.   Nche, Charlene Brooke, NP Taking Active   hydrOXYzine (VISTARIL) 25 MG capsule [id number] No Take 1 capsule (25 mg total) by mouth every 12 (twelve) hours as needed.  Patient not taking: Reported on 12/19/2020   Nche, Charlene Brooke, NP Not Taking Active   isosorbide mononitrate (IMDUR) 30 MG 24 hr tablet [id number] Yes TAKE 1 TABLET(30 MG) BY MOUTH DAILY Nche, Charlene Brooke, NP Taking Active   Multiple Vitamin (MULTIVITAMIN WITH MINERALS) TABS tablet [id number] Yes Take 1 tablet by mouth daily. Damita Lack, MD Taking Active     Discontinued 12/19/20 [phone] (Completed Course)     Discontinued 12/19/20 0944 (Completed Course)   simvastatin (ZOCOR) 20 MG tablet [id number] Yes TAKE 1 TABLET(20 MG) BY MOUTH DAILY. 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   Underweight 05/05/2019   Depression 05/05/2019   Tobacco abuse 05/05/2019   Marijuana use 05/05/2019   Alcohol abuse 05/05/2019   Chronic diastolic CHF (congestive heart failure) ([place]) 05/05/2019   CVA (cerebral vascular accident) ([place]) 05/04/2019   Chronic diarrhea 08/26/2018   Loss of weight 08/26/2018   PAD (peripheral artery disease) ([place]) 12/06/2017   Claudication of right lower extremity ([place]) 12/06/2017   Hyperlipidemia 02/28/2016   Essential hypertension 02/21/2016   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  Conditions to be addressed/monitored:  Hypertension, Hyperlipidemia, Depression, and Insomnia, History of PAD, History of Stroke  Care Plan : General Pharmacy (Adult)  Updates made by Germaine Pomfret, RPH since 12/19/2020 12:00 AM     Problem: Hypertension, Hyperlipidemia, Depression, and Insomnia, History of PAD, History of Stroke   Priority: High     Long-Range Goal: Patient-Specific Goal   Start Date: 12/19/2020  Expected End Date: 12/19/2021  This Visit's Progress: On track  Priority: High  Note:   Current Barriers:  Unable to achieve control of sleep  Unable to achieve control of cholesterol   Pharmacist Clinical Goal(s):  Patient will achieve control of cholesterol as evidenced by LDL less than 70 achieve control of sleep as evidenced by patient report  through collaboration with PharmD and provider.   Interventions: 1:1 collaboration with Nche, Charlene Brooke, NP regarding development and update of comprehensive plan of care as evidenced by provider attestation and co-signature Inter-disciplinary care  team collaboration (see longitudinal plan of care) Comprehensive medication review performed; medication list updated in electronic medical record  Hypertension (BP goal <140/90) -Controlled -Current treatment: Amlodipine 10 mg daily  Hydralazine 100 mg every 8 hours  Imdur 30 mg daily  -Medications previously tried: Metoprolol  -Current home readings: Monitors 2-3 times weekly, unsure of what her readings are at home.  -Denies hypotensive/hypertensive symptoms -Recommended to continue current medication  Hyperlipidemia: (LDL goal < 70) -Uncontrolled -History of PAD history of Stroke April 2021, 2017  -Current treatment: Simvastatin 20 mg daily  -Current antiplatelet treatment: Aspirin 81 mg daily  -Medications previously tried: atorvastatin, lovaza, pravastatin, rosuvastatin -Unclear history with atorvastatin or rosuvastatin, patient thinks it may have caused nausea. May need to consider retrial of higher intensity statin to achieve LDL goal.  -Current dietary patterns: Fruits + vegetables.  -Current exercise habits: Yoga daily, walking, weight training.  -Recommended rechecking fasting lipid panel  Depression(Goal: Maintain symptom remission) -Uncontrolled -Current treatment: Escitalopram 20 mg daily  Hydroxyzine 25 mg every 12 hours as needed  -Medications previously tried/failed: Wellbutrin, Citalopram, mirtazapine -PHQ9: 0 -GAD7: 2 -Patient has struggled with significant itching that persists ever since having a mite infestation. This has been ongoing for 2 months. She was prescribed Levocetirizine and hydroxyzine but she has not been using that. -Recommended to continue current medication  Insomnia (Goal: Improve sleep quality) -Uncontrolled -Current treatment  Diphenhydramine 25 mg nightly  -Medications previously tried: Trazodone (GI), Melatonin (ineffective)   -Patient reports only 1-2 hours of sleep on night on average. Has a TV in her bedroom that she watches  all night long. Reports trazodone worked well but caused GI upset.  -Counseled patient on proper sleep hygiene, limiting diphenhydramine use  -Recommend retrial of  trazodone at lower dose to see if GI symptoms return, or could consider Suvorexant 10 mg nightly for 4 week trial.   Patient Goals/Self-Care Activities Patient will:  - check blood pressure three times weekly, document, and provide at future appointments  Follow Up Plan: Face to Face appointment with care management team member scheduled for: 06/19/2020 at 11:00 AM     Medication Assistance: None required.  Patient affirms current coverage meets needs.  Compliance/Adherence/Medication fill history: Care Gaps: Covid-19 Vaccine  Tetanus Vaccine Shingrix Vaccine Pap smear-Modifier (Last Completed 06/04/2017) Pneumonia Vaccine Dexa scan Influenza Vaccine  Star-Rating Drugs: Simvastatin 20 mg last filled 11/28/2020 for 90 day supply at [facility].  Patient's preferred pharmacy is:  [id number] [address], [address] [place] [address] Phone: [phone] Fax: [phone]  Uses pill box? Yes Pt endorses 100% compliance  We discussed: Current pharmacy is preferred with insurance plan and patient is satisfied with pharmacy services Patient decided to: Continue current medication management strategy  Care Plan and Follow Up Patient Decision:  Patient agrees to Care Plan and Follow-up.  Plan: Face to Face appointment with care management team member scheduled for: 06/19/2020 at 11:00 AM  Junius Argyle, PharmD, Para [place], CPP Clinical Pharmacist Practitioner  [facility]  [phone]

## 2021-01-01 DIAGNOSIS — E782 Mixed hyperlipidemia: Secondary | ICD-10-CM

## 2021-01-01 DIAGNOSIS — I10 Essential (primary) hypertension: Secondary | ICD-10-CM

## 2021-01-01 DIAGNOSIS — I63511 Cerebral infarction due to unspecified occlusion or stenosis of right middle cerebral artery: Secondary | ICD-10-CM | POA: Diagnosis not present

## 2021-01-01 DIAGNOSIS — F3341 Major depressive disorder, recurrent, in partial remission: Secondary | ICD-10-CM

## 2021-01-01 NOTE — Telephone Encounter (Signed)
Duplicate

## 2021-01-16 ENCOUNTER — Telehealth: Payer: Self-pay

## 2021-01-16 NOTE — Progress Notes (Signed)
Chronic Care Management Pharmacy Assistant   Name: Stacie [name]  MRN: [id number] DOB: [birth year]  Reason for Encounter: Hypertension Disease State Call.   Recent office visits:  No recent office visit  Recent consult visits:  No recent [facility] visits:  None in previous 6 months  Medications: Outpatient Encounter Medications as of 01/16/2021  Medication Sig   acetaminophen (TYLENOL) 325 MG tablet Take 2 tablets (650 mg total) by mouth every 4 (four) hours as needed for mild pain (or temp > 37.5 C (99.5 F)).   amLODipine (NORVASC) 10 MG tablet TAKE 1 TABLET(10 MG) BY MOUTH DAILY   aspirin EC 81 MG EC tablet Take 1 tablet (81 mg total) by mouth daily.   budesonide (ENTOCORT EC) 3 MG 24 hr capsule Take 3 capsules (9 mg total) by mouth daily. (Patient not taking: Reported on 12/19/2020)   diphenhydrAMINE (BENADRYL) 25 MG tablet Take 25 mg by mouth at bedtime as needed.   escitalopram (LEXAPRO) 20 MG tablet TAKE 1 TABLET(20 MG) BY MOUTH DAILY   hydrALAZINE (APRESOLINE) 100 MG tablet Take 1 tablet (100 mg total) by mouth every 8 (eight) hours. (Patient taking differently: Take 100 mg by mouth 2 (two) times daily.)   hydrOXYzine (VISTARIL) 25 MG capsule Take 1 capsule (25 mg total) by mouth every 12 (twelve) hours as needed. (Patient not taking: Reported on 12/19/2020)   isosorbide mononitrate (IMDUR) 30 MG 24 hr tablet TAKE 1 TABLET(30 MG) BY MOUTH DAILY   Multiple Vitamin (MULTIVITAMIN WITH MINERALS) TABS tablet Take 1 tablet by mouth daily.   simvastatin (ZOCOR) 20 MG tablet TAKE 1 TABLET(20 MG) BY MOUTH DAILY.   No facility-administered encounter medications on file as of 01/16/2021.    Care Gaps: Covid-19 Vaccine  Tetanus Vaccine Shingrix Vaccine Pap smear-Modifier (Last Completed 06/04/2017) Pneumonia Vaccine Dexa scan Influenza Vaccine Star Rating Drugs: Simvastatin 20 mg last filled 11/28/2020 for 90 day supply at [facility]. Medication Fill Gaps: None ID  Reviewed chart prior to disease state call. Spoke with patient regarding BP  Recent Office Vitals: BP Readings from Last 3 Encounters:  11/04/20 140/70  05/22/20 126/71  03/13/20 134/60   Pulse Readings from Last 3 Encounters:  11/04/20 76  05/22/20 73  03/13/20 87    Wt Readings from Last 3 Encounters:  11/04/20 90 lb 2 oz (40.9 kg)  05/22/20 102 lb (46.3 kg)  03/13/20 101 lb 3.2 oz (45.9 kg)     Kidney Function Lab Results  Component Value Date/Time   CREATININE 0.82 03/13/2020 12:17 PM   CREATININE 0.83 06/20/2019 11:33 AM   CREATININE 0.89 06/17/2018 12:53 PM   CREATININE 0.74 06/04/2017 02:34 PM   GFR 75.41 03/13/2020 12:17 PM   GFRNONAA >60 05/19/2019 05:47 AM   GFRAA >60 05/19/2019 05:47 AM    BMP Latest Ref Rng & Units 03/13/2020 06/20/2019 05/19/2019  Glucose 70 - 99 mg/dL 87 91 -  BUN 6 - 23 mg/dL 18 15 -  Creatinine 0.40 - 1.20 mg/dL 0.82 0.83 0.75  BUN/Creat Ratio 6 - 22 (calc) - - -  Sodium 135 - 145 mEq/L 140 141 -  Potassium 3.5 - 5.1 mEq/L 4.4 4.3 -  Chloride 96 - 112 mEq/L 103 106 -  CO2 19 - 32 mEq/L 28 28 -  Calcium 8.4 - 10.5 mg/dL 9.4 9.4 -    Current antihypertensive regimen:  Amlodipine 10 mg daily  Hydralazine 100 mg every 8 hours  Imdur 30 mg daily  What recent interventions/DTPs have been made by any provider to improve Blood Pressure control since last CPP Visit: None  Any recent hospitalizations or ED visits since last visit with CPP? No  I have attempted without success to contact this patient by phone three times to do her hypertension Disease State call. I left a Voice message for patient to return my call.   Adherence Review: Is the patient currently on ACE/ARB medication? No Does the patient have >5 day gap between last estimated fill dates? No  Face to Face appointment with care management team member scheduled for: 06/19/2020 at 11:00 AM  [place] Pharmacist  Assistant [phone]

## 2021-02-05 ENCOUNTER — Ambulatory Visit: Payer: Medicare Other | Admitting: Nurse Practitioner

## 2021-02-24 ENCOUNTER — Other Ambulatory Visit: Payer: Self-pay | Admitting: Nurse Practitioner

## 2021-02-24 DIAGNOSIS — E782 Mixed hyperlipidemia: Secondary | ICD-10-CM

## 2021-02-24 DIAGNOSIS — I700 Atherosclerosis of aorta: Secondary | ICD-10-CM

## 2021-02-24 DIAGNOSIS — I10 Essential (primary) hypertension: Secondary | ICD-10-CM

## 2021-03-04 ENCOUNTER — Other Ambulatory Visit: Payer: Self-pay | Admitting: Nurse Practitioner

## 2021-03-04 DIAGNOSIS — E782 Mixed hyperlipidemia: Secondary | ICD-10-CM

## 2021-03-04 DIAGNOSIS — I700 Atherosclerosis of aorta: Secondary | ICD-10-CM

## 2021-03-09 ENCOUNTER — Other Ambulatory Visit: Payer: Self-pay | Admitting: Nurse Practitioner

## 2021-03-09 DIAGNOSIS — F411 Generalized anxiety disorder: Secondary | ICD-10-CM

## 2021-03-09 DIAGNOSIS — F3342 Major depressive disorder, recurrent, in full remission: Secondary | ICD-10-CM

## 2021-03-11 NOTE — Telephone Encounter (Signed)
I called pt and left a vm letting her know she will need to see Baldo Ash before she gets another refill.

## 2021-03-17 ENCOUNTER — Other Ambulatory Visit: Payer: Self-pay | Admitting: Nurse Practitioner

## 2021-03-17 DIAGNOSIS — E782 Mixed hyperlipidemia: Secondary | ICD-10-CM

## 2021-03-17 DIAGNOSIS — I700 Atherosclerosis of aorta: Secondary | ICD-10-CM

## 2021-03-17 NOTE — Telephone Encounter (Signed)
Verbal given to pharmacy, Rx was sent on 02/25/21 and pharmacy will inform patient Rx is ready and appointment is needed for additional refills.

## 2021-03-19 ENCOUNTER — Inpatient Hospital Stay (HOSPITAL_COMMUNITY)
Admission: EM | Admit: 2021-03-19 | Discharge: 2021-03-28 | DRG: 480 | Disposition: A | Discharge status: Skilled Nursing Facility | Payer: Medicare Other | Attending: Internal Medicine | Admitting: Internal Medicine

## 2021-03-19 ENCOUNTER — Emergency Department (HOSPITAL_COMMUNITY): Payer: Medicare Other

## 2021-03-19 ENCOUNTER — Other Ambulatory Visit: Payer: Self-pay

## 2021-03-19 ENCOUNTER — Encounter (HOSPITAL_COMMUNITY): Payer: Self-pay | Admitting: *Deleted

## 2021-03-19 DIAGNOSIS — Y92009 Unspecified place in unspecified non-institutional (private) residence as the place of occurrence of the external cause: Secondary | ICD-10-CM | POA: Diagnosis not present

## 2021-03-19 DIAGNOSIS — F10239 Alcohol dependence with withdrawal, unspecified: Secondary | ICD-10-CM | POA: Diagnosis present

## 2021-03-19 DIAGNOSIS — E782 Mixed hyperlipidemia: Secondary | ICD-10-CM

## 2021-03-19 DIAGNOSIS — Z8249 Family history of ischemic heart disease and other diseases of the circulatory system: Secondary | ICD-10-CM | POA: Diagnosis not present

## 2021-03-19 DIAGNOSIS — Z20822 Contact with and (suspected) exposure to covid-19: Secondary | ICD-10-CM | POA: Diagnosis not present

## 2021-03-19 DIAGNOSIS — S72141A Displaced intertrochanteric fracture of right femur, initial encounter for closed fracture: Secondary | ICD-10-CM | POA: Diagnosis not present

## 2021-03-19 DIAGNOSIS — F1091 Alcohol use, unspecified, in remission: Secondary | ICD-10-CM | POA: Diagnosis present

## 2021-03-19 DIAGNOSIS — I5032 Chronic diastolic (congestive) heart failure: Secondary | ICD-10-CM | POA: Diagnosis not present

## 2021-03-19 DIAGNOSIS — G319 Degenerative disease of nervous system, unspecified: Secondary | ICD-10-CM | POA: Diagnosis not present

## 2021-03-19 DIAGNOSIS — S79929A Unspecified injury of unspecified thigh, initial encounter: Secondary | ICD-10-CM | POA: Diagnosis not present

## 2021-03-19 DIAGNOSIS — Z7401 Bed confinement status: Secondary | ICD-10-CM | POA: Diagnosis not present

## 2021-03-19 DIAGNOSIS — R5381 Other malaise: Secondary | ICD-10-CM

## 2021-03-19 DIAGNOSIS — Z87898 Personal history of other specified conditions: Secondary | ICD-10-CM | POA: Diagnosis present

## 2021-03-19 DIAGNOSIS — S72009A Fracture of unspecified part of neck of unspecified femur, initial encounter for closed fracture: Secondary | ICD-10-CM | POA: Insufficient documentation

## 2021-03-19 DIAGNOSIS — K529 Noninfective gastroenteritis and colitis, unspecified: Secondary | ICD-10-CM | POA: Diagnosis present

## 2021-03-19 DIAGNOSIS — S72141D Displaced intertrochanteric fracture of right femur, subsequent encounter for closed fracture with routine healing: Secondary | ICD-10-CM | POA: Diagnosis not present

## 2021-03-19 DIAGNOSIS — I509 Heart failure, unspecified: Secondary | ICD-10-CM | POA: Diagnosis not present

## 2021-03-19 DIAGNOSIS — S72112A Displaced fracture of greater trochanter of left femur, initial encounter for closed fracture: Secondary | ICD-10-CM | POA: Diagnosis not present

## 2021-03-19 DIAGNOSIS — I69319 Unspecified symptoms and signs involving cognitive functions following cerebral infarction: Secondary | ICD-10-CM

## 2021-03-19 DIAGNOSIS — D75839 Thrombocytosis, unspecified: Secondary | ICD-10-CM | POA: Diagnosis not present

## 2021-03-19 DIAGNOSIS — E611 Iron deficiency: Secondary | ICD-10-CM | POA: Diagnosis present

## 2021-03-19 DIAGNOSIS — E876 Hypokalemia: Secondary | ICD-10-CM | POA: Diagnosis not present

## 2021-03-19 DIAGNOSIS — G9340 Encephalopathy, unspecified: Secondary | ICD-10-CM | POA: Diagnosis not present

## 2021-03-19 DIAGNOSIS — E785 Hyperlipidemia, unspecified: Secondary | ICD-10-CM | POA: Diagnosis not present

## 2021-03-19 DIAGNOSIS — B86 Scabies: Secondary | ICD-10-CM | POA: Diagnosis not present

## 2021-03-19 DIAGNOSIS — E43 Unspecified severe protein-calorie malnutrition: Secondary | ICD-10-CM | POA: Diagnosis present

## 2021-03-19 DIAGNOSIS — F1010 Alcohol abuse, uncomplicated: Secondary | ICD-10-CM | POA: Diagnosis present

## 2021-03-19 DIAGNOSIS — I739 Peripheral vascular disease, unspecified: Secondary | ICD-10-CM | POA: Diagnosis present

## 2021-03-19 DIAGNOSIS — R609 Edema, unspecified: Secondary | ICD-10-CM

## 2021-03-19 DIAGNOSIS — Z681 Body mass index (BMI) 19 or less, adult: Secondary | ICD-10-CM

## 2021-03-19 DIAGNOSIS — Z888 Allergy status to other drugs, medicaments and biological substances status: Secondary | ICD-10-CM | POA: Diagnosis not present

## 2021-03-19 DIAGNOSIS — R410 Disorientation, unspecified: Secondary | ICD-10-CM | POA: Diagnosis not present

## 2021-03-19 DIAGNOSIS — Y905 Blood alcohol level of 100-119 mg/100 ml: Secondary | ICD-10-CM | POA: Diagnosis present

## 2021-03-19 DIAGNOSIS — I10 Essential (primary) hypertension: Secondary | ICD-10-CM | POA: Diagnosis present

## 2021-03-19 DIAGNOSIS — Z8781 Personal history of (healed) traumatic fracture: Secondary | ICD-10-CM

## 2021-03-19 DIAGNOSIS — E079 Disorder of thyroid, unspecified: Secondary | ICD-10-CM | POA: Diagnosis not present

## 2021-03-19 DIAGNOSIS — S72144D Nondisplaced intertrochanteric fracture of right femur, subsequent encounter for closed fracture with routine healing: Secondary | ICD-10-CM | POA: Diagnosis not present

## 2021-03-19 DIAGNOSIS — G9341 Metabolic encephalopathy: Secondary | ICD-10-CM

## 2021-03-19 DIAGNOSIS — S7290XA Unspecified fracture of unspecified femur, initial encounter for closed fracture: Secondary | ICD-10-CM

## 2021-03-19 DIAGNOSIS — I110 Hypertensive heart disease with heart failure: Secondary | ICD-10-CM | POA: Diagnosis not present

## 2021-03-19 DIAGNOSIS — R2681 Unsteadiness on feet: Secondary | ICD-10-CM | POA: Diagnosis not present

## 2021-03-19 DIAGNOSIS — S72001A Fracture of unspecified part of neck of right femur, initial encounter for closed fracture: Secondary | ICD-10-CM

## 2021-03-19 DIAGNOSIS — M7989 Other specified soft tissue disorders: Secondary | ICD-10-CM | POA: Diagnosis not present

## 2021-03-19 DIAGNOSIS — Z87891 Personal history of nicotine dependence: Secondary | ICD-10-CM | POA: Diagnosis not present

## 2021-03-19 DIAGNOSIS — I6990 Unspecified sequelae of unspecified cerebrovascular disease: Secondary | ICD-10-CM | POA: Diagnosis not present

## 2021-03-19 DIAGNOSIS — S0990XA Unspecified injury of head, initial encounter: Secondary | ICD-10-CM | POA: Diagnosis not present

## 2021-03-19 DIAGNOSIS — M6281 Muscle weakness (generalized): Secondary | ICD-10-CM | POA: Diagnosis not present

## 2021-03-19 DIAGNOSIS — Z66 Do not resuscitate: Secondary | ICD-10-CM | POA: Diagnosis present

## 2021-03-19 DIAGNOSIS — L03115 Cellulitis of right lower limb: Secondary | ICD-10-CM | POA: Diagnosis not present

## 2021-03-19 DIAGNOSIS — Z7189 Other specified counseling: Secondary | ICD-10-CM | POA: Diagnosis not present

## 2021-03-19 DIAGNOSIS — Z823 Family history of stroke: Secondary | ICD-10-CM

## 2021-03-19 DIAGNOSIS — Z09 Encounter for follow-up examination after completed treatment for conditions other than malignant neoplasm: Secondary | ICD-10-CM

## 2021-03-19 DIAGNOSIS — R1310 Dysphagia, unspecified: Secondary | ICD-10-CM | POA: Diagnosis not present

## 2021-03-19 DIAGNOSIS — Z9889 Other specified postprocedural states: Secondary | ICD-10-CM

## 2021-03-19 DIAGNOSIS — D62 Acute posthemorrhagic anemia: Secondary | ICD-10-CM | POA: Diagnosis not present

## 2021-03-19 DIAGNOSIS — R600 Localized edema: Secondary | ICD-10-CM | POA: Diagnosis not present

## 2021-03-19 DIAGNOSIS — Z515 Encounter for palliative care: Secondary | ICD-10-CM | POA: Diagnosis not present

## 2021-03-19 DIAGNOSIS — S8990XA Unspecified injury of unspecified lower leg, initial encounter: Secondary | ICD-10-CM | POA: Diagnosis not present

## 2021-03-19 DIAGNOSIS — R9082 White matter disease, unspecified: Secondary | ICD-10-CM | POA: Diagnosis not present

## 2021-03-19 DIAGNOSIS — R32 Unspecified urinary incontinence: Secondary | ICD-10-CM | POA: Diagnosis present

## 2021-03-19 DIAGNOSIS — W19XXXA Unspecified fall, initial encounter: Secondary | ICD-10-CM | POA: Diagnosis not present

## 2021-03-19 DIAGNOSIS — S72002A Fracture of unspecified part of neck of left femur, initial encounter for closed fracture: Secondary | ICD-10-CM | POA: Diagnosis present

## 2021-03-19 DIAGNOSIS — Z79899 Other long term (current) drug therapy: Secondary | ICD-10-CM

## 2021-03-19 DIAGNOSIS — E538 Deficiency of other specified B group vitamins: Secondary | ICD-10-CM | POA: Diagnosis present

## 2021-03-19 DIAGNOSIS — R197 Diarrhea, unspecified: Secondary | ICD-10-CM | POA: Diagnosis not present

## 2021-03-19 DIAGNOSIS — S72111A Displaced fracture of greater trochanter of right femur, initial encounter for closed fracture: Secondary | ICD-10-CM | POA: Diagnosis not present

## 2021-03-19 DIAGNOSIS — Z7982 Long term (current) use of aspirin: Secondary | ICD-10-CM

## 2021-03-19 DIAGNOSIS — W1830XA Fall on same level, unspecified, initial encounter: Secondary | ICD-10-CM | POA: Diagnosis present

## 2021-03-19 DIAGNOSIS — I639 Cerebral infarction, unspecified: Secondary | ICD-10-CM | POA: Diagnosis not present

## 2021-03-19 DIAGNOSIS — M2550 Pain in unspecified joint: Secondary | ICD-10-CM | POA: Diagnosis not present

## 2021-03-19 DIAGNOSIS — S299XXA Unspecified injury of thorax, initial encounter: Secondary | ICD-10-CM | POA: Diagnosis not present

## 2021-03-19 DIAGNOSIS — I160 Hypertensive urgency: Secondary | ICD-10-CM | POA: Diagnosis present

## 2021-03-19 DIAGNOSIS — S72115D Nondisplaced fracture of greater trochanter of left femur, subsequent encounter for closed fracture with routine healing: Secondary | ICD-10-CM | POA: Diagnosis not present

## 2021-03-19 DIAGNOSIS — R262 Difficulty in walking, not elsewhere classified: Secondary | ICD-10-CM | POA: Diagnosis not present

## 2021-03-19 DIAGNOSIS — E559 Vitamin D deficiency, unspecified: Secondary | ICD-10-CM | POA: Diagnosis present

## 2021-03-19 LAB — COMPREHENSIVE METABOLIC PANEL
ALT: 20 U/L (ref 0–44)
AST: 23 U/L (ref 15–41)
Albumin: 3.8 g/dL (ref 3.5–5.0)
Alkaline Phosphatase: 59 U/L (ref 38–126)
Anion gap: 11 (ref 5–15)
BUN: 14 mg/dL (ref 8–23)
CO2: 23 mmol/L (ref 22–32)
Calcium: 8.2 mg/dL — ABNORMAL LOW (ref 8.9–10.3)
Chloride: 100 mmol/L (ref 98–111)
Creatinine, Ser: 0.54 mg/dL (ref 0.44–1.00)
GFR, Estimated: >60 mL/min (ref >60)
Glucose, Bld: 97 mg/dL (ref 70–99)
Potassium: 2.9 mmol/L — ABNORMAL LOW (ref 3.5–5.1)
Sodium: 134 mmol/L — ABNORMAL LOW (ref 135–145)
Total Bilirubin: 0.2 mg/dL — ABNORMAL LOW (ref 0.3–1.2)
Total Protein: 6.6 g/dL (ref 6.5–8.1)

## 2021-03-19 LAB — RESP PANEL BY RT-PCR (FLU A&B, COVID) ARPGX2
Influenza A by PCR: NEGATIVE
Influenza B by PCR: NEGATIVE
SARS Coronavirus 2 by RT PCR: NEGATIVE

## 2021-03-19 LAB — CBC WITH DIFFERENTIAL/PLATELET
Abs Immature Granulocytes: 0.08 10*3/uL — ABNORMAL HIGH (ref 0.00–0.07)
Basophils Absolute: 0.1 10*3/uL (ref 0.0–0.1)
Basophils Relative: 1 %
Eosinophils Absolute: 0.2 10*3/uL (ref 0.0–0.5)
Eosinophils Relative: 2 %
HCT: 31.5 % — ABNORMAL LOW (ref 36.0–46.0)
Hemoglobin: 10.2 g/dL — ABNORMAL LOW (ref 12.0–15.0)
Immature Granulocytes: 1 %
Lymphocytes Relative: 11 %
Lymphs Abs: 1.2 10*3/uL (ref 0.7–4.0)
MCH: 27.1 pg (ref 26.0–34.0)
MCHC: 32.4 g/dL (ref 30.0–36.0)
MCV: 83.6 fL (ref 80.0–100.0)
Monocytes Absolute: 0.8 10*3/uL (ref 0.1–1.0)
Monocytes Relative: 7 %
Neutro Abs: 9.2 10*3/uL — ABNORMAL HIGH (ref 1.7–7.7)
Neutrophils Relative %: 78 %
Platelets: 408 10*3/uL — ABNORMAL HIGH (ref 150–400)
RBC: 3.77 MIL/uL — ABNORMAL LOW (ref 3.87–5.11)
RDW: 13.4 % (ref 11.5–15.5)
WBC: 11.5 10*3/uL — ABNORMAL HIGH (ref 4.0–10.5)
nRBC: 0 % (ref 0.0–0.2)

## 2021-03-19 LAB — ETHANOL: Alcohol, Ethyl (B): 111 mg/dL — ABNORMAL HIGH (ref <10)

## 2021-03-19 MED ORDER — FENTANYL CITRATE PF 50 MCG/ML IJ SOSY
50.0000 ug | PREFILLED_SYRINGE | Freq: Once | INTRAMUSCULAR | Status: AC
  Administered 2021-03-19: 50 ug via INTRAVENOUS
  Filled 2021-03-19: qty 1

## 2021-03-19 NOTE — H&P (Signed)
Stacie [name] KGU:[id number] DOB: 05/09/1955 DOA: 03/19/2021     PCP: [name], NP   Outpatient Specialists:   NONE    Patient arrived to ER on 03/19/21 at 2129 Referred by Attending Luna Fuse, MD   Patient coming from:    home Lives  With x husband    Chief Complaint:   Chief Complaint  Patient presents with   Fall    HPI: Stacie [name] is a 66 y.o. female with medical history significant of alcohol abuse diastolic CHF HLD HTN depression    Presented with fall Fall this evening while in the bedroom while intoxicated Denies hitting head Not on blood thinners  She fell on hardwood floor onto right hip Unable to ambulate Reports she does not smoke except marijuana   Drinks about 3 beers per day if she does not drink she gets occasional shakes but never had a seizure or severe withdrawals. She cannot recollect how she fell thinks that she may have just lost her balance but does not think she hit her head Prior to the fall reports no history of chest pain or shortness of breath with ambulation able to walk without any assistance She does have bilateral mild weakness she says from prior stroke   Initial COVID TEST  NEGATIVE   Lab Results  Component Value Date   [place] 03/19/2021   [place] NEGATIVE 05/04/2019     Regarding pertinent Chronic problems:    Hyperlipidemia - on statins Zocor Lipid Panel     Component Value Date/Time   CHOL 173 03/13/2020 1217   TRIG 96.0 03/13/2020 1217   HDL 68.00 03/13/2020 1217   CHOLHDL 3 03/13/2020 1217   VLDL 19.2 03/13/2020 1217   LDLCALC 86 03/13/2020 1217   LDLCALC 164 (H) 06/04/2017 1434     HTN on Norvasc   chronic CHF diastolic/systolic/ combined - last echo  April 6283 grade 1 diastolic dysfunction preserved EF    PAD  - On Aspirin, statin,                 Hx of CVA -  on Aspirin 81 mg, and statin      Chronic anemia - baseline hg Hemoglobin & Hematocrit   Recent Labs    03/19/21 2241  HGB 10.2*     While in ER:   Noted to have bilateral hip fractures orthopedics has been consulted will see in the morning    Ordered  CT HEAD   NON acute  CXR -  NON acute   Right intertrochanteric hip fracture, as above.   Mildly displaced left greater trochanter fracture.   Following Medications were ordered in ER: Medications  fentaNYL (SUBLIMAZE) injection 50 mcg (50 mcg Intravenous Given 03/19/21 2326)    _______________________________________________________ ER Provider Called:     Dr. Mardelle Matte They Recommend admit to medicine   Will see in AM      ED Triage Vitals  Enc Vitals Group     BP 03/19/21 2145 137/63     Pulse Rate 03/19/21 2145 77     Resp 03/19/21 2314 17     Temp 03/19/21 2145 97.9 F (36.6 C)     Temp Source 03/19/21 2145 Oral     SpO2 03/19/21 2145 93 %     Weight --      Height --      Head Circumference --      Peak Flow --  Pain Score --      Pain Loc --      Pain Edu? --      Excl. in [place]? --   TMAX(24)@     _________________________________________ Significant initial  Findings: Abnormal Labs Reviewed  CBC WITH DIFFERENTIAL/PLATELET - Abnormal; Notable for the following components:      Result Value   WBC 11.5 (*)    RBC 3.77 (*)    Hemoglobin 10.2 (*)    HCT 31.5 (*)    Platelets 408 (*)    Neutro Abs 9.2 (*)    Abs Immature Granulocytes 0.08 (*)    All other components within normal limits  COMPREHENSIVE METABOLIC PANEL - Abnormal; Notable for the following components:   Sodium 134 (*)    Potassium 2.9 (*)    Calcium 8.2 (*)    Total Bilirubin 0.2 (*)    All other components within normal limits  ETHANOL - Abnormal; Notable for the following components:   Alcohol, Ethyl (B) 111 (*)    All other components within normal limits       ECG: Ordered Personally reviewed by me showing: HR : 81 Rhythm:  NSR,    nonspecific changes,  flat t waves QTC 419    The recent clinical data  is shown below. Vitals:   03/19/21 2145 03/19/21 2314  BP: 137/63 136/62  Pulse: 77 83  Resp:  17  Temp: 97.9 F (36.6 C) 98.1 F (36.7 C)  TempSrc: Oral Oral  SpO2: 93% 97%    WBC     Component Value Date/Time   WBC 11.5 (H) 03/19/2021 2241   LYMPHSABS 1.2 03/19/2021 2241   MONOABS 0.8 03/19/2021 2241   EOSABS 0.2 03/19/2021 2241   BASOSABS 0.1 03/19/2021 2241        Results for orders placed or performed during the hospital encounter of 03/19/21  Resp Panel by RT-PCR (Flu A&B, Covid) Nasopharyngeal Swab     Status: None   Collection Time: 03/19/21 10:41 PM   Specimen: Nasopharyngeal Swab; Nasopharyngeal(NP) swabs in vial transport medium  Result Value Ref Range Status   SARS Coronavirus 2 by RT PCR NEGATIVE NEGATIVE Final         Influenza A by PCR NEGATIVE NEGATIVE Final   Influenza B by PCR NEGATIVE NEGATIVE Final           _______________________________________________ Hospitalist was called for admission for bilateral hip fractures  The following Work up has been ordered so far:  Orders Placed This Encounter  Procedures   Resp Panel by RT-PCR (Flu A&B, Covid) Nasopharyngeal Swab   DG Hip Unilat W or Wo Pelvis 2-3 Views Right   DG Femur Min 2 Views Right   DG Chest 1 View   CBC with Differential   Comprehensive metabolic panel   Ethanol   Consult to orthopedic surgery   Consult to hospitalist   EKG 12-Lead   Type and screen     OTHER Significant initial  Findings:  labs showing:    Recent Labs  Lab 03/19/21 2241  NA 134*  K 2.9*  CO2 23  GLUCOSE 97  BUN 14  CREATININE 0.54  CALCIUM 8.2*    Cr    stable,    Lab Results  Component Value Date   CREATININE 0.54 03/19/2021   CREATININE 0.82 03/13/2020   CREATININE 0.83 06/20/2019    Recent Labs  Lab 03/19/21 2241  AST 23  ALT 20  ALKPHOS 59  BILITOT 0.2*  PROT 6.6  ALBUMIN 3.8   Lab Results  Component Value Date   CALCIUM 8.2 (L) 03/19/2021   PHOS 4.3 05/12/2019           Plt: Lab Results  Component Value Date   PLT 408 (H) 03/19/2021      COVID-19 Labs  No results for input(s): DDIMER, FERRITIN, LDH, CRP in the last 72 hours.  Lab Results  Component Value Date   SARSCOV2NAA NEGATIVE 03/19/2021   [place] NEGATIVE 05/04/2019         Recent Labs  Lab 03/19/21 2241  WBC 11.5*  NEUTROABS 9.2*  HGB 10.2*  HCT 31.5*  MCV 83.6  PLT 408*    HG/HCT   stable,     Component Value Date/Time   HGB 10.2 (L) 03/19/2021 2241   HCT 31.5 (L) 03/19/2021 2241   MCV 83.6 03/19/2021 2241          Cultures:    Component Value Date/Time   SDES URINE, RANDOM 05/04/2019 1722   SPECREQUEST  05/04/2019 1722    NONE Performed at [facility], [place] [address] [place],  [address]    CULT (A) 05/04/2019 1722    70,000 COLONIES/mL ESCHERICHIA COLI Confirmed Extended Spectrum Beta-Lactamase Producer (ESBL).  In bloodstream infections from ESBL organisms, carbapenems are preferred over piperacillin/tazobactam. They are shown to have a lower risk of mortality.    REPTSTATUS 05/06/2019 FINAL 05/04/2019 1722     Radiological Exams on Admission: DG Chest 1 View  Result Date: 03/19/2021 CLINICAL DATA:  Fall and right femur fracture. EXAM: CHEST  1 VIEW COMPARISON:  Chest radiograph dated 09/17/2015. FINDINGS: No focal consolidation, pleural effusion, pneumothorax. The cardiac silhouette is within normal limits. No acute osseous pathology. IMPRESSION: No active disease. Electronically Signed   By: Anner Crete M.D.   On: 03/19/2021 22:45   DG Hip Unilat W or Wo Pelvis 2-3 Views Right  Result Date: 03/19/2021 CLINICAL DATA:  Fall, right thigh pain EXAM: DG HIP (WITH OR WITHOUT PELVIS) 2-3V RIGHT COMPARISON:  None. FINDINGS: Right intertrochanteric hip fracture with foreshortening and varus angulation. Mildly displaced left greater trochanter fracture. Bilateral hip joint spaces are preserved. Visualized bony pelvis appears intact. IMPRESSION:  Right intertrochanteric hip fracture, as above. Mildly displaced left greater trochanter fracture. Electronically Signed   By: Julian Hy M.D.   On: 03/19/2021 22:45   DG Femur Min 2 Views Right  Result Date: 03/19/2021 CLINICAL DATA:  Fall, right thigh pain EXAM: RIGHT FEMUR 2 VIEWS COMPARISON:  None. FINDINGS: Intertrochanteric right hip fracture with foreshortening and varus angulation. No fracture or dislocation at the knee. IMPRESSION: Negative. Electronically Signed   By: Julian Hy M.D.   On: 03/19/2021 22:45   _______________________________________________________________________________________________________ Latest  Blood pressure 136/62, pulse 83, temperature 98.1 F (36.7 C), temperature source Oral, resp. rate 17, SpO2 97 %.   Vitals  labs and radiology finding personally reviewed  Review of Systems:    Pertinent positives include: fall  Constitutional:  No weight loss, night sweats, Fevers, chills, fatigue, weight loss  HEENT:  No headaches, Difficulty swallowing,Tooth/dental problems,Sore throat,  No sneezing, itching, ear ache, nasal congestion, post nasal drip,  Cardio-vascular:  No chest pain, Orthopnea, PND, anasarca, dizziness, palpitations.no Bilateral lower extremity swelling  GI:  No heartburn, indigestion, abdominal pain, nausea, vomiting, diarrhea, change in bowel habits, loss of appetite, melena, blood in stool, hematemesis Resp:  no shortness of breath at rest. No dyspnea on exertion, No excess mucus, no  productive cough, No non-productive cough, No coughing up of blood.No change in color of mucus.No wheezing. Skin:  no rash or lesions. No jaundice GU:  no dysuria, change in color of urine, no urgency or frequency. No straining to urinate.  No flank pain.  Musculoskeletal:  No joint pain or no joint swelling. No decreased range of motion. No back pain.  Psych:  No change in mood or affect. No depression or anxiety. No memory loss.  Neuro:  no localizing neurological complaints, no tingling, no weakness, no double vision, no gait abnormality, no slurred speech, no confusion  All systems reviewed and apart from [place] all are negative _______________________________________________________________________________________________ Past Medical History:   Past Medical History:  Diagnosis Date   Colitis    Hyperlipidemia    Hypertension    Stroke ([place]) 04/2012   unknown type   Thyroid disease       History reviewed. No pertinent surgical history.  Social History:  Ambulatory   independently       reports that she quit smoking about 22 months ago. Her smoking use included cigarettes. She has a 2.50 pack-year smoking history. She has never used smokeless tobacco. She reports current alcohol use of about 14.0 standard drinks per week. She reports current drug use. Frequency: 7.00 times per week. Drug: Marijuana.     Family History:   Family History  Problem Relation Age of Onset   Stroke Mother 11       embolic   Heart disease Father 76       CAd with CABG, no MI   Heart disease Sister 75       CAD   Heart disease Paternal Uncle    Heart disease Maternal Grandmother    Heart disease Paternal Grandfather    Colon cancer Neg Hx    Esophageal cancer Neg Hx    Stomach cancer Neg Hx    ______________________________________________________________________________________________ Allergies: Allergies  Allergen Reactions   Trazodone And Nefazodone Nausea And Vomiting   Wellbutrin [Bupropion] Nausea And Vomiting     Prior to Admission medications   Medication Sig Start Date End Date Taking? Authorizing Provider  acetaminophen (TYLENOL) 325 MG tablet Take 2 tablets (650 mg total) by mouth every 4 (four) hours as needed for mild pain (or temp > 37.5 C (99.5 F)). 05/18/19   Angiulli, Lavon Paganini, PA-C  amLODipine (NORVASC) 10 MG tablet Take 1 tablet (10 mg total) by mouth daily. Needs office visit for additional refills  02/25/21   Nche, Charlene Brooke, NP  aspirin EC 81 MG EC tablet Take 1 tablet (81 mg total) by mouth daily. 05/10/19   Amin, Ankit Chirag, MD  budesonide (ENTOCORT EC) 3 MG 24 hr capsule Take 3 capsules (9 mg total) by mouth daily. Patient not taking: Reported on 12/19/2020 11/04/20   Doran Stabler, MD  diphenhydrAMINE (BENADRYL) 25 MG tablet Take 25 mg by mouth at bedtime as needed.    [provider]  escitalopram (LEXAPRO) 20 MG tablet TAKE 1 TABLET(20 MG) BY MOUTH DAILY. No additional refill without office visit 03/10/21   Nche, Charlene Brooke, NP  hydrALAZINE (APRESOLINE) 100 MG tablet Take 1 tablet (100 mg total) by mouth every 8 (eight) hours. Patient taking differently: Take 100 mg by mouth 2 (two) times daily. 07/09/20   Nche, Charlene Brooke, NP  hydrOXYzine (VISTARIL) 25 MG capsule Take 1 capsule (25 mg total) by mouth every 12 (twelve) hours as needed. Patient not taking: Reported on 12/19/2020 03/13/20  Nche, Charlene Brooke, NP  isosorbide mononitrate (IMDUR) 30 MG 24 hr tablet TAKE 1 TABLET(30 MG) BY MOUTH DAILY 03/13/20   Nche, Charlene Brooke, NP  Multiple Vitamin (MULTIVITAMIN WITH MINERALS) TABS tablet Take 1 tablet by mouth daily. 05/10/19   Amin, Jeanella Flattery, MD  simvastatin (ZOCOR) 20 MG tablet Take 1 tablet (20 mg total) by mouth daily at 6 PM. Needs office visit for additional refills 02/25/21   Nche, Charlene Brooke, NP    ___________________________________________________________________________________________________ Physical Exam: Vitals with BMI 03/19/2021 03/19/2021 11/04/2020  Height - - 5' 4.25"  Weight - - 90 lbs 2 oz  BMI - - 46.65  Systolic 993 570 177  Diastolic 62 63 70  Pulse 83 77 76     1. General:  in No  Acute distress    Chronically ill   -appearing 2. Psychological: Alert and   Oriented 3. Head/ENT:    Dry Mucous Membranes                          Head Non traumatic, neck supple                        Poor Dentition 4. SKIN: n decreased Skin turgor,   Skin clean Dry and intact no rash 5. Heart: Regular rate and rhythm no  Murmur, no Rub or gallop 6. Lungs:   no wheezes or crackles   7. Abdomen: Soft,  non-tender, Non distended  bowel sounds present 8. Lower extremities: no clubbing, cyanosis, no  edema 9. Neurologically Grossly intact, moving all 4 extremities equally   10. MSK: Normal range of motion limited in hips due to pain no tremor    Chart has been reviewed  ______________________________________________________________________________________________  Assessment/Plan  66 y.o. 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   Keep nothing by mouth post midnight. Patient  not on anticoagulation  Hold aspirin Ordered type and screen,   order a vitamin D level  Patient at baseline  able to walk a flight of stairs or 100 feet      Patient denies any chest pain or shortness of breath currently and/or with exertion,  ECG showing no evidence of acute ischemia  Given pt does not know how she fell And was intoxicated get one trop   Given advanced age patient is at least moderate  risk * which  has been discussed with family but at this point no furthther cardiac workup is indicated.       Hypokalemia Replace and recheck check magnesium and phosphate level  Protein-calorie malnutrition, severe Check prealbumin nutritional consult ordered    Other plan as per orders.  DVT prophylaxis:  SCD  Code Status: DNR/DNI  as per patient   I had personally discussed CODE STATUS with patient     Family Communication:   Family not at  Bedside    Disposition Plan:     likely will need placement for rehabilitation   Following barriers for discharge:                                                       Pain controlled with PO medications                                                         Will need consultants to evaluate patient prior to discharge                       Would benefit from PT/OT eval prior to DC                                         Transition of care consulted                  Consults called: Orthopedics  Admission status:  ED Disposition     ED Disposition  [place]: [facility] [[id number]]  Level of Care: Telemetry [5]  Admit to tele based on following criteria: Other see comments  Comments: alcohol withdrawal  May admit patient to [facility] or [facility] if equivalent level of care is available:: No  Covid Evaluation: Confirmed COVID Negative  Diagnosis: Hip fracture [facility]) [[id number]]  Admitting Physician: Toy Baker [[id number]]  Attending Physician: Toy Baker [[id number]]  Estimated length of stay: past midnight tomorrow  Certification:: I certify this patient will need inpatient services for at least 2 midnights            inpatient     I Expect 2 midnight stay secondary to severity of patient's current illness need for inpatient interventions justified by the following:     Severe lab/radiological/exam abnormalities including:   Bilateral hip fracture and  extensive comorbidities including:  substance abuse    CHF  history of stroke with residual deficits    That are currently affecting medical management.   I expect  patient to be hospitalized for 2 midnights requiring inpatient medical care.  Patient is at high risk for adverse outcome (such as loss of life or disability) if not treated.  Indication for inpatient stay as follows:    Need for operative/procedural  intervention     Need for , IV fluids,  IV pain medications,     Level of care     tele  For   24H    Lab Results  Component Value Date   [place] 03/19/2021     Precautions: admitted as   Covid Negative   Kellina Dreese 03/20/2021, 12:36 AM    [facility]     after 2 AM please page floor coverage PA If 7AM-7PM, please contact the day team taking care  of the patient using [email or web address]   Patient was evaluated in the context of the global COVID-19 pandemic, which necessitated consideration that the patient might be at risk for infection with the SARS-CoV-2 virus that causes COVID-19. Institutional protocols and algorithms that pertain to the evaluation of patients at risk for COVID-19 are in a state of rapid change based on information released by regulatory bodies including the CDC and federal and state organizations. These policies and algorithms were followed during the patient's care.

## 2021-03-19 NOTE — Subjective & Objective (Signed)
Fall this evening while in the bedroom while intoxicated Denies hitting head Not on blood thinners  She fell on hardwood floor onto right hip Unable to ambulate

## 2021-03-19 NOTE — Assessment & Plan Note (Signed)
restart norvasc when able

## 2021-03-19 NOTE — Assessment & Plan Note (Addendum)
Restart Zocor when able to tolerate 

## 2021-03-19 NOTE — Assessment & Plan Note (Signed)
Appears well compensated continue to monitor

## 2021-03-19 NOTE — Assessment & Plan Note (Signed)
Order CIWA protocol monitor for any sign of withdrawal

## 2021-03-19 NOTE — ED Provider Notes (Signed)
[facility] DEPT Provider Note   CSN: [id number] Arrival date & time: 03/19/21  2129     History  Chief Complaint  Patient presents with   Stacie [name] is a 66 y.o. female.  The history is provided by the patient and medical records.  Fall  66 y.o. F with hx of HTN, CHF, depression, alcohol dependence, PAD, presenting to the ED following a fall.  States she fell in her bedroom, think she just lost her footing while trying to get to the bathroom.  She fell on hardwood floor onto right hip.  Denies head injury or LOC.  She is not on anticoagulation.  She reports right hip and thigh pain.  Does have EtOH on board tonight.  No meds given en route PTA.  Home Medications Prior to Admission medications   Medication Sig Start Date End Date Taking? Authorizing Provider  acetaminophen (TYLENOL) 325 MG tablet Take 2 tablets (650 mg total) by mouth every 4 (four) hours as needed for mild pain (or temp > 37.5 C (99.5 F)). 05/18/19   Angiulli, Lavon Paganini, PA-C  amLODipine (NORVASC) 10 MG tablet Take 1 tablet (10 mg total) by mouth daily. Needs office visit for additional refills 02/25/21   Nche, Charlene Brooke, NP  aspirin EC 81 MG EC tablet Take 1 tablet (81 mg total) by mouth daily. 05/10/19   Amin, Ankit Chirag, MD  budesonide (ENTOCORT EC) 3 MG 24 hr capsule Take 3 capsules (9 mg total) by mouth daily. Patient not taking: Reported on 12/19/2020 11/04/20   Doran Stabler, MD  diphenhydrAMINE (BENADRYL) 25 MG tablet Take 25 mg by mouth at bedtime as needed.    [provider]  escitalopram (LEXAPRO) 20 MG tablet TAKE 1 TABLET(20 MG) BY MOUTH DAILY. No additional refill without office visit 03/10/21   Nche, Charlene Brooke, NP  hydrALAZINE (APRESOLINE) 100 MG tablet Take 1 tablet (100 mg total) by mouth every 8 (eight) hours. Patient taking differently: Take 100 mg by mouth 2 (two) times daily. 07/09/20   Nche, Charlene Brooke, NP  hydrOXYzine  (VISTARIL) 25 MG capsule Take 1 capsule (25 mg total) by mouth every 12 (twelve) hours as needed. Patient not taking: Reported on 12/19/2020 03/13/20   [name], NP  isosorbide mononitrate (IMDUR) 30 MG 24 hr tablet TAKE 1 TABLET(30 MG) BY MOUTH DAILY 03/13/20   Nche, Charlene Brooke, NP  Multiple Vitamin (MULTIVITAMIN WITH MINERALS) TABS tablet Take 1 tablet by mouth daily. 05/10/19   Amin, Jeanella Flattery, MD  simvastatin (ZOCOR) 20 MG tablet Take 1 tablet (20 mg total) by mouth daily at 6 PM. Needs office visit for additional refills 02/25/21   Nche, Charlene Brooke, NP      Allergies    Trazodone and nefazodone and Wellbutrin [bupropion]    Review of Systems   Review of Systems  Musculoskeletal:  Positive for arthralgias.  All other systems reviewed and are negative.  Physical Exam Updated Vital Signs BP 136/62 (BP Location: Left Arm)    Pulse 83    Temp 98.1 F (36.7 C) (Oral)    Resp 17    SpO2 97%   Physical Exam Vitals and nursing note reviewed.  Constitutional:      Appearance: She is well-developed.  HENT:     Head: Normocephalic and atraumatic.     Comments: No visible head trauma Eyes:     Conjunctiva/sclera: Conjunctivae normal.     Pupils: Pupils  are equal, round, and reactive to light.  Cardiovascular:     Rate and Rhythm: Normal rate and regular rhythm.     Heart sounds: Normal heart sounds.  Pulmonary:     Effort: Pulmonary effort is normal.     Breath sounds: Normal breath sounds.  Abdominal:     General: Bowel sounds are normal.     Palpations: Abdomen is soft.  Musculoskeletal:        General: Normal range of motion.     Cervical back: Normal range of motion.     Comments: Right leg held in flexed position at the hip, unwilling to move leg at all during exam, endorses pain from right hip to mid femur; DP pulse intact, able to move foot and wiggle toes  Skin:    General: Skin is warm and dry.  Neurological:     Mental Status: She is alert and oriented to  person, place, and time.    ED Results / Procedures / Treatments   Labs (all labs ordered are listed, but only abnormal results are displayed) Labs Reviewed  CBC WITH DIFFERENTIAL/PLATELET - Abnormal; Notable for the following components:      Result Value   WBC 11.5 (*)    RBC 3.77 (*)    Hemoglobin 10.2 (*)    HCT 31.5 (*)    Platelets 408 (*)    Neutro Abs 9.2 (*)    Abs Immature Granulocytes 0.08 (*)    All other components within normal limits  COMPREHENSIVE METABOLIC PANEL - Abnormal; Notable for the following components:   Sodium 134 (*)    Potassium 2.9 (*)    Calcium 8.2 (*)    Total Bilirubin 0.2 (*)    All other components within normal limits  ETHANOL - Abnormal; Notable for the following components:   Alcohol, Ethyl (B) 111 (*)    All other components within normal limits  RESP PANEL BY RT-PCR (FLU A&B, COVID) [id number]  TYPE AND SCREEN    EKG None  Radiology DG Chest 1 View  Result Date: 03/19/2021 CLINICAL DATA:  Fall and right femur fracture. EXAM: CHEST  1 VIEW COMPARISON:  Chest radiograph dated 09/17/2015. FINDINGS: No focal consolidation, pleural effusion, pneumothorax. The cardiac silhouette is within normal limits. No acute osseous pathology. IMPRESSION: No active disease. Electronically Signed   By: Anner Crete M.D.   On: 03/19/2021 22:45   DG Hip Unilat W or Wo Pelvis 2-3 Views Right  Result Date: 03/19/2021 CLINICAL DATA:  Fall, right thigh pain EXAM: DG HIP (WITH OR WITHOUT PELVIS) 2-3V RIGHT COMPARISON:  None. FINDINGS: Right intertrochanteric hip fracture with foreshortening and varus angulation. Mildly displaced left greater trochanter fracture. Bilateral hip joint spaces are preserved. Visualized bony pelvis appears intact. IMPRESSION: Right intertrochanteric hip fracture, as above. Mildly displaced left greater trochanter fracture. Electronically Signed   By: Julian Hy M.D.   On: 03/19/2021 22:45   DG Femur Min 2 Views Right  Result  Date: 03/19/2021 CLINICAL DATA:  Fall, right thigh pain EXAM: RIGHT FEMUR 2 VIEWS COMPARISON:  None. FINDINGS: Intertrochanteric right hip fracture with foreshortening and varus angulation. No fracture or dislocation at the knee. IMPRESSION: Negative. Electronically Signed   By: Julian Hy M.D.   On: 03/19/2021 22:45    Procedures Procedures    Medications Ordered in ED Medications  fentaNYL (SUBLIMAZE) injection 50 mcg (50 mcg Intravenous Given 03/19/21 2326)    ED Course/ Medical Decision Making/ A&P  Medical Decision Making Amount and/or Complexity of Data Reviewed Independent Historian: EMS External Data Reviewed: labs and notes. Labs: ordered. Radiology: ordered and independent interpretation performed. ECG/medicine tests: ordered and independent interpretation performed.  Risk Prescription drug management. Decision regarding hospitalization.   66 y.o. F here after a fall that occurred in her bedroom tonight while trying to get to the bathroom.  Does have EtOH on board.  Mostly complaining of right hip pain.  She is awake, alert, does appear somewhat intoxicated.  She does not have any acute signs of head trauma.  She is answering questions and following commands.  She is unwilling to move right leg whatsoever and prefers to keep it held in flexed position but both legs are neurovascularly intact.  X-ray reviewed, right intertrochanteric fracture as well as fracture of left greater trochanter.  Patient was made aware of these findings.  She will require admission.  We will keep n.p.o.  Discussed with orthopedics, Dr. Mardelle Matte-- will see in AM.  Admit to medicine.  Labs as above, ethanol 111.  No significant electrolyte imbalance.   Spoke with Dr. Roel Cluck who will admit-- requested head CT given her intoxication, this has been ordered and is negative.  Aware that patient is a chronic alcohol user and likely will need to be placed on CIWA  protocol.  Final Clinical Impression(s) / ED Diagnoses Final diagnoses:  Closed right hip fracture, initial encounter ([place])  Closed displaced fracture of greater trochanter of left femur, initial encounter [facility])    Rx / [place] Orders ED Discharge Orders     None         Larene Pickett, PA-C 03/20/21 0024    Luna Fuse, MD 03/30/21 1253

## 2021-03-19 NOTE — Assessment & Plan Note (Signed)
Monitor for any sign of sundowning hold aspirin

## 2021-03-19 NOTE — ED Triage Notes (Signed)
Pt c/o fall in her bedroom this evening, pt c/o right inner thigh pain. Pt denies hitting her head, pt denies LOC. Pt denies taking blood thinners. Pt states pain is worse with movement. EMS reports to obvious deformities. EMS reports ETOH on board.

## 2021-03-20 ENCOUNTER — Inpatient Hospital Stay (HOSPITAL_COMMUNITY): Payer: Medicare Other

## 2021-03-20 ENCOUNTER — Encounter (HOSPITAL_COMMUNITY): Payer: Self-pay | Admitting: Internal Medicine

## 2021-03-20 ENCOUNTER — Inpatient Hospital Stay (HOSPITAL_COMMUNITY): Payer: Medicare Other | Admitting: Certified Registered Nurse Anesthetist

## 2021-03-20 ENCOUNTER — Other Ambulatory Visit: Payer: Self-pay

## 2021-03-20 ENCOUNTER — Encounter (HOSPITAL_COMMUNITY)
Admission: EM | Disposition: A | Discharge status: Skilled Nursing Facility | Payer: Self-pay | Source: Home / Self Care | Attending: Internal Medicine

## 2021-03-20 DIAGNOSIS — S72001A Fracture of unspecified part of neck of right femur, initial encounter for closed fracture: Secondary | ICD-10-CM | POA: Diagnosis present

## 2021-03-20 DIAGNOSIS — I509 Heart failure, unspecified: Secondary | ICD-10-CM

## 2021-03-20 DIAGNOSIS — I739 Peripheral vascular disease, unspecified: Secondary | ICD-10-CM

## 2021-03-20 DIAGNOSIS — I110 Hypertensive heart disease with heart failure: Secondary | ICD-10-CM

## 2021-03-20 DIAGNOSIS — E876 Hypokalemia: Secondary | ICD-10-CM | POA: Diagnosis present

## 2021-03-20 DIAGNOSIS — I6990 Unspecified sequelae of unspecified cerebrovascular disease: Secondary | ICD-10-CM | POA: Diagnosis not present

## 2021-03-20 DIAGNOSIS — S72002A Fracture of unspecified part of neck of left femur, initial encounter for closed fracture: Secondary | ICD-10-CM | POA: Diagnosis present

## 2021-03-20 HISTORY — PX: FEMUR IM NAIL: SHX1597

## 2021-03-20 LAB — ABO/RH: ABO/RH(D): A POS

## 2021-03-20 LAB — PROTIME-INR
INR: 0.9 (ref 0.8–1.2)
Prothrombin Time: 12.2 seconds (ref 11.4–15.2)

## 2021-03-20 LAB — CBC
HCT: 32.2 % — ABNORMAL LOW (ref 36.0–46.0)
Hemoglobin: 10.3 g/dL — ABNORMAL LOW (ref 12.0–15.0)
MCH: 26.7 pg (ref 26.0–34.0)
MCHC: 32 g/dL (ref 30.0–36.0)
MCV: 83.4 fL (ref 80.0–100.0)
Platelets: 418 10*3/uL — ABNORMAL HIGH (ref 150–400)
RBC: 3.86 MIL/uL — ABNORMAL LOW (ref 3.87–5.11)
RDW: 13.3 % (ref 11.5–15.5)
WBC: 14.2 10*3/uL — ABNORMAL HIGH (ref 4.0–10.5)
nRBC: 0 % (ref 0.0–0.2)

## 2021-03-20 LAB — TROPONIN I (HIGH SENSITIVITY): Troponin I (High Sensitivity): 3 ng/L (ref <18)

## 2021-03-20 LAB — MAGNESIUM: Magnesium: 1.9 mg/dL (ref 1.7–2.4)

## 2021-03-20 LAB — IRON AND TIBC
Iron: 19 ug/dL — ABNORMAL LOW (ref 28–170)
Saturation Ratios: 5 % — ABNORMAL LOW (ref 10.4–31.8)
TIBC: 405 ug/dL (ref 250–450)
UIBC: 386 ug/dL

## 2021-03-20 LAB — BASIC METABOLIC PANEL
Anion gap: 10 (ref 5–15)
BUN: 13 mg/dL (ref 8–23)
CO2: 23 mmol/L (ref 22–32)
Calcium: 8.3 mg/dL — ABNORMAL LOW (ref 8.9–10.3)
Chloride: 100 mmol/L (ref 98–111)
Creatinine, Ser: 0.39 mg/dL — ABNORMAL LOW (ref 0.44–1.00)
GFR, Estimated: >60 mL/min (ref >60)
Glucose, Bld: 131 mg/dL — ABNORMAL HIGH (ref 70–99)
Potassium: 2.8 mmol/L — ABNORMAL LOW (ref 3.5–5.1)
Sodium: 133 mmol/L — ABNORMAL LOW (ref 135–145)

## 2021-03-20 LAB — RETICULOCYTES
Immature Retic Fract: 10.1 % (ref 2.3–15.9)
RBC.: 3.77 MIL/uL — ABNORMAL LOW (ref 3.87–5.11)
Retic Count, Absolute: 49.8 10*3/uL (ref 19.0–186.0)
Retic Ct Pct: 1.3 % (ref 0.4–3.1)

## 2021-03-20 LAB — TYPE AND SCREEN
ABO/RH(D): A POS
Antibody Screen: NEGATIVE

## 2021-03-20 LAB — VITAMIN B12: Vitamin B-12: 61 pg/mL — ABNORMAL LOW (ref 180–914)

## 2021-03-20 LAB — FERRITIN: Ferritin: 10 ng/mL — ABNORMAL LOW (ref 11–307)

## 2021-03-20 LAB — PHOSPHORUS: Phosphorus: 3 mg/dL (ref 2.5–4.6)

## 2021-03-20 LAB — CK: Total CK: 137 U/L (ref 38–234)

## 2021-03-20 LAB — FOLATE: Folate: 11.4 ng/mL (ref >5.9)

## 2021-03-20 LAB — PREALBUMIN: Prealbumin: 22.7 mg/dL (ref 18–38)

## 2021-03-20 LAB — VITAMIN D 25 HYDROXY (VIT D DEFICIENCY, FRACTURES): Vit D, 25-Hydroxy: 17.88 ng/mL — ABNORMAL LOW (ref 30–100)

## 2021-03-20 LAB — AMMONIA: Ammonia: 12 umol/L (ref 9–35)

## 2021-03-20 LAB — TSH: TSH: 7.008 u[IU]/mL — ABNORMAL HIGH (ref 0.350–4.500)

## 2021-03-20 LAB — HIV ANTIBODY (ROUTINE TESTING W REFLEX): HIV Screen 4th Generation wRfx: NONREACTIVE

## 2021-03-20 SURGERY — INSERTION, INTRAMEDULLARY ROD, FEMUR
Anesthesia: General | Laterality: Right

## 2021-03-20 MED ORDER — ACETAMINOPHEN 10 MG/ML IV SOLN
INTRAVENOUS | Status: DC | PRN
  Administered 2021-03-20: 645 mg via INTRAVENOUS

## 2021-03-20 MED ORDER — BISACODYL 10 MG RE SUPP
10.0000 mg | Freq: Every day | RECTAL | Status: DC | PRN
  Administered 2021-03-25: 10 mg via RECTAL
  Filled 2021-03-20: qty 1

## 2021-03-20 MED ORDER — PROPOFOL 10 MG/ML IV BOLUS
INTRAVENOUS | Status: AC
  Filled 2021-03-20: qty 20

## 2021-03-20 MED ORDER — FOLIC ACID 1 MG PO TABS
1.0000 mg | ORAL_TABLET | Freq: Every day | ORAL | Status: DC
  Administered 2021-03-21 – 2021-03-28 (×7): 1 mg via ORAL
  Filled 2021-03-20 (×10): qty 1

## 2021-03-20 MED ORDER — POTASSIUM CHLORIDE 10 MEQ/100ML IV SOLN
10.0000 meq | INTRAVENOUS | Status: DC
  Administered 2021-03-20: 10 meq via INTRAVENOUS
  Filled 2021-03-20: qty 100

## 2021-03-20 MED ORDER — METHOCARBAMOL 1000 MG/10ML IJ SOLN
500.0000 mg | Freq: Four times a day (QID) | INTRAVENOUS | Status: DC | PRN
  Administered 2021-03-20: 500 mg via INTRAVENOUS
  Filled 2021-03-20: qty 500
  Filled 2021-03-20: qty 5

## 2021-03-20 MED ORDER — LACTATED RINGERS IV SOLN: INTRAVENOUS | Status: DC

## 2021-03-20 MED ORDER — METHOCARBAMOL 500 MG PO TABS
500.0000 mg | ORAL_TABLET | Freq: Four times a day (QID) | ORAL | Status: DC | PRN
  Administered 2021-03-22 – 2021-03-24 (×4): 500 mg via ORAL
  Filled 2021-03-20 (×5): qty 1

## 2021-03-20 MED ORDER — METHOCARBAMOL 500 MG PO TABS
500.0000 mg | ORAL_TABLET | Freq: Four times a day (QID) | ORAL | Status: DC | PRN
  Filled 2021-03-20: qty 1

## 2021-03-20 MED ORDER — HYDROCODONE-ACETAMINOPHEN 7.5-325 MG PO TABS: 1.0000 | ORAL_TABLET | ORAL | Status: DC | PRN

## 2021-03-20 MED ORDER — AMLODIPINE BESYLATE 10 MG PO TABS
10.0000 mg | ORAL_TABLET | Freq: Every day | ORAL | Status: DC
  Administered 2021-03-21 – 2021-03-28 (×6): 10 mg via ORAL
  Filled 2021-03-20 (×9): qty 1

## 2021-03-20 MED ORDER — ONDANSETRON HCL 4 MG/2ML IJ SOLN
INTRAMUSCULAR | Status: DC | PRN
  Administered 2021-03-20: 4 mg via INTRAVENOUS

## 2021-03-20 MED ORDER — ACETAMINOPHEN 325 MG PO TABS
325.0000 mg | ORAL_TABLET | Freq: Four times a day (QID) | ORAL | Status: DC | PRN
  Administered 2021-03-27 – 2021-03-28 (×3): 650 mg via ORAL
  Filled 2021-03-20 (×4): qty 2

## 2021-03-20 MED ORDER — CHLORDIAZEPOXIDE HCL 5 MG PO CAPS: 10.0000 mg | ORAL_CAPSULE | Freq: Three times a day (TID) | ORAL | Status: DC

## 2021-03-20 MED ORDER — FENTANYL CITRATE (PF) 250 MCG/5ML IJ SOLN
INTRAMUSCULAR | Status: AC
  Filled 2021-03-20: qty 5

## 2021-03-20 MED ORDER — ROCURONIUM BROMIDE 10 MG/ML (PF) SYRINGE
PREFILLED_SYRINGE | INTRAVENOUS | Status: DC | PRN
  Administered 2021-03-20: 40 mg via INTRAVENOUS

## 2021-03-20 MED ORDER — LIDOCAINE 2% (20 MG/ML) 5 ML SYRINGE
INTRAMUSCULAR | Status: DC | PRN
  Administered 2021-03-20: 40 mg via INTRAVENOUS

## 2021-03-20 MED ORDER — CYANOCOBALAMIN 1000 MCG/ML IJ SOLN: 1000.0000 ug | INTRAMUSCULAR | Status: DC

## 2021-03-20 MED ORDER — CHLORDIAZEPOXIDE HCL 5 MG PO CAPS
10.0000 mg | ORAL_CAPSULE | Freq: Three times a day (TID) | ORAL | Status: AC
  Administered 2021-03-21 – 2021-03-22 (×4): 10 mg via ORAL
  Filled 2021-03-20 (×4): qty 2

## 2021-03-20 MED ORDER — ONDANSETRON HCL 4 MG/2ML IJ SOLN
4.0000 mg | Freq: Four times a day (QID) | INTRAMUSCULAR | Status: DC | PRN
  Administered 2021-03-24: 4 mg via INTRAVENOUS
  Filled 2021-03-20: qty 2

## 2021-03-20 MED ORDER — PHENYLEPHRINE HCL-NACL 20-0.9 MG/250ML-% IV SOLN
INTRAVENOUS | Status: DC | PRN
  Administered 2021-03-20: 30 ug/min via INTRAVENOUS

## 2021-03-20 MED ORDER — CEFAZOLIN SODIUM-DEXTROSE 2-4 GM/100ML-% IV SOLN
2.0000 g | Freq: Four times a day (QID) | INTRAVENOUS | Status: AC
  Administered 2021-03-20 – 2021-03-21 (×2): 2 g via INTRAVENOUS
  Filled 2021-03-20 (×2): qty 100

## 2021-03-20 MED ORDER — CHLORHEXIDINE GLUCONATE 4 % EX LIQD: 60.0000 mL | Freq: Once | CUTANEOUS | Status: DC

## 2021-03-20 MED ORDER — SUGAMMADEX SODIUM 200 MG/2ML IV SOLN
INTRAVENOUS | Status: DC | PRN
  Administered 2021-03-20: 100 mg via INTRAVENOUS

## 2021-03-20 MED ORDER — CHLORHEXIDINE GLUCONATE 0.12 % MT SOLN
15.0000 mL | OROMUCOSAL | Status: AC
  Administered 2021-03-20: 15 mL via OROMUCOSAL

## 2021-03-20 MED ORDER — ACETAMINOPHEN 500 MG PO TABS
500.0000 mg | ORAL_TABLET | Freq: Four times a day (QID) | ORAL | Status: AC
  Administered 2021-03-21 (×3): 500 mg via ORAL
  Filled 2021-03-20 (×3): qty 1

## 2021-03-20 MED ORDER — ROCURONIUM BROMIDE 10 MG/ML (PF) SYRINGE
PREFILLED_SYRINGE | INTRAVENOUS | Status: AC
  Filled 2021-03-20: qty 10

## 2021-03-20 MED ORDER — POVIDONE-IODINE 10 % EX SWAB
2.0000 "application " | Freq: Once | CUTANEOUS | Status: AC
  Administered 2021-03-20: 2 via TOPICAL

## 2021-03-20 MED ORDER — PROPOFOL 10 MG/ML IV BOLUS
INTRAVENOUS | Status: DC | PRN
  Administered 2021-03-20: 80 mg via INTRAVENOUS

## 2021-03-20 MED ORDER — OXYCODONE HCL 5 MG/5ML PO SOLN: 5.0000 mg | Freq: Once | ORAL | Status: DC | PRN

## 2021-03-20 MED ORDER — DOCUSATE SODIUM 100 MG PO CAPS
100.0000 mg | ORAL_CAPSULE | Freq: Two times a day (BID) | ORAL | Status: DC
  Administered 2021-03-21 – 2021-03-27 (×8): 100 mg via ORAL
  Filled 2021-03-20 (×11): qty 1

## 2021-03-20 MED ORDER — VITAMIN B-12 1000 MCG PO TABS
1000.0000 ug | ORAL_TABLET | Freq: Every day | ORAL | Status: DC
  Administered 2021-03-21 – 2021-03-23 (×3): 1000 ug via ORAL
  Filled 2021-03-20 (×4): qty 1

## 2021-03-20 MED ORDER — ONDANSETRON HCL 4 MG PO TABS: 4.0000 mg | ORAL_TABLET | Freq: Four times a day (QID) | ORAL | Status: DC | PRN

## 2021-03-20 MED ORDER — LORAZEPAM 2 MG/ML IJ SOLN
1.0000 mg | INTRAMUSCULAR | Status: AC | PRN
  Administered 2021-03-20: 1 mg via INTRAVENOUS
  Administered 2021-03-20: 2 mg via INTRAVENOUS
  Administered 2021-03-20: 3 mg via INTRAVENOUS
  Administered 2021-03-20: 2 mg via INTRAVENOUS
  Filled 2021-03-20: qty 2
  Filled 2021-03-20 (×3): qty 1

## 2021-03-20 MED ORDER — METHOCARBAMOL 1000 MG/10ML IJ SOLN
500.0000 mg | Freq: Four times a day (QID) | INTRAVENOUS | Status: DC | PRN
  Administered 2021-03-23: 500 mg via INTRAVENOUS
  Filled 2021-03-20 (×2): qty 5

## 2021-03-20 MED ORDER — LORAZEPAM 1 MG PO TABS
1.0000 mg | ORAL_TABLET | ORAL | Status: AC | PRN
  Administered 2021-03-22: 2 mg via ORAL
  Filled 2021-03-20 (×2): qty 2

## 2021-03-20 MED ORDER — PHENOL 1.4 % MT LIQD: 1.0000 | OROMUCOSAL | Status: DC | PRN

## 2021-03-20 MED ORDER — HYDRALAZINE HCL 25 MG PO TABS
50.0000 mg | ORAL_TABLET | Freq: Three times a day (TID) | ORAL | Status: DC
  Administered 2021-03-21 – 2021-03-28 (×18): 50 mg via ORAL
  Filled 2021-03-20 (×19): qty 2

## 2021-03-20 MED ORDER — HYDROCODONE-ACETAMINOPHEN 5-325 MG PO TABS
1.0000 | ORAL_TABLET | Freq: Four times a day (QID) | ORAL | Status: DC | PRN
  Filled 2021-03-20: qty 2

## 2021-03-20 MED ORDER — PROPOFOL 500 MG/50ML IV EMUL
INTRAVENOUS | Status: DC | PRN
  Administered 2021-03-20: 100 ug/kg/min via INTRAVENOUS

## 2021-03-20 MED ORDER — POLYETHYLENE GLYCOL 3350 17 G PO PACK
17.0000 g | PACK | Freq: Every day | ORAL | Status: DC | PRN
  Administered 2021-03-27: 17 g via ORAL

## 2021-03-20 MED ORDER — ISOSORBIDE MONONITRATE ER 30 MG PO TB24
30.0000 mg | ORAL_TABLET | Freq: Every day | ORAL | Status: DC
Start: 2021-03-20 — End: 2021-03-29
  Administered 2021-03-21 – 2021-03-28 (×4): 30 mg via ORAL
  Filled 2021-03-20 (×9): qty 1

## 2021-03-20 MED ORDER — THIAMINE HCL 100 MG PO TABS
100.0000 mg | ORAL_TABLET | Freq: Every day | ORAL | Status: DC
  Administered 2021-03-22 – 2021-03-28 (×5): 100 mg via ORAL
  Filled 2021-03-20 (×6): qty 1

## 2021-03-20 MED ORDER — ACETAMINOPHEN 10 MG/ML IV SOLN
1000.0000 mg | Freq: Once | INTRAVENOUS | Status: DC
Start: 2021-03-20 — End: 2021-03-20
  Filled 2021-03-20: qty 100

## 2021-03-20 MED ORDER — PHENYLEPHRINE HCL (PRESSORS) 10 MG/ML IV SOLN
INTRAVENOUS | Status: AC
Start: 2021-03-20 — End: ?
  Filled 2021-03-20: qty 1

## 2021-03-20 MED ORDER — SIMVASTATIN 20 MG PO TABS
20.0000 mg | ORAL_TABLET | Freq: Every day | ORAL | Status: DC
  Administered 2021-03-21 – 2021-03-28 (×5): 20 mg via ORAL
  Filled 2021-03-20 (×8): qty 1

## 2021-03-20 MED ORDER — TRANEXAMIC ACID-NACL 1000-0.7 MG/100ML-% IV SOLN
1000.0000 mg | Freq: Once | INTRAVENOUS | Status: AC
  Administered 2021-03-20: 1000 mg via INTRAVENOUS
  Filled 2021-03-20: qty 100

## 2021-03-20 MED ORDER — PHENYLEPHRINE HCL (PRESSORS) 10 MG/ML IV SOLN
INTRAVENOUS | Status: AC
  Filled 2021-03-20: qty 1

## 2021-03-20 MED ORDER — SODIUM CHLORIDE 0.9 % IV SOLN
250.0000 mg | Freq: Every day | INTRAVENOUS | Status: AC
  Administered 2021-03-20 – 2021-03-23 (×4): 250 mg via INTRAVENOUS
  Filled 2021-03-20 (×4): qty 20

## 2021-03-20 MED ORDER — ALUM & MAG HYDROXIDE-SIMETH 200-200-20 MG/5ML PO SUSP: 30.0000 mL | ORAL | Status: DC | PRN

## 2021-03-20 MED ORDER — ADULT MULTIVITAMIN W/MINERALS CH
1.0000 | ORAL_TABLET | Freq: Every day | ORAL | Status: DC
  Administered 2021-03-21 – 2021-03-28 (×6): 1 via ORAL
  Filled 2021-03-20 (×10): qty 1

## 2021-03-20 MED ORDER — MORPHINE SULFATE (PF) 2 MG/ML IV SOLN
0.5000 mg | INTRAVENOUS | Status: DC | PRN
  Administered 2021-03-20 (×3): 0.5 mg via INTRAVENOUS
  Filled 2021-03-20 (×3): qty 1

## 2021-03-20 MED ORDER — HALOPERIDOL LACTATE 5 MG/ML IJ SOLN
5.0000 mg | Freq: Four times a day (QID) | INTRAMUSCULAR | Status: DC | PRN
  Administered 2021-03-20 – 2021-03-23 (×3): 5 mg via INTRAVENOUS
  Filled 2021-03-20 (×3): qty 1

## 2021-03-20 MED ORDER — CEFAZOLIN SODIUM-DEXTROSE 2-4 GM/100ML-% IV SOLN
2.0000 g | INTRAVENOUS | Status: AC
  Administered 2021-03-20: 2 g via INTRAVENOUS
  Filled 2021-03-20: qty 100

## 2021-03-20 MED ORDER — ENOXAPARIN SODIUM 40 MG/0.4ML IJ SOSY
40.0000 mg | PREFILLED_SYRINGE | INTRAMUSCULAR | Status: DC
  Administered 2021-03-21 – 2021-03-28 (×7): 40 mg via SUBCUTANEOUS
  Filled 2021-03-20 (×8): qty 0.4

## 2021-03-20 MED ORDER — OXYCODONE HCL 5 MG PO TABS: 5.0000 mg | ORAL_TABLET | Freq: Once | ORAL | Status: DC | PRN

## 2021-03-20 MED ORDER — ONDANSETRON HCL 4 MG/2ML IJ SOLN: 4.0000 mg | Freq: Once | INTRAMUSCULAR | Status: DC | PRN

## 2021-03-20 MED ORDER — SENNA 8.6 MG PO TABS
1.0000 | ORAL_TABLET | Freq: Two times a day (BID) | ORAL | Status: DC
  Administered 2021-03-21 – 2021-03-28 (×11): 8.6 mg via ORAL
  Filled 2021-03-20 (×15): qty 1

## 2021-03-20 MED ORDER — MORPHINE SULFATE (PF) 2 MG/ML IV SOLN
0.5000 mg | INTRAVENOUS | Status: DC | PRN
  Administered 2021-03-22 – 2021-03-23 (×4): 1 mg via INTRAVENOUS
  Filled 2021-03-20 (×4): qty 1

## 2021-03-20 MED ORDER — MENTHOL 3 MG MT LOZG: 1.0000 | LOZENGE | OROMUCOSAL | Status: DC | PRN

## 2021-03-20 MED ORDER — SUCCINYLCHOLINE CHLORIDE 200 MG/10ML IV SOSY
PREFILLED_SYRINGE | INTRAVENOUS | Status: AC
  Filled 2021-03-20: qty 10

## 2021-03-20 MED ORDER — POTASSIUM CHLORIDE IN NACL 20-0.9 MEQ/L-% IV SOLN
INTRAVENOUS | Status: DC
  Filled 2021-03-20 (×3): qty 1000

## 2021-03-20 MED ORDER — DEXAMETHASONE SODIUM PHOSPHATE 10 MG/ML IJ SOLN
INTRAMUSCULAR | Status: AC
  Filled 2021-03-20: qty 1

## 2021-03-20 MED ORDER — HYDROCODONE-ACETAMINOPHEN 5-325 MG PO TABS
1.0000 | ORAL_TABLET | ORAL | Status: DC | PRN
  Administered 2021-03-21: 1 via ORAL
  Administered 2021-03-22 (×2): 2 via ORAL
  Filled 2021-03-20 (×2): qty 2
  Filled 2021-03-20: qty 1
  Filled 2021-03-20: qty 2

## 2021-03-20 MED ORDER — MIDAZOLAM HCL 2 MG/2ML IJ SOLN
INTRAMUSCULAR | Status: AC
  Filled 2021-03-20: qty 2

## 2021-03-20 MED ORDER — POTASSIUM CHLORIDE 10 MEQ/100ML IV SOLN
10.0000 meq | INTRAVENOUS | Status: AC
  Administered 2021-03-20 (×4): 10 meq via INTRAVENOUS
  Filled 2021-03-20 (×4): qty 100

## 2021-03-20 MED ORDER — ONDANSETRON HCL 4 MG/2ML IJ SOLN
INTRAMUSCULAR | Status: AC
  Filled 2021-03-20: qty 2

## 2021-03-20 MED ORDER — FENTANYL CITRATE PF 50 MCG/ML IJ SOSY: 25.0000 ug | PREFILLED_SYRINGE | INTRAMUSCULAR | Status: DC | PRN

## 2021-03-20 MED ORDER — THIAMINE HCL 100 MG/ML IJ SOLN
100.0000 mg | Freq: Every day | INTRAMUSCULAR | Status: DC
  Administered 2021-03-21 – 2021-03-25 (×3): 100 mg via INTRAVENOUS
  Filled 2021-03-20 (×3): qty 2

## 2021-03-20 MED ORDER — HYDRALAZINE HCL 20 MG/ML IJ SOLN
10.0000 mg | Freq: Four times a day (QID) | INTRAMUSCULAR | Status: DC | PRN
Start: 2021-03-20 — End: 2021-03-29
  Administered 2021-03-20 – 2021-03-24 (×2): 10 mg via INTRAVENOUS
  Filled 2021-03-20 (×3): qty 1

## 2021-03-20 MED ORDER — FENTANYL CITRATE (PF) 100 MCG/2ML IJ SOLN
INTRAMUSCULAR | Status: DC | PRN
  Administered 2021-03-20: 25 ug via INTRAVENOUS
  Administered 2021-03-20: 50 ug via INTRAVENOUS

## 2021-03-20 SURGICAL SUPPLY — 47 items
85MM HELICAL BLADES ×1 IMPLANT
BAG COUNTER SPONGE SURGICOUNT (BAG) IMPLANT
BAG SPEC THK2 15X12 ZIP CLS (MISCELLANEOUS) ×1
BAG SPNG CNTER NS LX DISP (BAG)
BAG ZIPLOCK 12X15 (MISCELLANEOUS) ×2 IMPLANT
BIT DRILL CALIBRATED 4.2 (BIT) IMPLANT
BIT DRILL CANN 16 (BIT) ×1 IMPLANT
BLADE HELICAL TFNA 85 (Anchor) ×1 IMPLANT
BNDG COHESIVE 4X5 TAN ST LF (GAUZE/BANDAGES/DRESSINGS) ×2 IMPLANT
BNDG GAUZE ELAST 4 BULKY (GAUZE/BANDAGES/DRESSINGS) ×2 IMPLANT
COVER PERINEAL POST (MISCELLANEOUS) ×2 IMPLANT
COVER SURGICAL LIGHT HANDLE (MISCELLANEOUS) ×2 IMPLANT
DRAPE STERI IOBAN 125X83 (DRAPES) ×2 IMPLANT
DRESSING AQUACEL AG SP 3.5X4 (GAUZE/BANDAGES/DRESSINGS) IMPLANT
DRESSING MEPILEX FLEX 4X4 (GAUZE/BANDAGES/DRESSINGS) ×2 IMPLANT
DRILL BIT CALIBRATED 4.2 (BIT) ×2
DRSG AQUACEL AG ADV 3.5X 6 (GAUZE/BANDAGES/DRESSINGS) ×1 IMPLANT
DRSG AQUACEL AG SP 3.5X4 (GAUZE/BANDAGES/DRESSINGS) ×2
DRSG MEPILEX BORDER 4X8 (GAUZE/BANDAGES/DRESSINGS) ×2 IMPLANT
DRSG MEPILEX FLEX 4X4 (GAUZE/BANDAGES/DRESSINGS) ×4
DURAPREP 26ML APPLICATOR (WOUND CARE) ×2 IMPLANT
ELECT REM PT RETURN 15FT ADLT (MISCELLANEOUS) ×2 IMPLANT
GAUZE SPONGE 4X4 12PLY STRL (GAUZE/BANDAGES/DRESSINGS) ×2 IMPLANT
GAUZE XEROFORM 5X9 LF (GAUZE/BANDAGES/DRESSINGS) ×2 IMPLANT
GLOVE SRG 8 PF TXTR STRL LF DI (GLOVE) ×1 IMPLANT
GLOVE SURG ENC MOIS LTX SZ7 (GLOVE) ×2 IMPLANT
GLOVE SURG ORTHO LTX SZ7.5 (GLOVE) ×4 IMPLANT
GLOVE SURG UNDER POLY LF SZ7 (GLOVE) ×2 IMPLANT
GLOVE SURG UNDER POLY LF SZ8 (GLOVE) ×2
GOWN STRL REUS W/TWL LRG LVL3 (GOWN DISPOSABLE) ×4 IMPLANT
GUIDEWIRE 3.2X400 (WIRE) ×1 IMPLANT
KIT BASIN OR (CUSTOM PROCEDURE TRAY) ×2 IMPLANT
KIT TURNOVER KIT A (KITS) IMPLANT
NAIL CANN TFNA 9MM/130 DEG TI (Nail) ×1 IMPLANT
NS IRRIG 1000ML POUR BTL (IV SOLUTION) ×2 IMPLANT
PACK GENERAL/GYN (CUSTOM PROCEDURE TRAY) ×2 IMPLANT
PROTECTOR NERVE ULNAR (MISCELLANEOUS) ×4 IMPLANT
SCREW LOCK STAR 5X30 (Screw) ×1 IMPLANT
STAPLER VISISTAT 35W (STAPLE) IMPLANT
STRIP CLOSURE SKIN 1/2X4 (GAUZE/BANDAGES/DRESSINGS) ×3 IMPLANT
SUT MNCRL AB 4-0 PS2 18 (SUTURE) ×2 IMPLANT
SUT VIC AB 0 CT1 27 (SUTURE)
SUT VIC AB 0 CT1 27XBRD ANTBC (SUTURE) IMPLANT
SUT VIC AB 3-0 SH 27 (SUTURE) ×2
SUT VIC AB 3-0 SH 27X BRD (SUTURE) ×1 IMPLANT
TOWEL OR 17X26 10 PK STRL BLUE (TOWEL DISPOSABLE) ×2 IMPLANT
WATER STERILE IRR 1000ML POUR (IV SOLUTION) ×4 IMPLANT

## 2021-03-20 NOTE — ED Notes (Signed)
Patient agreed to allow this nurse to update daughter Loma Sousa, family requesting updates when able. Also states she has been recently treated for scabies and unsure how properly she was applying the cream at home. Floor coverage paged to make aware.

## 2021-03-20 NOTE — Transfer of Care (Signed)
Immediate Anesthesia Transfer of Care Note  Patient: Stacie [name]  Procedure(s) Performed: INTRAMEDULLARY (IM) NAIL FEMORAL (Right)  Patient Location: PACU  Anesthesia Type:General  Level of Consciousness: awake, alert , oriented and patient cooperative  Airway & Oxygen Therapy: Patient Spontanous Breathing and Patient connected to nasal cannula oxygen  Post-op Assessment: Report given to RN and Post -op Vital signs reviewed and stable  Post vital signs: Reviewed and stable  Last Vitals:  Vitals Value Taken Time  BP 123/71 03/20/21 1730  Temp    Pulse 77 03/20/21 1739  Resp 11 03/20/21 1739  SpO2 100 % 03/20/21 1739  Vitals shown include unvalidated device data.  Last Pain:  Vitals:   03/20/21 1146  TempSrc:   PainSc: Asleep      Patients Stated Pain Goal: 7 (56/86/16 8372)  Complications: No notable events documented.

## 2021-03-20 NOTE — Progress Notes (Signed)
Pt is showing red MEWS for her mentation status. She is resting peacefully, but is hard to wake up. MD notified. Protocol initiated.

## 2021-03-20 NOTE — Anesthesia Preprocedure Evaluation (Addendum)
Anesthesia Evaluation  Patient identified by MRN, date of birth, ID band Patient confused    Reviewed: Allergy & Precautions, Patient's Chart, lab work & pertinent test results, Unable to perform ROS - Chart review only  History of Anesthesia Complications Negative for: history of anesthetic complications  Airway Mallampati: Unable to assess  TM Distance: >3 FB Neck ROM: Full    Dental  (+) Dental Advisory Given   Pulmonary former smoker,    Pulmonary exam normal        Cardiovascular hypertension, Pt. on medications + Peripheral Vascular Disease and +CHF   Rhythm:Regular Rate:Tachycardia     Neuro/Psych PSYCHIATRIC DISORDERS Depression CVA, Residual Symptoms    GI/Hepatic (+)     substance abuse  alcohol use and marijuana use,   Endo/Other   Na 133 K 2.8 Ca 8.3   Renal/GU      Musculoskeletal   Abdominal   Peds  Hematology  (+) Blood dyscrasia, anemia ,   Anesthesia Other Findings   Reproductive/Obstetrics                            Anesthesia Physical Anesthesia Plan  ASA: 4  Anesthesia Plan: General   Post-op Pain Management: Ofirmev IV (intra-op)*   Induction: Intravenous  PONV Risk Score and Plan: 4 or greater and Treatment may vary due to age or medical condition and TIVA  Airway Management Planned: Oral ETT  Additional Equipment: None  Intra-op Plan:   Post-operative Plan: Extubation in OR  Informed Consent: I have reviewed the patients History and Physical, chart, labs and discussed the procedure including the risks, benefits and alternatives for the proposed anesthesia with the patient or authorized representative who has indicated his/her understanding and acceptance.   Patient has DNR.  Discussed DNR with power of attorney and Suspend DNR.   Dental advisory given and Consent reviewed with POA  Plan Discussed with: CRNA and Anesthesiologist  Anesthesia  Plan Comments: (IV tylenol started in preop. Consent obtained over telephone from daughter listed in chart.)      Anesthesia Quick Evaluation

## 2021-03-20 NOTE — Assessment & Plan Note (Signed)
Check prealbumin nutritional consult ordered

## 2021-03-20 NOTE — Consult Note (Signed)
ORTHOPAEDIC CONSULTATION  REQUESTING PHYSICIAN: Terrilee Croak, MD  Chief Complaint: right hip pain   HPI: Stacie [name] is a 66 y.o. female with history of alcohol abuse, CHF, HLD, HTN, depression who presented to the ED after a fall yesterday evening in her bedroom while intoxicated. She fell onto her right hip and was unable to ambulate. She is not on blood thinners. Pain is severe at right hip better with rest and pain medication, worse with movement.   Past Medical History:  Diagnosis Date   Colitis    Hyperlipidemia    Hypertension    Stroke [facility]) 04/2012   unknown type   Thyroid disease    History reviewed. No pertinent surgical history. Social History   Socioeconomic History   Marital status: Divorced    Spouse name: Not on file   Number of children: Not on file   Years of education: Not on file   Highest education level: Not on file  Occupational History   Occupation: retired  Tobacco Use   Smoking status: Former    Packs/day: 0.25    Years: 10.00    Pack years: 2.50    Types: Cigarettes    Quit date: 05/2019    Years since quitting: 1.8   Smokeless tobacco: Never  Vaping Use   Vaping Use: Never used  Substance and Sexual Activity   Alcohol use: Yes    Alcohol/week: 14.0 standard drinks    Types: 14 Cans of beer per week    Comment: 8 beers a week   Drug use: Yes    Frequency: 7.0 times per week    Types: Marijuana    Comment: to help with colitis- multiple times a day   Sexual activity: Not Currently  Other Topics Concern   Not on file  Social History Narrative   Not on file   Social Determinants of Health   Financial Resource Strain: Low Risk    Difficulty of Paying Living Expenses: Not hard at all  Food Insecurity: Not on file  Transportation Needs: Not on file  Physical Activity: Not on file  Stress: Not on file  Social Connections: Not on file   Family History  Problem Relation Age of Onset   Stroke Mother 50        embolic   Heart disease Father 20       CAd with CABG, no MI   Heart disease Sister 49       CAD   Heart disease Paternal Uncle    Heart disease Maternal Grandmother    Heart disease Paternal Grandfather    Colon cancer Neg Hx    Esophageal cancer Neg Hx    Stomach cancer Neg Hx    Allergies  Allergen Reactions   Trazodone And Nefazodone Nausea And Vomiting   Wellbutrin [Bupropion] Nausea And Vomiting     Positive ROS: All other systems have been reviewed and were otherwise negative with the exception of those mentioned in the HPI and as above.  Physical Exam: General: Alert, no acute distress. Laying in bed.  Cardiovascular: No pedal edema Respiratory: No cyanosis, no use of accessory musculature GI: No organomegaly, abdomen is soft and non-tender Skin: No lesions in the area of chief complaint Neurologic: Sensation intact distally Psychiatric: Patient is mumbling words, answers some questions but does not answer others.  Lymphatic: No axillary or cervical lymphadenopathy  MUSCULOSKELETAL: 2+ DP pulses bilaterally. RLE shortened and internally rotated. No TTP to greater trochanter of right  or left hip. Able to wiggle toes of bilateral feet. Not willing to dorsiflex or plantarflex when asked.   Imaging: Right intertrochanteric hip fracture with foreshortening and varus angulation. Mildly displaced left greater trochanter fracture.  Assessment: Right intertrochanteric hip fracture  Plan: - Keep npo for probably IM nail of right intertrochanteric hip fracture. Patient not competent for consent this morning, will try back later this afternoon. I have also put in a call with daughter Loma Sousa, so far with no answer.     Ventura Bruns, PA-C    03/20/2021 6:52 AM

## 2021-03-20 NOTE — Assessment & Plan Note (Signed)
Replace and recheck check magnesium and phosphate level

## 2021-03-20 NOTE — Progress Notes (Signed)
Pt arrived to 1408 from PACU in stable condition. Family at bedside. Coolidge Breeze, RN 03/20/2021

## 2021-03-20 NOTE — Assessment & Plan Note (Addendum)
-   management as per orthopedics,  plan to operate  in  a.m.   Keep nothing by mouth post midnight. Patient  not on anticoagulation  Hold aspirin Ordered type and screen,   order a vitamin D level  Patient at baseline  able to walk a flight of stairs or 100 feet      Patient denies any chest pain or shortness of breath currently and/or with exertion,  ECG showing no evidence of acute ischemia  Given pt does not know how she fell And was intoxicated get one trop   Given   patient is at least moderate  Risk  at this point no furthther cardiac workup is indicated.

## 2021-03-20 NOTE — Progress Notes (Signed)
PROGRESS NOTE  Stacie [name]  DOB: 1955/07/17  PCP: [name], NP ZJI:[id number]  DOA: 03/19/2021  LOS: 1 day  Hospital Day: 2  Brief narrative: Stacie [name] is a 66 y.o. female with PMH significant for HTN, HLD, stroke, diastolic CHF, depression, noncompliance to medication Patient presented to the ED on 03/19/2021 with fall at home while under the influence of alcohol leading to right hip pain and inability to ambulate.  Denies hitting head Not on blood thinners. In the ED, patient was hemodynamically stable.  Initial blood pressure was in 130s.   Initial labs with potassium low at 2.9, hemoglobin 10.2, WBC count 11.5 Right x-ray of the hips hip x-ray showed intertrochanteric right hip fracture with foreshortening and varus angulation as well as mildly displaced left greater trochanter fracture. CT head unremarkable Orthopedics was consulted from ED. Admitted to hospitalist service.  Subjective: Patient was seen and examined this morning.  Sleepy and spontaneously moving extremities.  Open eyes on verbal command.  Mumbles.  Unable to have a conversation.  Per nursing staff, her mental status has been fluctuating this morning between agitation and sleepiness. 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  Currently n.p.o. -Pain management DVT prophylaxis per orthopedics  Hypertensive urgency -Blood pressure running high and elevated above 200 overnight -Home meds include amlodipine 10 mg  daily, Imdur 30 mg daily, hydralazine 100 mg daily.  Unclear compliance -Resume all.  I will increase hydralazine to 50 mg 3 times daily at this time -As needed IV hydralazine  Acute metabolic encephalopathy -Patient's mental status this morning has been fluctuating between agitation and sleepiness.  This is likely from a combination of pain medicine, alcohol withdrawal as well as IV Ativan.   -Continue PRN IV Haldol.  -CT head on admission did not show any evidence of intracranial abnormality.  Chronic alcohol use -CIWA protocol with scheduled Librium and as needed Ativan. -Counseled to quit alcohol  Severe hypokalemia -Potassium level was low at 2.8 on last check this morning.  Replacement given. -Recheck Recent Labs  Lab 03/19/21 2241 03/20/21 0045  K 2.9* 2.8*  MG 1.9  --   PHOS 3.0  --    Chronic mild anemia Severe iron deficiency Severe vitamin B12 deficiency -Ferritin level low at 10, vitamin B12 level low at 61 -IV iron and IM vitamin B12 replacement ordered -Oral supplement at discharge Recent Labs    03/19/21 2241 03/20/21 0045  HGB 10.2* 10.3*  MCV 83.6 83.4  VITAMINB12 61*  --   FOLATE 11.4  --   FERRITIN 10*  --   TIBC 405  --   IRON 19*  --   RETICCTPCT 1.3  --    Chronic diastolic CHF -Currently compensated.  Last echo from 2021 with EF 60 to 17%, grade 1 diastolic dysfunction  History of stroke/hyperlipidemia Mild cognitive deficit -Currently aspirin on hold.  Resume statin  Severe protein calorie malnutrition -As evidenced by low weight of less than 100 pound, iron deficiency, vitamin B12 deficiency -Probably related to chronic alcohol use as  well -Nutrition consult  Mobility: Encourage ambulation.  Needs PT eval when mental status improves Goals of care   Code Status: DNR    Nutritional status:  There is no height or weight on file to calculate BMI.      Diet:  Diet Order             Diet NPO time specified  Diet effective now                    DVT prophylaxis:  SCDs Start: 03/20/21 0024   Antimicrobials: None Fluid: None Consultants: Orthopedics Family Communication: None at bedside  Status is: Inpatient  Continue in-hospital care because: Neurosurgical intervention Level of care: Telemetry   Dispo: The patient is from: Home              Anticipated d/c is to: Needs PT eval postprocedure              Patient currently is not medically stable to d/c.   Difficult to place patient No     Infusions:   ferric gluconate (FERRLECIT) IVPB 250 mg (03/20/21 1146)   methocarbamol (ROBAXIN) IV Stopped (03/20/21 0407)    Scheduled Meds:  amLODipine  10 mg Oral Daily   chlordiazePOXIDE  10 mg Oral TID   folic acid  1 mg Oral Daily   hydrALAZINE  50 mg Oral Q8H   isosorbide mononitrate  30 mg Oral Daily   multivitamin with minerals  1 tablet Oral Daily   simvastatin  20 mg Oral q1800   thiamine  100 mg Oral Daily   Or   thiamine  100 mg Intravenous Daily   [START ON 03/21/2021] vitamin B-12  1,000 mcg Oral Daily    PRN meds: haloperidol lactate, hydrALAZINE, HYDROcodone-acetaminophen, LORazepam **OR** LORazepam, methocarbamol **OR** methocarbamol (ROBAXIN) IV, morphine injection   Antimicrobials: Anti-infectives (From admission, onward)    None       Objective: Vitals:   03/20/21 0900 03/20/21 1130  BP: (!) 188/87 (!) 144/71  Pulse: 80 87  Resp:  16  Temp:    SpO2: 96% 98%    Intake/Output Summary (Last 24 hours) at 03/20/2021 1203 Last data filed at 03/20/2021 0548 Gross per 24 hour  Intake 450 ml  Output --  Net 450 ml   There were no vitals filed for this visit. Weight change:  There is no height or weight on file to calculate BMI.   Physical Exam: General exam: Elderly thin built Caucasian female.  Not in physical distress Skin: No rashes, lesions or ulcers. HEENT: Atraumatic, normocephalic, no obvious bleeding Lungs: Clear to auscultation bilaterally CVS: Regular rate  and rhythm, no murmur GI/Abd soft, nontender, nondistended, bowel sound present CNS: At the time of my evaluation, she was sleepy, spontaneous movement of extremities seen.  Open eyes on verbal command and fell back asleep quickly Psychiatry: Sad affect Extremities: No pedal edema, no calf tenderness  Data Review: I have personally reviewed the laboratory data and studies available.  F/u labs ordered Unresulted Labs (From admission, onward)     Start     Ordered   03/21/21 5053  Basic metabolic panel  Daily,   R      03/20/21 0922   03/21/21 0500  CBC with Differential/Platelet  Daily,   R      03/20/21 0922   03/20/21 0500  VITAMIN D 25 Hydroxy (Vit-D Deficiency, Fractures)  Tomorrow morning,   R  03/20/21 0024   03/20/21 0045  HIV Antibody (routine testing w rflx)  Once,   R        03/20/21 0045   Unscheduled  Occult blood card to lab, stool  As needed,   R      03/19/21 2354            Signed, Terrilee Croak, MD [facility] 03/20/2021

## 2021-03-20 NOTE — Anesthesia Postprocedure Evaluation (Signed)
Anesthesia Post Note  Patient: Stacie [name]  Procedure(s) Performed: INTRAMEDULLARY (IM) NAIL FEMORAL (Right)     Patient location during evaluation: PACU Anesthesia Type: General Level of consciousness: confused (as in preop) Pain management: pain level controlled Vital Signs Assessment: post-procedure vital signs reviewed and stable Respiratory status: spontaneous breathing, nonlabored ventilation, respiratory function stable and patient connected to nasal cannula oxygen Cardiovascular status: blood pressure returned to baseline and stable Postop Assessment: no apparent nausea or vomiting Anesthetic complications: no   No notable events documented.  Last Vitals:  Vitals:   03/20/21 1745 03/20/21 1800  BP: 106/67 123/84  Pulse: 77 80  Resp: 11 10  Temp:    SpO2: 99% 99%    Last Pain:  Vitals:   03/20/21 1800  TempSrc:   PainSc: [place]

## 2021-03-20 NOTE — Op Note (Signed)
DATE OF SURGERY:  03/20/2021  TIME: 5:07 PM  PATIENT NAME:  Stacie [name]  AGE: 66 y.o.  PRE-OPERATIVE DIAGNOSIS: Right basicervical femoral neck fracture  POST-OPERATIVE DIAGNOSIS:  SAME  PROCEDURE: Right hip intramedullary nail  SURGEON:  Johnny Bridge  ASSISTANT:  Merlene Pulling, PA-C, present and scrubbed throughout the case, critical for assistance with exposure, retraction, instrumentation, and closure.  OPERATIVE IMPLANTS: Synthes TFN - A short nail, with a size 85 mm helical blade, and a size 30 distal interlocking bolt, the nail diameter was 9 mm.  UNIQUE ASPECTS OF THE CASE: This was an extremely unstable basicervical fracture that was very difficult to reduce.  The fracture continued to want to extend on the lateral view, and go into significant valgus on the AP view.  I had to use a bone hook around the medial calcar to bring the relationship of the femoral head into a much more normal alignment.  Her canal was also quite small.  ESTIMATED BLOOD LOSS: 75 mL  PREOPERATIVE INDICATIONS:  Stacie [name] is a 66 y.o. year old who fell and suffered a hip fracture. She was brought into the ER and then admitted and optimized and then elected for surgical intervention.  She has significant alcoholism, and also previous stroke.  The risks benefits and alternatives were discussed with the patient and her family including but not limited to the risks of nonoperative treatment, versus surgical intervention including infection, bleeding, nerve injury, malunion, nonunion, hardware prominence, hardware failure, need for hardware removal, blood clots, cardiopulmonary complications, morbidity, mortality, among others, and they were willing to proceed.    OPERATIVE PROCEDURE:  The patient was brought to the operating room and placed in the supine position. Anesthesia was administered. She was placed on the fracture table.  Closed reduction was performed under C-arm  guidance.  Time out was then performed after sterile prep and drape. She received preoperative antibiotics.  Incision was made proximal to the greater trochanter. A guidewire was placed in the appropriate position. Confirmation was made on AP and lateral views.  The above-named nail was opened. I opened the proximal femur with a reamer. I then placed the nail by hand down. I did not need to ream the femur although I did go deep with the opening drill, and also tried to prepare the lateral aspect of the neck with the solid drill.  Once the nail was completely seated, I placed a guidepin into the femoral head into the center center position.  This was challenging, and I ultimately had to make a small incision proximally and slide a bone hook around the medial calcar to reduce the fracture.  I measured the length, and then reamed the lateral cortex and up into the head. I then placed the cephalomedullary screw. Slight compression was applied. Anatomic fixation achieved. Bone quality was mediocre.  I then secured the proximal interlocking bolt, and locked the nail distally using the jig.  I took final C-arm pictures AP and lateral.  I used the jig to place a distal interlocking bolt.  Anatomic reconstruction was achieved, and the wounds were irrigated copiously and closed with Vicryl followed by Steri-Strips and sterile gauze for the skin. The patient was awakened and returned to PACU in stable and satisfactory condition. There were no complications and the patient tolerated the procedure well.  She will be weightbearing as tolerated, and will be on Lovenox or some sort of chemoprophylaxis for a period of four weeks after discharge.  Marchia Bond, M.D.

## 2021-03-20 NOTE — Progress Notes (Signed)
I had a discussion with the patient's daughter, power of attorney, regarding surgical options.  They have elected for intramedullary nail fixation right hip.  The risks benefits and alternatives were discussed with the patient including but not limited to the risks of nonoperative treatment, versus surgical intervention including infection, bleeding, nerve injury, malunion, nonunion, the need for revision surgery, hardware prominence, hardware failure, the need for hardware removal, blood clots, cardiopulmonary complications, morbidity, mortality, among others, and they were willing to proceed.    Stacie Bridge, MD

## 2021-03-20 NOTE — ED Notes (Signed)
Spoke to pt's daughter [name] to obtain consent for surgical procedure by Dr. Mardelle Matte. Megan gave verbal consent over the phone to this Probation officer and [employer], Therapist, sports.

## 2021-03-20 NOTE — Progress Notes (Signed)
Patient transported from ER to peri op for hip pinning. Patient does not open eyes to voice and does not move when wiped with CHG clothes. Oral care with CHG oral rinse and oral swab. Called Dr Fransisco Beau about changing oral tylenol to IV tylenol. New order obtained.

## 2021-03-20 NOTE — Progress Notes (Signed)
Discussed with daughter Loma Sousa over the phone. Loma Sousa states that patient lives at home with her ex-husband who likely may not be able to provide full time care for her on discharge. She does not walk with an assistive device at baseline. She does at times have gait abnormalities due to previous stroke, but does not normally have falls unless she has been drinking. Discussed risks, benefits, and alternatives of right hip IM nail with Loma Sousa and she understands plan.   Merlene Pulling, PA-C

## 2021-03-20 NOTE — Anesthesia Procedure Notes (Addendum)
Procedure Name: Intubation Date/Time: 03/20/2021 3:53 PM Performed by: Cleda Daub, CRNA Pre-anesthesia Checklist: Patient identified, Emergency Drugs available, Suction available and Patient being monitored Patient Re-evaluated:Patient Re-evaluated prior to induction Oxygen Delivery Method: Circle system utilized Preoxygenation: Pre-oxygenation with 100% oxygen Induction Type: IV induction Ventilation: Mask ventilation without difficulty Laryngoscope Size: Mac and 3 Grade View: Grade I Tube type: Oral Number of attempts: 1 Airway Equipment and Method: Stylet and Oral airway Placement Confirmation: ETT inserted through vocal cords under direct vision, positive ETCO2 and breath sounds checked- equal and bilateral Secured at: 20 cm Tube secured with: Tape Dental Injury: Teeth and Oropharynx as per pre-operative assessment

## 2021-03-20 NOTE — Progress Notes (Signed)
Patient seen and examined.  Currently in mittens, unresponsive to organize discussion, but does have right hip pain.  Plan for surgical intervention as discussed above.  Right hip intramedullary nail fixation.  Discussed with the family.  Marchia Bond, MD

## 2021-03-21 ENCOUNTER — Encounter (HOSPITAL_COMMUNITY): Payer: Self-pay | Admitting: Orthopedic Surgery

## 2021-03-21 LAB — CBC WITH DIFFERENTIAL/PLATELET
Abs Immature Granulocytes: 0.09 10*3/uL — ABNORMAL HIGH (ref 0.00–0.07)
Basophils Absolute: 0 10*3/uL (ref 0.0–0.1)
Basophils Relative: 0 %
Eosinophils Absolute: 0 10*3/uL (ref 0.0–0.5)
Eosinophils Relative: 0 %
HCT: 32 % — ABNORMAL LOW (ref 36.0–46.0)
Hemoglobin: 10.4 g/dL — ABNORMAL LOW (ref 12.0–15.0)
Immature Granulocytes: 1 %
Lymphocytes Relative: 8 %
Lymphs Abs: 1 10*3/uL (ref 0.7–4.0)
MCH: 26.9 pg (ref 26.0–34.0)
MCHC: 32.5 g/dL (ref 30.0–36.0)
MCV: 82.9 fL (ref 80.0–100.0)
Monocytes Absolute: 1.4 10*3/uL — ABNORMAL HIGH (ref 0.1–1.0)
Monocytes Relative: 11 %
Neutro Abs: 10.3 10*3/uL — ABNORMAL HIGH (ref 1.7–7.7)
Neutrophils Relative %: 80 %
Platelets: 404 10*3/uL — ABNORMAL HIGH (ref 150–400)
RBC: 3.86 MIL/uL — ABNORMAL LOW (ref 3.87–5.11)
RDW: 13.3 % (ref 11.5–15.5)
WBC: 12.9 10*3/uL — ABNORMAL HIGH (ref 4.0–10.5)
nRBC: 0 % (ref 0.0–0.2)

## 2021-03-21 LAB — BASIC METABOLIC PANEL
Anion gap: 10 (ref 5–15)
BUN: 16 mg/dL (ref 8–23)
CO2: 23 mmol/L (ref 22–32)
Calcium: 7.7 mg/dL — ABNORMAL LOW (ref 8.9–10.3)
Chloride: 98 mmol/L (ref 98–111)
Creatinine, Ser: 0.66 mg/dL (ref 0.44–1.00)
GFR, Estimated: >60 mL/min (ref >60)
Glucose, Bld: 122 mg/dL — ABNORMAL HIGH (ref 70–99)
Potassium: 3.2 mmol/L — ABNORMAL LOW (ref 3.5–5.1)
Sodium: 131 mmol/L — ABNORMAL LOW (ref 135–145)

## 2021-03-21 LAB — MAGNESIUM: Magnesium: 1.8 mg/dL (ref 1.7–2.4)

## 2021-03-21 MED ORDER — ORAL CARE MOUTH RINSE
15.0000 mL | Freq: Two times a day (BID) | OROMUCOSAL | Status: DC
  Administered 2021-03-21 – 2021-03-27 (×11): 15 mL via OROMUCOSAL

## 2021-03-21 MED ORDER — VITAMIN D (ERGOCALCIFEROL) 1.25 MG (50000 UNIT) PO CAPS
50000.0000 [IU] | ORAL_CAPSULE | ORAL | Status: DC
  Administered 2021-03-21 – 2021-03-28 (×2): 50000 [IU] via ORAL
  Filled 2021-03-21 (×2): qty 1

## 2021-03-21 MED ORDER — ENSURE ENLIVE PO LIQD
237.0000 mL | Freq: Three times a day (TID) | ORAL | Status: DC
  Administered 2021-03-21 – 2021-03-28 (×15): 237 mL via ORAL

## 2021-03-21 MED ORDER — POTASSIUM CHLORIDE 20 MEQ PO PACK
40.0000 meq | PACK | Freq: Two times a day (BID) | ORAL | Status: AC
  Administered 2021-03-21: 40 meq via ORAL
  Filled 2021-03-21: qty 2

## 2021-03-21 NOTE — Discharge Instructions (Signed)
[facility] Stay Proper nutrition can help your body recover from illness and injury.   Foods and beverages high in protein, vitamins, and minerals help rebuild muscle loss, promote healing, & reduce fall risk.   In addition to eating healthy foods, a nutrition shake is an easy, delicious way to get the nutrition you need during and after your hospital stay  It is recommended that you continue to drink 3 bottles per day of: Ensure for at least 1 month (30 days) after your hospital stay   Tips for adding a nutrition shake into your routine: As allowed, drink one with vitamins or medications instead of water or juice Enjoy one as a tasty mid-morning or afternoon snack Drink cold or make a milkshake out of it Drink one instead of milk with cereal or snacks Use as a coffee creamer   Available at the following grocery stores and pharmacies:           * [employer] [phone]            For COUPONS visit: www.ensure.com/join or [email or web address]   Suggested Substitutions Ensure Plus = Boost Plus = Carnation Breakfast Essentials = Boost Compact Ensure Active Clear = Boost Breeze Glucerna Shake = Boost Glucose Control = Carnation Breakfast Essentials SUGAR FREE  Suggestions For Increasing Calories And Protein Several small meals a day are easier to eat and digest than three large ones. Space meals about 2 to 3 hours apart to maximize comfort. Stop eating 2 to 3 hours before bed and sleep with your head elevated if gastric reflux (GERD) and heartburn are problems. Do not eat your favorite foods if you are feeling bad. Save them for when you feel good! Eat breakfast-type foods at any meal. Eggs are usually easy to eat and are great any time of the day. (The same goes for pancakes and waffles.) Eat when you  feel hungry. Most people have the greatest appetite in the morning because they have not eaten all night. If this is the best meal for you, then pile on those calories and other nutrients in the morning and at lunch. Then you can have a smaller dinner without losing total calories for the day. Eat leftovers or nutritious snacks in the afternoon and early evening to round out your day. Try homemade or commercially prepared nutrition bars and puddings, as well as calorie- and protein-rich liquid nutritional supplements. Benefits of Physical Activity Talk to your doctor about physical activity. Light or moderate physical activity can help maintain muscle and promote an appetite. Walking in the neighborhood or the local mall is a great way to get up, get out, and get moving. If you are unsteady on your feet, try walking around the dining room table. Save Room for [employer]! Drink most fluids between meals instead of with meals. (It is fine to have a sip to help swallow food at meal time.) Fluids (which usually have fewer calories and nutrients than solid food) can take up valuable space in your stomach.  Foods Recommended High-Protein Foods Milk products Add cheese to toast, crackers, sandwiches, baked potatoes, vegetables, soups, noodles, meat, and fruit. Use reduced-fat (2%) or whole milk in place of water  when cooking cereal and cream soups. Include cream sauces on vegetables and pasta. Add powdered milk to cream soups and mashed potatoes.  Eggs Have hard-cooked eggs readily available in the refrigerator. Chop and add to salads, casseroles, soups, and vegetables. Make a quick egg salad. All eggs should be well cooked to avoid the risk of harmful bacteria.  Meats, poultry, and fish Add leftover cooked meats to soups, casseroles, salads, and omelets. Make dip by mixing diced, chopped, or shredded meat with sour cream and spices.  Beans, legumes, nuts, and seeds Sprinkle nuts and seeds on cereals,  fruit, and desserts such as ice cream, pudding, and custard. Also serve nuts and seeds on vegetables, salads, and pasta. Spread peanut butter on toast, bread, English muffins, and fruit, or blend it in a milk shake. Add beans and peas to salads, soups, casseroles, and vegetable dishes.  High-Calorie Foods Butter, margarine, and  oils Melt butter or margarine over potatoes, rice, pasta, and cooked vegetables. Add melted butter or margarine into soups and casseroles and spread on bread for sandwiches before spreading sandwich spread or peanut butter. Saut or stir-fry vegetables, meats, chicken and fish such as shrimp/scallops in olive or canola oil. A variety of oils add calories and can be used to [employer], chicken, or fish.  Milk products Add whipping cream to desserts, pancakes, waffles, fruit, and hot chocolate, and fold it into soups and casseroles. Add sour cream to baked potatoes and vegetables.  Salad dressing Use regular (not low-fat or diet) mayonnaise and salad dressing on sandwiches and in dips with vegetables and fruit.   Sweets Add jelly and honey to bread and crackers. Add jam to fruit and ice cream and as a topping over cake.   Copyright 2020  Academy of Nutrition and Dietetics. All rights reserved.

## 2021-03-21 NOTE — Consult Note (Signed)
° °  [facility] CM Inpatient Consult   03/21/2021  Stacie [name] 06-Feb-1955 [id number]  *Coverage for [employer] [ACO] Patient: [employer]  Primary Care Provider:  Lorayne Marek, Charlene Brooke, NP, [facility], is an embedded provider with a Chronic Care Management team and program, and is listed for the transition of care follow up and appointments.  Patient was screened for Embedded practice service needs for chronic care management and the Embedded Pharmacist has attempted CCM follow up with some difficulty noted in maintaining contact. Patient is currently being recommended for [facility] SN[facility] for rehab.  Plan: Will follow for disposition and notification can be sent to Embedded pharmacist, if patient goes to a [facility] affiliated facility then [facility] RN can be notified for follow up for returning to community needs.  Please contact for further questions,  Natividad Brood, RN BSN [facility] Liaison  [phone] business mobile phone Toll free office [phone]  Fax number: [phone] [email or web address]

## 2021-03-21 NOTE — Progress Notes (Signed)
Initial Nutrition Assessment  DOCUMENTATION CODES:  Severe malnutrition in context of chronic illness, Underweight  INTERVENTION:  Continue regular diet.  Add Ensure Plus High Protein po TID, each supplement provides 350 kcal and 20 grams of protein.   Continue CIWA protocol.  Continue vitamin supplementation as needed.  Recommend feeding assistance with meals and supplements.  Encourage PO and supplement intake.  NUTRITION DIAGNOSIS:  Severe Malnutrition related to chronic illness (EtOH abuse) as evidenced by severe fat depletion, severe muscle depletion.  GOAL:  Patient will meet greater than or equal to 90% of their needs  MONITOR:  PO intake, Supplement acceptance, Labs, Weight trends, I & O's, Skin  REASON FOR ASSESSMENT:  Consult Assessment of nutrition requirement/status, Hip fracture protocol  ASSESSMENT:  66 yo female with a PMH of EtOH abuse, cannabis use, colitis, dCHF, HLD, HTN, cognitive deficit due to previous CVA, and depression who presents after a fall in her bedroom while intoxicated. Admitted with bilateral hip fractures. 2/16 - IM nail to R hip  Attempted to speak with pt at bedside. Pt very lethargic, asking to be moved to chair - had to repeatedly tell patient that PT would have to move her. Pt a bit agitated by this.  No meal documentation recorded.  Spoke with RN. RN reports that pt has not been eating well.  Per Epic, pt appears to have always been smaller. Pt has appeared to have gained weight since 11/2020. However, given its exactness, pt's weight is likely stated.  RD to order measured weight to determine changes.  RD to recommend feeding assistance to aid in PO intake.  Pt reports liking strawberry Ensure.  Spoke with MD regarding repletion of Vitamin D - it is recommended that pt receive 50,000 units weekly for 7 weeks. MD placed orders for this.  Medications: reviewed; colace BID, folic acid, MVI with minerals, Klor-Con 40 mEq once,  Senokot BID, thiamine, Vitamin B12, NaCl with K-Cl 20 mEq @ 75 ml/hr, ferric gluconate per IV, Vicodin PO PRN (given once today)  Labs: reviewed; Na 131 (L), K 3.2 (L), Vitamin D 25-Hydroxy 17.88 (L), Vitamin B12 61 (L), Glucose 122 (H)  NUTRITION - FOCUSED PHYSICAL EXAM: Flowsheet Row Most Recent Value  Orbital Region Severe depletion  Upper Arm Region Severe depletion  Thoracic and Lumbar Region Severe depletion  Buccal Region Severe depletion  Temple Region Moderate depletion  Clavicle Bone Region Severe depletion  Clavicle and Acromion Bone Region Severe depletion  Scapular Bone Region Severe depletion  Dorsal Hand Severe depletion  Patellar Region Severe depletion  Anterior Thigh Region Severe depletion  Posterior Calf Region Severe depletion  Edema (RD Assessment) None  Hair Reviewed  Eyes Reviewed  [Very dry lips]  Mouth Reviewed  Skin Reviewed  Nails Reviewed   Diet Order:   Diet Order             Diet regular Room service appropriate? Yes; Fluid consistency: Thin  Diet effective now                  EDUCATION NEEDS:  Education needs have been addressed  Skin:  Skin Assessment: Skin Integrity Issues: Skin Integrity Issues:: Incisions Incisions: R hip, closed (2/16)  Last BM:  no BM documented  Height:  Ht Readings from Last 1 Encounters:  03/20/21 5' 4.25" (1.632 m)   Weight:  Wt Readings from Last 1 Encounters:  03/20/21 43.1 kg   BMI:  Body mass index is 16.18 kg/m.  Estimated Nutritional Needs:  Kcal:  1800-2000 Protein:  75-90 grams Fluid:  >1.8 L  Derrel Nip, RD, LDN (she/her/hers) Clinical Inpatient Dietitian RD Pager/After-Hours/Weekend Pager # in [place]

## 2021-03-21 NOTE — Plan of Care (Signed)
°  Problem: Clinical Measurements: Goal: Will remain free from infection Outcome: Progressing   Problem: Coping: Goal: Level of anxiety will decrease Outcome: Progressing   Problem: Pain Managment: Goal: General experience of comfort will improve Outcome: Progressing   Problem: Nutrition: Goal: Adequate nutrition will be maintained Outcome: Not Progressing   Problem: Clinical Measurements: Goal: Will remain free from infection Outcome: Progressing   Problem: Coping: Goal: Level of anxiety will decrease Outcome: Progressing   Problem: Pain Managment: Goal: General experience of comfort will improve Outcome: Progressing

## 2021-03-21 NOTE — Progress Notes (Signed)
Subjective: 1 Day Post-Op s/p Procedure(s): INTRAMEDULLARY (IM) NAIL FEMORAL   Patient is somnolent, but easily awoken. Denies left or right pain. Denies chest pain, SOB. States she had some nausea last night but this has resolved. No other complaints.  Objective:  PE: VITALS:   Vitals:   03/20/21 2200 03/20/21 2307 03/21/21 0231 03/21/21 0604  BP: (!) 167/86 (!) 179/93 (!) 145/81 102/85  Pulse: 91 85 84 94  Resp: 19 13 14 18   Temp: 98 F (36.7 C) 99.2 F (37.3 C) 99 F (37.2 C) 98.6 F (37 C)  TempSrc: Axillary Axillary Axillary Axillary  SpO2: 99% 100% 99% 99%  Weight:      Height:        Sensation intact distally Intact pulses distally Dorsiflexion/Plantar flexion intact Incision: dressing C/D/I  LABS  Results for orders placed or performed during the hospital encounter of 03/19/21 (from the past 24 hour(s))  Basic metabolic panel     Status: Abnormal   Collection Time: 03/21/21  4:47 AM  Result Value Ref Range   Sodium 131 (L) 135 - 145 mmol/L   Potassium 3.2 (L) 3.5 - 5.1 mmol/L   Chloride 98 98 - 111 mmol/L   CO2 23 22 - 32 mmol/L   Glucose, Bld 122 (H) 70 - 99 mg/dL   BUN 16 8 - 23 mg/dL   Creatinine, Ser 0.66 0.44 - 1.00 mg/dL   Calcium 7.7 (L) 8.9 - 10.3 mg/dL   GFR, Estimated >60 >60 mL/min   Anion gap 10 5 - 15  CBC with Differential/Platelet     Status: Abnormal   Collection Time: 03/21/21  4:47 AM  Result Value Ref Range   WBC 12.9 (H) 4.0 - 10.5 K/uL   RBC 3.86 (L) 3.87 - 5.11 MIL/uL   Hemoglobin 10.4 (L) 12.0 - 15.0 g/dL   HCT 32.0 (L) 36.0 - 46.0 %   MCV 82.9 80.0 - 100.0 fL   MCH 26.9 26.0 - 34.0 pg   MCHC 32.5 30.0 - 36.0 g/dL   RDW 13.3 11.5 - 15.5 %   Platelets 404 (H) 150 - 400 K/uL   nRBC 0.0 0.0 - 0.2 %   Neutrophils Relative % 80 %   Neutro Abs 10.3 (H) 1.7 - 7.7 K/uL   Lymphocytes Relative 8 %   Lymphs Abs 1.0 0.7 - 4.0 K/uL   Monocytes Relative 11 %   Monocytes Absolute 1.4 (H) 0.1 - 1.0 K/uL   Eosinophils Relative 0 %    Eosinophils Absolute 0.0 0.0 - 0.5 K/uL   Basophils Relative 0 %   Basophils Absolute 0.0 0.0 - 0.1 K/uL   Immature Granulocytes 1 %   Abs Immature Granulocytes 0.09 (H) 0.00 - 0.07 K/uL  Magnesium     Status: None   Collection Time: 03/21/21  4:47 AM  Result Value Ref Range   Magnesium 1.8 1.7 - 2.4 mg/dL    DG Chest 1 View  Result Date: 03/19/2021 CLINICAL DATA:  Fall and right femur fracture. EXAM: CHEST  1 VIEW COMPARISON:  Chest radiograph dated 09/17/2015. FINDINGS: No focal consolidation, pleural effusion, pneumothorax. The cardiac silhouette is within normal limits. No acute osseous pathology. IMPRESSION: No active disease. Electronically Signed   By: Anner Crete M.D.   On: 03/19/2021 22:45   CT HEAD WO CONTRAST (5MM)  Result Date: 03/20/2021 CLINICAL DATA:  Head trauma, minor (Age >= 65y) fall, bilateral hip fractures, EtOH on board EXAM: CT HEAD WITHOUT CONTRAST TECHNIQUE:  Contiguous axial images were obtained from the base of the skull through the vertex without intravenous contrast. RADIATION DOSE REDUCTION: This exam was performed according to the departmental dose-optimization program which includes automated exposure control, adjustment of the mA and/or kV according to patient size and/or use of iterative reconstruction technique. COMPARISON:  CT head 05/04/2019 BRAIN: BRAIN Patchy and confluent areas of decreased attenuation are noted throughout the deep and periventricular white matter of the cerebral hemispheres bilaterally, compatible with chronic microvascular ischemic disease. Chronic right corona radiata/basal ganglia infarction. No evidence of large-territorial acute infarction. No parenchymal hemorrhage. No mass lesion. No extra-axial collection. No mass effect or midline shift. No hydrocephalus. Basilar cisterns are patent. Vascular: No hyperdense vessel. Skull: No acute fracture or focal lesion. Sinuses/Orbits: Paranasal sinuses and mastoid air cells are clear. The  orbits are unremarkable. Other: None. IMPRESSION: No acute intracranial abnormality. Electronically Signed   By: Iven Finn M.D.   On: 03/20/2021 00:15   DG C-Arm 1-60 Min-No Report  Result Date: 03/20/2021 Fluoroscopy was utilized by the requesting physician.  No radiographic interpretation.   DG C-Arm 1-60 Min-No Report  Result Date: 03/20/2021 Fluoroscopy was utilized by the requesting physician.  No radiographic interpretation.   DG HIP OPERATIVE UNILAT W OR W/O PELVIS RIGHT  Result Date: 03/20/2021 CLINICAL DATA:  RIGHT hip repair EXAM: OPERATIVE RIGHT HIP (WITH PELVIS IF PERFORMED) 7 VIEWS TECHNIQUE: Fluoroscopic spot image(s) were submitted for interpretation post-operatively. 03/19/2021 COMPARISON:  Fluoroscopy time: 1 minute 25 seconds Dose: 4.5499 mGy FINDINGS: Intertrochanteric fracture RIGHT femur, nondisplaced. Osseous demineralization. Images demonstrate placement of an IM nail with a locking screw and a compression screw at the proximal RIGHT femur. No additional fracture or dislocation. IMPRESSION: Post ORIF intertrochanteric fracture RIGHT femur. Electronically Signed   By: [name] Dana M.D.   On: 03/20/2021 17:30   DG HIP UNILAT WITH PELVIS 2-3 VIEWS RIGHT  Result Date: 03/20/2021 CLINICAL DATA:  S/P RIGHT IM nail - images obtained bedside in PACU EXAM: DG HIP (WITH OR WITHOUT PELVIS) 2-3V RIGHT COMPARISON:  None. FINDINGS: Interval intramedullary nail fixation of a right intertrochanteric fracture. There is no evidence of new acute hip fracture or dislocation. There is no evidence of severe arthropathy or other focal bone abnormality. IMPRESSION: Interval intramedullary nail fixation of a right intertrochanteric fracture. Electronically Signed   By: Iven Finn M.D.   On: 03/20/2021 18:46   DG Hip Unilat W or Wo Pelvis 2-3 Views Right  Result Date: 03/19/2021 CLINICAL DATA:  Fall, right thigh pain EXAM: DG HIP (WITH OR WITHOUT PELVIS) 2-3V RIGHT COMPARISON:  None.  FINDINGS: Right intertrochanteric hip fracture with foreshortening and varus angulation. Mildly displaced left greater trochanter fracture. Bilateral hip joint spaces are preserved. Visualized bony pelvis appears intact. IMPRESSION: Right intertrochanteric hip fracture, as above. Mildly displaced left greater trochanter fracture. Electronically Signed   By: Julian Hy M.D.   On: 03/19/2021 22:45   DG Femur Min 2 Views Right  Result Date: 03/19/2021 CLINICAL DATA:  Fall, right thigh pain EXAM: RIGHT FEMUR 2 VIEWS COMPARISON:  None. FINDINGS: Intertrochanteric right hip fracture with foreshortening and varus angulation. No fracture or dislocation at the knee. IMPRESSION: Negative. Electronically Signed   By: Julian Hy M.D.   On: 03/19/2021 22:45    Assessment/Plan: Right hip fracture  1 Day Post-Op s/p Procedure(s): INTRAMEDULLARY (IM) NAIL FEMORAL  Weightbearing: WBAT RLE Insicional and dressing care: Dressings left intact until follow-up VTE prophylaxis: Lovenox 40mg  qd x 30 days Pain control:  continue current regimen Follow - up plan: 2 weeks with Dr. Mardelle Matte Dispo: pending PT/OT eval today  Contact information:   Weekdays 8-5 Merlene Pulling, PA-C [phone] A fter hours and holidays please check [email or web address] for group call information for [facility]  Ventura Bruns 03/21/2021, 8:15 AM

## 2021-03-21 NOTE — Evaluation (Signed)
Physical Therapy Evaluation Patient Details Name: Stacie [name] MRN: [id number] DOB: 1955/08/09 Today's Date: 03/21/2021  History of Present Illness  Pt. is a 66 y.o. female [facility] for a fall onto her right hip while intoxicated which she was unable to ambulate afterwards. Right hip intramedullary nail performed on 2/16 for femoral neck fracture, WBAT per op note. PMH significant of alcohol abuse, diastolic CHF, HLD, HTN, depression. Has B mild weakness she reports from a previous stroke. She also reports if she doesn't drink she gets occasional shakes but has never had a seizure or withdraw symptoms.  Clinical Impression   Pt presents with R hip pain, impaired activity tolerance, AMS, moderate physical assist for mobility. Pt to benefit from acute PT to address deficits. Pt requiring mod +2 for squat pivot level of mobility today, limited by pain and mental status. PT recommending ST-SNF to address deficits.  PT to progress mobility as tolerated, and will continue to follow acutely.         Recommendations for follow up therapy are one component of a multi-disciplinary discharge planning process, led by the attending physician.  Recommendations may be updated based on patient status, additional functional criteria and insurance authorization.  Follow Up Recommendations Skilled nursing-short term rehab (<3 hours/day)    Assistance Recommended at Discharge Frequent or constant Supervision/Assistance  Patient can return home with the following  A lot of help with walking and/or transfers;A lot of help with bathing/dressing/bathroom;Direct supervision/assist for financial management;Direct supervision/assist for medications management;Assist for transportation    Equipment Recommendations Other (comment) (tbd)  Recommendations for Other Services       Functional Status Assessment Patient has had a recent decline in their functional status and demonstrates the  ability to make significant improvements in function in a reasonable and predictable amount of time.     Precautions / Restrictions Precautions Precautions: Fall Restrictions Weight Bearing Restrictions: Yes RLE Weight Bearing: Weight bearing as tolerated LLE Weight Bearing: Weight bearing as tolerated (no orders addressing WB status, PA [employer] confirms WBAT)      Mobility  Bed Mobility Overal bed mobility: Needs Assistance Bed Mobility: Supine to Sit, Sit to Supine     Supine to sit: Mod assist, HOB elevated Sit to supine: Mod assist, HOB elevated   General bed mobility comments: assist for trunk and LE management, pt initially resistant and swinging at PT when assisting to EOB but calmed with time.    Transfers Overall transfer level: Needs assistance Equipment used: 2 person hand held assist Transfers: Bed to chair/wheelchair/BSC       Squat pivot transfers: Mod assist, +2 physical assistance     General transfer comment: mod +2 for squat pivot to/from [facility] for bodyweight support, steadying, and safe placement on support surface.    Ambulation/Gait                  Stairs            Wheelchair Mobility    Modified Rankin (Stroke Patients Only)       Balance Overall balance assessment: Needs assistance, History of Falls Sitting-balance support: No upper extremity supported, Feet supported Sitting balance-Leahy Scale: Fair Sitting balance - Comments: able to sit EOB without support   Standing balance support: Bilateral upper extremity supported, During functional activity Standing balance-Leahy Scale: Zero Standing balance comment: could not come to full standing on eval  Pertinent Vitals/Pain Pain Assessment Pain Assessment: Faces Faces Pain Scale: Hurts even more Pain Location: R hip Pain Descriptors / Indicators: Crying, Discomfort, Guarding Pain Intervention(s): Limited activity within  patient's tolerance, Monitored during session, Repositioned    Home Living Family/patient expects to be discharged to:: Private residence Living Arrangements: Spouse/significant other Available Help at Discharge: Family Type of Home: House Home Access: Stairs to enter Entrance Stairs-Rails: Psychiatric nurse of Steps: 3   Home Layout: Able to live on main level with bedroom/bathroom        Prior Function               Mobility Comments: unsure, pt limited in subjective answers by pain. above information taken from prior chart review. ADLs Comments: unsure     Hand Dominance   Dominant Hand: Right    Extremity/Trunk Assessment   Upper Extremity Assessment Upper Extremity Assessment: Defer to OT evaluation    Lower Extremity Assessment Lower Extremity Assessment: Generalized weakness;RLE deficits/detail RLE Deficits / Details: limited by pain guarding; able to perform LAQ to lacking 15* knee extension    Cervical / Trunk Assessment Cervical / Trunk Assessment: Normal  Communication   Communication: No difficulties  Cognition Arousal/Alertness: Awake/alert Behavior During Therapy: Restless Overall Cognitive Status: Impaired/Different from baseline Area of Impairment: Orientation, Attention, Memory, Following commands, Safety/judgement                 Orientation Level: Disoriented to, Time Current Attention Level: Focused Memory: Decreased short-term memory Following Commands: Follows one step commands inconsistently Safety/Judgement: Decreased awareness of safety, Decreased awareness of deficits     General Comments: A&Ox3, states year is 2022. Pt impulsive and requires max cuing to stay on task.        General Comments      Exercises     Assessment/Plan    PT Assessment Patient needs continued PT services  PT Problem List Decreased strength;Decreased mobility;Decreased safety awareness;Decreased activity tolerance;Decreased  balance;Decreased knowledge of use of DME;Pain;Decreased knowledge of precautions;Decreased range of motion       PT Treatment Interventions DME instruction;Therapeutic activities;Gait training;Therapeutic exercise;Patient/family education;Balance training;Functional mobility training;Neuromuscular re-education    PT Goals (Current goals can be found in the Care Plan section)  Acute Rehab PT Goals PT Goal Formulation: With patient Time For Goal Achievement: 04/04/21 Potential to Achieve Goals: Good    Frequency Min 3X/week     Co-evaluation               AM-PAC PT "6 Clicks" Mobility  Outcome Measure Help needed turning from your back to your side while in a flat bed without using bedrails?: A Lot Help needed moving from lying on your back to sitting on the side of a flat bed without using bedrails?: A Lot Help needed moving to and from a bed to a chair (including a wheelchair)?: A Lot Help needed standing up from a chair using your arms (e.g., wheelchair or bedside chair)?: A Lot Help needed to walk in hospital room?: Total Help needed climbing 3-5 steps with a railing? : Total 6 Click Score: 10    End of Session   Activity Tolerance: Patient limited by pain Patient left: in bed;with call bell/phone within reach;with bed alarm set;with nursing/sitter in room Nurse Communication: Mobility status PT Visit Diagnosis: Other abnormalities of gait and mobility (R26.89);Pain Pain - Right/Left: Right Pain - part of body: Hip    Time: 5053-9767 PT Time Calculation (min) (ACUTE ONLY): 19 min   Charges:  PT Evaluation $PT Eval Low Complexity: 1 Low         Lukis Bunt S, PT DPT Acute Rehabilitation Services Pager [phone]  Office [phone]   Louis Matte 03/21/2021, 12:04 PM

## 2021-03-21 NOTE — TOC Initial Note (Addendum)
Transition of Care [facility]) - Initial/Assessment Note    Patient Details  Name: Stacie [name] MRN: [id number] Date of Birth: 05/10/1955  Transition of Care [facility]) CM/SW Contact:    Dessa Phi, RN Phone Number: 03/21/2021, 4:26 PM  Clinical Narrative: Faxed out per dtr [name] permission-await bed offers-dtr aware if no bed offers,then HHC vs otpt PT.                   Expected Discharge Plan: Skilled Nursing Facility Barriers to Discharge: Continued Medical Work up   Patient Goals and CMS Choice Patient states their goals for this hospitalization and ongoing recovery are:: Home/rehab CMS Medicare.gov Compare Post Acute Care list provided to:: Patient Represenative (must comment) (Dtr [name])    Expected Discharge Plan and Services Expected Discharge Plan: [place]                                              Prior Living Arrangements/Services                       Activities of Daily Living Home Assistive Devices/Equipment: None ADL Screening (condition at time of admission) Patient's cognitive ability adequate to safely complete daily activities?: Yes Is the patient deaf or have difficulty hearing?: No Does the patient have difficulty seeing, even when wearing glasses/contacts?: No Does the patient have difficulty concentrating, remembering, or making decisions?: No Patient able to express need for assistance with ADLs?: Yes Does the patient have difficulty dressing or bathing?: No Independently performs ADLs?: Yes (appropriate for developmental age) Does the patient have difficulty walking or climbing stairs?: Yes Weakness of Legs: None Weakness of Arms/Hands: None  Permission Sought/Granted                  Emotional Assessment              Admission diagnosis:  Hip fracture ([place]) [S72.009A] Closed displaced fracture of greater trochanter of left femur, initial encounter ([place]) [S72.112A] Closed right hip  fracture, initial encounter ([place]) [S72.001A] Patient Active Problem List   Diagnosis Date Noted   Bilateral hip fractures, closed, initial encounter ([place]) 03/20/2021   Hypokalemia 03/20/2021   Hip fracture ([place]) 03/19/2021   Alcohol dependence, uncomplicated ([place]) 78/93/8101   Recurrent major depressive disorder, in partial remission ([place]) 03/13/2020   Aortic atherosclerosis ([place]) 06/20/2019   Protein-calorie malnutrition, severe 05/16/2019   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[place]) 05/12/2019   Underweight 05/05/2019   Depression 05/05/2019   Tobacco abuse 05/05/2019   Marijuana use 05/05/2019   Alcohol abuse 05/05/2019   Chronic diastolic CHF (congestive heart failure) ([place]) 05/05/2019   CVA (cerebral vascular accident) ([place]) 05/04/2019   Chronic diarrhea 08/26/2018   Loss of weight 08/26/2018   PAD (peripheral artery disease) ([place]) 12/06/2017   Claudication of right lower extremity ([place]) 12/06/2017   Hyperlipidemia 02/28/2016   Essential hypertension 02/21/2016   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

## 2021-03-21 NOTE — Progress Notes (Signed)
PROGRESS NOTE    Stacie [name]  FHL:[id number] DOB: February 08, 1955 DOA: 03/19/2021 PCP: [name], NP   Brief Narrative:  Stacie [name] is a 66 y.o. female with PMH significant for HTN, HLD, stroke, diastolic CHF, depression, noncompliance to medication Patient presented to the ED on 03/19/2021 with fall at home while under the influence of alcohol leading to right hip pain and inability to ambulate.  Denies hitting head Not on blood thinners. In the ED, patient was hemodynamically stable.  Initial blood pressure was in 130s.   Initial labs with potassium low at 2.9, hemoglobin 10.2, WBC count 11.5 Right x-ray of the hips hip x-ray showed intertrochanteric right hip fracture with foreshortening and varus angulation as well as mildly displaced left greater trochanter fracture. CT head unremarkable Orthopedics was consulted from ED. Admitted to hospitalist service.  Assessment & Plan:  Bilateral hip fractures: -Secondary to fall due to loss of balance while under the influence of alcohol -Orthopedics consulted.   -S/p right femoral intramedullary nail by orthopedic surgery on 03/20/2021. -Pain management  -DVT prophylaxis per orthopedics-recommended Lovenox 40 mg once daily for 30 days -Consult PT/OT-await recommendations -Follow-up with orthopedic surgery 2 weeks with Dr. Mardelle Matte   Hypertensive urgency -Blood pressure is better but is still elevated -Home meds include amlodipine 10 mg daily, Imdur 30 mg daily, hydralazine 100 mg daily.  Unclear compliance -Continue amlodipine 10 mg once daily, hydralazine 50 mg every 8 hours and Imdur 30 mg daily -As needed IV hydralazine -Monitor blood pressure closely.   Acute metabolic encephalopathy -This is likely from a combination of pain medicine, alcohol withdrawal as well as IV Ativan.   -Continue PRN IV Haldol.  -CT head on admission did not show any evidence of intracranial abnormality. -Delirium  precautions   Chronic alcohol use -CIWA protocol with scheduled Librium and as needed Ativan. -Counseled to quit alcohol   Severe hypokalemia -Improved.  Potassium: 3.2.  Replenished.  Magnesium: WNL. -Monitor.  Repeat BMP tomorrow a.m.    Chronic mild anemia Severe iron deficiency Severe vitamin B12 deficiency -Ferritin level low at 10, vitamin B12 level low at 61 -IV iron and IM vitamin B12 replacement ordered -Oral supplement at discharge   Chronic diastolic CHF -Currently compensated.  Last echo from 2021 with EF 60 to 37%, grade 1 diastolic dysfunction -Strict INO's and daily weight.  Monitor signs for fluid overload.   History of stroke/hyperlipidemia Mild cognitive deficit -Currently aspirin on hold.  Continue statin  Severe protein calorie malnutrition -As evidenced by low weight of less than 100 pound, iron deficiency, vitamin B12 deficiency -Probably related to chronic alcohol use as well -Nutrition consult  Leukocytosis: WBC trended down from 14.2-5.9.  Likely reactive.  Patient remained afebrile. -Repeat CBC tomorrow a.m.  Thrombocytosis: Platelet count 404.  Likely reactive.  Repeat CBC tomorrow AM.  Vitamin D deficiency: Start vitamin D supplements q. weekly  DVT prophylaxis: Lovenox Code Status: DNR Family Communication:  None present at bedside.  Plan of care discussed with patient in length and she verbalized understanding and agreed with it. Disposition Plan: To be determined.  Await PT/OT recommendations  Consultants:  Orthopedic surgery  Procedures:  Right hip intramedullary nail insertion  Antimicrobials:  Operative prophylaxis-cefazolin  Status is: Inpatient    Subjective: Patient seen and examined.  Sleepy but arousable.  She denies any pain, fever, chills, chest pain or shortness of breath.  objective: Vitals:   03/20/21 2307 03/21/21 0231 03/21/21 0604 03/21/21 1105  BP: Marland Kitchen)  179/93 (!) 145/81 102/85 (!) 154/79  Pulse: 85 84 94    Resp: 13 14 18    Temp: 99.2 F (37.3 C) 99 F (37.2 C) 98.6 F (37 C)   TempSrc: Axillary Axillary Axillary   SpO2: 100% 99% 99%   Weight:      Height:        Intake/Output Summary (Last 24 hours) at 03/21/2021 1114 Last data filed at 03/21/2021 0400 Gross per 24 hour  Intake 1996.28 ml  Output 10 ml  Net 1986.28 ml   Filed Weights   03/20/21 1529  Weight: 43.1 kg    Examination:  General exam: Appears calm and comfortable, sleepy but arousable, cachectic, appears somewhat confused, has mittens in her hands Respiratory system: Clear to auscultation. Respiratory effort normal. Cardiovascular system: S1 & S2 heard, RRR. No JVD, murmurs, rubs, gallops or clicks. No pedal edema. Gastrointestinal system: Abdomen is nondistended, soft and nontender. No organomegaly or masses felt. Normal bowel sounds heard. Central nervous system: Sleepy but arousable.  Following commands intermittently. Extremities: Right hip: Bandage dry and intact.  No bruises, redness, bleeding or drainage noted. Skin: No rashes, lesions or ulcers    Data Reviewed: I have personally reviewed following labs and imaging studies  CBC: Recent Labs  Lab 03/19/21 2241 03/20/21 0045 03/21/21 0447  WBC 11.5* 14.2* 12.9*  NEUTROABS 9.2*  --  10.3*  HGB 10.2* 10.3* 10.4*  HCT 31.5* 32.2* 32.0*  MCV 83.6 83.4 82.9  PLT 408* 418* 277*   Basic Metabolic Panel: Recent Labs  Lab 03/19/21 2241 03/20/21 0045 03/21/21 0447  NA 134* 133* 131*  K 2.9* 2.8* 3.2*  CL 100 100 98  CO2 23 23 23   GLUCOSE 97 131* 122*  BUN 14 13 16   CREATININE 0.54 0.39* 0.66  CALCIUM 8.2* 8.3* 7.7*  MG 1.9  --  1.8  PHOS 3.0  --   --    GFR: Estimated Creatinine Clearance: 47.7 mL/min (by C-G formula based on SCr of 0.66 mg/dL). Liver Function Tests: Recent Labs  Lab 03/19/21 2241  AST 23  ALT 20  ALKPHOS 59  BILITOT 0.2*  PROT 6.6  ALBUMIN 3.8   No results for input(s): LIPASE, AMYLASE in the last 168  hours. Recent Labs  Lab 03/19/21 2354  AMMONIA 12   Coagulation Profile: Recent Labs  Lab 03/19/21 2241  INR 0.9   Cardiac Enzymes: Recent Labs  Lab 03/19/21 2241  CKTOTAL 137   BNP (last 3 results) No results for input(s): PROBNP in the last 8760 hours. HbA1C: No results for input(s): HGBA1C in the last 72 hours. CBG: No results for input(s): GLUCAP in the last 168 hours. Lipid Profile: No results for input(s): CHOL, HDL, LDLCALC, TRIG, CHOLHDL, LDLDIRECT in the last 72 hours. Thyroid Function Tests: Recent Labs    03/19/21 2241  TSH 7.008*   Anemia Panel: Recent Labs    03/19/21 2241  VITAMINB12 61*  FOLATE 11.4  FERRITIN 10*  TIBC 405  IRON 19*  RETICCTPCT 1.3   Sepsis Labs: No results for input(s): PROCALCITON, LATICACIDVEN in the last 168 hours.  Recent Results (from the past 240 hour(s))  Resp Panel by RT-PCR (Flu A&B, Covid) Nasopharyngeal Swab     Status: None   Collection Time: 03/19/21 10:41 PM   Specimen: Nasopharyngeal Swab; Nasopharyngeal(NP) swabs in vial transport medium  Result Value Ref Range Status   SARS Coronavirus 2 by RT PCR NEGATIVE NEGATIVE Final    Comment: (NOTE) SARS-CoV-2 target nucleic  acids are NOT DETECTED.  The SARS-CoV-2 RNA is generally detectable in upper respiratory specimens during the acute phase of infection. The lowest concentration of SARS-CoV-2 viral copies this assay can detect is 138 copies/mL. A negative result does not preclude SARS-Cov-2 infection and should not be used as the sole basis for treatment or other patient management decisions. A negative result may occur with  improper specimen collection/handling, submission of specimen other than nasopharyngeal swab, presence of viral mutation(s) within the areas targeted by this assay, and inadequate number of viral copies(<138 copies/mL). A negative result must be combined with clinical observations, patient history, and epidemiological information. The  expected result is Negative.  Fact Sheet for Patients:  EntrepreneurPulse.com.au  Fact Sheet for Healthcare Providers:  IncredibleEmployment.be  This test is no t yet approved or cleared by the [place] FDA and  has been authorized for detection and/or diagnosis of SARS-CoV-2 by FDA under an Emergency Use Authorization (EUA). This EUA will remain  in effect (meaning this test can be used) for the duration of the COVID-19 declaration under Section 564(b)(1) of the Act, 21 U.S.C.section 360bbb-3(b)(1), unless the authorization is terminated  or revoked sooner.       Influenza A by PCR NEGATIVE NEGATIVE Final   Influenza B by PCR NEGATIVE NEGATIVE Final    Comment: (NOTE) The Xpert Xpress SARS-CoV-2/FLU/RSV plus assay is intended as an aid in the diagnosis of influenza from Nasopharyngeal swab specimens and should not be used as a sole basis for treatment. Nasal washings and aspirates are unacceptable for Xpert Xpress SARS-CoV-2/FLU/RSV testing.  Fact Sheet for Patients: EntrepreneurPulse.com.au  Fact Sheet for Healthcare Providers: IncredibleEmployment.be  This test is not yet approved or cleared by the [place] FDA and has been authorized for detection and/or diagnosis of SARS-CoV-2 by FDA under an Emergency Use Authorization (EUA). This EUA will remain in effect (meaning this test can be used) for the duration of the COVID-19 declaration under Section 564(b)(1) of the Act, 21 U.S.C. section 360bbb-3(b)(1), unless the authorization is terminated or revoked.  Performed at [facility], [place] [address], [place], [place] [address]       Radiology Studies: DG Chest 1 View  Result Date: 03/19/2021 CLINICAL DATA:  Fall and right femur fracture. EXAM: CHEST  1 VIEW COMPARISON:  Chest radiograph dated 09/17/2015. FINDINGS: No focal consolidation, pleural effusion, pneumothorax.  The cardiac silhouette is within normal limits. No acute osseous pathology. IMPRESSION: No active disease. Electronically Signed   By: Anner Crete M.D.   On: 03/19/2021 22:45   CT HEAD WO CONTRAST (5MM)  Result Date: 03/20/2021 CLINICAL DATA:  Head trauma, minor (Age >= 65y) fall, bilateral hip fractures, EtOH on board EXAM: CT HEAD WITHOUT CONTRAST TECHNIQUE: Contiguous axial images were obtained from the base of the skull through the vertex without intravenous contrast. RADIATION DOSE REDUCTION: This exam was performed according to the departmental dose-optimization program which includes automated exposure control, adjustment of the mA and/or kV according to patient size and/or use of iterative reconstruction technique. COMPARISON:  CT head 05/04/2019 BRAIN: BRAIN Patchy and confluent areas of decreased attenuation are noted throughout the deep and periventricular white matter of the cerebral hemispheres bilaterally, compatible with chronic microvascular ischemic disease. Chronic right corona radiata/basal ganglia infarction. No evidence of large-territorial acute infarction. No parenchymal hemorrhage. No mass lesion. No extra-axial collection. No mass effect or midline shift. No hydrocephalus. Basilar cisterns are patent. Vascular: No hyperdense vessel. Skull: No acute fracture or focal lesion. Sinuses/Orbits:  Paranasal sinuses and mastoid air cells are clear. The orbits are unremarkable. Other: None. IMPRESSION: No acute intracranial abnormality. Electronically Signed   By: Iven Finn M.D.   On: 03/20/2021 00:15   DG C-Arm 1-60 Min-No Report  Result Date: 03/20/2021 Fluoroscopy was utilized by the requesting physician.  No radiographic interpretation.   DG C-Arm 1-60 Min-No Report  Result Date: 03/20/2021 Fluoroscopy was utilized by the requesting physician.  No radiographic interpretation.   DG HIP OPERATIVE UNILAT W OR W/O PELVIS RIGHT  Result Date: 03/20/2021 CLINICAL DATA:  RIGHT  hip repair EXAM: OPERATIVE RIGHT HIP (WITH PELVIS IF PERFORMED) 7 VIEWS TECHNIQUE: Fluoroscopic spot image(s) were submitted for interpretation post-operatively. 03/19/2021 COMPARISON:  Fluoroscopy time: 1 minute 25 seconds Dose: 4.5499 mGy FINDINGS: Intertrochanteric fracture RIGHT femur, nondisplaced. Osseous demineralization. Images demonstrate placement of an IM nail with a locking screw and a compression screw at the proximal RIGHT femur. No additional fracture or dislocation. IMPRESSION: Post ORIF intertrochanteric fracture RIGHT femur. Electronically Signed   By: [name] Dana M.D.   On: 03/20/2021 17:30   DG HIP UNILAT WITH PELVIS 2-3 VIEWS RIGHT  Result Date: 03/20/2021 CLINICAL DATA:  S/P RIGHT IM nail - images obtained bedside in PACU EXAM: DG HIP (WITH OR WITHOUT PELVIS) 2-3V RIGHT COMPARISON:  None. FINDINGS: Interval intramedullary nail fixation of a right intertrochanteric fracture. There is no evidence of new acute hip fracture or dislocation. There is no evidence of severe arthropathy or other focal bone abnormality. IMPRESSION: Interval intramedullary nail fixation of a right intertrochanteric fracture. Electronically Signed   By: Iven Finn M.D.   On: 03/20/2021 18:46   DG Hip Unilat W or Wo Pelvis 2-3 Views Right  Result Date: 03/19/2021 CLINICAL DATA:  Fall, right thigh pain EXAM: DG HIP (WITH OR WITHOUT PELVIS) 2-3V RIGHT COMPARISON:  None. FINDINGS: Right intertrochanteric hip fracture with foreshortening and varus angulation. Mildly displaced left greater trochanter fracture. Bilateral hip joint spaces are preserved. Visualized bony pelvis appears intact. IMPRESSION: Right intertrochanteric hip fracture, as above. Mildly displaced left greater trochanter fracture. Electronically Signed   By: Julian Hy M.D.   On: 03/19/2021 22:45   DG Femur Min 2 Views Right  Result Date: 03/19/2021 CLINICAL DATA:  Fall, right thigh pain EXAM: RIGHT FEMUR 2 VIEWS COMPARISON:  None.  FINDINGS: Intertrochanteric right hip fracture with foreshortening and varus angulation. No fracture or dislocation at the knee. IMPRESSION: Negative. Electronically Signed   By: Julian Hy M.D.   On: 03/19/2021 22:45    Scheduled Meds:  acetaminophen  500 mg Oral Q6H   amLODipine  10 mg Oral Daily   chlordiazePOXIDE  10 mg Oral TID   docusate sodium  100 mg Oral BID   enoxaparin (LOVENOX) injection  40 mg Subcutaneous J62G   folic acid  1 mg Oral Daily   hydrALAZINE  50 mg Oral Q8H   isosorbide mononitrate  30 mg Oral Daily   mouth rinse  15 mL Mouth Rinse BID   multivitamin with minerals  1 tablet Oral Daily   potassium chloride  40 mEq Oral BID   senna  1 tablet Oral BID   simvastatin  20 mg Oral q1800   thiamine  100 mg Oral Daily   Or   thiamine  100 mg Intravenous Daily   vitamin B-12  1,000 mcg Oral Daily   Continuous Infusions:  0.9 % NaCl with KCl 20 mEq / L 75 mL/hr at 03/21/21 0959   ferric gluconate (FERRLECIT)  IVPB 250 mg (03/21/21 1002)   methocarbamol (ROBAXIN) IV       LOS: 2 days   Time spent: 35 minutes   Kain Milosevic Loann Quill, MD [facility]  If 7PM-7AM, please contact night-coverage [email or web address] 03/21/2021, 11:14 AM

## 2021-03-21 NOTE — NC FL2 (Signed)
[place] LEVEL OF CARE SCREENING TOOL     IDENTIFICATION  Patient Name: Stacie [name] Birthdate: Nov 06, 1955 Sex: female Admission Date (Current Location): 03/19/2021  [facility] and [place] Number:  Herbalist and Address:  [facility],  [place] [address] [place]      Provider Number: [id number]  Attending Physician Name and Address:  Mckinley Jewel, MD  Relative Name and Phone Number:  Megan dtr [phone]    Current Level of Care: Hospital Recommended Level of Care: [place] Prior Approval Number:    Date Approved/Denied:   PASRR Number: [id number]  Discharge Plan: SNF    Current Diagnoses: Patient Active Problem List   Diagnosis Date Noted   Bilateral hip fractures, closed, initial encounter ([place]) 03/20/2021   Hypokalemia 03/20/2021   Hip fracture ([place]) 03/19/2021   Alcohol dependence, uncomplicated ([place]) 54/65/0354   Recurrent major depressive disorder, in partial remission ([place]) 03/13/2020   Aortic atherosclerosis ([place]) 06/20/2019   Protein-calorie malnutrition, severe 05/16/2019   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[place]) 05/12/2019   Underweight 05/05/2019   Depression 05/05/2019   Tobacco abuse 05/05/2019   Marijuana use 05/05/2019   Alcohol abuse 05/05/2019   Chronic diastolic CHF (congestive heart failure) ([place]) 05/05/2019   CVA (cerebral vascular accident) ([place]) 05/04/2019   Chronic diarrhea 08/26/2018   Loss of weight 08/26/2018   PAD (peripheral artery disease) ([place]) 12/06/2017   Claudication of right lower extremity ([place]) 12/06/2017   Hyperlipidemia 02/28/2016   Essential hypertension 02/21/2016   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 (CVA) 02/21/2016   Hearing loss 02/21/2016    Orientation RESPIRATION BLADDER Height & Weight     Self, Time, Situation, Place  Normal Continent Weight: 43.1 kg Height:  5' 4.25" (163.2 cm)  BEHAVIORAL SYMPTOMS/MOOD NEUROLOGICAL  BOWEL NUTRITION STATUS      Continent Diet (Regular)  AMBULATORY STATUS COMMUNICATION OF NEEDS Skin   Limited Assist Verbally Surgical wounds (hip surgical site)                       Personal Care Assistance Level of Assistance  Bathing, Feeding, Dressing Bathing Assistance: Limited assistance Feeding assistance: Limited assistance Dressing Assistance: Limited assistance     Functional Limitations Info  Sight, Hearing, Speech Sight Info: Impaired (eyeglasses) Hearing Info: Adequate Speech Info: Adequate    SPECIAL CARE FACTORS FREQUENCY  PT (By licensed PT), OT (By licensed OT)     PT Frequency:  (5x week) OT Frequency:  (5x week)            Contractures Contractures Info: Not present    Additional Factors Info  Code Status, Allergies Code Status Info:  (DNR) Allergies Info:  (Trazadone;welbutrin)           Current Medications (03/21/2021):  This is the current hospital active medication list Current Facility-Administered Medications  Medication Dose Route Frequency Provider Last Rate Last Admin   0.9 % NaCl with KCl 20 mEq/ L  infusion   Intravenous Continuous Ventura Bruns, PA-C 75 mL/hr at 03/21/21 0959 New Bag at 03/21/21 0959   acetaminophen (TYLENOL) tablet 325-650 mg  325-650 mg Oral Q6H PRN Merlene Pulling K, PA-C       acetaminophen (TYLENOL) tablet 500 mg  500 mg Oral Q6H Brown, Blaine K, PA-C   500 mg at 03/21/21 1237   alum & mag hydroxide-simeth (MAALOX/MYLANTA) 200-200-20 MG/5ML suspension 30 mL  30 mL Oral Q4H PRN Merlene Pulling  K, PA-C       amLODipine (NORVASC) tablet 10 mg  10 mg Oral Daily Merlene Pulling K, PA-C   10 mg at 03/21/21 1105   bisacodyl (DULCOLAX) suppository 10 mg  10 mg Rectal Daily PRN Merlene Pulling K, PA-C       chlordiazePOXIDE (LIBRIUM) capsule 10 mg  10 mg Oral TID Merlene Pulling K, PA-C   10 mg at 03/21/21 1243   docusate sodium (COLACE) capsule 100 mg  100 mg Oral BID Merlene Pulling K, PA-C   100 mg at 03/21/21 1238    enoxaparin (LOVENOX) injection 40 mg  40 mg Subcutaneous Q24H Brown, Blaine K, PA-C   40 mg at 03/21/21 1017   feeding supplement (ENSURE ENLIVE / ENSURE PLUS) liquid 237 mL  237 mL Oral TID BM Pahwani, Rinka R, MD   237 mL at 03/21/21 1548   ferric gluconate (FERRLECIT) 250 mg in sodium chloride 0.9 % 250 mL IVPB  250 mg Intravenous Daily Ventura Bruns, PA-C 135 mL/hr at 03/21/21 1002 250 mg at 51/02/58 5277   folic acid (FOLVITE) tablet 1 mg  1 mg Oral Daily Merlene Pulling K, PA-C   1 mg at 03/21/21 1238   haloperidol lactate (HALDOL) injection 5 mg  5 mg Intravenous Q6H PRN Merlene Pulling K, PA-C   5 mg at 03/20/21 1053   hydrALAZINE (APRESOLINE) injection 10 mg  10 mg Intravenous Q6H PRN Merlene Pulling K, PA-C   10 mg at 03/20/21 1035   hydrALAZINE (APRESOLINE) tablet 50 mg  50 mg Oral Q8H Brown, Blaine K, PA-C   50 mg at 03/21/21 1548   HYDROcodone-acetaminophen ([place]) 7.5-325 MG per tablet 1-2 tablet  1-2 tablet Oral Q4H PRN Merlene Pulling K, PA-C       HYDROcodone-acetaminophen (NORCO/VICODIN) 5-325 MG per tablet 1-2 tablet  1-2 tablet Oral Q4H PRN Merlene Pulling K, PA-C   1 tablet at 03/21/21 0616   isosorbide mononitrate (IMDUR) 24 hr tablet 30 mg  30 mg Oral Daily Owens Shark, Blaine K, PA-C   30 mg at 03/21/21 1105   LORazepam (ATIVAN) tablet 1-4 mg  1-4 mg Oral Q1H PRN Merlene Pulling K, PA-C       Or   LORazepam (ATIVAN) injection 1-4 mg  1-4 mg Intravenous Q1H PRN Merlene Pulling K, PA-C   2 mg at 03/20/21 1331   MEDLINE mouth rinse  15 mL Mouth Rinse BID Doutova, Anastassia, MD       menthol-cetylpyridinium (CEPACOL) lozenge 3 mg  1 lozenge Oral PRN Merlene Pulling K, PA-C       Or   phenol (CHLORASEPTIC) mouth spray 1 spray  1 spray Mouth/Throat PRN Merlene Pulling K, PA-C       methocarbamol (ROBAXIN) tablet 500 mg  500 mg Oral Q6H PRN Merlene Pulling K, PA-C       Or   methocarbamol (ROBAXIN) 500 mg in dextrose 5 % 50 mL IVPB  500 mg Intravenous Q6H PRN Owens Shark, Blaine K, PA-C       morphine (PF) 2  MG/ML injection 0.5-1 mg  0.5-1 mg Intravenous Q2H PRN Owens Shark, Blaine K, PA-C       multivitamin with minerals tablet 1 tablet  1 tablet Oral Daily Merlene Pulling K, PA-C   1 tablet at 03/21/21 1238   ondansetron (ZOFRAN) tablet 4 mg  4 mg Oral Q6H PRN Merlene Pulling K, PA-C       Or   ondansetron [facility]) injection 4 mg  4 mg  Intravenous Q6H PRN Merlene Pulling K, PA-C       polyethylene glycol (MIRALAX / GLYCOLAX) packet 17 g  17 g Oral Daily PRN Owens Shark, Blaine K, PA-C       senna (SENOKOT) tablet 8.6 mg  1 tablet Oral BID Ventura Bruns, PA-C   8.6 mg at 03/21/21 1236   simvastatin (ZOCOR) tablet 20 mg  20 mg Oral q1800 Merlene Pulling K, PA-C       thiamine tablet 100 mg  100 mg Oral Daily Brown, Blaine K, PA-C       Or   thiamine (B-1) injection 100 mg  100 mg Intravenous Daily Merlene Pulling K, PA-C   100 mg at 03/21/21 1107   vitamin B-12 (CYANOCOBALAMIN) tablet 1,000 mcg  1,000 mcg Oral Daily Merlene Pulling K, PA-C   1,000 mcg at 03/21/21 1236   Vitamin D (Ergocalciferol) (DRISDOL) capsule 50,000 Units  50,000 Units Oral Q7 days Pahwani, Michell Heinrich, MD         Discharge Medications: Please see discharge summary for a list of discharge medications.  Relevant Imaging Results:  Relevant Lab Results:   Additional Information  (SS#[id number])  Isaid Salvia, Juliann Pulse, RN

## 2021-03-22 MED ORDER — LIP MEDEX EX OINT
TOPICAL_OINTMENT | CUTANEOUS | Status: DC | PRN
  Administered 2021-03-22: 75 via TOPICAL
  Filled 2021-03-22: qty 7

## 2021-03-22 MED ORDER — HYDROCODONE-ACETAMINOPHEN 5-325 MG PO TABS: 1.0000 | ORAL_TABLET | Freq: Four times a day (QID) | ORAL | 0 refills | Status: DC | PRN

## 2021-03-22 MED ORDER — ENOXAPARIN SODIUM 40 MG/0.4ML IJ SOSY: 40.0000 mg | PREFILLED_SYRINGE | INTRAMUSCULAR | 0 refills | Status: DC

## 2021-03-22 MED ORDER — PERMETHRIN 5 % EX CREA
TOPICAL_CREAM | Freq: Once | CUTANEOUS | Status: AC
  Filled 2021-03-22: qty 60

## 2021-03-22 NOTE — Progress Notes (Signed)
° ° ° °  Subjective: 2 Days Post-Op s/p Procedure(s): INTRAMEDULLARY (IM) NAIL FEMORAL  Much more awake and cooperative today. She states she is not in pain at either hip. Laying on left side when I enter the room. Denies CP, SOB, calf pain. Denies nausea, vomiting, or abdominal pain. Voiding well.   Objective:  PE: VITALS:   Vitals:   03/21/21 1105 03/21/21 1528 03/21/21 1948 03/22/21 0500  BP: (!) 154/79 123/70 120/69 (!) 159/74  Pulse:  88 86 81  Resp:  17 18 (!) 21  Temp:   98 F (36.7 C) 98.4 F (36.9 C)  TempSrc:   Axillary   SpO2:  98% 96% 95%  Weight:      Height:       General: laying in bed Sensation intact distally Intact pulses distally Dorsiflexion/Plantar flexion intact Incision: dressing C/D/I No dressing on proximal wound when I examines, replaced with new mepilex.   LABS  No results found for this or any previous visit (from the past 24 hour(s)).   DG C-Arm 1-60 Min-No Report  Result Date: 03/20/2021 Fluoroscopy was utilized by the requesting physician.  No radiographic interpretation.   DG C-Arm 1-60 Min-No Report  Result Date: 03/20/2021 Fluoroscopy was utilized by the requesting physician.  No radiographic interpretation.   DG HIP OPERATIVE UNILAT W OR W/O PELVIS RIGHT  Result Date: 03/20/2021 CLINICAL DATA:  RIGHT hip repair EXAM: OPERATIVE RIGHT HIP (WITH PELVIS IF PERFORMED) 7 VIEWS TECHNIQUE: Fluoroscopic spot image(s) were submitted for interpretation post-operatively. 03/19/2021 COMPARISON:  Fluoroscopy time: 1 minute 25 seconds Dose: 4.5499 mGy FINDINGS: Intertrochanteric fracture RIGHT femur, nondisplaced. Osseous demineralization. Images demonstrate placement of an IM nail with a locking screw and a compression screw at the proximal RIGHT femur. No additional fracture or dislocation. IMPRESSION: Post ORIF intertrochanteric fracture RIGHT femur. Electronically Signed   By: [name] Dana M.D.   On: 03/20/2021 17:30   DG HIP UNILAT WITH PELVIS 2-3  VIEWS RIGHT  Result Date: 03/20/2021 CLINICAL DATA:  S/P RIGHT IM nail - images obtained bedside in PACU EXAM: DG HIP (WITH OR WITHOUT PELVIS) 2-3V RIGHT COMPARISON:  None. FINDINGS: Interval intramedullary nail fixation of a right intertrochanteric fracture. There is no evidence of new acute hip fracture or dislocation. There is no evidence of severe arthropathy or other focal bone abnormality. IMPRESSION: Interval intramedullary nail fixation of a right intertrochanteric fracture. Electronically Signed   By: Iven Finn M.D.   On: 03/20/2021 18:46    Assessment/Plan: Right intertrochanteric hip fracture Left femur greater trochanter fracture  2 Days Post-Op s/p Procedure(s): INTRAMEDULLARY (IM) NAIL FEMORAL  Weightbearing: WBAT BLE Insicional and dressing care: reinforce dressings as needed, put on new proximal wound dressing today. VTE prophylaxis: Lovenox 40mg  qd x 30 days Pain control: continue current regimen Follow - up plan: 2 weeks with Dr. Mardelle Matte Dispo: PT recommending SNF, TOC working on placement  Contact information:   Weekdays 8-[address], [place] [phone] A fter hours and holidays please check [email or web address] for group call information for [facility]  Ventura Bruns 03/22/2021, 9:10 AM

## 2021-03-22 NOTE — Plan of Care (Signed)
°  Problem: Clinical Measurements: °Goal: Ability to maintain clinical measurements within normal limits will improve °Outcome: Progressing °Goal: Will remain free from infection °Outcome: Progressing °Goal: Diagnostic test results will improve °Outcome: Progressing °  °Problem: Pain Managment: °Goal: General experience of comfort will improve °Outcome: Progressing °  °

## 2021-03-22 NOTE — Plan of Care (Signed)
Pt has had no acute events overnight. CIWA of 0, continues to be somnolent but awakens to voice. Pt with decreased oral intake, offering fluids to pt as tolerated. VSS. Denies any further needs at this time.

## 2021-03-22 NOTE — Progress Notes (Signed)
PROGRESS NOTE    Stacie [name]  JGG:[id number] DOB: 05-06-1955 DOA: 03/19/2021 PCP: Stacie Buffy, NP   Brief Narrative:  Stacie [name] is a 66 y.o. female with PMH significant for HTN, HLD, stroke, diastolic CHF, depression, noncompliance to medication Patient presented to the ED on 03/19/2021 with fall at home while under the influence of alcohol leading to right hip pain and inability to ambulate.  Denies hitting [name] Not on blood thinners. In the ED, patient was hemodynamically stable.  Initial blood pressure was in 130s.   Initial labs with potassium low at 2.9, hemoglobin 10.2, WBC count 11.5 Right x-ray of the hips hip x-ray showed intertrochanteric right hip fracture with foreshortening and varus angulation as well as mildly displaced left greater trochanter fracture. CT [name] unremarkable Orthopedics was consulted from ED. Admitted to hospitalist service.  Assessment & Plan:  Bilateral hip fractures: (Right intertrochanteric hip fracture, left femur greater trochanter fracture) -Secondary to fall due to loss of balance while under the influence of alcohol -Orthopedics consulted.   -S/p right femoral intramedullary nail by orthopedic surgery on 03/20/2021. -Continue as needed pain management  -DVT prophylaxis per orthopedics-recommended Lovenox 40 mg once daily for 30 days -Consult PT/OT-recommended SNF -Follow-up with orthopedic surgery 2 weeks with Dr. Mardelle Matte   Hypertensive urgency -Blood pressure is improving.  Slightly elevated likely due to pain. -Continue amlodipine 10 mg once daily, hydralazine 50 mg every 8 hours and Imdur 30 mg daily -As needed IV hydralazine -Monitor blood pressure closely.   Acute metabolic encephalopathy -This is likely from a combination of pain medicine, alcohol withdrawal as well as IV Ativan.   -Continue PRN IV Haldol.  -CT [name] on admission did not show any evidence of intracranial abnormality. -Delirium  precautions   Chronic alcohol use -CIWA protocol with scheduled Librium and as needed Ativan. -Counseled to quit alcohol   Severe hypokalemia -Improved.  Potassium: 3.2.  Replenished.  Magnesium: WNL. -Monitor.  Repeat BMP-pending  Chronic mild anemia Severe iron deficiency Severe vitamin B12 deficiency -Ferritin level low at 10, vitamin B12 level low at 61 -IV iron and IM vitamin B12 replacement ordered -Oral supplement at discharge   Chronic diastolic CHF -Currently compensated.  Last echo from 2021 with EF 60 to 54%, grade 1 diastolic dysfunction -Strict INO's and daily weight.  Monitor signs for fluid overload.  Scabies: -noted prior to the admission. She has generalized pruritus with scratches all over her body.  Permethrin cream ordered.  On isolation precautions   History of stroke/hyperlipidemia Mild cognitive deficit -Currently aspirin on hold.  Continue statin  Severe protein calorie malnutrition -As evidenced by low weight of less than 100 pound, iron deficiency, vitamin B12 deficiency -Probably related to chronic alcohol use as well -Nutrition consult-appreciate help  Leukocytosis: WBC trended down from 14.2-5.9.  Likely reactive.  Patient remained afebrile. -CBC pending  Thrombocytosis: Platelet count 404.  Likely reactive.    Vitamin D deficiency: Start vitamin D supplements q. weekly  DVT prophylaxis: Lovenox Code Status: DNR Family Communication:  None present at bedside.  Plan of care discussed with patient in length and she verbalized understanding and agreed with it. Disposition Plan: SNF-TOC consulted-await bed availability  Consultants:  Orthopedic surgery  Procedures:  Right hip intramedullary nail insertion  Antimicrobials:  Operative prophylaxis-cefazolin   Status is: Inpatient    Subjective: Patient seen and examined.  Complaining of severe right hip pain. No acute events overnight.  Patient denies anything however as per RN patient  was scratching all night.  No fever  objective: Vitals:   03/21/21 1528 03/21/21 1948 03/22/21 0500 03/22/21 0910  BP: 123/70 120/69 (!) 159/74 (!) 144/64  Pulse: 88 86 81   Resp: 17 18 (!) 21   Temp:  98 F (36.7 C) 98.4 F (36.9 C)   TempSrc:  Axillary    SpO2: 98% 96% 95%   Weight:      Height:        Intake/Output Summary (Last 24 hours) at 03/22/2021 1050 Last data filed at 03/22/2021 0500 Gross per 24 hour  Intake 1907.94 ml  Output --  Net 1907.94 ml    Filed Weights   03/20/21 1529  Weight: 43.1 kg    Examination:  General exam: Appears calm and comfortable, on room air, appears older than her age, cachectic, in pain  respiratory system: Clear to auscultation. Respiratory effort normal. Cardiovascular system: S1 & S2 heard, RRR. No JVD, murmurs, rubs, gallops or clicks. No pedal edema. Gastrointestinal system: Abdomen is nondistended, soft and nontender. No organomegaly or masses felt. Normal bowel sounds heard. Central nervous system: Alert, in pain, following commands  extremities: Right hip: Bandage dry and intact.  No bruises, redness, bleeding or drainage noted. Skin: No rashes, lesions or ulcers    Data Reviewed: I have personally reviewed following labs and imaging studies  CBC: Recent Labs  Lab 03/19/21 2241 03/20/21 0045 03/21/21 0447  WBC 11.5* 14.2* 12.9*  NEUTROABS 9.2*  --  10.3*  HGB 10.2* 10.3* 10.4*  HCT 31.5* 32.2* 32.0*  MCV 83.6 83.4 82.9  PLT 408* 418* 404*    Basic Metabolic Panel: Recent Labs  Lab 03/19/21 2241 03/20/21 0045 03/21/21 0447  NA 134* 133* 131*  K 2.9* 2.8* 3.2*  CL 100 100 98  CO2 23 23 23   GLUCOSE 97 131* 122*  BUN 14 13 16   CREATININE 0.54 0.39* 0.66  CALCIUM 8.2* 8.3* 7.7*  MG 1.9  --  1.8  PHOS 3.0  --   --     GFR: Estimated Creatinine Clearance: 47.7 mL/min (by C-G formula based on SCr of 0.66 mg/dL). Liver Function Tests: Recent Labs  Lab 03/19/21 2241  AST 23  ALT 20  ALKPHOS 59   BILITOT 0.2*  PROT 6.6  ALBUMIN 3.8    No results for input(s): LIPASE, AMYLASE in the last 168 hours. Recent Labs  Lab 03/19/21 2354  AMMONIA 12    Coagulation Profile: Recent Labs  Lab 03/19/21 2241  INR 0.9    Cardiac Enzymes: Recent Labs  Lab 03/19/21 2241  CKTOTAL 137    BNP (last 3 results) No results for input(s): PROBNP in the last 8760 hours. HbA1C: No results for input(s): HGBA1C in the last 72 hours. CBG: No results for input(s): GLUCAP in the last 168 hours. Lipid Profile: No results for input(s): CHOL, HDL, LDLCALC, TRIG, CHOLHDL, LDLDIRECT in the last 72 hours. Thyroid Function Tests: Recent Labs    03/19/21 2241  TSH 7.008*    Anemia Panel: Recent Labs    03/19/21 2241  VITAMINB12 61*  FOLATE 11.4  FERRITIN 10*  TIBC 405  IRON 19*  RETICCTPCT 1.3    Sepsis Labs: No results for input(s): PROCALCITON, LATICACIDVEN in the last 168 hours.  Recent Results (from the past 240 hour(s))  Resp Panel by RT-PCR (Flu A&B, Covid) Nasopharyngeal Swab     Status: None   Collection Time: 03/19/21 10:41 PM   Specimen: Nasopharyngeal Swab; Nasopharyngeal(NP) swabs in vial transport  medium  Result Value Ref Range Status   SARS Coronavirus 2 by RT PCR NEGATIVE NEGATIVE Final    Comment: (NOTE) SARS-CoV-2 target nucleic acids are NOT DETECTED.  The SARS-CoV-2 RNA is generally detectable in upper respiratory specimens during the acute phase of infection. The lowest concentration of SARS-CoV-2 viral copies this assay can detect is 138 copies/mL. A negative result does not preclude SARS-Cov-2 infection and should not be used as the sole basis for treatment or other patient management decisions. A negative result may occur with  improper specimen collection/handling, submission of specimen other than nasopharyngeal swab, presence of viral mutation(s) within the areas targeted by this assay, and inadequate number of viral copies(<138 copies/mL). A  negative result must be combined with clinical observations, patient history, and epidemiological information. The expected result is Negative.  Fact Sheet for Patients:  EntrepreneurPulse.com.au  Fact Sheet for Healthcare Providers:  IncredibleEmployment.be  This test is no t yet approved or cleared by the [place] FDA and  has been authorized for detection and/or diagnosis of SARS-CoV-2 by FDA under an Emergency Use Authorization (EUA). This EUA will remain  in effect (meaning this test can be used) for the duration of the COVID-19 declaration under Section 564(b)(1) of the Act, 21 U.S.C.section 360bbb-3(b)(1), unless the authorization is terminated  or revoked sooner.       Influenza A by PCR NEGATIVE NEGATIVE Final   Influenza B by PCR NEGATIVE NEGATIVE Final    Comment: (NOTE) The Xpert Xpress SARS-CoV-2/FLU/RSV plus assay is intended as an aid in the diagnosis of influenza from Nasopharyngeal swab specimens and should not be used as a sole basis for treatment. Nasal washings and aspirates are unacceptable for Xpert Xpress SARS-CoV-2/FLU/RSV testing.  Fact Sheet for Patients: EntrepreneurPulse.com.au  Fact Sheet for Healthcare Providers: IncredibleEmployment.be  This test is not yet approved or cleared by the [place] FDA and has been authorized for detection and/or diagnosis of SARS-CoV-2 by FDA under an Emergency Use Authorization (EUA). This EUA will remain in effect (meaning this test can be used) for the duration of the COVID-19 declaration under Section 564(b)(1) of the Act, 21 U.S.C. section 360bbb-3(b)(1), unless the authorization is terminated or revoked.  Performed at [facility], [place] [address] [place], [place] [address]        Radiology Studies: DG C-Arm 1-60 Min-No Report  Result Date: 03/20/2021 Fluoroscopy was utilized by the requesting  physician.  No radiographic interpretation.   DG C-Arm 1-60 Min-No Report  Result Date: 03/20/2021 Fluoroscopy was utilized by the requesting physician.  No radiographic interpretation.   DG HIP OPERATIVE UNILAT W OR W/O PELVIS RIGHT  Result Date: 03/20/2021 CLINICAL DATA:  RIGHT hip repair EXAM: OPERATIVE RIGHT HIP (WITH PELVIS IF PERFORMED) 7 VIEWS TECHNIQUE: Fluoroscopic spot image(s) were submitted for interpretation post-operatively. 03/19/2021 COMPARISON:  Fluoroscopy time: 1 minute 25 seconds Dose: 4.5499 mGy FINDINGS: Intertrochanteric fracture RIGHT femur, nondisplaced. Osseous demineralization. Images demonstrate placement of an IM nail with a locking screw and a compression screw at the proximal RIGHT femur. No additional fracture or dislocation. IMPRESSION: Post ORIF intertrochanteric fracture RIGHT femur. Electronically Signed   By: [name] Dana M.D.   On: 03/20/2021 17:30   DG HIP UNILAT WITH PELVIS 2-3 VIEWS RIGHT  Result Date: 03/20/2021 CLINICAL DATA:  S/P RIGHT IM nail - images obtained bedside in PACU EXAM: DG HIP (WITH OR WITHOUT PELVIS) 2-3V RIGHT COMPARISON:  None. FINDINGS: Interval intramedullary nail fixation of a right intertrochanteric fracture. There is  no evidence of new acute hip fracture or dislocation. There is no evidence of severe arthropathy or other focal bone abnormality. IMPRESSION: Interval intramedullary nail fixation of a right intertrochanteric fracture. Electronically Signed   By: Iven Finn M.D.   On: 03/20/2021 18:46    Scheduled Meds:  amLODipine  10 mg Oral Daily   chlordiazePOXIDE  10 mg Oral TID   docusate sodium  100 mg Oral BID   enoxaparin (LOVENOX) injection  40 mg Subcutaneous Q24H   feeding supplement  237 mL Oral TID BM   folic acid  1 mg Oral Daily   hydrALAZINE  50 mg Oral Q8H   isosorbide mononitrate  30 mg Oral Daily   mouth rinse  15 mL Mouth Rinse BID   multivitamin with minerals  1 tablet Oral Daily   senna  1 tablet Oral BID    simvastatin  20 mg Oral q1800   thiamine  100 mg Oral Daily   Or   thiamine  100 mg Intravenous Daily   vitamin B-12  1,000 mcg Oral Daily   Vitamin D (Ergocalciferol)  50,000 Units Oral Q7 days   Continuous Infusions:  ferric gluconate (FERRLECIT) IVPB 250 mg (03/21/21 1002)   methocarbamol (ROBAXIN) IV       LOS: 3 days   Time spent: 35 minutes   Marlene Pfluger Loann Quill, MD [facility]  If 7PM-7AM, please contact night-coverage [email or web address] 03/22/2021, 10:50 AM

## 2021-03-23 ENCOUNTER — Inpatient Hospital Stay (HOSPITAL_COMMUNITY): Payer: Medicare Other

## 2021-03-23 LAB — BASIC METABOLIC PANEL
Anion gap: 9 (ref 5–15)
BUN: 11 mg/dL (ref 8–23)
CO2: 25 mmol/L (ref 22–32)
Calcium: 8.5 mg/dL — ABNORMAL LOW (ref 8.9–10.3)
Chloride: 104 mmol/L (ref 98–111)
Creatinine, Ser: 0.42 mg/dL — ABNORMAL LOW (ref 0.44–1.00)
GFR, Estimated: >60 mL/min (ref >60)
Glucose, Bld: 109 mg/dL — ABNORMAL HIGH (ref 70–99)
Potassium: 3.5 mmol/L (ref 3.5–5.1)
Sodium: 138 mmol/L (ref 135–145)

## 2021-03-23 LAB — CBC WITH DIFFERENTIAL/PLATELET
Abs Immature Granulocytes: 0.11 10*3/uL — ABNORMAL HIGH (ref 0.00–0.07)
Basophils Absolute: 0.1 10*3/uL (ref 0.0–0.1)
Basophils Relative: 1 %
Eosinophils Absolute: 0.1 10*3/uL (ref 0.0–0.5)
Eosinophils Relative: 2 %
HCT: 26.2 % — ABNORMAL LOW (ref 36.0–46.0)
Hemoglobin: 8.2 g/dL — ABNORMAL LOW (ref 12.0–15.0)
Immature Granulocytes: 1 %
Lymphocytes Relative: 11 %
Lymphs Abs: 1 10*3/uL (ref 0.7–4.0)
MCH: 26.7 pg (ref 26.0–34.0)
MCHC: 31.3 g/dL (ref 30.0–36.0)
MCV: 85.3 fL (ref 80.0–100.0)
Monocytes Absolute: 0.8 10*3/uL (ref 0.1–1.0)
Monocytes Relative: 9 %
Neutro Abs: 6.7 10*3/uL (ref 1.7–7.7)
Neutrophils Relative %: 76 %
Platelets: 291 10*3/uL (ref 150–400)
RBC: 3.07 MIL/uL — ABNORMAL LOW (ref 3.87–5.11)
RDW: 13.6 % (ref 11.5–15.5)
WBC: 8.8 10*3/uL (ref 4.0–10.5)
nRBC: 0 % (ref 0.0–0.2)

## 2021-03-23 LAB — URINALYSIS, ROUTINE W REFLEX MICROSCOPIC
Bacteria, UA: NONE SEEN
Bilirubin Urine: NEGATIVE
Glucose, UA: NEGATIVE mg/dL
Ketones, ur: 5 mg/dL — AB
Leukocytes,Ua: NEGATIVE
Nitrite: NEGATIVE
Protein, ur: NEGATIVE mg/dL
Specific Gravity, Urine: 1.013 (ref 1.005–1.030)
pH: 6 (ref 5.0–8.0)

## 2021-03-23 MED ORDER — HYDROCODONE-ACETAMINOPHEN 5-325 MG PO TABS: 1.0000 | ORAL_TABLET | Freq: Four times a day (QID) | ORAL | Status: DC | PRN

## 2021-03-23 MED ORDER — MORPHINE SULFATE (PF) 2 MG/ML IV SOLN
0.5000 mg | INTRAVENOUS | Status: DC | PRN
  Administered 2021-03-24 – 2021-03-25 (×3): 0.5 mg via INTRAVENOUS
  Filled 2021-03-23 (×3): qty 1

## 2021-03-23 MED ORDER — HYDROCODONE-ACETAMINOPHEN 7.5-325 MG PO TABS
1.0000 | ORAL_TABLET | Freq: Four times a day (QID) | ORAL | Status: DC | PRN
  Administered 2021-03-23: 1 via ORAL
  Administered 2021-03-23 – 2021-03-24 (×2): 2 via ORAL
  Filled 2021-03-23 (×2): qty 2
  Filled 2021-03-23: qty 1

## 2021-03-23 NOTE — Progress Notes (Addendum)
PROGRESS NOTE    Stacie [name]  XBD:[id number] DOB: Nov 30, 1955 DOA: 03/19/2021 PCP: [name], NP   Brief Narrative:  Stacie [name] is a 66 y.o. female with PMH significant for HTN, HLD, stroke, diastolic CHF, depression, noncompliance to medication Patient presented to the ED on 03/19/2021 with fall at home while under the influence of alcohol leading to right hip pain and inability to ambulate.  Denies hitting head Not on blood thinners. In the ED, patient was hemodynamically stable.  Initial blood pressure was in 130s.   Initial labs with potassium low at 2.9, hemoglobin 10.2, WBC count 11.5 Right x-ray of the hips hip x-ray showed intertrochanteric right hip fracture with foreshortening and varus angulation as well as mildly displaced left greater trochanter fracture. CT head unremarkable Orthopedics was consulted from ED. Admitted to hospitalist service.  Assessment & Plan:  Bilateral hip fractures: (Right intertrochanteric hip fracture, left femur greater trochanter fracture) -Secondary to fall due to loss of balance while under the influence of alcohol -Orthopedics consulted.   -S/p right femoral intramedullary nail by orthopedic surgery on 03/20/2021. -Continue as needed pain management  -DVT prophylaxis per orthopedics-recommended Lovenox 40 mg once daily for 30 days -Consult PT/OT-recommended SNF -Follow-up with orthopedic surgery 2 weeks with Dr. Mardelle Matte   Hypertensive urgency -Blood is elevated likely due to pain. -Continue amlodipine 10 mg once daily, hydralazine 50 mg every 8 hours and Imdur 30 mg daily -As needed IV hydralazine -Monitor blood pressure closely.   Acute metabolic encephalopathy -This is likely from a combination of pain medicine, alcohol withdrawal as well as IV Ativan.   -Continue PRN IV Haldol.  -CT head on admission did not show any evidence of intracranial abnormality. -Changed frequency of opioids from every 4  hour to every 6 hour as needed. -Delirium precautions   Chronic alcohol use -CIWA protocol with scheduled Librium and as needed Ativan. -Counseled to quit alcohol   Severe hypokalemia -Resolved  Acute blood loss anemia: -H&H dropped from 10.2-8.2. -Monitor H&H closely and transfuse if less than 7  Severe iron deficiency Severe vitamin B12 deficiency -Ferritin level low at 10, vitamin B12 level low at 61 -Received IV iron infusion x4.  Continue B12 supplements   Chronic diastolic CHF -Currently compensated.  Last echo from 2021 with EF 60 to 83%, grade 1 diastolic dysfunction -Strict INO's and daily weight.  Monitor signs for fluid overload.  Scabies: -noted prior to the admission. She has generalized pruritus with scratches all over her body.  Permethrin cream x1 on 2/18..  On isolation precautions   History of stroke/hyperlipidemia Mild cognitive deficit -Currently aspirin on hold.  Continue statin  Severe protein calorie malnutrition -As evidenced by low weight of less than 100 pound, iron deficiency, vitamin B12 deficiency -Probably related to chronic alcohol use as well -Nutrition consult-appreciate help  Leukocytosis: Resolved  Thrombocytosis: Resolved  Vitamin D deficiency: Start vitamin D supplements q. Weekly  Addendum: Patient's right hip side: Swollen and erythematous.  Her hemoglobin dropped from 10-8.  I talked to on-call Ortho nurse practitioner to evaluate the patient- she recommended to get soft tissue ultrasound to rule out underlying etiology.    DVT prophylaxis: Lovenox Code Status: DNR Family Communication:  None present at bedside.  Plan of care discussed with patient in length and she verbalized understanding and agreed with it.  I called patient's daughter Stacie [name] and discussed plan of care and she verbalized understanding.  Disposition Plan: SNF-TOC consulted-await bed availability  Consultants:  Orthopedic surgery  Procedures:  Right hip  intramedullary nail insertion  Antimicrobials:  Operative prophylaxis-cefazolin   Status is: Inpatient    Subjective: Patient seen and examined.  Drowsy but arousable.  Continues to have pain.   As per RN: Patient was confused last night.  Remained afebrile.  objective: Vitals:   03/23/21 0410 03/23/21 0500 03/23/21 1003 03/23/21 1215  BP: (!) 148/67  (!) 153/75 (!) 153/74  Pulse: 98  85 72  Resp: 15   18  Temp: 98.2 F (36.8 C)     TempSrc: Oral   Oral  SpO2: 95%     Weight:  42.5 kg    Height:        Intake/Output Summary (Last 24 hours) at 03/23/2021 1330 Last data filed at 03/23/2021 0900 Gross per 24 hour  Intake 510 ml  Output --  Net 510 ml    Filed Weights   03/20/21 1529 03/23/21 0500  Weight: 43.1 kg 42.5 kg    Examination:  General exam: In pain on room air, appears older than her age, cachectic, has mittens in her hands respiratory system: Clear to auscultation. Respiratory effort normal. Cardiovascular system: S1 & S2 heard, RRR. No JVD, murmurs, rubs, gallops or clicks. No pedal edema. Gastrointestinal system: Abdomen is nondistended, soft and nontender. No organomegaly or masses felt. Normal bowel sounds heard. Central nervous system: Drowsy but arousable, in pain, following commands  extremities: Right hip: Bandage dry and intact.  Redness and swelling noted below right hip Skin: Multiple superficial red bumps noted all over the body   Data Reviewed: I have personally reviewed following labs and imaging studies  CBC: Recent Labs  Lab 03/19/21 2241 03/20/21 0045 03/21/21 0447 03/23/21 0634  WBC 11.5* 14.2* 12.9* 8.8  NEUTROABS 9.2*  --  10.3* 6.7  HGB 10.2* 10.3* 10.4* 8.2*  HCT 31.5* 32.2* 32.0* 26.2*  MCV 83.6 83.4 82.9 85.3  PLT 408* 418* 404* 161    Basic Metabolic Panel: Recent Labs  Lab 03/19/21 2241 03/20/21 0045 03/21/21 0447 03/23/21 0634  NA 134* 133* 131* 138  K 2.9* 2.8* 3.2* 3.5  CL 100 100 98 104  CO2 23 23 23 25    GLUCOSE 97 131* 122* 109*  BUN 14 13 16 11   CREATININE 0.54 0.39* 0.66 0.42*  CALCIUM 8.2* 8.3* 7.7* 8.5*  MG 1.9  --  1.8  --   PHOS 3.0  --   --   --     GFR: Estimated Creatinine Clearance: 47 mL/min (A) (by C-G formula based on SCr of 0.42 mg/dL (L)). Liver Function Tests: Recent Labs  Lab 03/19/21 2241  AST 23  ALT 20  ALKPHOS 59  BILITOT 0.2*  PROT 6.6  ALBUMIN 3.8    No results for input(s): LIPASE, AMYLASE in the last 168 hours. Recent Labs  Lab 03/19/21 2354  AMMONIA 12    Coagulation Profile: Recent Labs  Lab 03/19/21 2241  INR 0.9    Cardiac Enzymes: Recent Labs  Lab 03/19/21 2241  CKTOTAL 137    BNP (last 3 results) No results for input(s): PROBNP in the last 8760 hours. HbA1C: No results for input(s): HGBA1C in the last 72 hours. CBG: No results for input(s): GLUCAP in the last 168 hours. Lipid Profile: No results for input(s): CHOL, HDL, LDLCALC, TRIG, CHOLHDL, LDLDIRECT in the last 72 hours. Thyroid Function Tests: No results for input(s): TSH, T4TOTAL, FREET4, T3FREE, THYROIDAB in the last 72 hours.  Anemia Panel: No results for  input(s): VITAMINB12, FOLATE, FERRITIN, TIBC, IRON, RETICCTPCT in the last 72 hours.  Sepsis Labs: No results for input(s): PROCALCITON, LATICACIDVEN in the last 168 hours.  Recent Results (from the past 240 hour(s))  Resp Panel by RT-PCR (Flu A&B, Covid) Nasopharyngeal Swab     Status: None   Collection Time: 03/19/21 10:41 PM   Specimen: Nasopharyngeal Swab; Nasopharyngeal(NP) swabs in vial transport medium  Result Value Ref Range Status   SARS Coronavirus 2 by RT PCR NEGATIVE NEGATIVE Final    Comment: (NOTE) SARS-CoV-2 target nucleic acids are NOT DETECTED.  The SARS-CoV-2 RNA is generally detectable in upper respiratory specimens during the acute phase of infection. The lowest concentration of SARS-CoV-2 viral copies this assay can detect is 138 copies/mL. A negative result does not preclude  SARS-Cov-2 infection and should not be used as the sole basis for treatment or other patient management decisions. A negative result may occur with  improper specimen collection/handling, submission of specimen other than nasopharyngeal swab, presence of viral mutation(s) within the areas targeted by this assay, and inadequate number of viral copies(<138 copies/mL). A negative result must be combined with clinical observations, patient history, and epidemiological information. The expected result is Negative.  Fact Sheet for Patients:  EntrepreneurPulse.com.au  Fact Sheet for Healthcare Providers:  IncredibleEmployment.be  This test is no t yet approved or cleared by the [place] FDA and  has been authorized for detection and/or diagnosis of SARS-CoV-2 by FDA under an Emergency Use Authorization (EUA). This EUA will remain  in effect (meaning this test can be used) for the duration of the COVID-19 declaration under Section 564(b)(1) of the Act, 21 U.S.C.section 360bbb-3(b)(1), unless the authorization is terminated  or revoked sooner.       Influenza A by PCR NEGATIVE NEGATIVE Final   Influenza B by PCR NEGATIVE NEGATIVE Final    Comment: (NOTE) The Xpert Xpress SARS-CoV-2/FLU/RSV plus assay is intended as an aid in the diagnosis of influenza from Nasopharyngeal swab specimens and should not be used as a sole basis for treatment. Nasal washings and aspirates are unacceptable for Xpert Xpress SARS-CoV-2/FLU/RSV testing.  Fact Sheet for Patients: EntrepreneurPulse.com.au  Fact Sheet for Healthcare Providers: IncredibleEmployment.be  This test is not yet approved or cleared by the [place] FDA and has been authorized for detection and/or diagnosis of SARS-CoV-2 by FDA under an Emergency Use Authorization (EUA). This EUA will remain in effect (meaning this test can be used) for the duration of  the COVID-19 declaration under Section 564(b)(1) of the Act, 21 U.S.C. section 360bbb-3(b)(1), unless the authorization is terminated or revoked.  Performed at [facility], [place] [address], [place],  [address]        Radiology Studies: No results found.  Scheduled Meds:  amLODipine  10 mg Oral Daily   docusate sodium  100 mg Oral BID   enoxaparin (LOVENOX) injection  40 mg Subcutaneous Q24H   feeding supplement  237 mL Oral TID BM   folic acid  1 mg Oral Daily   hydrALAZINE  50 mg Oral Q8H   isosorbide mononitrate  30 mg Oral Daily   mouth rinse  15 mL Mouth Rinse BID   multivitamin with minerals  1 tablet Oral Daily   senna  1 tablet Oral BID   simvastatin  20 mg Oral q1800   thiamine  100 mg Oral Daily   Or   thiamine  100 mg Intravenous Daily   vitamin B-12  1,000 mcg Oral Daily  Vitamin D (Ergocalciferol)  50,000 Units Oral Q7 days   Continuous Infusions:     LOS: 4 days   Time spent: 35 minutes   Stacie Haigh Loann Quill, MD [facility]  If 7PM-7AM, please contact night-coverage [email or web address] 03/23/2021, 1:30 PM

## 2021-03-23 NOTE — Plan of Care (Addendum)
Pt increasingly drowsy at start of shift, unable to tolerate PO medications. Pt given PRN IV pain medications and IV robaxin. Pt increasingly agitated and vocal. Pt physically aggressive but redirectable. Attempted mittens for pt but she becoming increasingly agitated and vocal. Redirection provided. Pt continues to remove telemetry. Pt UOP adequate.VSS. Pt removed surgical dressing, Sports Med notified. Site clean and intact at this time. Incisions are approximated with no bleeding or drainage. Orders to replace dressing with steri-strips and Aquacel and provider will round on pt.

## 2021-03-23 NOTE — Plan of Care (Signed)
  Problem: Activity: Goal: Risk for activity intolerance will decrease Outcome: Not Progressing   Problem: Nutrition: Goal: Adequate nutrition will be maintained Outcome: Not Progressing   

## 2021-03-23 NOTE — TOC Progression Note (Signed)
Transition of Care [facility]) - Progression Note    Patient Details  Name: Tylar Merendino MRN: [id number] Date of Birth: 1955/04/07  [facility] CM/SW Contact  Heather Mckendree, Marta Lamas, [place] Phone Number: 03/23/2021, 1:49 PM  Clinical Narrative:     LCSW was able to make contact with patient's daughter, Hector Brunswick today, to provide her with bed offers received for patient thus far.  Mrs. Billey Chang agreed to look into [facility], as well as [facility] for possible placement.  TOC CM/SW will contact Mrs. Horsely if additional bed offers are received.  Expected Discharge Plan: Skilled Nursing Facility Barriers to Discharge: Continued Medical Work up  Expected Discharge Plan and Services Expected Discharge Plan: [place]   Social Determinants of Health (SDOH) Interventions    Readmission Risk Interventions Readmission Risk Prevention Plan 03/21/2021  Transportation Screening Complete  PCP or Specialist Appt within 3-5 Days Complete  HRI or [place] Complete  Social Work Consult for [place] Planning/Counseling Complete  Palliative Care Screening Complete  Medication Review [employer]) Complete  Some recent data might be hidden

## 2021-03-23 NOTE — Progress Notes (Signed)
Subjective: 3 Days Post-Op s/p Procedure(s): INTRAMEDULLARY (IM) NAIL FEMORAL  Patient very somnolent today. Says she is having pain. Does not answer any other questions. Falls asleep during questioning.   Objective:  PE: VITALS:   Vitals:   03/23/21 1003 03/23/21 1215 03/23/21 1601 03/23/21 1626  BP: (!) 153/75 (!) 153/74    Pulse: 85 72    Resp:  18  20  Temp:  (!) 97.4 F (36.3 C) 100.2 F (37.9 C) 98.3 F (36.8 C)  TempSrc:  Oral Rectal Oral  SpO2:      Weight:      Height:       General: laying in bed, somnolent RLE: Dressings removed. Incisions CDI. New steri-strips placed. New bandages placed. No surrounding erythema or drainage from incisions. She does have erythema of inguinal fold into inner thigh. Urine present on her inner thigh. Swelling of the right inner thigh. She actively flexes and extends her knee and ankle some when attempting to roll over in bed. Does not follow commands.   LABS  Results for orders placed or performed during the hospital encounter of 03/19/21 (from the past 24 hour(s))  Basic metabolic panel     Status: Abnormal   Collection Time: 03/23/21  6:34 AM  Result Value Ref Range   Sodium 138 135 - 145 mmol/L   Potassium 3.5 3.5 - 5.1 mmol/L   Chloride 104 98 - 111 mmol/L   CO2 25 22 - 32 mmol/L   Glucose, Bld 109 (H) 70 - 99 mg/dL   BUN 11 8 - 23 mg/dL   Creatinine, Ser 0.42 (L) 0.44 - 1.00 mg/dL   Calcium 8.5 (L) 8.9 - 10.3 mg/dL   GFR, Estimated >60 >60 mL/min   Anion gap 9 5 - 15  CBC with Differential/Platelet     Status: Abnormal   Collection Time: 03/23/21  6:34 AM  Result Value Ref Range   WBC 8.8 4.0 - 10.5 K/uL   RBC 3.07 (L) 3.87 - 5.11 MIL/uL   Hemoglobin 8.2 (L) 12.0 - 15.0 g/dL   HCT 26.2 (L) 36.0 - 46.0 %   MCV 85.3 80.0 - 100.0 fL   MCH 26.7 26.0 - 34.0 pg   MCHC 31.3 30.0 - 36.0 g/dL   RDW 13.6 11.5 - 15.5 %   Platelets 291 150 - 400 K/uL   nRBC 0.0 0.0 - 0.2 %   Neutrophils Relative % 76 %   Neutro Abs 6.7  1.7 - 7.7 K/uL   Lymphocytes Relative 11 %   Lymphs Abs 1.0 0.7 - 4.0 K/uL   Monocytes Relative 9 %   Monocytes Absolute 0.8 0.1 - 1.0 K/uL   Eosinophils Relative 2 %   Eosinophils Absolute 0.1 0.0 - 0.5 K/uL   Basophils Relative 1 %   Basophils Absolute 0.1 0.0 - 0.1 K/uL   Immature Granulocytes 1 %   Abs Immature Granulocytes 0.11 (H) 0.00 - 0.07 K/uL     No results found.  Assessment/Plan: Right intertrochanteric hip fracture Left femur greater trochanter fracture  3 Days Post-Op s/p Procedure(s): INTRAMEDULLARY (IM) NAIL FEMORAL  Weightbearing: WBAT BLE Insicional and dressing care: reinforce dressings as needed, put on new dressings today. VTE prophylaxis: Lovenox 40mg  qd x 30 days Pain control: continue current regimen Follow - up plan: 2 weeks with Dr. Mardelle Matte Dispo: PT recommending SNF, TOC working on placement  Patient having new redness and swelling of the right inner thigh. Ultrasound has been ordered. Patient  also apparently urinating frequently and on herself. Will order UA. Unclear as to why she is having this new onset redness and swelling. Her incisions are all pristine and the erythema is more medial to her incision sites. Will also order post-op x-rays. Will continue to monitor.   Contact information:   After hours and holidays please check [email or web address] for group call information for [facility]  Ethelda Chick 03/23/2021, 5:26 PM

## 2021-03-24 ENCOUNTER — Inpatient Hospital Stay (HOSPITAL_COMMUNITY): Payer: Medicare Other

## 2021-03-24 DIAGNOSIS — R410 Disorientation, unspecified: Secondary | ICD-10-CM

## 2021-03-24 LAB — BASIC METABOLIC PANEL
Anion gap: 7 (ref 5–15)
BUN: 9 mg/dL (ref 8–23)
CO2: 28 mmol/L (ref 22–32)
Calcium: 8.5 mg/dL — ABNORMAL LOW (ref 8.9–10.3)
Chloride: 102 mmol/L (ref 98–111)
Creatinine, Ser: 0.49 mg/dL (ref 0.44–1.00)
GFR, Estimated: >60 mL/min (ref >60)
Glucose, Bld: 106 mg/dL — ABNORMAL HIGH (ref 70–99)
Potassium: 3.5 mmol/L (ref 3.5–5.1)
Sodium: 137 mmol/L (ref 135–145)

## 2021-03-24 LAB — CBC
HCT: 29.8 % — ABNORMAL LOW (ref 36.0–46.0)
Hemoglobin: 9.6 g/dL — ABNORMAL LOW (ref 12.0–15.0)
MCH: 27.4 pg (ref 26.0–34.0)
MCHC: 32.2 g/dL (ref 30.0–36.0)
MCV: 85.1 fL (ref 80.0–100.0)
Platelets: 348 10*3/uL (ref 150–400)
RBC: 3.5 MIL/uL — ABNORMAL LOW (ref 3.87–5.11)
RDW: 13.9 % (ref 11.5–15.5)
WBC: 8.7 10*3/uL (ref 4.0–10.5)
nRBC: 0 % (ref 0.0–0.2)

## 2021-03-24 MED ORDER — LORAZEPAM 1 MG PO TABS: 1.0000 mg | ORAL_TABLET | ORAL | Status: AC | PRN

## 2021-03-24 MED ORDER — LABETALOL HCL 5 MG/ML IV SOLN
5.0000 mg | INTRAVENOUS | Status: DC | PRN
  Administered 2021-03-24: 5 mg via INTRAVENOUS
  Filled 2021-03-24: qty 4

## 2021-03-24 MED ORDER — VANCOMYCIN HCL 750 MG/150ML IV SOLN
750.0000 mg | INTRAVENOUS | Status: DC
  Filled 2021-03-24: qty 150

## 2021-03-24 MED ORDER — BISACODYL 10 MG RE SUPP
10.0000 mg | Freq: Once | RECTAL | Status: AC
  Administered 2021-03-24: 10 mg via RECTAL
  Filled 2021-03-24: qty 1

## 2021-03-24 MED ORDER — BUDESONIDE 3 MG PO CPEP
9.0000 mg | ORAL_CAPSULE | Freq: Every day | ORAL | Status: DC
  Administered 2021-03-25 – 2021-03-28 (×4): 9 mg via ORAL
  Filled 2021-03-24 (×5): qty 3

## 2021-03-24 MED ORDER — IOHEXOL 300 MG/ML  SOLN
80.0000 mL | Freq: Once | INTRAMUSCULAR | Status: AC | PRN
  Administered 2021-03-24: 80 mL via INTRAVENOUS

## 2021-03-24 MED ORDER — SODIUM CHLORIDE (PF) 0.9 % IJ SOLN
INTRAMUSCULAR | Status: AC
  Administered 2021-03-24: 10 mL
  Filled 2021-03-24: qty 50

## 2021-03-24 MED ORDER — VANCOMYCIN HCL 1000 MG/200ML IV SOLN
1000.0000 mg | Freq: Once | INTRAVENOUS | Status: AC
  Administered 2021-03-24: 1000 mg via INTRAVENOUS
  Filled 2021-03-24: qty 200

## 2021-03-24 MED ORDER — CHLORHEXIDINE GLUCONATE CLOTH 2 % EX PADS
6.0000 | MEDICATED_PAD | Freq: Every day | CUTANEOUS | Status: DC
  Administered 2021-03-24 – 2021-03-28 (×4): 6 via TOPICAL

## 2021-03-24 MED ORDER — LORAZEPAM 2 MG/ML IJ SOLN
1.0000 mg | INTRAMUSCULAR | Status: AC | PRN
  Administered 2021-03-24: 2 mg via INTRAVENOUS
  Administered 2021-03-24: 1 mg via INTRAVENOUS
  Administered 2021-03-25: 2 mg via INTRAVENOUS
  Administered 2021-03-25: 1 mg via INTRAVENOUS
  Administered 2021-03-25 – 2021-03-26 (×4): 2 mg via INTRAVENOUS
  Filled 2021-03-24 (×8): qty 1

## 2021-03-24 MED ORDER — SODIUM CHLORIDE 0.9 % IV SOLN
2.0000 g | Freq: Two times a day (BID) | INTRAVENOUS | Status: DC
  Administered 2021-03-25: 2 g via INTRAVENOUS
  Filled 2021-03-24 (×2): qty 2

## 2021-03-24 MED ORDER — CYANOCOBALAMIN 1000 MCG/ML IJ SOLN
1000.0000 ug | Freq: Every day | INTRAMUSCULAR | Status: AC
  Administered 2021-03-24 – 2021-03-27 (×4): 1000 ug via INTRAMUSCULAR
  Filled 2021-03-24 (×4): qty 1

## 2021-03-24 MED ORDER — SODIUM CHLORIDE 0.9 % IV SOLN
2.0000 g | Freq: Once | INTRAVENOUS | Status: AC
  Administered 2021-03-24: 2 g via INTRAVENOUS
  Filled 2021-03-24: qty 2

## 2021-03-24 MED ORDER — ESCITALOPRAM OXALATE 20 MG PO TABS
20.0000 mg | ORAL_TABLET | Freq: Every day | ORAL | Status: DC
  Administered 2021-03-25 – 2021-03-28 (×4): 20 mg via ORAL
  Filled 2021-03-24 (×5): qty 1

## 2021-03-24 NOTE — Progress Notes (Signed)
Reviewed CT scan.  Medial thigh with cellulitis vs.dermatitis.  No drainable fluid collection.  Some of this may be contributed by her urinary incontinence and mental status challenges.  This does not appear to communicate to her surgical site.  Ok for antibiotics from my standpoint.    Will follow.    Johnny Bridge, MD

## 2021-03-24 NOTE — Evaluation (Signed)
Clinical/Bedside Swallow Evaluation Patient Details  Name: Anvika Gashi MRN: [id number] Date of Birth: 09-29-1955  Today's Date: 03/24/2021 Time: SLP Start Time (ACUTE ONLY): 1205 SLP Stop Time (ACUTE ONLY): 1225 SLP Time Calculation (min) (ACUTE ONLY): 20 min  Past Medical History:  Past Medical History:  Diagnosis Date   Colitis    Hyperlipidemia    Hypertension    Stroke ([place]) 04/2012   unknown type   Thyroid disease    Past Surgical History:  Past Surgical History:  Procedure Laterality Date   FEMUR IM NAIL Right 03/20/2021   Procedure: INTRAMEDULLARY (IM) NAIL FEMORAL;  Surgeon: Marchia Bond, MD;  Location: [facility] ORS;  Service: Orthopedics;  Laterality: Right;   HPI:  Patient is a 66 y.o. female with PMH: ETOH abuse, diastolic CHF, HLD, HTN, depression, CVA, noncompliance with medication who presented to ED on 2/15 with fall at home while under influence of alcohol, leading to right hipo pain and inability to ambulate. In ED patient was hemodynamically stable, x-ray of right hip showed intertrochanteric hipo fracture with foreshortening and varus angulation as well as mildly displaced left greater trochanter fracture. CT head unremarkable. She underwent right femoral intramedullary nail by orthopedic surgery on 2/16. She was started on a regular solids, thin liquids diet however night shift RN reported on 2/20 that patient coughed with dose of PRN pain medication and as h/o dysphagia noted in chart, SLP swallow evaluation ordered.    Assessment / Plan / Recommendation  Clinical Impression  Patient presents with clinical s/s of dysphagia as per this bedside/clinical swallow evaluation. At this time, dysphagia is impacted significantly by patient's cognitive status with patient being confused, not following commands, not adequately aware of surroundings, etc. Patient was calling out "help me" frequently but unable to elaborate. She did appear to attempt pressure relieve of  right hip a few times. Daughter who is present in room reported that patient was able to eat her breakfast previous date without difficulty but she did not eat lunch. This morning, patient would not open mouth for pills and was also not responsive to eating any PO's on breakfast tray. SLP is recommending NPO at this time as patient does not appear safe for any PO's. As her mentation improves, she should be able to advance back to PO's. SLP to follow patient for readiness to trial PO's. SLP Visit Diagnosis: Dysphagia, unspecified (R13.10)    Aspiration Risk  Moderate aspiration risk;Risk for inadequate nutrition/hydration    Diet Recommendation NPO   Medication Administration: Via alternative means    Other  Recommendations Oral Care Recommendations: Other (Comment);Staff/trained caregiver to provide oral care (oral care as patient tolerates)    Recommendations for follow up therapy are one component of a multi-disciplinary discharge planning process, led by the attending physician.  Recommendations may be updated based on patient status, additional functional criteria and insurance authorization.  Follow up Recommendations Skilled nursing-short term rehab (<3 hours/day)      Assistance Recommended at Discharge Frequent or constant Supervision/Assistance  Functional Status Assessment Patient has had a recent decline in their functional status and demonstrates the ability to make significant improvements in function in a reasonable and predictable amount of time.  Frequency and Duration min 2x/week  1 week       Prognosis Prognosis for Safe Diet Advancement: Good      Swallow Study   General Date of Onset: 03/24/21 HPI: Patient is a 66 y.o. female with PMH: ETOH abuse, diastolic CHF, HLD, HTN, depression,  CVA, noncompliance with medication who presented to ED on 2/15 with fall at home while under influence of alcohol, leading to right hipo pain and inability to ambulate. In ED patient  was hemodynamically stable, x-ray of right hip showed intertrochanteric hipo fracture with foreshortening and varus angulation as well as mildly displaced left greater trochanter fracture. CT head unremarkable. She underwent right femoral intramedullary nail by orthopedic surgery on 2/16. She was started on a regular solids, thin liquids diet however night shift RN reported on 2/20 that patient coughed with dose of PRN pain medication and as h/o dysphagia noted in chart, SLP swallow evaluation ordered. Type of Study: Bedside Swallow Evaluation Previous Swallow Assessment: during previous admission Diet Prior to this Study: Regular;Thin liquids Temperature Spikes Noted: No Respiratory Status: Room air History of Recent Intubation: No Behavior/Cognition: Alert;Requires cueing;Doesn't follow directions;Distractible;Confused;Agitated Oral Cavity Assessment: Other (comment) (unable to assess due to patient not following commands, lips appear dry) Oral Care Completed by SLP: Other (Comment) (unable due to patient not complying) Oral Cavity - Dentition: Adequate natural dentition Self-Feeding Abilities: Total assist Patient Positioning: Upright in bed Baseline Vocal Quality: Normal Volitional Cough: Cognitively unable to elicit Volitional Swallow: Unable to elicit    Oral/Motor/Sensory Function Overall Oral Motor/Sensory Function: Other (comment) (appears WFL but patient unable to follow commands)   Ice Chips     Thin Liquid Thin Liquid: Impaired Presentation: Straw Oral Phase Impairments: Poor awareness of bolus Pharyngeal  Phase Impairments: Suspected delayed Swallow;Cough - Immediate    Nectar Thick     Honey Thick     Puree Puree: Not tested   Solid     Solid: Not tested      Sonia Baller, MA, CCC-SLP Speech Therapy

## 2021-03-24 NOTE — Progress Notes (Signed)
Pharmacy Antibiotic Note  Stacie [name] is a 66 y.o. female admitted on 03/19/2021 with cellulitis.  Pharmacy has been consulted for Cefepime & Vancomycin dosing.  Plan: Cefepime 2gm IV q12h Vancomycin 1gm IV x1 then 750mg  IV q24h to target AUC 400-550 Check Vancomycin levels at steady state Monitor renal function and cx data   Height: 5' 4.25" (163.2 cm) Weight: 42.5 kg (93 lb 11.1 oz) IBW/kg (Calculated) : 55.28  Temp (24hrs), Avg:98.9 F (37.2 C), Min:98.3 F (36.8 C), Max:100.2 F (37.9 C)  Recent Labs  Lab 03/19/21 2241 03/20/21 0045 03/21/21 0447 03/23/21 0634 03/24/21 0853  WBC 11.5* 14.2* 12.9* 8.8 8.7  CREATININE 0.54 0.39* 0.66 0.42* 0.49    Estimated Creatinine Clearance: 47 mL/min (by C-G formula based on SCr of 0.49 mg/dL).    Allergies  Allergen Reactions   Trazodone And Nefazodone Nausea And Vomiting   Wellbutrin [Bupropion] Nausea And Vomiting    Antimicrobials this admission: 2/20 Cefepime >>  2/20 Vancomycin >>   Dose adjustments this admission:  Microbiology results: 2/20 BCx:   Thank you for allowing pharmacy to be a part of this patients care.  Netta Cedars PharmD 03/24/2021 3:01 PM

## 2021-03-24 NOTE — Progress Notes (Signed)
PROGRESS NOTE    Stacie [name]  RSW:[id number] DOB: 1955-09-14 DOA: 03/19/2021 PCP: [name], NP   Brief Narrative:  Stacie [name] is a 66 y.o. female with PMH significant for HTN, HLD, stroke, diastolic CHF, depression, noncompliance to medication Patient presented to the ED on 03/19/2021 with fall at home while under the influence of alcohol leading to right hip pain and inability to ambulate.  Denies hitting head Not on blood thinners. In the ED, patient was hemodynamically stable.  Initial blood pressure was in 130s.   Initial labs with potassium low at 2.9, hemoglobin 10.2, WBC count 11.5 Right x-ray of the hips hip x-ray showed intertrochanteric right hip fracture with foreshortening and varus angulation as well as mildly displaced left greater trochanter fracture. CT head unremarkable Orthopedics was consulted from ED. Admitted to hospitalist service.  Assessment & Plan:  Bilateral hip fractures: (Right intertrochanteric hip fracture, left femur greater trochanter fracture) -Secondary to fall due to loss of balance while under the influence of alcohol -Orthopedics consulted.   -S/p right femoral intramedullary nail by orthopedic surgery on 03/20/2021. -Continue as needed pain management  -DVT prophylaxis per orthopedics-recommended Lovenox 40 mg once daily for 30 days -Consult PT/OT-recommended SNF -Follow-up with orthopedic surgery 2 weeks with Dr. Mardelle Matte   right medial thigh swelling and redness -xray and u/s done -CT right femur w/ and w/o contrast  -per ortho  Hypertensive urgency -Blood is elevated likely due to pain. -Continue amlodipine 10 mg once daily, hydralazine 50 mg every 8 hours and Imdur 30 mg daily -As needed IV hydralazine while not able to swallow safely  Dysphagia -SLP eval -NPO for now   Acute metabolic encephalopathy -time frame unclear-- appears to have had since admission, daughter states worse on 2/18 -This is  likely from a combination but will repeat head CT as she has a h/o CVA.   -Continue PRN IV Haldol.  -CT head on admission did not show any evidence of intracranial abnormality-repeat 2/20 -Delirium precautions -bladder scan   Chronic alcohol use -CIWA protocol  -unclear how much she drinks -alcohol level 111 at admission   Severe hypokalemia -Resolved  Acute blood loss anemia: -H&H dropped from 10.2-8.2. -Monitor H&H closely and transfuse if less than 7  Severe iron deficiency Severe vitamin B12 deficiency -Ferritin level low at 10, vitamin B12 level low at 61 -Received IV iron infusions  Continue B12 supplements but CHANGE TO IM   Chronic diastolic CHF -Currently compensated.  Last echo from 2021 with EF 60 to 09%, grade 1 diastolic dysfunction -Strict INO's and daily weight.  Monitor signs for fluid overload.  Scabies: -noted prior to the admission. She has generalized pruritus with scratches all over her body.  Permethrin cream x1 on 2/18..  On isolation precautions   History of stroke/hyperlipidemia Mild cognitive deficit -Currently aspirin on hold.  Continue statin -repeat head CT on2/20 for continued symptoms of AMS  Severe protein calorie malnutrition -As evidenced by low weight of less than 100 pound, iron deficiency, vitamin B12 deficiency -Probably related to chronic alcohol use as well -Nutrition consult-appreciate help Nutrition Status: Nutrition Problem: Severe Malnutrition Etiology: chronic illness (EtOH abuse) Signs/Symptoms: severe fat depletion, severe muscle depletion Interventions: Ensure Enlive (each supplement provides 350kcal and 20 grams of protein), MVI   Leukocytosis:  -Resolved  Thrombocytosis:  -Resolved  Vitamin D deficiency:  - vitamin D supplements    DVT prophylaxis: Lovenox Code Status: DNR Family Communication:  spoke with daughter [name]  Disposition  Plan: still with delirium  Consultants:  Orthopedic  surgery  Procedures:  Right hip intramedullary nail insertion  Antimicrobials:  Operative prophylaxis-cefazolin       Subjective: Remains confused-- per chart review has been confused on and off since admission Daughter states patient does not drink as much as she has in the past and has never had alcohol withdrawal  objective: Vitals:   03/23/21 1601 03/23/21 1626 03/23/21 2056 03/24/21 0914  BP:   (!) 155/81 (!) 174/77  Pulse:   91 (!) 110  Resp:  20 20   Temp: 100.2 F (37.9 C) 98.3 F (36.8 C) 98.6 F (37 C)   TempSrc: Rectal Oral Oral   SpO2:   97%   Weight:      Height:        Intake/Output Summary (Last 24 hours) at 03/24/2021 1238 Last data filed at 03/24/2021 1030 Gross per 24 hour  Intake 627 ml  Output 2200 ml  Net -1573 ml   Filed Weights   03/20/21 1529 03/23/21 0500  Weight: 43.1 kg 42.5 kg    Examination:   General: Appearance:    Cachectic female who appears delirious      Lungs:     respirations unlabored  Heart:    Tachycardic.   MS:   All extremities are intact.    Neurologic:   Eyes closed, not answering questions      Data Reviewed: I have personally reviewed following labs and imaging studies  CBC: Recent Labs  Lab 03/19/21 2241 03/20/21 0045 03/21/21 0447 03/23/21 0634 03/24/21 0853  WBC 11.5* 14.2* 12.9* 8.8 8.7  NEUTROABS 9.2*  --  10.3* 6.7  --   HGB 10.2* 10.3* 10.4* 8.2* 9.6*  HCT 31.5* 32.2* 32.0* 26.2* 29.8*  MCV 83.6 83.4 82.9 85.3 85.1  PLT 408* 418* 404* 291 301   Basic Metabolic Panel: Recent Labs  Lab 03/19/21 2241 03/20/21 0045 03/21/21 0447 03/23/21 0634 03/24/21 0853  NA 134* 133* 131* 138 137  K 2.9* 2.8* 3.2* 3.5 3.5  CL 100 100 98 104 102  CO2 23 23 23 25 28   GLUCOSE 97 131* 122* 109* 106*  BUN 14 13 16 11 9   CREATININE 0.54 0.39* 0.66 0.42* 0.49  CALCIUM 8.2* 8.3* 7.7* 8.5* 8.5*  MG 1.9  --  1.8  --   --   PHOS 3.0  --   --   --   --    GFR: Estimated Creatinine Clearance: 47 mL/min  (by C-G formula based on SCr of 0.49 mg/dL). Liver Function Tests: Recent Labs  Lab 03/19/21 2241  AST 23  ALT 20  ALKPHOS 59  BILITOT 0.2*  PROT 6.6  ALBUMIN 3.8   No results for input(s): LIPASE, AMYLASE in the last 168 hours. Recent Labs  Lab 03/19/21 2354  AMMONIA 12   Coagulation Profile: Recent Labs  Lab 03/19/21 2241  INR 0.9   Cardiac Enzymes: Recent Labs  Lab 03/19/21 2241  CKTOTAL 137   BNP (last 3 results) No results for input(s): PROBNP in the last 8760 hours. HbA1C: No results for input(s): HGBA1C in the last 72 hours. CBG: No results for input(s): GLUCAP in the last 168 hours. Lipid Profile: No results for input(s): CHOL, HDL, LDLCALC, TRIG, CHOLHDL, LDLDIRECT in the last 72 hours. Thyroid Function Tests: No results for input(s): TSH, T4TOTAL, FREET4, T3FREE, THYROIDAB in the last 72 hours.  Anemia Panel: No results for input(s): VITAMINB12, FOLATE, FERRITIN, TIBC, IRON, RETICCTPCT in the  last 72 hours.  Sepsis Labs: No results for input(s): PROCALCITON, LATICACIDVEN in the last 168 hours.  Recent Results (from the past 240 hour(s))  Resp Panel by RT-PCR (Flu A&B, Covid) Nasopharyngeal Swab     Status: None   Collection Time: 03/19/21 10:41 PM   Specimen: Nasopharyngeal Swab; Nasopharyngeal(NP) swabs in vial transport medium  Result Value Ref Range Status   SARS Coronavirus 2 by RT PCR NEGATIVE NEGATIVE Final    Comment: (NOTE) SARS-CoV-2 target nucleic acids are NOT DETECTED.  The SARS-CoV-2 RNA is generally detectable in upper respiratory specimens during the acute phase of infection. The lowest concentration of SARS-CoV-2 viral copies this assay can detect is 138 copies/mL. A negative result does not preclude SARS-Cov-2 infection and should not be used as the sole basis for treatment or other patient management decisions. A negative result may occur with  improper specimen collection/handling, submission of specimen other than  nasopharyngeal swab, presence of viral mutation(s) within the areas targeted by this assay, and inadequate number of viral copies(<138 copies/mL). A negative result must be combined with clinical observations, patient history, and epidemiological information. The expected result is Negative.  Fact Sheet for Patients:  EntrepreneurPulse.com.au  Fact Sheet for Healthcare Providers:  IncredibleEmployment.be  This test is no t yet approved or cleared by the [place] FDA and  has been authorized for detection and/or diagnosis of SARS-CoV-2 by FDA under an Emergency Use Authorization (EUA). This EUA will remain  in effect (meaning this test can be used) for the duration of the COVID-19 declaration under Section 564(b)(1) of the Act, 21 U.S.C.section 360bbb-3(b)(1), unless the authorization is terminated  or revoked sooner.       Influenza A by PCR NEGATIVE NEGATIVE Final   Influenza B by PCR NEGATIVE NEGATIVE Final    Comment: (NOTE) The Xpert Xpress SARS-CoV-2/FLU/RSV plus assay is intended as an aid in the diagnosis of influenza from Nasopharyngeal swab specimens and should not be used as a sole basis for treatment. Nasal washings and aspirates are unacceptable for Xpert Xpress SARS-CoV-2/FLU/RSV testing.  Fact Sheet for Patients: EntrepreneurPulse.com.au  Fact Sheet for Healthcare Providers: IncredibleEmployment.be  This test is not yet approved or cleared by the [place] FDA and has been authorized for detection and/or diagnosis of SARS-CoV-2 by FDA under an Emergency Use Authorization (EUA). This EUA will remain in effect (meaning this test can be used) for the duration of the COVID-19 declaration under Section 564(b)(1) of the Act, 21 U.S.C. section 360bbb-3(b)(1), unless the authorization is terminated or revoked.  Performed at [facility], [place] [address] [place], [place] [address]        Radiology Studies: [place] RT LOWER EXTREM LTD SOFT TISSUE NON VASCULAR  Result Date: 03/23/2021 CLINICAL DATA:  Swelling of the right inner thigh. Recent placement of right femoral intramedullary nail on 03/20/2021. EXAM: ULTRASOUND RIGHT LOWER EXTREMITY LIMITED TECHNIQUE: Ultrasound examination of the lower extremity soft tissues was performed in the area of clinical concern. COMPARISON:  None. FINDINGS: Targeted ultrasound of the inner upper right thigh, at the patient indicated area of clinical concern, demonstrates marked subcutaneous edema. A vague hypoechoic area measuring approximately 3.2 x 1.7 x 2.4 cm with internal vascularity is noted at the level of the musculature. Evaluation was suboptimal secondary to patient pain and difficulty with moving the right lower extremity. IMPRESSION: Subcutaneous edema and vague hypoechoic area measuring approximately 3.2 cm at the patient indicated area of concern in the inner right thigh. Findings are  nonspecific with main differential considerations including hematoma or potentially early abscess formation in the appropriate clinical context. If there is clinical concern for active extravasation with hematoma formation, recommend CT of the right lower extremity with and without intravenous contrast. Electronically Signed   By: Ileana Roup M.D.   On: 03/23/2021 18:46   DG FEMUR PORT, MIN 2 VIEWS RIGHT  Result Date: 03/23/2021 CLINICAL DATA:  Three days status post right intramedullary nail placement. New redness and swelling of the right inner thigh. EXAM: RIGHT FEMUR PORTABLE 2 VIEW COMPARISON:  Radiograph 03/20/2021 FINDINGS: Intramedullary nail fixation of right intertrochanteric fracture. Hardware is intact and in appropriate position, unchanged compared to postoperative radiograph on 03/20/2021. Interval development of ill-defined soft tissue density in the upper inner right thigh. No subcutaneous emphysema in this  region. Resolving subcutaneous emphysema is noted in the lateral soft tissues of the right hip. IMPRESSION: 1. Interval development of ill-defined soft tissue density in the upper inner right thigh, at the described location of new redness and swelling, is concerning for possible infection versus hematoma. 2. Right intramedullary nail fixation hardware is intact without evidence of complication. Electronically Signed   By: Ileana Roup M.D.   On: 03/23/2021 19:22    Scheduled Meds:  amLODipine  10 mg Oral Daily   budesonide  9 mg Oral Daily   cyanocobalamin  1,000 mcg Intramuscular Daily   docusate sodium  100 mg Oral BID   enoxaparin (LOVENOX) injection  40 mg Subcutaneous Q24H   escitalopram  20 mg Oral Daily   feeding supplement  237 mL Oral TID BM   folic acid  1 mg Oral Daily   hydrALAZINE  50 mg Oral Q8H   isosorbide mononitrate  30 mg Oral Daily   mouth rinse  15 mL Mouth Rinse BID   multivitamin with minerals  1 tablet Oral Daily   senna  1 tablet Oral BID   simvastatin  20 mg Oral q1800   sodium chloride (PF)       thiamine  100 mg Oral Daily   Or   thiamine  100 mg Intravenous Daily   Vitamin D (Ergocalciferol)  50,000 Units Oral Q7 days   Continuous Infusions:     LOS: 5 days   Time spent: 55 minutes   Geradine Girt, DO [facility]  If 7PM-7AM, please contact night-coverage [email or web address] 03/24/2021, 12:38 PM

## 2021-03-24 NOTE — Plan of Care (Signed)
Pt has improved orientation when awake compared to previous night. Pt attention improved to hold short conversations. Pain more controlled with PRN PO pain medications. Concerns for pt aspiration, coughing with last dose of PRN pain medication. Noted to have a history of dysphagia. Agitation also improved with delirium protocol in place and discontinuation of telemetry.

## 2021-03-24 NOTE — Progress Notes (Signed)
Subjective: 4 Days Post-Op s/p Procedure(s): INTRAMEDULLARY (IM) NAIL FEMORAL  Patient somnolent on exam. States "I'm not doing well" when asked this morning. Asked where she is hurt, but patient states "everywhere". Patient falls back asleep multiple times on exam   Objective:  PE: VITALS:   Vitals:   03/23/21 1601 03/23/21 1626 03/23/21 2056 03/24/21 0914  BP:   (!) 155/81 (!) 174/77  Pulse:   91   Resp:  20 20   Temp: 100.2 F (37.9 C) 98.3 F (36.8 C) 98.6 F (37 C)   TempSrc: Rectal Oral Oral   SpO2:   97%   Weight:      Height:       General: laying in bed MSK: sensation intact distally at both feet. 2+ DP and PT pulses bilaterally. Dorsiflexion and plantarflexion intact bilaterally. Dressings CDI with no signs of drainage. No ecchymosis to lateral or medial right leg. Mild erythema to medial right thigh. TTP to left greater trochanter. Able to flex and extend at bilateral knees and hips.   LABS  Results for orders placed or performed during the hospital encounter of 03/19/21 (from the past 24 hour(s))  Urinalysis, Routine w reflex microscopic Urine, In & Out Cath     Status: Abnormal   Collection Time: 03/23/21  5:38 PM  Result Value Ref Range   Color, Urine YELLOW YELLOW   APPearance CLEAR CLEAR   Specific Gravity, Urine 1.013 1.005 - 1.030   pH 6.0 5.0 - 8.0   Glucose, UA NEGATIVE NEGATIVE mg/dL   Hgb urine dipstick MODERATE (A) NEGATIVE   Bilirubin Urine NEGATIVE NEGATIVE   Ketones, ur 5 (A) NEGATIVE mg/dL   Protein, ur NEGATIVE NEGATIVE mg/dL   Nitrite NEGATIVE NEGATIVE   Leukocytes,Ua NEGATIVE NEGATIVE   RBC / HPF 0-5 0 - 5 RBC/hpf   WBC, UA 0-5 0 - 5 WBC/hpf   Bacteria, UA NONE SEEN NONE SEEN   Mucus PRESENT   CBC     Status: Abnormal   Collection Time: 03/24/21  8:53 AM  Result Value Ref Range   WBC 8.7 4.0 - 10.5 K/uL   RBC 3.50 (L) 3.87 - 5.11 MIL/uL   Hemoglobin 9.6 (L) 12.0 - 15.0 g/dL   HCT 29.8 (L) 36.0 - 46.0 %   MCV 85.1 80.0 - 100.0  fL   MCH 27.4 26.0 - 34.0 pg   MCHC 32.2 30.0 - 36.0 g/dL   RDW 13.9 11.5 - 15.5 %   Platelets 348 150 - 400 K/uL   nRBC 0.0 0.0 - 0.2 %     [place] RT LOWER EXTREM LTD SOFT TISSUE NON VASCULAR  Result Date: 03/23/2021 CLINICAL DATA:  Swelling of the right inner thigh. Recent placement of right femoral intramedullary nail on 03/20/2021. EXAM: ULTRASOUND RIGHT LOWER EXTREMITY LIMITED TECHNIQUE: Ultrasound examination of the lower extremity soft tissues was performed in the area of clinical concern. COMPARISON:  None. FINDINGS: Targeted ultrasound of the inner upper right thigh, at the patient indicated area of clinical concern, demonstrates marked subcutaneous edema. A vague hypoechoic area measuring approximately 3.2 x 1.7 x 2.4 cm with internal vascularity is noted at the level of the musculature. Evaluation was suboptimal secondary to patient pain and difficulty with moving the right lower extremity. IMPRESSION: Subcutaneous edema and vague hypoechoic area measuring approximately 3.2 cm at the patient indicated area of concern in the inner right thigh. Findings are nonspecific with main differential considerations including hematoma or potentially early abscess formation  in the appropriate clinical context. If there is clinical concern for active extravasation with hematoma formation, recommend CT of the right lower extremity with and without intravenous contrast. Electronically Signed   By: Ileana Roup M.D.   On: 03/23/2021 18:46   DG FEMUR PORT, MIN 2 VIEWS RIGHT  Result Date: 03/23/2021 CLINICAL DATA:  Three days status post right intramedullary nail placement. New redness and swelling of the right inner thigh. EXAM: RIGHT FEMUR PORTABLE 2 VIEW COMPARISON:  Radiograph 03/20/2021 FINDINGS: Intramedullary nail fixation of right intertrochanteric fracture. Hardware is intact and in appropriate position, unchanged compared to postoperative radiograph on 03/20/2021. Interval development of ill-defined  soft tissue density in the upper inner right thigh. No subcutaneous emphysema in this region. Resolving subcutaneous emphysema is noted in the lateral soft tissues of the right hip. IMPRESSION: 1. Interval development of ill-defined soft tissue density in the upper inner right thigh, at the described location of new redness and swelling, is concerning for possible infection versus hematoma. 2. Right intramedullary nail fixation hardware is intact without evidence of complication. Electronically Signed   By: Ileana Roup M.D.   On: 03/23/2021 19:22    Assessment/Plan: Right intertrochanteric hip fracture 4 Days Post-Op IM nail Left femur greater trochanter fracture - treating non-operatively New right medial thigh swelling and redness  Acute blood loss Anemia- Hbg trending 7.7 post op, 8.5 yesterday, 8.5 today. Will continue to watch.  - post operative x-ray and [place] as above will plan to order imaging with IV contrast to better see if this is new infection or hematoma. Clinically, does not look like hematoma. WBC count normal at 3.5 today - CT right femur w/ and w/o contrast has been ordered. Will wait on these results.   Weightbearing: WBAT BLE Insicional and dressing care: reinforce dressings as needed  VTE prophylaxis: Lovenox 40mg  qd x 30 days Pain control: continue current regimen Follow - up plan: 2 weeks with Dr. Mardelle Matte Dispo: PT recommending SNF, TOC working on placement  Contact information:   Weekdays 8-5 Merlene Pulling, PA-C [phone] A fter hours and holidays please check [email or web address] for group call information for [facility]  Ventura Bruns 03/24/2021, 9:27 AM

## 2021-03-24 NOTE — Progress Notes (Signed)
Physical Therapy Treatment Patient Details Name: Stacie [name] MRN: [id number] DOB: 03/16/1955 Today's Date: 03/24/2021   History of Present Illness Pt. is a 66 y.o. female presenting to [facility] on 2/15 for a fall onto her right hip while intoxicated which she was unable to ambulate afterwards. Dx of R hip intertrochanteric fracture.  Right hip intramedullary nail performed on 2/16 for femoral neck fracture, WBAT per op note. Pt also has Mildly displaced left greater trochanter fracture.PMH significant of alcohol abuse, diastolic CHF, HLD, HTN, depression. Has B mild weakness she reports from a previous stroke. She also reports if she doesn't drink she gets occasional shakes but has never had a seizure or withdraw symptoms.    PT Comments    Pt with eyes closed entire PT session, she was lethargic and did not respond to questions nor to commands. Pt was moaning "help" repeatedly. +2 total assist for supine to sit, total assist for sitting balance at edge of bed.  PROM to RLE performed. Pt not able to engage with PT session due to decline in cognition.   Recommendations for follow up therapy are one component of a multi-disciplinary discharge planning process, led by the attending physician.  Recommendations may be updated based on patient status, additional functional criteria and insurance authorization.  Follow Up Recommendations  Skilled nursing-short term rehab (<3 hours/day)     Assistance Recommended at Discharge    Patient can return home with the following A lot of help with walking and/or transfers;A lot of help with bathing/dressing/bathroom;Direct supervision/assist for financial management;Direct supervision/assist for medications management;Assist for transportation;Help with stairs or ramp for entrance   Equipment Recommendations  None recommended by PT    Recommendations for Other Services       Precautions / Restrictions Precautions Precautions:  Fall Restrictions Weight Bearing Restrictions: Yes RLE Weight Bearing: Weight bearing as tolerated LLE Weight Bearing: Weight bearing as tolerated     Mobility  Bed Mobility Overal bed mobility: Needs Assistance Bed Mobility: Supine to Sit, Sit to Supine     Supine to sit: Total assist, +2 for physical assistance, +2 for safety/equipment Sit to supine: Total assist, +2 for physical assistance, +2 for safety/equipment   General bed mobility comments: pt in fetal position in bed, will not wear a gown (per daughter), +2 total assist for supine to sit, total assist for sitting balance, did not respond to questions nor to commands. Sat EOB ~5 minutes. HR 113-122. Eyes closed during entire session. Pt had ativan 10 minutes prior to PT session. However, daughter stated pt has not been alert nor oriented at all today, and that this is a decline from 2 days ago.    Transfers                        Ambulation/Gait                   Stairs             Wheelchair Mobility    Modified Rankin (Stroke Patients Only)       Balance Overall balance assessment: Needs assistance Sitting-balance support: Feet unsupported, Bilateral upper extremity supported Sitting balance-Leahy Scale: Zero Sitting balance - Comments: required total assist for sitting balance                                    Cognition Arousal/Alertness: Lethargic Behavior  During Therapy: Restless Overall Cognitive Status: Impaired/Different from baseline Area of Impairment: Orientation, Attention, Memory, Following commands, Safety/judgement, Awareness                 Orientation Level: Person, Place, Time, Situation (did not respond to orientation questions)   Memory: Decreased short-term memory   Safety/Judgement: Decreased awareness of safety, Decreased awareness of deficits     General Comments: did not respond to questions nor to commands        Exercises  General Exercises - Lower Extremity Ankle Circles/Pumps: PROM, Right, 10 reps, Supine Long Arc Quad: PROM, Right, 5 reps, Seated Heel Slides: PROM, Right, 10 reps, Supine    General Comments        Pertinent Vitals/Pain Pain Assessment Pain Assessment: Faces Faces Pain Scale: Hurts little more Negative Vocalization: repeated troubled calling out, loud moaning/groaning, crying Facial Expression: sad, frightened, frown Body Language: tense, distressed pacing, fidgeting Consolability: unable to console, distract or reassure Pain Location: repeatedly asking for help, not able to specify location of pain Pain Descriptors / Indicators: Moaning Pain Intervention(s): Limited activity within patient's tolerance, Monitored during session, Repositioned    Home Living                          Prior Function            PT Goals (current goals can now be found in the care plan section) Acute Rehab PT Goals PT Goal Formulation: With patient Time For Goal Achievement: 04/04/21 Potential to Achieve Goals: Good Progress towards PT goals: Not progressing toward goals - comment (lethargy/confusion)    Frequency    Min 3X/week      PT Plan Current plan remains appropriate    Co-evaluation              AM-PAC PT "6 Clicks" Mobility   Outcome Measure  Help needed turning from your back to your side while in a flat bed without using bedrails?: Total Help needed moving from lying on your back to sitting on the side of a flat bed without using bedrails?: Total Help needed moving to and from a bed to a chair (including a wheelchair)?: Total Help needed standing up from a chair using your arms (e.g., wheelchair or bedside chair)?: Total Help needed to walk in hospital room?: Total Help needed climbing 3-5 steps with a railing? : Total 6 Click Score: 6    End of Session Equipment Utilized During Treatment: Gait belt Activity Tolerance: Patient limited by  lethargy Patient left: in bed;with call bell/phone within reach;with bed alarm set;with nursing/sitter in room;with family/visitor present Nurse Communication: Mobility status PT Visit Diagnosis: Other abnormalities of gait and mobility (R26.89);Pain Pain - Right/Left: Right Pain - part of body: Hip     Time: 8676-7209 PT Time Calculation (min) (ACUTE ONLY): 9 min  Charges:  $Therapeutic Activity: 8-22 mins                    Blondell Reveal Kistler PT 03/24/2021  Acute Rehabilitation Services Pager [phone] Office [phone]

## 2021-03-25 ENCOUNTER — Inpatient Hospital Stay (HOSPITAL_COMMUNITY): Payer: Medicare Other

## 2021-03-25 DIAGNOSIS — K529 Noninfective gastroenteritis and colitis, unspecified: Secondary | ICD-10-CM

## 2021-03-25 DIAGNOSIS — G9341 Metabolic encephalopathy: Secondary | ICD-10-CM

## 2021-03-25 DIAGNOSIS — R1310 Dysphagia, unspecified: Secondary | ICD-10-CM

## 2021-03-25 DIAGNOSIS — L03115 Cellulitis of right lower limb: Secondary | ICD-10-CM

## 2021-03-25 DIAGNOSIS — D62 Acute posthemorrhagic anemia: Secondary | ICD-10-CM

## 2021-03-25 DIAGNOSIS — B86 Scabies: Secondary | ICD-10-CM | POA: Diagnosis present

## 2021-03-25 HISTORY — DX: Dysphagia, unspecified: R13.10

## 2021-03-25 HISTORY — DX: Metabolic encephalopathy: G93.41

## 2021-03-25 LAB — CBC
HCT: 29.2 % — ABNORMAL LOW (ref 36.0–46.0)
Hemoglobin: 9.1 g/dL — ABNORMAL LOW (ref 12.0–15.0)
MCH: 26.6 pg (ref 26.0–34.0)
MCHC: 31.2 g/dL (ref 30.0–36.0)
MCV: 85.4 fL (ref 80.0–100.0)
Platelets: 343 10*3/uL (ref 150–400)
RBC: 3.42 MIL/uL — ABNORMAL LOW (ref 3.87–5.11)
RDW: 14.7 % (ref 11.5–15.5)
WBC: 12.5 10*3/uL — ABNORMAL HIGH (ref 4.0–10.5)
nRBC: 0 % (ref 0.0–0.2)

## 2021-03-25 LAB — BASIC METABOLIC PANEL
Anion gap: 9 (ref 5–15)
BUN: 11 mg/dL (ref 8–23)
CO2: 26 mmol/L (ref 22–32)
Calcium: 8.3 mg/dL — ABNORMAL LOW (ref 8.9–10.3)
Chloride: 102 mmol/L (ref 98–111)
Creatinine, Ser: 0.51 mg/dL (ref 0.44–1.00)
GFR, Estimated: >60 mL/min (ref >60)
Glucose, Bld: 85 mg/dL (ref 70–99)
Potassium: 2.6 mmol/L — CL (ref 3.5–5.1)
Sodium: 137 mmol/L (ref 135–145)

## 2021-03-25 LAB — MAGNESIUM: Magnesium: 2 mg/dL (ref 1.7–2.4)

## 2021-03-25 MED ORDER — DEXTROSE-NACL 5-0.9 % IV SOLN: INTRAVENOUS | Status: DC

## 2021-03-25 MED ORDER — SODIUM CHLORIDE 0.9 % IV SOLN
1.0000 g | INTRAVENOUS | Status: DC
  Administered 2021-03-25 – 2021-03-26 (×2): 1 g via INTRAVENOUS
  Filled 2021-03-25 (×2): qty 10

## 2021-03-25 MED ORDER — POTASSIUM CHLORIDE 20 MEQ PO PACK
40.0000 meq | PACK | Freq: Once | ORAL | Status: AC
  Administered 2021-03-25: 40 meq via ORAL
  Filled 2021-03-25 (×2): qty 2

## 2021-03-25 MED ORDER — METHOCARBAMOL 1000 MG/10ML IJ SOLN
500.0000 mg | Freq: Four times a day (QID) | INTRAVENOUS | Status: DC | PRN
  Administered 2021-03-25: 500 mg via INTRAVENOUS
  Filled 2021-03-25: qty 5
  Filled 2021-03-25: qty 500

## 2021-03-25 MED ORDER — HYDROCODONE-ACETAMINOPHEN 7.5-325 MG PO TABS
1.0000 | ORAL_TABLET | Freq: Four times a day (QID) | ORAL | Status: DC | PRN
  Administered 2021-03-25 – 2021-03-28 (×6): 1 via ORAL
  Filled 2021-03-25 (×6): qty 1

## 2021-03-25 MED ORDER — POTASSIUM CHLORIDE 10 MEQ/100ML IV SOLN
10.0000 meq | INTRAVENOUS | Status: AC
  Administered 2021-03-25 (×6): 10 meq via INTRAVENOUS
  Filled 2021-03-25 (×6): qty 100

## 2021-03-25 NOTE — Hospital Course (Addendum)
Stacie [name] is a 66 y.o. female with past medical history of hypertension, hyperlipidemia, stroke, diastolic heart failure, depression and noncompliance of medication presented to the ED after sustaining a fall while under the influence of alcohol leading to right hip pain and difficulty ambulation.  In the ED patient was hemodynamically stable.  Initial labs showed a potassium of 2.9 with WBC elevated at 11.5.  Right x-ray of the hips hip x-ray showed intertrochanteric right hip fracture with foreshortening and varus angulation as well as mildly displaced left greater trochanter fracture. CT head was unremarkable. Orthopedics was consulted from ED and patient was admitted to the hospital for further evaluation and treatment..  Patient underwent IM nailing of the right femur on 03/20/2021 but postoperatively had episodes of confusion encephalopathy and dysphagia.  This has subsequently improved at this time.

## 2021-03-25 NOTE — Progress Notes (Addendum)
Subjective: 5 Days Post-Op s/p Procedure(s): INTRAMEDULLARY (IM) NAIL FEMORAL  Follows commands to move toes and ankles. Does not answer questions, trying to pull off catheter sticker on left inner thigh. Saying "why" multiple times, but otherwise does not answer questions.    Objective:  PE: VITALS:   Vitals:   03/24/21 1830 03/24/21 2035 03/24/21 2357 03/25/21 0457  BP:  (!) 147/79 (!) 150/71 (!) 145/72  Pulse:  (!) 109 98 88  Resp:  20 20 18   Temp: 99 F (37.2 C) 98.2 F (36.8 C) 98.4 F (36.9 C) 97.6 F (36.4 C)  TempSrc: Axillary Oral Oral Oral  SpO2:  97% 97% 93%  Weight:      Height:       General: laying in bed MSK: 2+ DP and PT pulses bilaterally. Dorsiflexion and plantarflexion intact bilaterally. No dressing on distal wound, steristrips intact. No erythema around either incision. Replaced with no mepliex. Was able to better see redness and echymosis to medial right thigh as patient was laying on left side, please see photo below. Mild erythema to medial right thigh. TTP to left greater trochanter. Able to flex and extend at bilateral knees and hips.      LABS  Results for orders placed or performed during the hospital encounter of 03/19/21 (from the past 24 hour(s))  CBC     Status: Abnormal   Collection Time: 03/24/21  8:53 AM  Result Value Ref Range   WBC 8.7 4.0 - 10.5 K/uL   RBC 3.50 (L) 3.87 - 5.11 MIL/uL   Hemoglobin 9.6 (L) 12.0 - 15.0 g/dL   HCT 29.8 (L) 36.0 - 46.0 %   MCV 85.1 80.0 - 100.0 fL   MCH 27.4 26.0 - 34.0 pg   MCHC 32.2 30.0 - 36.0 g/dL   RDW 13.9 11.5 - 15.5 %   Platelets 348 150 - 400 K/uL   nRBC 0.0 0.0 - 0.2 %  Basic metabolic panel     Status: Abnormal   Collection Time: 03/24/21  8:53 AM  Result Value Ref Range   Sodium 137 135 - 145 mmol/L   Potassium 3.5 3.5 - 5.1 mmol/L   Chloride 102 98 - 111 mmol/L   CO2 28 22 - 32 mmol/L   Glucose, Bld 106 (H) 70 - 99 mg/dL   BUN 9 8 - 23 mg/dL   Creatinine, Ser 0.49 0.44 -  1.00 mg/dL   Calcium 8.5 (L) 8.9 - 10.3 mg/dL   GFR, Estimated >60 >60 mL/min   Anion gap 7 5 - 15  Basic metabolic panel     Status: Abnormal   Collection Time: 03/25/21  4:56 AM  Result Value Ref Range   Sodium 137 135 - 145 mmol/L   Potassium 2.6 (LL) 3.5 - 5.1 mmol/L   Chloride 102 98 - 111 mmol/L   CO2 26 22 - 32 mmol/L   Glucose, Bld 85 70 - 99 mg/dL   BUN 11 8 - 23 mg/dL   Creatinine, Ser 0.51 0.44 - 1.00 mg/dL   Calcium 8.3 (L) 8.9 - 10.3 mg/dL   GFR, Estimated >60 >60 mL/min   Anion gap 9 5 - 15  Magnesium     Status: None   Collection Time: 03/25/21  5:59 AM  Result Value Ref Range   Magnesium 2.0 1.7 - 2.4 mg/dL     CT FEMUR RIGHT W CONTRAST  Result Date: 03/24/2021 CLINICAL DATA:  New medial thigh swelling and redness. Proximal  right femur ORIF 4 days ago EXAM: CT OF THE LOWER RIGHT EXTREMITY WITH CONTRAST TECHNIQUE: Multidetector CT imaging of the lower right extremity was performed according to the standard protocol following intravenous contrast administration. RADIATION DOSE REDUCTION: This exam was performed according to the departmental dose-optimization program which includes automated exposure control, adjustment of the mA and/or kV according to patient size and/or use of iterative reconstruction technique. CONTRAST:  65mL OMNIPAQUE IOHEXOL 300 MG/ML  SOLN COMPARISON:  X-ray 03/23/2021 FINDINGS: Bones/Joint/Cartilage Postsurgical changes of recent ORIF of the proximal right femur with short IM rod, lag screw and distal interlocking screw. Near anatomic alignment of comminuted intertrochanteric proximal right femur fracture. Lesser trochanteric fragment is moderately displaced. Hip joint alignment is maintained without dislocation. There is a healing fracture involving the left greater trochanter with mild displacement. Ligaments Suboptimally assessed by CT. Muscles and Tendons No acute musculotendinous abnormality by CT. Soft tissue air within the right gluteal  musculature is presumed postoperative. Soft tissues Prominent edema and ill-defined fluid within the medial subcutaneous tissues of the upper right thigh (series 6, image 104). No organized or rim enhancing collection. Expected postoperative changes within the soft tissues at the anterolateral aspect of the right hip. Small amount of residual soft tissue air at this location is presumed postsurgical. No lymphadenopathy. IMPRESSION: 1. Postsurgical changes of recent ORIF of the proximal right femur. 2. Prominent edema and ill-defined fluid within the medial subcutaneous tissues of the upper right thigh, concerning for soft tissue infection/cellulitis. No organized or rim-enhancing collection. 3. Healing fracture involving the left greater trochanter with mild displacement. Electronically Signed   By: Davina Poke D.O.   On: 03/24/2021 13:07   [place] RT LOWER EXTREM LTD SOFT TISSUE NON VASCULAR  Result Date: 03/23/2021 CLINICAL DATA:  Swelling of the right inner thigh. Recent placement of right femoral intramedullary nail on 03/20/2021. EXAM: ULTRASOUND RIGHT LOWER EXTREMITY LIMITED TECHNIQUE: Ultrasound examination of the lower extremity soft tissues was performed in the area of clinical concern. COMPARISON:  None. FINDINGS: Targeted ultrasound of the inner upper right thigh, at the patient indicated area of clinical concern, demonstrates marked subcutaneous edema. A vague hypoechoic area measuring approximately 3.2 x 1.7 x 2.4 cm with internal vascularity is noted at the level of the musculature. Evaluation was suboptimal secondary to patient pain and difficulty with moving the right lower extremity. IMPRESSION: Subcutaneous edema and vague hypoechoic area measuring approximately 3.2 cm at the patient indicated area of concern in the inner right thigh. Findings are nonspecific with main differential considerations including hematoma or potentially early abscess formation in the appropriate clinical context. If  there is clinical concern for active extravasation with hematoma formation, recommend CT of the right lower extremity with and without intravenous contrast. Electronically Signed   By: Ileana Roup M.D.   On: 03/23/2021 18:46   DG FEMUR PORT, MIN 2 VIEWS RIGHT  Result Date: 03/23/2021 CLINICAL DATA:  Three days status post right intramedullary nail placement. New redness and swelling of the right inner thigh. EXAM: RIGHT FEMUR PORTABLE 2 VIEW COMPARISON:  Radiograph 03/20/2021 FINDINGS: Intramedullary nail fixation of right intertrochanteric fracture. Hardware is intact and in appropriate position, unchanged compared to postoperative radiograph on 03/20/2021. Interval development of ill-defined soft tissue density in the upper inner right thigh. No subcutaneous emphysema in this region. Resolving subcutaneous emphysema is noted in the lateral soft tissues of the right hip. IMPRESSION: 1. Interval development of ill-defined soft tissue density in the upper inner right thigh, at the described  location of new redness and swelling, is concerning for possible infection versus hematoma. 2. Right intramedullary nail fixation hardware is intact without evidence of complication. Electronically Signed   By: Ileana Roup M.D.   On: 03/23/2021 19:22    Assessment/Plan: Right intertrochanteric hip fracture 5 Days Post-Op IM nail Left femur greater trochanter fracture - treating non-operatively New right medial thigh swelling and redness  Acute blood loss Anemia- Hbg trending 7.7 post op, 8.5 yesterday, 8.5 today. Will continue to watch, no CBC back yet this morning - post operative x-ray and [place] as above will plan to order imaging with IV contrast to better see if this is new infection or hematoma. WBC count pending this morning - CT right femur looked ok.  Medial thigh with cellulitis vs.dermatitis.  No drainable fluid collection.  Some of this may be contributed by her urinary incontinence and mental status  challenges.  This does not appear to communicate to her surgical site.  Ok for antibiotics.   Weightbearing: WBAT BLE Insicional and dressing care: reinforce dressings as needed  VTE prophylaxis: Lovenox 40mg  qd x 30 days Pain control: patient has had continued decreased mentation since admission, will decrease pain medication today. Follow - up plan: 2 weeks with Dr. Mardelle Matte Dispo:  Contact information:   Weekdays 8-5 Merlene Pulling, PA-C [phone] A fter hours and holidays please check [email or web address] for group call information for [facility]  Ventura Bruns 03/25/2021, 7:21 AM   Reviewed, discussed, agree.  The clinical picture looks like the expected hematoma after surgery.  Johnny Bridge, MD

## 2021-03-25 NOTE — Assessment & Plan Note (Addendum)
On presentation.  Permethrin cream was applied x1 on 2/18.

## 2021-03-25 NOTE — Assessment & Plan Note (Addendum)
Received CIWA protocol.  Alcohol level was 111 on admission.  No signs of withdrawal at this time.

## 2021-03-25 NOTE — Assessment & Plan Note (Addendum)
History of chronic diarrhea and decreased oral intake.  Had been to [facility] in the past as per the patient's family.  Patient doesn't eat well because of this and has always been underweight as per the family.Stacie [name]

## 2021-03-25 NOTE — Assessment & Plan Note (Addendum)
Secondary to mechanical fall under the influence of alcohol.  Patient underwent right femoral intramedullary nail by orthopedic surgery on 03/20/2021.  Continue Lovenox 40 mg daily for 30 days for DVT prophylaxis.  Continue PT/ OT.  PT recommends skilled nursing facility placement on discharge.  Patient to follow-up with Dr. Fabian November orthopedic surgery in 2 weeks.

## 2021-03-25 NOTE — Progress Notes (Signed)
°   03/25/21 0551  Provider Notification  Provider Name/Title j daniels  Date Provider Notified 03/25/21  Time Provider Notified [phone]  Notification Type Page  Notification Reason Critical result  Test performed and critical result K+ 2.6  Date Critical Result Received 03/25/21  Time Critical Result Received 2863  Provider response See new orders  Date of Provider Response 03/25/21  Time of Provider Response [phone]

## 2021-03-25 NOTE — Assessment & Plan Note (Addendum)
Continue aspirin, statins.  Repeat CT head scan on 03/24/21 without acute findings.

## 2021-03-25 NOTE — Assessment & Plan Note (Addendum)
2D echocardiogram from 2021 showed LV ejection fraction of 60 to 65% with grade 1 diastolic dysfunction.  Currently compensated.

## 2021-03-25 NOTE — Progress Notes (Signed)
Speech Language Pathology Treatment: Dysphagia  Patient Details Name: Stacie [name] MRN: [id number] DOB: April 21, 1955 Today's Date: 03/25/2021 Time: 1027-2536 SLP Time Calculation (min) (ACUTE ONLY): 34 min  Assessment / Plan / Recommendation Clinical Impression  Due to subpar positioning, mentation and premorbid and current dysphagia *oral holding and occasional cough with liquids PTA*   She does desire intake and benefits from total cues to help feed herself with hand over hand assist *icecream and Ensure.  SLP provided pt with single ice chip x1 and three straw boluses of Ensure.  Oral holding continued and immediate cough noted post-swallow of thin liquid via straw.  1/2 tsp bolus did not elicit cough post-swallow.  Recommend pt have floor stock of full liquids only - Ensure ok via straw given by hospital staff, famly ok to give pt thin water via 1/2 tsp and single small ice chips when fully alert and upright.  Do not anticipate pt to achieve adequate po intake but po would allow her comfort and to maintain some swallow function.  Stacie [name] *friend* educated and agreeable to recommendations Stacie [name] had daughter (HCPOA)on the phone during session.      HPI HPI: Patient is a 66 y.o. female with PMH: ETOH abuse, diastolic CHF, HLD, HTN, depression, CVA, noncompliance with medication who presented to ED on 2/15 with fall at home while under influence of alcohol, leading to right hipo pain and inability to ambulate. In ED patient was hemodynamically stable, x-ray of right hip showed intertrochanteric hipo fracture with foreshortening and varus angulation as well as mildly displaced left greater trochanter fracture. CT head unremarkable. She underwent right femoral intramedullary nail by orthopedic surgery on 2/16. She was started on a regular solids, thin liquids diet however night shift RN reported on 2/20 that patient coughed with dose of PRN pain medication and as h/o dysphagia noted in chart,  SLP swallow evaluation ordered.      SLP Plan  Continue with current plan of care      Recommendations for follow up therapy are one component of a multi-disciplinary discharge planning process, led by the attending physician.  Recommendations may be updated based on patient status, additional functional criteria and insurance authorization.    Recommendations  Diet recommendations: Thin liquid;Nectar-thick liquid- FLOOR STOCK ONLY;  Medication Administration:   Via alternative means (if need po, with icecream); Family can give 1/2 tsp thin water or single ice chips only - all other po by hospital staff Supervision: Patient able to self feed;Staff to assist with self feeding Compensations: Slow rate;Small sips/bites Postural Changes and/or Swallow Maneuvers: Upright 30-60 min after meal;Seated upright 90 degrees                Oral Care Recommendations: Other (Comment);Staff/trained caregiver to provide oral care Follow Up Recommendations: Skilled nursing-short term rehab (<3 hours/day) Assistance recommended at discharge: Frequent or constant Supervision/Assistance SLP Visit Diagnosis: Dysphagia, unspecified (R13.10) Plan: Continue with current plan of care          Stacie Lime, MS [place] Office [phone] Pager [phone]  Stacie [name]  03/25/2021, 12:42 PM

## 2021-03-25 NOTE — Assessment & Plan Note (Addendum)
Possible mild cellulitis/dermatitis.  Received Rocephin.  Antibiotics were discontinued

## 2021-03-25 NOTE — Assessment & Plan Note (Addendum)
Significant weight loss of around 100 pounds.  Continue Ensure supplementation on discharge.

## 2021-03-25 NOTE — Assessment & Plan Note (Addendum)
Continue simvastatin. 

## 2021-03-25 NOTE — Assessment & Plan Note (Addendum)
Improved at this time.  Thought to be secondary to hospital induced delirium, postoperative status, analgesia, volume depletion on the background of baseline cognitive dysfunction.   Continue thiamine and folic acid on discharge.  Patient is afebrile.

## 2021-03-25 NOTE — Assessment & Plan Note (Addendum)
Seen by speech and therapy.  Now on regular consistency diet.

## 2021-03-25 NOTE — Assessment & Plan Note (Addendum)
Continue amlodipine hydralazine and Imdur on discharge.

## 2021-03-25 NOTE — Assessment & Plan Note (Addendum)
Resolved after replacement.  Latest potassium of 4.1

## 2021-03-25 NOTE — Assessment & Plan Note (Addendum)
Postoperative.  Latest hemoglobin of 8.7.  Hemoglobin remained stable.  Patient did not require any blood transfusion.  Patient will continue on Folic acid discharge

## 2021-03-25 NOTE — Progress Notes (Addendum)
PROGRESS NOTE    Stacie [name]  DXI:[id number] DOB: [birth year] DOA: 03/19/2021 PCP: [name], NP    Brief Narrative:  Stacie [name] is a 66 y.o. female with past medical history of hypertension, hyperlipidemia, stroke, diastolic heart failure, depression and noncompliance of medication presented to the ED after sustaining a fall while under the influence of alcohol leading to right hip pain and difficulty ambulation.  In the ED patient was hemodynamically stable.  Initial labs showed a potassium of 2.9 with WBC elevated at 11.5.  Right x-ray of the hips hip x-ray showed intertrochanteric right hip fracture with foreshortening and varus angulation as well as mildly displaced left greater trochanter fracture. CT head was unremarkable. Orthopedics was consulted from ED and patient was admitted to the hospital for further evaluation and treatment..     Assessment and Plan: * Bilateral hip fractures, closed, initial encounter ([place])- (present on admission) Secondary to mechanical fall under the influence of alcohol.  Orthopedics has been consulted and patient underwent right femoral intramedullary nail by orthopedic surgery on 03/20/2021.  Continue Lovenox 40 mg daily for 30 days for DVT prophylaxis.  Continue PT OT.  PT recommends skilled nursing facility placement on discharge.  Patient to follow-up with Dr. Fabian November orthopedic surgery in 2 weeks.  Acute metabolic encephalopathy Lethargic at the time of my evaluation.  Seen by speech therapy recommend full liquids at this time.  We will continue to monitor closely.  Continue supportive care.  Patient does have a baseline cognitive dysfunction.  Hypokalemia- (present on admission) Patient received 60 mEq of IV potassium today.  Check BMP in AM.  Alcohol abuse- (present on admission) Continue CIWA protocol.  Alcohol level was 111 on admission.    Cellulitis of right thigh Mild.  Continue antibiotics with IV Rocephin.   Was on IV vancomycin and cefepime.    Acute blood loss anemia Postoperative.  We will continue to monitor.  Transfuse for hemoglobin less than 7.  Scabies- (present on admission) On presentation.  Permethrin cream was applied x1 on 2/18.  Continue isolation progressing.  Dysphagia Seen by speech and therapy.  On full liquids at this time.  Protein-calorie malnutrition, severe- (present on admission) Significant weight loss of around 100 pounds.  Continue Ensure supplementation.  Chronic diastolic CHF (congestive heart failure) ([place])- (present on admission) 2D echocardiogram from 2021 showed LV ejection fraction of 60 to 65% with grade 1 diastolic dysfunction.  Currently compensated.  Continue intake and output charting Daily weights.  Chronic diarrhea- (present on admission) Chronic.  Had been to [facility] in the past.  Patient doesn't eat well because of this and has always been underweight.  Hyperlipidemia- (present on admission) Continue statins.  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  Continue statins.  Repeat CT head scan on 2/20 without acute findings.  Essential hypertension- (present on admission) Blood pressure elevated on presentation.  Currently on amlodipine hydralazine and Imdur.  We will continue for now.    DVT prophylaxis: enoxaparin (LOVENOX) injection 40 mg Start: 03/21/21 0800 SCDs Start: 03/20/21 1852 SCDs Start: 03/20/21 0024   Code Status:     Code Status: DNR  Disposition: Unknown at this time.  Status is: Inpatient  Remains inpatient appropriate because: Metabolic encephalopathy, status post surgery, need for rehabilitation placement.   Family Communication: I spoke with the patient's daughter on the phone and updated her about the clinical condition of the patient.  Consultants:  Orthopedics  Procedures:  Right  femoral IM nailing  Antimicrobials:  Rocephin IV   Subjective: Today, patient was seen and  examined at bedside.  Patient appears to be lethargic responding with deep painful stimulus.  Objective: Vitals:   03/25/21 0457 03/25/21 0838 03/25/21 1240 03/25/21 1258  BP: (!) 145/72 134/78 136/79 (!) 147/72  Pulse: 88 78 85 89  Resp: 18   18  Temp: 97.6 F (36.4 C)   98.4 F (36.9 C)  TempSrc: Oral   Oral  SpO2: 93%   97%  Weight:      Height:        Intake/Output Summary (Last 24 hours) at 03/25/2021 1417 Last data filed at 03/25/2021 1336 Gross per 24 hour  Intake 1078.47 ml  Output 1350 ml  Net -271.53 ml   Filed Weights   03/20/21 1529 03/23/21 0500  Weight: 43.1 kg 42.5 kg    Physical Examination:  General: Thinly built, not in obvious distress, cachectic, on mittens HENT:   No scleral pallor or icterus noted. Oral mucosa is moist.  Chest:   Diminished breath sounds bilaterally. CVS: S1 &S2 heard. No murmur.  Regular rate and rhythm. Abdomen: Soft, nontender, nondistended.  Bowel sounds are heard.   Extremities: No cyanosis, clubbing or edema.  Peripheral pulses are palpable.  Right hip  IM nailing with dressing. Psych: Lethargic, responds to deep stimuli, moaning. CNS: Lethargic, moving extremities, Skin: Warm and dry.  Scabies in the skin, right thigh with cellulitis,  Data Reviewed:   CBC: Recent Labs  Lab 03/19/21 2241 03/20/21 0045 03/21/21 0447 03/23/21 0634 03/24/21 0853 03/25/21 1014  WBC 11.5* 14.2* 12.9* 8.8 8.7 12.5*  NEUTROABS 9.2*  --  10.3* 6.7  --   --   HGB 10.2* 10.3* 10.4* 8.2* 9.6* 9.1*  HCT 31.5* 32.2* 32.0* 26.2* 29.8* 29.2*  MCV 83.6 83.4 82.9 85.3 85.1 85.4  PLT 408* 418* 404* 291 348 400    Basic Metabolic Panel: Recent Labs  Lab 03/19/21 2241 03/20/21 0045 03/21/21 0447 03/23/21 0634 03/24/21 0853 03/25/21 0456 03/25/21 0559  NA 134* 133* 131* 138 137 137  --   K 2.9* 2.8* 3.2* 3.5 3.5 2.6*  --   CL 100 100 98 104 102 102  --   CO2 23 23 23 25 28 26   --   GLUCOSE 97 131* 122* 109* 106* 85  --   BUN 14 13 16 11  9 11   --   CREATININE 0.54 0.39* 0.66 0.42* 0.49 0.51  --   CALCIUM 8.2* 8.3* 7.7* 8.5* 8.5* 8.3*  --   MG 1.9  --  1.8  --   --   --  2.0  PHOS 3.0  --   --   --   --   --   --     Liver Function Tests: Recent Labs  Lab 03/19/21 2241  AST 23  ALT 20  ALKPHOS 59  BILITOT 0.2*  PROT 6.6  ALBUMIN 3.8     Radiology Studies: CT HEAD WO CONTRAST (5MM)  Result Date: 03/25/2021 CLINICAL DATA:  Acute metabolic encephalopathy. EXAM: CT HEAD WITHOUT CONTRAST TECHNIQUE: Contiguous axial images were obtained from the base of the skull through the vertex without intravenous contrast. RADIATION DOSE REDUCTION: This exam was performed according to the departmental dose-optimization program which includes automated exposure control, adjustment of the mA and/or kV according to patient size and/or use of iterative reconstruction technique. COMPARISON:  Head CT 03/19/2021 FINDINGS: Brain: Stable age advanced cerebral atrophy, mild ventriculomegaly  and moderate periventricular white matter disease. Remote basal ganglia/periventricular white matter infarcts. No CT findings for acute hemispheric infarction or intracranial hemorrhage. No mass lesions. The brainstem and cerebellum are grossly normal in stable. Vascular: Stable age advanced vascular calcifications but no aneurysm or hyperdense vessels. Skull: No skull fracture or bone lesions. Sinuses/Orbits: The paranasal sinuses and mastoid air cells are clear. The globes are intact. Other: No scalp lesions or scalp hematoma. IMPRESSION: 1. Stable age advanced cerebral atrophy, mild ventriculomegaly and moderate periventricular white matter disease. 2. Remote basal ganglia/periventricular white matter infarcts. 3. No acute intracranial findings or mass lesions. Electronically Signed   By: Marijo Sanes M.D.   On: 03/25/2021 08:34   CT FEMUR RIGHT W CONTRAST  Result Date: 03/24/2021 CLINICAL DATA:  New medial thigh swelling and redness. Proximal right femur ORIF  4 days ago EXAM: CT OF THE LOWER RIGHT EXTREMITY WITH CONTRAST TECHNIQUE: Multidetector CT imaging of the lower right extremity was performed according to the standard protocol following intravenous contrast administration. RADIATION DOSE REDUCTION: This exam was performed according to the departmental dose-optimization program which includes automated exposure control, adjustment of the mA and/or kV according to patient size and/or use of iterative reconstruction technique. CONTRAST:  80mL OMNIPAQUE IOHEXOL 300 MG/ML  SOLN COMPARISON:  X-ray 03/23/2021 FINDINGS: Bones/Joint/Cartilage Postsurgical changes of recent ORIF of the proximal right femur with short IM rod, lag screw and distal interlocking screw. Near anatomic alignment of comminuted intertrochanteric proximal right femur fracture. Lesser trochanteric fragment is moderately displaced. Hip joint alignment is maintained without dislocation. There is a healing fracture involving the left greater trochanter with mild displacement. Ligaments Suboptimally assessed by CT. Muscles and Tendons No acute musculotendinous abnormality by CT. Soft tissue air within the right gluteal musculature is presumed postoperative. Soft tissues Prominent edema and ill-defined fluid within the medial subcutaneous tissues of the upper right thigh (series 6, image 104). No organized or rim enhancing collection. Expected postoperative changes within the soft tissues at the anterolateral aspect of the right hip. Small amount of residual soft tissue air at this location is presumed postsurgical. No lymphadenopathy. IMPRESSION: 1. Postsurgical changes of recent ORIF of the proximal right femur. 2. Prominent edema and ill-defined fluid within the medial subcutaneous tissues of the upper right thigh, concerning for soft tissue infection/cellulitis. No organized or rim-enhancing collection. 3. Healing fracture involving the left greater trochanter with mild displacement. Electronically  Signed   By: Davina Poke D.O.   On: 03/24/2021 13:07   [place] RT LOWER EXTREM LTD SOFT TISSUE NON VASCULAR  Result Date: 03/23/2021 CLINICAL DATA:  Swelling of the right inner thigh. Recent placement of right femoral intramedullary nail on 03/20/2021. EXAM: ULTRASOUND RIGHT LOWER EXTREMITY LIMITED TECHNIQUE: Ultrasound examination of the lower extremity soft tissues was performed in the area of clinical concern. COMPARISON:  None. FINDINGS: Targeted ultrasound of the inner upper right thigh, at the patient indicated area of clinical concern, demonstrates marked subcutaneous edema. A vague hypoechoic area measuring approximately 3.2 x 1.7 x 2.4 cm with internal vascularity is noted at the level of the musculature. Evaluation was suboptimal secondary to patient pain and difficulty with moving the right lower extremity. IMPRESSION: Subcutaneous edema and vague hypoechoic area measuring approximately 3.2 cm at the patient indicated area of concern in the inner right thigh. Findings are nonspecific with main differential considerations including hematoma or potentially early abscess formation in the appropriate clinical context. If there is clinical concern for active extravasation with hematoma formation, recommend CT of  the right lower extremity with and without intravenous contrast. Electronically Signed   By: Ileana Roup M.D.   On: 03/23/2021 18:46   DG FEMUR PORT, MIN 2 VIEWS RIGHT  Result Date: 03/23/2021 CLINICAL DATA:  Three days status post right intramedullary nail placement. New redness and swelling of the right inner thigh. EXAM: RIGHT FEMUR PORTABLE 2 VIEW COMPARISON:  Radiograph 03/20/2021 FINDINGS: Intramedullary nail fixation of right intertrochanteric fracture. Hardware is intact and in appropriate position, unchanged compared to postoperative radiograph on 03/20/2021. Interval development of ill-defined soft tissue density in the upper inner right thigh. No subcutaneous emphysema in this  region. Resolving subcutaneous emphysema is noted in the lateral soft tissues of the right hip. IMPRESSION: 1. Interval development of ill-defined soft tissue density in the upper inner right thigh, at the described location of new redness and swelling, is concerning for possible infection versus hematoma. 2. Right intramedullary nail fixation hardware is intact without evidence of complication. Electronically Signed   By: Ileana Roup M.D.   On: 03/23/2021 19:22      LOS: 6 days    Flora Lipps, MD [facility] 03/25/2021, 2:17 PM

## 2021-03-25 NOTE — TOC Progression Note (Signed)
Transition of Care [facility]) - Progression Note    Patient Details  Name: Stacie [name] MRN: [id number] Date of Birth: 02/21/1955  Transition of Care [facility]) CM/SW Contact  Raquel Sayres, Juliann Pulse, RN Phone Number: 03/25/2021, 10:06 AM  Clinical Narrative: Awaiting choice from dtr Megan on Bed choice from [facility] [place] prior starting [place].      Expected Discharge Plan: Skilled Nursing Facility Barriers to Discharge: Continued Medical Work up  Expected Discharge Plan and Services Expected Discharge Plan: [place]                                               Social Determinants of Health (SDOH) Interventions    Readmission Risk Interventions Readmission Risk Prevention Plan 03/21/2021  Transportation Screening Complete  PCP or Specialist Appt within 3-5 Days Complete  HRI or [place] Complete  Social Work Consult for [place] Planning/Counseling Complete  Palliative Care Screening Complete  Medication Review [employer]) Complete  Some recent data might be hidden

## 2021-03-25 NOTE — TOC Progression Note (Signed)
Transition of Care [facility]) - Progression Note    Patient Details  Name: Jayce Boyko MRN: [id number] Date of Birth: Nov 12, 1955  Transition of Care [facility]) CM/SW Contact  Adilen Pavelko, Juliann Pulse, RN Phone Number: 03/25/2021, 3:12 PM  Clinical Narrative: Jonathon Jordan initiated [id number] Josem Kaufmann.      Expected Discharge Plan: Skilled Nursing Facility Barriers to Discharge: Insurance Authorization  Expected Discharge Plan and Services Expected Discharge Plan: [place]                                               Social Determinants of Health (SDOH) Interventions    Readmission Risk Interventions Readmission Risk Prevention Plan 03/21/2021  Transportation Screening Complete  PCP or Specialist Appt within 3-5 Days Complete  HRI or [place] Complete  Social Work Consult for [place] Planning/Counseling Complete  Palliative Care Screening Complete  Medication Review [employer]) Complete  Some recent data might be hidden

## 2021-03-26 LAB — CBC
HCT: 24.8 % — ABNORMAL LOW (ref 36.0–46.0)
Hemoglobin: 8.1 g/dL — ABNORMAL LOW (ref 12.0–15.0)
MCH: 27.6 pg (ref 26.0–34.0)
MCHC: 32.7 g/dL (ref 30.0–36.0)
MCV: 84.6 fL (ref 80.0–100.0)
Platelets: 339 10*3/uL (ref 150–400)
RBC: 2.93 MIL/uL — ABNORMAL LOW (ref 3.87–5.11)
RDW: 14.8 % (ref 11.5–15.5)
WBC: 12.4 10*3/uL — ABNORMAL HIGH (ref 4.0–10.5)
nRBC: 0 % (ref 0.0–0.2)

## 2021-03-26 LAB — BASIC METABOLIC PANEL
Anion gap: 7 (ref 5–15)
BUN: 10 mg/dL (ref 8–23)
CO2: 24 mmol/L (ref 22–32)
Calcium: 8.2 mg/dL — ABNORMAL LOW (ref 8.9–10.3)
Chloride: 107 mmol/L (ref 98–111)
Creatinine, Ser: 0.44 mg/dL (ref 0.44–1.00)
GFR, Estimated: >60 mL/min (ref >60)
Glucose, Bld: 121 mg/dL — ABNORMAL HIGH (ref 70–99)
Potassium: 3.5 mmol/L (ref 3.5–5.1)
Sodium: 138 mmol/L (ref 135–145)

## 2021-03-26 LAB — MAGNESIUM: Magnesium: 1.9 mg/dL (ref 1.7–2.4)

## 2021-03-26 MED ORDER — ACETAMINOPHEN 500 MG PO TABS
1000.0000 mg | ORAL_TABLET | Freq: Once | ORAL | Status: AC
  Administered 2021-03-26: 1000 mg via ORAL
  Filled 2021-03-26: qty 2

## 2021-03-26 MED ORDER — NYSTATIN 100000 UNIT/ML MT SUSP
5.0000 mL | Freq: Four times a day (QID) | OROMUCOSAL | Status: AC
  Administered 2021-03-26 – 2021-03-27 (×2): 500000 [IU] via ORAL
  Filled 2021-03-26 (×2): qty 5

## 2021-03-26 MED ORDER — DEXTROSE-NACL 5-0.9 % IV SOLN: INTRAVENOUS | Status: AC

## 2021-03-26 MED ORDER — ACETAMINOPHEN 500 MG PO TABS: 1000.0000 mg | ORAL_TABLET | Freq: Once | ORAL | Status: DC

## 2021-03-26 MED ORDER — POTASSIUM CHLORIDE 20 MEQ PO PACK
40.0000 meq | PACK | Freq: Once | ORAL | Status: AC
  Administered 2021-03-26: 40 meq via ORAL
  Filled 2021-03-26: qty 2

## 2021-03-26 MED ORDER — POTASSIUM CHLORIDE 20 MEQ PO PACK: 40.0000 meq | PACK | Freq: Two times a day (BID) | ORAL | Status: DC

## 2021-03-26 NOTE — Progress Notes (Signed)
Physical Therapy Treatment Patient Details Name: Stacie [name] MRN: [id number] DOB: Jan 05, 1956 Today's Date: 03/26/2021   History of Present Illness Pt. is a 66 y.o. female presenting to [facility] on 2/15 for a fall onto her right hip while intoxicated which she was unable to ambulate afterwards. Dx of R hip intertrochanteric fracture.  Right hip intramedullary nail performed on 2/16 for femoral neck fracture, WBAT per op note. Pt also has Mildly displaced left greater trochanter fracture.PMH significant of alcohol abuse, diastolic CHF, HLD, HTN, depression. Has B mild weakness she reports from a previous stroke. She also reports if she doesn't drink she gets occasional shakes but has never had a seizure or withdraw symptoms.    PT Comments    Pt slightly more alert today, in bil mittens with bedrails up. Pt arouses with multi-modal stimuli, follows some one step commands with incr time. Able to sit EOB with mod assist to --> close supervision with incr time and cues for midline, opens eyes for brief periods. Pt is overall more participatory than last PT session, although remains limited d/t lethargy/cognition.  Continue to recommend SNF post acute   Recommendations for follow up therapy are one component of a multi-disciplinary discharge planning process, led by the attending physician.  Recommendations may be updated based on patient status, additional functional criteria and insurance authorization.  Follow Up Recommendations  Skilled nursing-short term rehab (<3 hours/day)     Assistance Recommended at Discharge Frequent or constant Supervision/Assistance  Patient can return home with the following A lot of help with walking and/or transfers;A lot of help with bathing/dressing/bathroom;Direct supervision/assist for financial management;Direct supervision/assist for medications management;Assist for transportation;Help with stairs or ramp for entrance   Equipment Recommendations  None  recommended by PT    Recommendations for Other Services       Precautions / Restrictions Precautions Precautions: Fall Restrictions Weight Bearing Restrictions: Yes RLE Weight Bearing: Weight bearing as tolerated LLE Weight Bearing: Weight bearing as tolerated     Mobility  Bed Mobility Overal bed mobility: Needs Assistance Bed Mobility: Supine to Sit, Sit to Supine     Supine to sit: Total assist Sit to supine: Total assist   General bed mobility comments: total assist to come to sit, assist for LEs and trunk to upright, assist to control descent  and lift LEs on to bed. sat EOB or ~ 5 minutes, with varying levels of assist, able to drink water EOB requiring assist to hold cup    Transfers                   General transfer comment: deferred attempts with +1 assist and pt lethargic    Ambulation/Gait                   Stairs             Wheelchair Mobility    Modified Rankin (Stroke Patients Only)       Balance Overall balance assessment: Needs assistance Sitting-balance support: No upper extremity supported, Feet supported Sitting balance-Leahy Scale: Poor Sitting balance - Comments: poor to fair, with cues and incr time pt able to maintain midline EOB with close supervision Postural control: Left lateral lean                                  Cognition Arousal/Alertness: Lethargic Behavior During Therapy: Restless Overall Cognitive Status: Impaired/Different from baseline Area  of Impairment: Orientation, Attention, Memory, Following commands, Safety/judgement, Awareness                 Orientation Level: Time, Situation Current Attention Level: Focused   Following Commands: Follows one step commands inconsistently Safety/Judgement: Decreased awareness of safety, Decreased awareness of deficits     General Comments: pt states "hospital", but insists she is at [facility]  opens eyes intermittently with multi-modal  stimuli, repsonds verbally to some questions with incr time; sleepy but arousable        Exercises General Exercises - Lower Extremity Long Arc Quad:  (3 reps bil, AROM on EOB)    General Comments        Pertinent Vitals/Pain Pain Assessment Pain Assessment: Faces Faces Pain Scale: Hurts a little bit Pain Location: pt unable to specify Pain Descriptors / Indicators: Moaning, Grimacing Pain Intervention(s): Limited activity within patient's tolerance, Monitored during session, Repositioned    Home Living                          Prior Function            PT Goals (current goals can now be found in the care plan section) Acute Rehab PT Goals PT Goal Formulation: Patient unable to participate in goal setting Time For Goal Achievement: 04/04/21 Potential to Achieve Goals: Good Progress towards PT goals: Progressing toward goals (slowly)    Frequency    Min 3X/week      PT Plan Current plan remains appropriate    Co-evaluation              AM-PAC PT "6 Clicks" Mobility   Outcome Measure  Help needed turning from your back to your side while in a flat bed without using bedrails?: Total Help needed moving from lying on your back to sitting on the side of a flat bed without using bedrails?: Total Help needed moving to and from a bed to a chair (including a wheelchair)?: Total Help needed standing up from a chair using your arms (e.g., wheelchair or bedside chair)?: Total Help needed to walk in hospital room?: Total Help needed climbing 3-5 steps with a railing? : Total 6 Click Score: 6    End of Session   Activity Tolerance: Patient limited by lethargy Patient left: in bed;with call bell/phone within reach;with bed alarm set   PT Visit Diagnosis: Other abnormalities of gait and mobility (R26.89);Muscle weakness (generalized) (M62.81)     Time: 0321-2248 PT Time Calculation (min) (ACUTE ONLY): 13 min  Charges:  $Therapeutic Activity: 8-22  mins                     Baxter Flattery, PT  Acute Rehab Dept ([place]) [phone] Pager [phone]  03/26/2021    [facility] 03/26/2021, 12:20 PM

## 2021-03-26 NOTE — Progress Notes (Signed)
Speech Language Pathology Treatment: Dysphagia  Patient Details Name: Stacie [name] MRN: [id number] DOB: 06-08-1955 Today's Date: 03/26/2021 Time: 0814-4818 SLP Time Calculation (min) (ACUTE ONLY): 25 min  Assessment / Plan / Recommendation Clinical Impression  Pt seen today to address dysphagia goals - including adequacy of swallow function to obtain nutrition.  At this time, pt awake upon arrival to bed.  She does not follow directions for po trials nor answers SLP questions.  Pt did open her mouth to accept po intake of 3 bites of icecream, 3 sips of Ensure, and 2 ice chips over approximately 25 minute session.   Prolonged *presumed oral holding up to 25 seconds* - ? also pharyngeal swallow delay = continues despite total cues to swallow using verbal cues/tactile cues including counting to improve initiation.  Swallow initiation noted from approximately 7 seconds to 25 seconds and multiple swallows observed.  No overt indication of aspiration however pt remains high aspiration and malnutrition risk due to AMS, with suspected significant exacerbation of baseline dysphagia.    Unfortunately due to lack of progress, uncertain if pt swallow will recover to functional level during this hospital coarse.  Recommend continue Ensure and medication from staff, family ok to give ice chips and 1/2 tsps of water.   Of note, pt did smile and nearly chuckle when SLP sang a current humorous song - but then turned away quickly- thus also ? variable motivation.    Discussed with pt concern for level of dysphagia and nutrition.  SLP touched base with RN and palliative team in person regarding pt's current dysphagia and requested via secure chat Md consider ordering palliative consult to establish [place] given pt's ongoing dysphagia/AMS.   Will continue efforts.    HPI HPI: Patient is a 66 y.o. female with PMH: ETOH abuse, diastolic CHF, HLD, HTN, depression, CVA, noncompliance with medication who presented to  ED on 2/15 with fall at home while under influence of alcohol, leading to right hipo pain and inability to ambulate. In ED patient was hemodynamically stable, x-ray of right hip showed intertrochanteric hipo fracture with foreshortening and varus angulation as well as mildly displaced left greater trochanter fracture. CT head unremarkable. She underwent right femoral intramedullary nail by orthopedic surgery on 2/16. She was started on a regular solids, thin liquids diet however night shift RN reported on 2/20 that patient coughed with dose of PRN pain medication and as h/o dysphagia noted in chart, SLP swallow evaluation ordered.      SLP Plan  Continue with current plan of care      Recommendations for follow up therapy are one component of a multi-disciplinary discharge planning process, led by the attending physician.  Recommendations may be updated based on patient status, additional functional criteria and insurance authorization.    Recommendations  Diet recommendations: Nectar-thick liquid;Thin liquid Liquids provided via: Straw Medication Administration: Via alternative means (crush with icecream or applesauce) Supervision: Patient able to self feed;Staff to assist with self feeding Compensations: Slow rate;Small sips/bites Postural Changes and/or Swallow Maneuvers: Upright 30-60 min after meal;Seated upright 90 degrees                Oral Care Recommendations: Other (Comment);Staff/trained caregiver to provide oral care Follow Up Recommendations: Skilled nursing-short term rehab (<3 hours/day) Assistance recommended at discharge: Frequent or constant Supervision/Assistance SLP Visit Diagnosis: Dysphagia, unspecified (R13.10) Plan: Continue with current plan of care          Stacie K, MS [facility] SLP Acute Rehab Services  Office [phone] Pager [phone]  Macario Golds  03/26/2021, 3:48 PM

## 2021-03-26 NOTE — Progress Notes (Addendum)
PROGRESS NOTE    Stacie [name]  UVO:[id number] DOB: 1956/01/30 DOA: 03/19/2021 PCP: [name], NP    Brief Narrative:  Stacie [name] is a 66 y.o. female with past medical history of hypertension, hyperlipidemia, stroke, diastolic heart failure, depression and noncompliance of medication presented to the ED after sustaining a fall while under the influence of alcohol leading to right hip pain and difficulty ambulation.  In the ED patient was hemodynamically stable.  Initial labs showed a potassium of 2.9 with WBC elevated at 11.5.  Right x-ray of the hips hip x-ray showed intertrochanteric right hip fracture with foreshortening and varus angulation as well as mildly displaced left greater trochanter fracture. CT head was unremarkable. Orthopedics was consulted from ED and patient was admitted to the hospital for further evaluation and treatment..     Assessment and Plan: * Bilateral hip fractures, closed, initial encounter ([place])- (present on admission) Secondary to mechanical fall under the influence of alcohol.  Patient underwent right femoral intramedullary nail by orthopedic surgery on 03/20/2021.  Continue Lovenox 40 mg daily for 30 days for DVT prophylaxis.  Continue PT/ OT.  PT recommends skilled nursing facility placement on discharge.  Patient to follow-up with Dr. Fabian November orthopedic surgery in 2 weeks.  Acute metabolic encephalopathy Little more alert awake and communicative.  Could be multifactorial from hospital induced delirium, postoperative status, analgesia, volume depletion on the background of baseline cognitive dysfunction.  Continue supportive care.  Continue thiamine and folic acid.  Hypokalemia- (present on admission) Patient received 60 mEq of IV potassium on 03/25/2021.  Potassium is improved to 3.5 today.  We will continue with oral supplements if possible  Alcohol abuse- (present on admission) Continue CIWA protocol.  Alcohol level was 111  on admission.  Avoid oversedation.  Cellulitis of right thigh Possible mild cellulitis.  On Rocephin.  Looks more of a bruise to me today.  We will discontinue IV antibiotic by a.m. if continues to improve.  Acute blood loss anemia Postoperative.  Latest hemoglobin of 9.1.  We will continue to monitor.  Transfuse for hemoglobin less than 7.  Scabies- (present on admission) On presentation.  Permethrin cream was applied x1 on 2/18.  Continue isolation   Dysphagia Seen by speech and therapy.  Continue aspiration precautions.  Speech recommending full liquids p.o. medications with ice cream and thin sips of water.  Protein-calorie malnutrition, severe- (present on admission) Significant weight loss of around 100 pounds.  Continue Ensure supplementation.  Chronic diastolic CHF (congestive heart failure) ([place])- (present on admission) 2D echocardiogram from 2021 showed LV ejection fraction of 60 to 65% with grade 1 diastolic dysfunction.  Currently compensated.  Continue intake and output charting, Daily weights.  Avoid diuretics for now.  On D5 normal saline due to poor oral intake.  Reassess volume status in AM.  Decrease to 75 mill per hour for 1 more day  Chronic diarrhea- (present on admission) Chronic.  Had been to MD in the past.  Patient doesn't eat well because of this and has always been underweight as per the family..  Hyperlipidemia- (present on admission) Continue simvastatin.  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  Continue statins.  Repeat CT head scan on 03/24/21 without acute findings.  Essential hypertension- (present on admission) Continue amlodipine hydralazine and Imdur as able orally..  We will continue for now.    DVT prophylaxis: enoxaparin (LOVENOX) injection 40 mg Start: 03/21/21 0800 SCDs Start: 03/20/21 1852 SCDs Start: 03/20/21  0024   Code Status:     Code Status: DNR  Disposition: Skilled nursing facility  Status  is: Inpatient  Remains inpatient appropriate because: Metabolic encephalopathy, status post surgery, need for rehabilitation placement.   Family Communication:  I spoke with the patient's daughter on the phone on 03/25/2021 Consultants:  Orthopedics  Procedures:  Right femoral IM nailing on 03/20/2021  Antimicrobials:  Rocephin IV   Subjective: Today, patient was seen and examined at bedside.  Patient is more alert awake and answering few questions.  Confused and disoriented complains of pain all over.    Objective: Vitals:   03/25/21 1258 03/25/21 2105 03/25/21 2302 03/26/21 0427  BP: (!) 147/72 (!) 152/64 137/65 (!) 155/92  Pulse: 89 69 75 68  Resp: 18 18 20 18   Temp: 98.4 F (36.9 C) 98.5 F (36.9 C) 97.7 F (36.5 C) 98 F (36.7 C)  TempSrc: Oral Oral Oral Oral  SpO2: 97% 96% 94% 97%  Weight:      Height:        Intake/Output Summary (Last 24 hours) at 03/26/2021 1306 Last data filed at 03/26/2021 0600 Gross per 24 hour  Intake 2496.18 ml  Output 950 ml  Net 1546.18 ml   Filed Weights   03/20/21 1529 03/23/21 0500  Weight: 43.1 kg 42.5 kg    Physical Examination:  General: Thinly built, not in obvious distress on mittens, HENT:   No scleral pallor or icterus noted. Oral mucosa is dry, closing eyes Chest:    Diminished breath sounds bilaterally. No crackles or wheezes.  CVS: S1 &S2 heard. No murmur.  Regular rate and rhythm. Abdomen: Soft, nontender, nondistended.  Bowel sounds are heard.   Extremities: No cyanosis, clubbing or edema.  Peripheral pulses are palpable. Psych: Alert, awake and communicative, closes eyes, following commands, mildly improved, confused and disoriented CNS:  No cranial nerve deficits.  Moving extremities. Skin: Warm and dry.  Right thigh with area of mild cellulitis/bruise.  Scabies.  Data Reviewed:   CBC: Recent Labs  Lab 03/19/21 2241 03/20/21 0045 03/21/21 0447 03/23/21 0634 03/24/21 0853 03/25/21 1014  WBC 11.5* 14.2*  12.9* 8.8 8.7 12.5*  NEUTROABS 9.2*  --  10.3* 6.7  --   --   HGB 10.2* 10.3* 10.4* 8.2* 9.6* 9.1*  HCT 31.5* 32.2* 32.0* 26.2* 29.8* 29.2*  MCV 83.6 83.4 82.9 85.3 85.1 85.4  PLT 408* 418* 404* 291 348 824    Basic Metabolic Panel: Recent Labs  Lab 03/19/21 2241 03/20/21 0045 03/21/21 0447 03/23/21 0634 03/24/21 0853 03/25/21 0456 03/25/21 0559 03/26/21 0506  NA 134*   < > 131* 138 137 137  --  138  K 2.9*   < > 3.2* 3.5 3.5 2.6*  --  3.5  CL 100   < > 98 104 102 102  --  107  CO2 23   < > 23 25 28 26   --  24  GLUCOSE 97   < > 122* 109* 106* 85  --  121*  BUN 14   < > 16 11 9 11   --  10  CREATININE 0.54   < > 0.66 0.42* 0.49 0.51  --  0.44  CALCIUM 8.2*   < > 7.7* 8.5* 8.5* 8.3*  --  8.2*  MG 1.9  --  1.8  --   --   --  2.0 1.9  PHOS 3.0  --   --   --   --   --   --   --    < > =  values in this interval not displayed.    Liver Function Tests: Recent Labs  Lab 03/19/21 2241  AST 23  ALT 20  ALKPHOS 59  BILITOT 0.2*  PROT 6.6  ALBUMIN 3.8     Radiology Studies: CT HEAD WO CONTRAST (5MM)  Result Date: 03/25/2021 CLINICAL DATA:  Acute metabolic encephalopathy. EXAM: CT HEAD WITHOUT CONTRAST TECHNIQUE: Contiguous axial images were obtained from the base of the skull through the vertex without intravenous contrast. RADIATION DOSE REDUCTION: This exam was performed according to the departmental dose-optimization program which includes automated exposure control, adjustment of the mA and/or kV according to patient size and/or use of iterative reconstruction technique. COMPARISON:  Head CT 03/19/2021 FINDINGS: Brain: Stable age advanced cerebral atrophy, mild ventriculomegaly and moderate periventricular white matter disease. Remote basal ganglia/periventricular white matter infarcts. No CT findings for acute hemispheric infarction or intracranial hemorrhage. No mass lesions. The brainstem and cerebellum are grossly normal in stable. Vascular: Stable age advanced vascular  calcifications but no aneurysm or hyperdense vessels. Skull: No skull fracture or bone lesions. Sinuses/Orbits: The paranasal sinuses and mastoid air cells are clear. The globes are intact. Other: No scalp lesions or scalp hematoma. IMPRESSION: 1. Stable age advanced cerebral atrophy, mild ventriculomegaly and moderate periventricular white matter disease. 2. Remote basal ganglia/periventricular white matter infarcts. 3. No acute intracranial findings or mass lesions. Electronically Signed   By: Marijo Sanes M.D.   On: 03/25/2021 08:34      LOS: 7 days    Flora Lipps, MD [facility] 03/26/2021, 1:06 PM

## 2021-03-26 NOTE — Progress Notes (Signed)
Subjective: 6 Days Post-Op s/p Procedure(s): INTRAMEDULLARY (IM) NAIL FEMORAL  More awake this morning. "Help me" and attempting to get mittens off. Able to move all extremities and follow directions this morning. When asked where she hurts she states "everywhere". Denies CP, SOB, abdominal pain this morning.    Objective:  PE: VITALS:   Vitals:   03/25/21 1258 03/25/21 2105 03/25/21 2302 03/26/21 0427  BP: (!) 147/72 (!) 152/64 137/65 (!) 155/92  Pulse: 89 69 75 68  Resp: 18 18 20 18   Temp: 98.4 F (36.9 C) 98.5 F (36.9 C) 97.7 F (36.5 C) 98 F (36.7 C)  TempSrc: Oral Oral Oral Oral  SpO2: 97% 96% 94% 97%  Weight:      Height:       General: laying in bed MSK: 2+ DP and PT pulses bilaterally. Dorsiflexion and plantarflexion intact bilaterally. Steri-strips intact but all dressings have been taken off No erythema around either incision. Replaced with new mepliex. Mild erythema to medial right thigh without change from yesterday. TTP to left greater trochanter. Able to flex and extend at bilateral knees and hips.    LABS  Results for orders placed or performed during the hospital encounter of 03/19/21 (from the past 24 hour(s))  CBC     Status: Abnormal   Collection Time: 03/25/21 10:14 AM  Result Value Ref Range   WBC 12.5 (H) 4.0 - 10.5 K/uL   RBC 3.42 (L) 3.87 - 5.11 MIL/uL   Hemoglobin 9.1 (L) 12.0 - 15.0 g/dL   HCT 29.2 (L) 36.0 - 46.0 %   MCV 85.4 80.0 - 100.0 fL   MCH 26.6 26.0 - 34.0 pg   MCHC 31.2 30.0 - 36.0 g/dL   RDW 14.7 11.5 - 15.5 %   Platelets 343 150 - 400 K/uL   nRBC 0.0 0.0 - 0.2 %  Basic metabolic panel     Status: Abnormal   Collection Time: 03/26/21  5:06 AM  Result Value Ref Range   Sodium 138 135 - 145 mmol/L   Potassium 3.5 3.5 - 5.1 mmol/L   Chloride 107 98 - 111 mmol/L   CO2 24 22 - 32 mmol/L   Glucose, Bld 121 (H) 70 - 99 mg/dL   BUN 10 8 - 23 mg/dL   Creatinine, Ser 0.44 0.44 - 1.00 mg/dL   Calcium 8.2 (L) 8.9 - 10.3 mg/dL    GFR, Estimated >60 >60 mL/min   Anion gap 7 5 - 15  Magnesium     Status: None   Collection Time: 03/26/21  5:06 AM  Result Value Ref Range   Magnesium 1.9 1.7 - 2.4 mg/dL     CT HEAD WO CONTRAST (5MM)  Result Date: 03/25/2021 CLINICAL DATA:  Acute metabolic encephalopathy. EXAM: CT HEAD WITHOUT CONTRAST TECHNIQUE: Contiguous axial images were obtained from the base of the skull through the vertex without intravenous contrast. RADIATION DOSE REDUCTION: This exam was performed according to the departmental dose-optimization program which includes automated exposure control, adjustment of the mA and/or kV according to patient size and/or use of iterative reconstruction technique. COMPARISON:  Head CT 03/19/2021 FINDINGS: Brain: Stable age advanced cerebral atrophy, mild ventriculomegaly and moderate periventricular white matter disease. Remote basal ganglia/periventricular white matter infarcts. No CT findings for acute hemispheric infarction or intracranial hemorrhage. No mass lesions. The brainstem and cerebellum are grossly normal in stable. Vascular: Stable age advanced vascular calcifications but no aneurysm or hyperdense vessels. Skull: No skull fracture or bone lesions.  Sinuses/Orbits: The paranasal sinuses and mastoid air cells are clear. The globes are intact. Other: No scalp lesions or scalp hematoma. IMPRESSION: 1. Stable age advanced cerebral atrophy, mild ventriculomegaly and moderate periventricular white matter disease. 2. Remote basal ganglia/periventricular white matter infarcts. 3. No acute intracranial findings or mass lesions. Electronically Signed   By: Marijo Sanes M.D.   On: 03/25/2021 08:34   CT FEMUR RIGHT W CONTRAST  Result Date: 03/24/2021 CLINICAL DATA:  New medial thigh swelling and redness. Proximal right femur ORIF 4 days ago EXAM: CT OF THE LOWER RIGHT EXTREMITY WITH CONTRAST TECHNIQUE: Multidetector CT imaging of the lower right extremity was performed according to  the standard protocol following intravenous contrast administration. RADIATION DOSE REDUCTION: This exam was performed according to the departmental dose-optimization program which includes automated exposure control, adjustment of the mA and/or kV according to patient size and/or use of iterative reconstruction technique. CONTRAST:  28mL OMNIPAQUE IOHEXOL 300 MG/ML  SOLN COMPARISON:  X-ray 03/23/2021 FINDINGS: Bones/Joint/Cartilage Postsurgical changes of recent ORIF of the proximal right femur with short IM rod, lag screw and distal interlocking screw. Near anatomic alignment of comminuted intertrochanteric proximal right femur fracture. Lesser trochanteric fragment is moderately displaced. Hip joint alignment is maintained without dislocation. There is a healing fracture involving the left greater trochanter with mild displacement. Ligaments Suboptimally assessed by CT. Muscles and Tendons No acute musculotendinous abnormality by CT. Soft tissue air within the right gluteal musculature is presumed postoperative. Soft tissues Prominent edema and ill-defined fluid within the medial subcutaneous tissues of the upper right thigh (series 6, image 104). No organized or rim enhancing collection. Expected postoperative changes within the soft tissues at the anterolateral aspect of the right hip. Small amount of residual soft tissue air at this location is presumed postsurgical. No lymphadenopathy. IMPRESSION: 1. Postsurgical changes of recent ORIF of the proximal right femur. 2. Prominent edema and ill-defined fluid within the medial subcutaneous tissues of the upper right thigh, concerning for soft tissue infection/cellulitis. No organized or rim-enhancing collection. 3. Healing fracture involving the left greater trochanter with mild displacement. Electronically Signed   By: Davina Poke D.O.   On: 03/24/2021 13:07    Assessment/Plan: Right intertrochanteric hip fracture 6 Days Post-Op IM nail Left femur  greater trochanter fracture - treating non-operatively New right medial thigh swelling and redness  Acute blood loss Anemia- 2/17 10.4 > 2/19 8.2 > 2/20 9.6 > 2/21 9.1, will continue to watch, awaiting CBC this morning - post operative x-ray and [place] as above will plan to order imaging with IV contrast to better see if this is new infection or hematoma. WBC 12.5 yesterday, awaiting CBC this morning - CT right femur looked ok.  Medial thigh with cellulitis vs.dermatitis.  No drainable fluid collection.  Some of this may be contributed by her urinary incontinence and mental status challenges.  This does not appear to communicate to her surgical site.  Ok for antibiotics.   Weightbearing: WBAT BLE Insicional and dressing care: reinforce dressings as needed  VTE prophylaxis: Lovenox 40mg  qd x 30 days Pain control: continue current regimen Follow - up plan: 2 weeks with Dr. Mardelle Matte Dispo: pending SNF placement  Contact information:   Weekdays 8-5 Merlene Pulling, PA-C [phone] A fter hours and holidays please check [email or web address] for group call information for [facility]  Ventura Bruns 03/26/2021, 8:36 AM

## 2021-03-26 NOTE — TOC Progression Note (Signed)
Transition of Care [facility]) - Progression Note    Patient Details  Name: Stacie [name] MRN: [id number] Date of Birth: Jan 24, 1956  Transition of Care [facility]) CM/SW Contact  Kurstin Dimarzo, Juliann Pulse, RN Phone Number: 03/26/2021, 1:06 PM  Clinical Narrative: Aida Puffer for [facility] eff 2/22-2/24 Josem Kaufmann MC#[id number] Josem Kaufmann SL#[id number]. Awaiting medical stability.      Expected Discharge Plan: Skilled Nursing Facility Barriers to Discharge: Continued Medical Work up  Expected Discharge Plan and Services Expected Discharge Plan: [place]                                               Social Determinants of Health (SDOH) Interventions    Readmission Risk Interventions Readmission Risk Prevention Plan 03/21/2021  Transportation Screening Complete  PCP or Specialist Appt within 3-5 Days Complete  HRI or [place] Complete  Social Work Consult for [place] Planning/Counseling Complete  Palliative Care Screening Complete  Medication Review [employer]) Complete  Some recent data might be hidden

## 2021-03-26 NOTE — Plan of Care (Signed)

## 2021-03-27 ENCOUNTER — Other Ambulatory Visit: Payer: Self-pay | Admitting: Nurse Practitioner

## 2021-03-27 DIAGNOSIS — R5381 Other malaise: Secondary | ICD-10-CM

## 2021-03-27 DIAGNOSIS — Z7189 Other specified counseling: Secondary | ICD-10-CM

## 2021-03-27 DIAGNOSIS — Z515 Encounter for palliative care: Secondary | ICD-10-CM

## 2021-03-27 DIAGNOSIS — I10 Essential (primary) hypertension: Secondary | ICD-10-CM

## 2021-03-27 LAB — MAGNESIUM: Magnesium: 1.7 mg/dL (ref 1.7–2.4)

## 2021-03-27 LAB — BASIC METABOLIC PANEL
Anion gap: 7 (ref 5–15)
BUN: 8 mg/dL (ref 8–23)
CO2: 24 mmol/L (ref 22–32)
Calcium: 8 mg/dL — ABNORMAL LOW (ref 8.9–10.3)
Chloride: 107 mmol/L (ref 98–111)
Creatinine, Ser: 0.43 mg/dL — ABNORMAL LOW (ref 0.44–1.00)
GFR, Estimated: >60 mL/min (ref >60)
Glucose, Bld: 99 mg/dL (ref 70–99)
Potassium: 3.2 mmol/L — ABNORMAL LOW (ref 3.5–5.1)
Sodium: 138 mmol/L (ref 135–145)

## 2021-03-27 LAB — CBC
HCT: 28.5 % — ABNORMAL LOW (ref 36.0–46.0)
Hemoglobin: 8.8 g/dL — ABNORMAL LOW (ref 12.0–15.0)
MCH: 27.2 pg (ref 26.0–34.0)
MCHC: 30.9 g/dL (ref 30.0–36.0)
MCV: 88 fL (ref 80.0–100.0)
Platelets: 386 10*3/uL (ref 150–400)
RBC: 3.24 MIL/uL — ABNORMAL LOW (ref 3.87–5.11)
RDW: 15.2 % (ref 11.5–15.5)
WBC: 9.6 10*3/uL (ref 4.0–10.5)
nRBC: 0 % (ref 0.0–0.2)

## 2021-03-27 MED ORDER — POTASSIUM CHLORIDE 20 MEQ PO PACK
40.0000 meq | PACK | Freq: Two times a day (BID) | ORAL | Status: AC
  Administered 2021-03-27 (×2): 40 meq via ORAL
  Filled 2021-03-27 (×2): qty 2

## 2021-03-27 NOTE — Assessment & Plan Note (Addendum)
deconditioning, generalized weakness.   Physical therapy has recommended skilled nursing facility placement at this time.

## 2021-03-27 NOTE — Progress Notes (Signed)
Subjective: 7 Days Post-Op s/p Procedure(s): INTRAMEDULLARY (IM) NAIL FEMORAL  Somnolent this morning, but does follow commands. Falls asleep quickly when I ask questions regarding pain.    Objective:  PE: VITALS:   Vitals:   03/26/21 1419 03/26/21 2052 03/26/21 2309 03/27/21 0546  BP: 128/61 (!) 161/68 (!) 159/84 (!) 155/82  Pulse: 74 84 76 74  Resp: 16 18 20    Temp: 98.2 F (36.8 C) 98 F (36.7 C) 98 F (36.7 C) 98 F (36.7 C)  TempSrc: Oral Oral Oral Axillary  SpO2: 96% 100% 96% 97%  Weight:      Height:       General: laying in bed MSK: 2+ DP and PT pulses bilaterally. Dorsiflexion and plantarflexion intact bilaterally. Steri-strips intact but right proximal incision dressing had been removed.  No erythema around either incision. Replaced with new mepliex. erythema to medial right thigh without significant change. TTP to left greater trochanter. Able to flex and extend at bilateral knees and hips.    LABS  Results for orders placed or performed during the hospital encounter of 03/19/21 (from the past 24 hour(s))  CBC     Status: Abnormal   Collection Time: 03/26/21  2:47 PM  Result Value Ref Range   WBC 12.4 (H) 4.0 - 10.5 K/uL   RBC 2.93 (L) 3.87 - 5.11 MIL/uL   Hemoglobin 8.1 (L) 12.0 - 15.0 g/dL   HCT 24.8 (L) 36.0 - 46.0 %   MCV 84.6 80.0 - 100.0 fL   MCH 27.6 26.0 - 34.0 pg   MCHC 32.7 30.0 - 36.0 g/dL   RDW 14.8 11.5 - 15.5 %   Platelets 339 150 - 400 K/uL   nRBC 0.0 0.0 - 0.2 %     CT HEAD WO CONTRAST (5MM)  Result Date: 03/25/2021 CLINICAL DATA:  Acute metabolic encephalopathy. EXAM: CT HEAD WITHOUT CONTRAST TECHNIQUE: Contiguous axial images were obtained from the base of the skull through the vertex without intravenous contrast. RADIATION DOSE REDUCTION: This exam was performed according to the departmental dose-optimization program which includes automated exposure control, adjustment of the mA and/or kV according to patient size and/or use of  iterative reconstruction technique. COMPARISON:  Head CT 03/19/2021 FINDINGS: Brain: Stable age advanced cerebral atrophy, mild ventriculomegaly and moderate periventricular white matter disease. Remote basal ganglia/periventricular white matter infarcts. No CT findings for acute hemispheric infarction or intracranial hemorrhage. No mass lesions. The brainstem and cerebellum are grossly normal in stable. Vascular: Stable age advanced vascular calcifications but no aneurysm or hyperdense vessels. Skull: No skull fracture or bone lesions. Sinuses/Orbits: The paranasal sinuses and mastoid air cells are clear. The globes are intact. Other: No scalp lesions or scalp hematoma. IMPRESSION: 1. Stable age advanced cerebral atrophy, mild ventriculomegaly and moderate periventricular white matter disease. 2. Remote basal ganglia/periventricular white matter infarcts. 3. No acute intracranial findings or mass lesions. Electronically Signed   By: Marijo Sanes M.D.   On: 03/25/2021 08:34    Assessment/Plan: Right intertrochanteric hip fracture 7 Days Post-Op IM nail Left femur greater trochanter fracture - treating non-operatively right medial thigh swelling and redness  Acute blood loss Anemia- 2/17 10.4 > 2/19 8.2 > 2/20 9.6 > 2/21 9.1> 2/22 8.2 > 2/23 8.8 will continue to watch, CBC trending up this morning - post operative x-ray and [place] as above  - WBC trending down to 9.6 today - CT right femur looked ok.  Medial thigh with cellulitis vs.dermatitis.  No drainable fluid collection.  Some of this may be contributed by her urinary incontinence and mental status challenges.  This does not appear to communicate to her surgical site. Likely hematoma as medial thigh looks ecchymotic but not threatening skin.   Weightbearing: WBAT BLE Insicional and dressing care: reinforce dressings as needed  VTE prophylaxis: Lovenox 40mg  qd x 30 days Pain control: continue current regimen Follow - up plan: 2 weeks with Dr.  Mardelle Matte Dispo: PT recommending SNF placement  Contact information:   Weekdays 8-5 Merlene Pulling, PA-C [phone] A fter hours and holidays please check [email or web address] for group call information for [facility]  Ventura Bruns 03/27/2021, 7:37 AM

## 2021-03-27 NOTE — Progress Notes (Addendum)
PROGRESS NOTE    Stacie [name]  TTS:[id number] DOB: 06-07-1955 DOA: 03/19/2021 PCP: [name], NP    Brief Narrative:  Stacie [name] is a 66 y.o. female with past medical history of hypertension, hyperlipidemia, stroke, diastolic heart failure, depression and noncompliance of medication presented to the ED after sustaining a fall while under the influence of alcohol leading to right hip pain and difficulty ambulation.  In the ED patient was hemodynamically stable.  Initial labs showed a potassium of 2.9 with WBC elevated at 11.5.  Right x-ray of the hips hip x-ray showed intertrochanteric right hip fracture with foreshortening and varus angulation as well as mildly displaced left greater trochanter fracture. CT head was unremarkable. Orthopedics was consulted from ED and patient was admitted to the hospital for further evaluation and treatment..  Patient underwent IM nailing of the right femur on 03/20/2021 but postoperatively had episodes of confusion encephalopathy and dysphagia.     Assessment and Plan: * Bilateral hip fractures, closed, initial encounter ([place])- (present on admission) Secondary to mechanical fall under the influence of alcohol.  Patient underwent right femoral intramedullary nail by orthopedic surgery on 03/20/2021.  Continue Lovenox 40 mg daily for 30 days for DVT prophylaxis.  Continue PT/ OT.  PT recommends skilled nursing facility placement on discharge.  Patient to follow-up with Dr. Fabian November orthopedic surgery in 2 weeks.  Acute metabolic encephalopathy Continues to remain somnolent but more verbal today.  Could be multifactorial from hospital induced delirium, postoperative status, analgesia, volume depletion on the background of baseline cognitive dysfunction.  Continue supportive care.  Continue thiamine and folic acid.  Continue hydration.  Bowel and bladder management.  We will discontinue mittens if possible..  Hypokalemia- (present on  admission) Has received multiple replacements.  Potassium of 3.2 today.  We will replenish orally today.  Check levels in a.m.  Alcohol abuse- (present on admission) Continue CIWA protocol.  Alcohol level was 111 on admission.  Avoid oversedation.  Still on Ativan for agitation.  Cellulitis of right thigh-resolved as of 03/27/2021 Possible mild cellulitis/dermatitis.  Received Rocephin.  We will discontinue at this time.  Acute blood loss anemia Postoperative.  Latest hemoglobin of 8.8.  We will continue to monitor.  Transfuse for hemoglobin less than 7.  Debility  deconditioning, generalized weakness.  Physical therapy has been consulted.  Physical therapy has recommended skilled nursing facility placement at this time.  Scabies- (present on admission) On presentation.  Permethrin cream was applied x1 on 2/18.  Continue isolation   Dysphagia Seen by speech and therapy.  Continue aspiration precautions.  Speech recommending  Liquids, p.o. medications with ice cream and thin sips of water.  Protein-calorie malnutrition, severe- (present on admission) Significant weight loss of around 100 pounds.  Continue Ensure supplementation.  Chronic diastolic CHF (congestive heart failure) ([place])- (present on admission) 2D echocardiogram from 2021 showed LV ejection fraction of 60 to 65% with grade 1 diastolic dysfunction.  Currently compensated.  Continue intake and output charting, Daily weights.  On D5 normal saline due to poor oral intake.  Reassess the need for IV fluids in AM.  Chronic diarrhea- (present on admission) History of chronic diarrhea and decreased oral intake.  Had been to MD in the past as per the patient's family.  Patient doesn't eat well because of this and has always been underweight as per the family..  Hyperlipidemia- (present on admission) Continue simvastatin.  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  Continue statins.  Repeat CT head  scan on 03/24/21 without acute findings.  Essential hypertension- (present on admission) Continue amlodipine hydralazine and Imdur as able orally.    DVT prophylaxis: enoxaparin (LOVENOX) injection 40 mg Start: 03/21/21 0800 SCDs Start: 03/20/21 1852 SCDs Start: 03/20/21 0024   Code Status:     Code Status: DNR  Disposition: Skilled nursing facility  Status is: Inpatient  Remains inpatient appropriate because: Metabolic encephalopathy, status post surgery, need for rehabilitation placement.   Family Communication:  I spoke with the patient's daughter on the phone on 03/25/2021  Consultants:  Orthopedics  Procedures:  Right femoral IM nailing on 03/20/2021  Antimicrobials:  Rocephin IV   Subjective: Today, patient was seen and examined at bedside.  More alert awake but easily falls asleep.  States that that she is in the hospital.  Denies overt pain.  Objective: Vitals:   03/26/21 1419 03/26/21 2052 03/26/21 2309 03/27/21 0546  BP: 128/61 (!) 161/68 (!) 159/84 (!) 155/82  Pulse: 74 84 76 74  Resp: 16 18 20    Temp: 98.2 F (36.8 C) 98 F (36.7 C) 98 F (36.7 C) 98 F (36.7 C)  TempSrc: Oral Oral Oral Axillary  SpO2: 96% 100% 96% 97%  Weight:      Height:        Intake/Output Summary (Last 24 hours) at 03/27/2021 1117 Last data filed at 03/27/2021 0600 Gross per 24 hour  Intake 2183.2 ml  Output 2350 ml  Net -166.8 ml   Filed Weights   03/20/21 1529 03/23/21 0500  Weight: 43.1 kg 42.5 kg    Physical Examination:  General: Thinly built, not in obvious distress.  On mittens, closes eyes HENT:   No scleral pallor or icterus noted. Oral mucosa is dry Chest:   Diminished breath sounds bilaterally.   CVS: S1 &S2 heard. No murmur.  Regular rate and rhythm. Abdomen: Soft, nontender, nondistended.  Bowel sounds are heard.   Extremities: No cyanosis, clubbing or edema.  Peripheral pulses are palpable. Psych: Somnolent, answering few questions, oriented to place,  on mittens CNS: Somnolent,  Moving extremities Skin: Warm and dry.  Right thigh with area of ecchymosis.   Data Reviewed:   CBC: Recent Labs  Lab 03/21/21 0447 03/23/21 0634 03/24/21 0853 03/25/21 1014 03/26/21 1447 03/27/21 0732  WBC 12.9* 8.8 8.7 12.5* 12.4* 9.6  NEUTROABS 10.3* 6.7  --   --   --   --   HGB 10.4* 8.2* 9.6* 9.1* 8.1* 8.8*  HCT 32.0* 26.2* 29.8* 29.2* 24.8* 28.5*  MCV 82.9 85.3 85.1 85.4 84.6 88.0  PLT 404* 291 348 343 339 846    Basic Metabolic Panel: Recent Labs  Lab 03/21/21 0447 03/23/21 0634 03/24/21 0853 03/25/21 0456 03/25/21 0559 03/26/21 0506 03/27/21 0732  NA 131* 138 137 137  --  138 138  K 3.2* 3.5 3.5 2.6*  --  3.5 3.2*  CL 98 104 102 102  --  107 107  CO2 23 25 28 26   --  24 24  GLUCOSE 122* 109* 106* 85  --  121* 99  BUN 16 11 9 11   --  10 8  CREATININE 0.66 0.42* 0.49 0.51  --  0.44 0.43*  CALCIUM 7.7* 8.5* 8.5* 8.3*  --  8.2* 8.0*  MG 1.8  --   --   --  2.0 1.9 1.7    Liver Function Tests: No results for input(s): AST, ALT, ALKPHOS, BILITOT, PROT, ALBUMIN in the last 168 hours.  Radiology Studies: No results found.    LOS: 8 days    Flora Lipps, MD [facility] 03/27/2021, 11:17 AM

## 2021-03-27 NOTE — Progress Notes (Signed)
Speech Language Pathology Treatment: Dysphagia  Patient Details Name: Stacie [name] MRN: [id number] DOB: [birth year] Today's Date: 03/27/2021 Time: 1700-1749 SLP Time Calculation (min) (ACUTE ONLY): 20 min  Assessment / Plan / Recommendation Clinical Impression  Pt seen for skilled SLP therapy to address dysphagia goals.  Today pt is fully alert and willing to consume po intake.  She self fed water, Ensure, crackers and applesauce.  Delayed swallow continues -most notably on final swallow of sequential swallows - which per family a few days ago is baseline.  Pt with subtle cough x1 with thin water when attempting 3 ounce [place] water challenge. Small single boluses did not produce any cough or throat clearing that may indicate laryngeal penetration or aspiration. Pt able to self feed with her right hand with set up - but may need minimal help due to deconditioning. Minimal prolonged mastication observed - likely due to continued but improved AMS - with pt clearing independently with Ensure swallow.  Pt needs max cueing for proper positioning and thus set up and intermittent supervision is warranted. Recommend regular/thin diet - SLP will follow up x1 given premorbid coughing with thin liquids and oral holding along with prolonged hospital coarse/deconditioning.  Pt states her swallow function is WFL.  RN, NT and MD informed and agreeable.  Left pt with menu to select food items for lunch.    HPI HPI: Patient is a 66 y.o. female with PMH: ETOH abuse, diastolic CHF, HLD, HTN, depression, CVA, noncompliance with medication who presented to ED on 2/15 with fall at home while under influence of alcohol, leading to right hipo pain and inability to ambulate. In ED patient was hemodynamically stable, x-ray of right hip showed intertrochanteric hipo fracture with foreshortening and varus angulation as well as mildly displaced left greater trochanter fracture. CT head unremarkable. She underwent right  femoral intramedullary nail by orthopedic surgery on 2/16. She was started on a regular solids, thin liquids diet however night shift RN reported on 2/20 that patient coughed with dose of PRN pain medication and as h/o dysphagia noted in chart, SLP swallow evaluation ordered.      SLP Plan  Continue with current plan of care      Recommendations for follow up therapy are one component of a multi-disciplinary discharge planning process, led by the attending physician.  Recommendations may be updated based on patient status, additional functional criteria and insurance authorization.    Recommendations  Diet recommendations: Regular;Thin liquid Liquids provided via: Straw;Cup Medication Administration: Whole meds with puree (crush with icecream or applesauce) Supervision: Patient able to self feed;Staff to assist with self feeding Compensations: Slow rate;Small sips/bites Postural Changes and/or Swallow Maneuvers: Upright 30-60 min after meal;Seated upright 90 degrees                Oral Care Recommendations: Other (Comment);Staff/trained caregiver to provide oral care;Oral care BID Follow Up Recommendations: Skilled nursing-short term rehab (<3 hours/day) Assistance recommended at discharge: Frequent or constant Supervision/Assistance SLP Visit Diagnosis: Dysphagia, unspecified (R13.10);Dysphagia, oropharyngeal phase (R13.12) Plan: Continue with current plan of care           Vertell Limber, MS [facility] SLP [place] Office [phone] Pager [phone]  03/27/2021, 1:24 PM

## 2021-03-27 NOTE — Consult Note (Signed)
Consultation Note Date: 03/27/2021   Patient Name: Stacie [name]  DOB: [birth year]  MRN: [id number]  Age / Sex: 66 y.o., female  PCP: Nche, Charlene Brooke, NP Referring Physician: Flora Lipps, MD  Reason for Consultation: Establishing goals of care  HPI/Patient Profile: 66 y.o. female  with past medical history of stroke, diastolic heart faiure, depression, non-adherence of medication admitted on 03/19/2021 with fall with right hip pain and difficulty ambulating due to right hip fracture. S/P surgical repair 03/20/21. Hospitalization complicated by ongoing encephalopathy with poor intake/dysphagia, cellulitis of right thigh, scabies  Clinical Assessment and Goals of Care: I met today with Stacie [name] but no family at bedside. Reviewed records and events of hospitalization. She has been extremely encephalopathic and unable to awaken enough to obtain nutrition. Stacie [name] today is more awake than what is described in previous days. She interacts and speaks with me. She is able to tell me she is here because "I fell and hurt my leg" and she is asking to go home. I explained to her that she had surgery and is still very weak so she is not well enough yet to return home. She becomes very tearful about telling me she wants to come home. It became difficult to speak with her about anything else. I reassured her and left her to rest.   I called and spoke with daughter, Stacie [name]. Stacie [name] is en-route from [place] to come visit with her mother. 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 We discussed her mother's poor progression likely complicated from her underlying alcohol use, history of strokes, and malnutrition. Stacie [name] explains that her mother has always been very thin and she has lost more weight Stacie [name] believes more so related to her colitis. She has increased diarrhea but then does not seek attention and treatment as quickly  as she should.   I discussed more with Stacie [name] my concern for her ability to maintain her improved alertness today and especially her ability to increase her intake to an adequate amount to thrive. Stacie [name] is hopeful that this will improve now that she is more awake. Stacie [name] has more hope with the improvement today. I discussed with Stacie [name] the importance of hoping for further improvement but also continued conversation with her sister about what to do if she gets worse or is unable to maintain nutrition. I mentioned feeding tube and Stacie [name] is hoping that this will not be needed. She mentions that she her mother would likely pull this out (she pulled her IV out today). I explained that this could be very likely and this is the importance of the ongoing discussions. If she struggles to maintain nutrition and we feel feeding tube is not a good idea we will have to regroup expectations. Stacie [name] understands the importance of continued discussions but is more hopeful for improvement since her mother is more alert and interactive today.   All questions/concerns addressed. Emotional support provided.   Primary Decision Maker NEXT OF KIN daughters    SUMMARY OF RECOMMENDATIONS   - DNR already in place -  Family hopeful for improvement; no other decisions made today  Code Status/Advance Care Planning: DNR   Symptom Management:  Per attending.   Palliative Prophylaxis:  Aspiration, Bowel Regimen, Delirium Protocol, Frequent Pain Assessment, and Turn Reposition  Prognosis:  Overall prognosis guarded. Signs of significant malnutrition.   Discharge Planning: To Be Determined      Primary Diagnoses: Present on Admission:  Essential hypertension  Hyperlipidemia  Alcohol abuse  Chronic diastolic CHF (congestive heart failure) ([facility])  Bilateral hip fractures, closed, initial encounter ([place])  Hypokalemia  Protein-calorie malnutrition, severe  Scabies  Chronic diarrhea   I have reviewed  the medical record, interviewed the patient and family, and examined the patient. The following aspects are pertinent.  Past Medical History:  Diagnosis Date   Colitis    Hyperlipidemia    Hypertension    Stroke ([place]) 04/2012   unknown type   Thyroid disease    Social History   Socioeconomic History   Marital status: Divorced    Spouse name: Not on file   Number of children: Not on file   Years of education: Not on file   Highest education level: Not on file  Occupational History   Occupation: retired  Tobacco Use   Smoking status: Former    Packs/day: 0.25    Years: 10.00    Pack years: 2.50    Types: Cigarettes    Quit date: 05/2019    Years since quitting: 1.8   Smokeless tobacco: Never  Vaping Use   Vaping Use: Never used  Substance and Sexual Activity   Alcohol use: Yes    Alcohol/week: 14.0 standard drinks    Types: 14 Cans of beer per week    Comment: 8 beers a week   Drug use: Yes    Frequency: 7.0 times per week    Types: Marijuana    Comment: to help with colitis- multiple times a day   Sexual activity: Not Currently  Other Topics Concern   Not on file  Social History Narrative   Not on file   Social Determinants of Health   Financial Resource Strain: Low Risk    Difficulty of Paying Living Expenses: Not hard at all  Food Insecurity: Not on file  Transportation Needs: Not on file  Physical Activity: Not on file  Stress: Not on file  Social Connections: Not on file   Family History  Problem Relation Age of Onset   Stroke Mother 66       embolic   Heart disease Father 37       CAd with CABG, no MI   Heart disease Sister 39       CAD   Heart disease Paternal Uncle    Heart disease Maternal Grandmother    Heart disease Paternal Grandfather    Colon cancer Neg Hx    Esophageal cancer Neg Hx    Stomach cancer Neg Hx    Scheduled Meds:  amLODipine  10 mg Oral Daily   budesonide  9 mg Oral Daily   Chlorhexidine Gluconate Cloth  6 each  Topical Daily   docusate sodium  100 mg Oral BID   enoxaparin (LOVENOX) injection  40 mg Subcutaneous Q24H   escitalopram  20 mg Oral Daily   feeding supplement  237 mL Oral TID BM   folic acid  1 mg Oral Daily   hydrALAZINE  50 mg Oral Q8H   isosorbide mononitrate  30 mg Oral Daily   mouth rinse  15  mL Mouth Rinse BID   multivitamin with minerals  1 tablet Oral Daily   senna  1 tablet Oral BID   simvastatin  20 mg Oral q1800   thiamine  100 mg Oral Daily   Or   thiamine  100 mg Intravenous Daily   Vitamin D (Ergocalciferol)  50,000 Units Oral Q7 days   Continuous Infusions:  cefTRIAXone (ROCEPHIN)  IV 1 g (03/26/21 1616)   dextrose 5 % and 0.9% NaCl 75 mL/hr at 03/27/21 8478   methocarbamol (ROBAXIN) IV 500 mg (03/25/21 1238)   PRN Meds:.acetaminophen, alum & mag hydroxide-simeth, bisacodyl, hydrALAZINE, HYDROcodone-acetaminophen, labetalol, lip balm, LORazepam **OR** LORazepam, menthol-cetylpyridinium **OR** phenol, methocarbamol (ROBAXIN) IV, morphine injection, ondansetron **OR** ondansetron (ZOFRAN) IV, polyethylene glycol Allergies  Allergen Reactions   Trazodone And Nefazodone Nausea And Vomiting   Wellbutrin [Bupropion] Nausea And Vomiting   Review of Systems  Unable to perform ROS: Other  Constitutional:        Patient tearful and not cooperative with all questions   Physical Exam Vitals and nursing note reviewed.  Constitutional:      General: She is not in acute distress.    Appearance: She is cachectic. She is ill-appearing.  Cardiovascular:     Rate and Rhythm: Normal rate.  Pulmonary:     Effort: No tachypnea, accessory muscle usage or respiratory distress.  Abdominal:     General: Abdomen is flat.     Palpations: Abdomen is soft.  Neurological:     Mental Status: She is alert.     Comments: Oriented to person and place and somewhat to situation    Vital Signs: BP (!) 155/82 (BP Location: Left Arm)    Pulse 74    Temp 98 F (36.7 C) (Axillary)    Resp  20    Ht 5' 4.25" (1.632 m)    Wt 42.5 kg    SpO2 97%    BMI 15.96 kg/m  Pain Scale: 0-10 POSS *See Group Information*: 1-Acceptable,Awake and alert Pain Score: 0-No pain   SpO2: SpO2: 97 % O2 Device:SpO2: 97 % O2 Flow Rate: .O2 Flow Rate (L/min): 2 L/min  IO: Intake/output summary:  Intake/Output Summary (Last 24 hours) at 03/27/2021 0912 Last data filed at 03/27/2021 0600 Gross per 24 hour  Intake 2183.2 ml  Output 2350 ml  Net -166.8 ml    LBM: Last BM Date : 03/20/21 Baseline Weight: Weight: 43.1 kg Most recent weight: Weight: 42.5 kg     Palliative Assessment/Data:    Time Total: 55 min  Greater than 50%  of this time was spent counseling and coordinating care related to the above assessment and plan.  Signed by: Vinie Sill, NP Palliative Medicine Team Pager # [phone] (M-F 8a-5p) Team Phone # [phone] (Nights/Weekends)

## 2021-03-27 NOTE — Progress Notes (Signed)
Pt. was more alert and oriented during assessment. Able to verbalize her needs to staff. Mittens were removed and patient was keep comfortable after CHG bath and full linen changed.   On follow up routine rounding pt. had pulled her IV, attempted to  pull off FC. Pt. Was placed back on mittens. Will continue to keep patient safe. Closely monitored.

## 2021-03-28 DIAGNOSIS — W19XXXD Unspecified fall, subsequent encounter: Secondary | ICD-10-CM | POA: Diagnosis not present

## 2021-03-28 DIAGNOSIS — S72001D Fracture of unspecified part of neck of right femur, subsequent encounter for closed fracture with routine healing: Secondary | ICD-10-CM | POA: Diagnosis not present

## 2021-03-28 DIAGNOSIS — M2550 Pain in unspecified joint: Secondary | ICD-10-CM | POA: Diagnosis not present

## 2021-03-28 DIAGNOSIS — E46 Unspecified protein-calorie malnutrition: Secondary | ICD-10-CM | POA: Diagnosis not present

## 2021-03-28 DIAGNOSIS — G9340 Encephalopathy, unspecified: Secondary | ICD-10-CM | POA: Diagnosis not present

## 2021-03-28 DIAGNOSIS — S72009A Fracture of unspecified part of neck of unspecified femur, initial encounter for closed fracture: Secondary | ICD-10-CM | POA: Diagnosis not present

## 2021-03-28 DIAGNOSIS — S72002E Fracture of unspecified part of neck of left femur, subsequent encounter for open fracture type I or II with routine healing: Secondary | ICD-10-CM | POA: Diagnosis not present

## 2021-03-28 DIAGNOSIS — E782 Mixed hyperlipidemia: Secondary | ICD-10-CM | POA: Diagnosis not present

## 2021-03-28 DIAGNOSIS — R197 Diarrhea, unspecified: Secondary | ICD-10-CM | POA: Diagnosis not present

## 2021-03-28 DIAGNOSIS — N390 Urinary tract infection, site not specified: Secondary | ICD-10-CM | POA: Diagnosis not present

## 2021-03-28 DIAGNOSIS — R5381 Other malaise: Secondary | ICD-10-CM | POA: Diagnosis not present

## 2021-03-28 DIAGNOSIS — E876 Hypokalemia: Secondary | ICD-10-CM | POA: Diagnosis not present

## 2021-03-28 DIAGNOSIS — I639 Cerebral infarction, unspecified: Secondary | ICD-10-CM | POA: Diagnosis not present

## 2021-03-28 DIAGNOSIS — S72001E Fracture of unspecified part of neck of right femur, subsequent encounter for open fracture type I or II with routine healing: Secondary | ICD-10-CM | POA: Diagnosis not present

## 2021-03-28 DIAGNOSIS — E43 Unspecified severe protein-calorie malnutrition: Secondary | ICD-10-CM | POA: Diagnosis not present

## 2021-03-28 DIAGNOSIS — R262 Difficulty in walking, not elsewhere classified: Secondary | ICD-10-CM | POA: Diagnosis not present

## 2021-03-28 DIAGNOSIS — I69319 Unspecified symptoms and signs involving cognitive functions following cerebral infarction: Secondary | ICD-10-CM | POA: Diagnosis not present

## 2021-03-28 DIAGNOSIS — I10 Essential (primary) hypertension: Secondary | ICD-10-CM | POA: Diagnosis not present

## 2021-03-28 DIAGNOSIS — K5900 Constipation, unspecified: Secondary | ICD-10-CM | POA: Diagnosis not present

## 2021-03-28 DIAGNOSIS — I5032 Chronic diastolic (congestive) heart failure: Secondary | ICD-10-CM | POA: Diagnosis not present

## 2021-03-28 DIAGNOSIS — D62 Acute posthemorrhagic anemia: Secondary | ICD-10-CM | POA: Diagnosis not present

## 2021-03-28 DIAGNOSIS — S72115D Nondisplaced fracture of greater trochanter of left femur, subsequent encounter for closed fracture with routine healing: Secondary | ICD-10-CM | POA: Diagnosis not present

## 2021-03-28 DIAGNOSIS — E785 Hyperlipidemia, unspecified: Secondary | ICD-10-CM | POA: Diagnosis not present

## 2021-03-28 DIAGNOSIS — F1010 Alcohol abuse, uncomplicated: Secondary | ICD-10-CM | POA: Diagnosis not present

## 2021-03-28 DIAGNOSIS — G9341 Metabolic encephalopathy: Secondary | ICD-10-CM | POA: Diagnosis not present

## 2021-03-28 DIAGNOSIS — L03115 Cellulitis of right lower limb: Secondary | ICD-10-CM | POA: Diagnosis not present

## 2021-03-28 DIAGNOSIS — Z7401 Bed confinement status: Secondary | ICD-10-CM | POA: Diagnosis not present

## 2021-03-28 DIAGNOSIS — R2681 Unsteadiness on feet: Secondary | ICD-10-CM | POA: Diagnosis not present

## 2021-03-28 DIAGNOSIS — S72144D Nondisplaced intertrochanteric fracture of right femur, subsequent encounter for closed fracture with routine healing: Secondary | ICD-10-CM | POA: Diagnosis not present

## 2021-03-28 DIAGNOSIS — K529 Noninfective gastroenteritis and colitis, unspecified: Secondary | ICD-10-CM | POA: Diagnosis not present

## 2021-03-28 DIAGNOSIS — R1310 Dysphagia, unspecified: Secondary | ICD-10-CM | POA: Diagnosis not present

## 2021-03-28 DIAGNOSIS — M6281 Muscle weakness (generalized): Secondary | ICD-10-CM | POA: Diagnosis not present

## 2021-03-28 DIAGNOSIS — S72001A Fracture of unspecified part of neck of right femur, initial encounter for closed fracture: Secondary | ICD-10-CM | POA: Diagnosis not present

## 2021-03-28 DIAGNOSIS — I5030 Unspecified diastolic (congestive) heart failure: Secondary | ICD-10-CM | POA: Diagnosis not present

## 2021-03-28 LAB — CBC
HCT: 27.7 % — ABNORMAL LOW (ref 36.0–46.0)
Hemoglobin: 8.7 g/dL — ABNORMAL LOW (ref 12.0–15.0)
MCH: 27.4 pg (ref 26.0–34.0)
MCHC: 31.4 g/dL (ref 30.0–36.0)
MCV: 87.4 fL (ref 80.0–100.0)
Platelets: 426 10*3/uL — ABNORMAL HIGH (ref 150–400)
RBC: 3.17 MIL/uL — ABNORMAL LOW (ref 3.87–5.11)
RDW: 15.7 % — ABNORMAL HIGH (ref 11.5–15.5)
WBC: 9.8 10*3/uL (ref 4.0–10.5)
nRBC: 0 % (ref 0.0–0.2)

## 2021-03-28 LAB — BASIC METABOLIC PANEL
Anion gap: 6 (ref 5–15)
BUN: 12 mg/dL (ref 8–23)
CO2: 27 mmol/L (ref 22–32)
Calcium: 8.4 mg/dL — ABNORMAL LOW (ref 8.9–10.3)
Chloride: 107 mmol/L (ref 98–111)
Creatinine, Ser: 0.5 mg/dL (ref 0.44–1.00)
GFR, Estimated: >60 mL/min (ref >60)
Glucose, Bld: 99 mg/dL (ref 70–99)
Potassium: 4.1 mmol/L (ref 3.5–5.1)
Sodium: 140 mmol/L (ref 135–145)

## 2021-03-28 MED ORDER — ADULT MULTIVITAMIN W/MINERALS CH: 1.0000 | ORAL_TABLET | Freq: Every day | ORAL | Status: AC

## 2021-03-28 MED ORDER — POLYETHYLENE GLYCOL 3350 17 G PO PACK: 17.0000 g | PACK | Freq: Every day | ORAL | 0 refills | Status: DC | PRN

## 2021-03-28 MED ORDER — ENSURE ENLIVE PO LIQD
237.0000 mL | Freq: Three times a day (TID) | ORAL | Status: AC
Start: 2021-03-28 — End: 2021-04-27

## 2021-03-28 MED ORDER — BOOST / RESOURCE BREEZE PO LIQD CUSTOM
1.0000 | Freq: Two times a day (BID) | ORAL | Status: DC
  Administered 2021-03-28 (×2): 1 via ORAL

## 2021-03-28 MED ORDER — VITAMIN D (ERGOCALCIFEROL) 1.25 MG (50000 UNIT) PO CAPS
50000.0000 [IU] | ORAL_CAPSULE | ORAL | 0 refills | Status: AC
Start: 2021-03-28 — End: 2021-04-19

## 2021-03-28 MED ORDER — FOLIC ACID 1 MG PO TABS: 1.0000 mg | ORAL_TABLET | Freq: Every day | ORAL | Status: DC

## 2021-03-28 MED ORDER — THIAMINE HCL 100 MG PO TABS: 100.0000 mg | ORAL_TABLET | Freq: Every day | ORAL | Status: DC

## 2021-03-28 MED ORDER — DOCUSATE SODIUM 100 MG PO CAPS
100.0000 mg | ORAL_CAPSULE | Freq: Two times a day (BID) | ORAL | 0 refills | Status: AC
Start: 2021-03-28 — End: 2021-04-07

## 2021-03-28 NOTE — Progress Notes (Signed)
Physical Therapy Treatment Patient Details Name: Stacie [name] MRN: [id number] DOB: 01/29/1956 Today's Date: 03/28/2021   History of Present Illness Pt. is a 66 y.o. female presenting to [facility] on 2/15 for a fall onto her right hip while intoxicated which she was unable to ambulate afterwards. Dx of R hip intertrochanteric fracture.  Right hip intramedullary nail performed on 2/16 for femoral neck fracture, WBAT per op note. Pt also has Mildly displaced left greater trochanter fracture.PMH significant of alcohol abuse, diastolic CHF, HLD, HTN, depression. Has B mild weakness she reports from a previous stroke. She also reports if she doesn't drink she gets occasional shakes but has never had a seizure or withdraw symptoms.    PT Comments    Pt more alert today, anxious about mobility and intermittently tearful but A&Ox4. Pt continues to complain of significant R hip pain, overall requiring mod +2 for bed mobility and transfer OOB to recliner. Pt unable to progress mobility beyond this given pain. PT continuing to recommend SNF level of care post-acutely, which pt agrees with given current functional level. PT to continue to follow.     Recommendations for follow up therapy are one component of a multi-disciplinary discharge planning process, led by the attending physician.  Recommendations may be updated based on patient status, additional functional criteria and insurance authorization.  Follow Up Recommendations  Skilled nursing-short term rehab (<3 hours/day)     Assistance Recommended at Discharge Frequent or constant Supervision/Assistance  Patient can return home with the following A lot of help with walking and/or transfers;A lot of help with bathing/dressing/bathroom;Direct supervision/assist for financial management;Direct supervision/assist for medications management;Assist for transportation;Help with stairs or ramp for entrance   Equipment Recommendations  None recommended  by PT    Recommendations for Other Services       Precautions / Restrictions Precautions Precautions: Fall Restrictions RLE Weight Bearing: Weight bearing as tolerated LLE Weight Bearing: Weight bearing as tolerated     Mobility  Bed Mobility Overal bed mobility: Needs Assistance Bed Mobility: Supine to Sit     Supine to sit: Mod assist     General bed mobility comments: assist for trunk elevation and LE lowering over EOB. Increased time and effort, multiple start/stops secondary to anxiety and pain    Transfers Overall transfer level: Needs assistance Equipment used: Rolling walker (2 wheels) Transfers: Sit to/from Stand, Bed to chair/wheelchair/BSC Sit to Stand: Mod assist, +2 safety/equipment   Step pivot transfers: Mod assist, +2 physical assistance       General transfer comment: mod +2 for power up, rise, steadying, bringing trunk over BOS; STS x2 from EOB. Mod +2 for pivot to recliner towards R for weight shifting, progressing RLE, safe and slow eccentric lower into chair.    Ambulation/Gait               General Gait Details: nt   Marine scientist Rankin (Stroke Patients Only)       Balance Overall balance assessment: Needs assistance Sitting-balance support: No upper extremity supported, Feet supported Sitting balance-Leahy Scale: Fair Sitting balance - Comments: able to sit EOB without PT support Postural control: Left lateral lean (with R hip pain, intermittent) Standing balance support: Bilateral upper extremity supported, During functional activity Standing balance-Leahy Scale: Poor Standing balance comment: reliant on external support  Cognition Arousal/Alertness: Awake/alert Behavior During Therapy: WFL for tasks assessed/performed, Anxious Overall Cognitive Status: Impaired/Different from baseline Area of Impairment: Attention, Memory, Following  commands, Awareness, Problem solving                   Current Attention Level: Sustained Memory: Decreased short-term memory Following Commands: Follows one step commands with increased time Safety/Judgement: Decreased awareness of deficits Awareness: Emergent Problem Solving: Decreased initiation, Difficulty sequencing, Requires verbal cues, Requires tactile cues General Comments: pt is very anxious about pain, fearful of moving due to R hip pain at rest. Pt following commands consistently with mod encouragement and multimodal cuing        Exercises General Exercises - Lower Extremity Long Arc Quad: AAROM, Both, 10 reps, Seated Heel Slides: AAROM, Both, 10 reps, Supine, Limitations Heel Slides Limitations: RLE guarding, lacks 25 deg knee extension secondary to pain    General Comments        Pertinent Vitals/Pain Pain Assessment Pain Assessment: Faces Faces Pain Scale: Hurts even more Pain Location: R hip Pain Descriptors / Indicators: Moaning, Grimacing, Guarding Pain Intervention(s): Monitored during session, Limited activity within patient's tolerance, Repositioned    Home Living                          Prior Function            PT Goals (current goals can now be found in the care plan section) Acute Rehab PT Goals PT Goal Formulation: With patient Time For Goal Achievement: 04/04/21 Potential to Achieve Goals: Good Progress towards PT goals: Progressing toward goals    Frequency    Min 3X/week      PT Plan Current plan remains appropriate    Co-evaluation              AM-PAC PT "6 Clicks" Mobility   Outcome Measure  Help needed turning from your back to your side while in a flat bed without using bedrails?: A Lot Help needed moving from lying on your back to sitting on the side of a flat bed without using bedrails?: A Lot Help needed moving to and from a bed to a chair (including a wheelchair)?: A Lot Help needed standing up  from a chair using your arms (e.g., wheelchair or bedside chair)?: A Lot Help needed to walk in hospital room?: Total Help needed climbing 3-5 steps with a railing? : Total 6 Click Score: 10    End of Session   Activity Tolerance: Patient limited by pain Patient left: in chair;with call bell/phone within reach;with chair alarm set Nurse Communication: Mobility status PT Visit Diagnosis: Other abnormalities of gait and mobility (R26.89);Muscle weakness (generalized) (M62.81) Pain - Right/Left: Right Pain - part of body: Hip     Time: 7096-2836 PT Time Calculation (min) (ACUTE ONLY): 26 min  Charges:  $Therapeutic Activity: 23-37 mins                     Stacie Glaze, PT DPT Acute Rehabilitation Services Pager [phone]  Office [phone]    Camden E Ruffin Pyo 03/28/2021, 10:50 AM

## 2021-03-28 NOTE — TOC Transition Note (Signed)
Transition of Care [facility]) - CM/SW Discharge Note   Patient Details  Name: Stacie [name] MRN: [id number] Date of Birth: July 09, 1955  Transition of Care [facility]) CM/SW Contact:  Dessa Phi, RN Phone Number: 03/28/2021, 1:01 PM   Clinical Narrative: d/c today to [facility] rep Janie accepted,has a bed gong to rm#[id number],nsg call report tel#[phone]-PTAR scheduled p/u @ 3p. DNR in shadow chart for MD signature;script in packet. No further CM needs.      Final next level of care: Skilled Nursing Facility Barriers to Discharge: No Barriers Identified   Patient Goals and CMS Choice Patient states their goals for this hospitalization and ongoing recovery are:: Home/rehab CMS Medicare.gov Compare Post Acute Care list provided to:: Patient Represenative (must comment) (Dtr [name])    Discharge Placement              Patient chooses bed at: [facility] Patient to be transferred to facility by: Annetta Name of family member notified: Megan dtr [phone] Patient and family notified of of transfer: 03/28/21  Discharge Plan and Services                                     Social Determinants of Health (SDOH) Interventions     Readmission Risk Interventions Readmission Risk Prevention Plan 03/21/2021  Transportation Screening Complete  PCP or Specialist Appt within 3-5 Days Complete  HRI or [place] Complete  Social Work Consult for [place] Planning/Counseling Complete  Palliative Care Screening Complete  Medication Review Press photographer) Complete  Some recent data might be hidden

## 2021-03-28 NOTE — Discharge Summary (Signed)
Physician Discharge Summary   Patient: Stacie [name] MRN: [id number] DOB: 1955/06/29  Admit date:     03/19/2021  Discharge date: 03/28/21  Discharge Physician: Flora Lipps   PCP: [name], NP   Recommendations at discharge:   Follow-up with your primary care provider at the skilled nursing facility in 3 to 5 days.  Check blood work at that time. Follow-up with Dr. Mardelle Matte with orthopedics in 2 weeks for postsurgical follow-up.  Discharge Diagnoses: Principal Problem:   Bilateral hip fractures, closed, initial encounter ([place]) Active Problems:   Hypokalemia   Acute metabolic encephalopathy   Alcohol abuse   Acute blood loss anemia   Essential hypertension   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 (CVA)   Hyperlipidemia   Chronic diarrhea   Chronic diastolic CHF (congestive heart failure) (HCC)   Protein-calorie malnutrition, severe   Dysphagia   Scabies   Debility  Resolved Problems:   Cellulitis of right thigh   Hospital Course: Stacie [name] is a 66 y.o. female with past medical history of hypertension, hyperlipidemia, stroke, diastolic heart failure, depression and noncompliance of medication presented to the ED after sustaining a fall while under the influence of alcohol leading to right hip pain and difficulty ambulation.  In the ED patient was hemodynamically stable.  Initial labs showed a potassium of 2.9 with WBC elevated at 11.5.  Right x-ray of the hips hip x-ray showed intertrochanteric right hip fracture with foreshortening and varus angulation as well as mildly displaced left greater trochanter fracture. CT head was unremarkable. Orthopedics was consulted from ED and patient was admitted to the hospital for further evaluation and treatment..  Patient underwent IM nailing of the right femur on 03/20/2021 but postoperatively had episodes of confusion encephalopathy and dysphagia.  This has subsequently improved at this  time.   Assessment and Plan: * Bilateral hip fractures, closed, initial encounter ([place])- (present on admission) Secondary to mechanical fall under the influence of alcohol.  Patient underwent right femoral intramedullary nail by orthopedic surgery on 03/20/2021.  Continue Lovenox 40 mg daily for 30 days for DVT prophylaxis.  Continue PT/ OT.  PT recommends skilled nursing facility placement on discharge.  Patient to follow-up with Dr. Fabian November orthopedic surgery in 2 weeks.  Acute metabolic encephalopathy Improved at this time.  Thought to be secondary to hospital induced delirium, postoperative status, analgesia, volume depletion on the background of baseline cognitive dysfunction.   Continue thiamine and folic acid on discharge.  Patient is afebrile.  Hypokalemia- (present on admission) Resolved after replacement.  Latest potassium of 4.1  Alcohol abuse- (present on admission) Received CIWA protocol.  Alcohol level was 111 on admission.  No signs of withdrawal at this time.  Cellulitis of right thigh-resolved as of 03/27/2021 Possible mild cellulitis/dermatitis.  Received Rocephin.  Antibiotics were discontinued  Acute blood loss anemia Postoperative.  Latest hemoglobin of 8.7.  Hemoglobin remained stable.  Patient did not require any blood transfusion.  Patient will continue on Folic acid discharge  Debility  deconditioning, generalized weakness.   Physical therapy has recommended skilled nursing facility placement at this time.  Scabies- (present on admission) On presentation.  Permethrin cream was applied x1 on 2/18.    Dysphagia Seen by speech and therapy.  Now on regular consistency diet.  Protein-calorie malnutrition, severe- (present on admission) Significant weight loss of around 100 pounds.  Continue Ensure supplementation on discharge.  Chronic diastolic CHF (congestive heart failure) ([place])- (present on admission) 2D echocardiogram from 2021  showed LV ejection fraction of  60 to 65% with grade 1 diastolic dysfunction.  Currently compensated.    Chronic diarrhea- (present on admission) History of chronic diarrhea and decreased oral intake.  Had been to [facility] in the past as per the patient's family.  Patient doesn't eat well because of this and has always been underweight as per the family..  Hyperlipidemia- (present on admission) Continue simvastatin.  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 (CVA)  Continue aspirin, statins.  Repeat CT head scan on 03/24/21 without acute findings.  Essential hypertension- (present on admission) Continue amlodipine hydralazine and Imdur on discharge.   Consultants: Orthopedics  Procedures performed: Right IM femoral nailing on 03/20/2021  Disposition: Skilled nursing facility spoke with the patient's daughter at bedside.  Diet recommendation:  Discharge Diet Orders (From admission, onward)     Start     Ordered   03/28/21 0000  Diet general        03/28/21 1051           Regular diet  DISCHARGE MEDICATION: Allergies as of 03/28/2021       Reactions   Trazodone And Nefazodone Nausea And Vomiting   Wellbutrin [bupropion] Nausea And Vomiting        Medication List     TAKE these medications    acetaminophen 325 MG tablet Commonly known as: TYLENOL Take 2 tablets (650 mg total) by mouth every 4 (four) hours as needed for mild pain (or temp > 37.5 C (99.5 F)).   amLODipine 10 MG tablet Commonly known as: NORVASC Take 1 tablet (10 mg total) by mouth daily. Needs office visit for additional refills   aspirin 81 MG EC tablet Take 1 tablet (81 mg total) by mouth daily.   budesonide 3 MG 24 hr capsule Commonly known as: ENTOCORT EC Take 3 capsules (9 mg total) by mouth daily.   docusate sodium 100 MG capsule Commonly known as: COLACE Take 1 capsule (100 mg total) by mouth 2 (two) times daily for 10 days.   enoxaparin 40 MG/0.4ML injection Commonly known as: LOVENOX Inject 0.4 mLs (40  mg total) into the skin daily for 30 doses. For 30 days post op for DVT prophylaxis   escitalopram 20 MG tablet Commonly known as: LEXAPRO TAKE 1 TABLET(20 MG) BY MOUTH DAILY. No additional refill without office visit What changed:  how much to take how to take this when to take this additional instructions   feeding supplement Liqd Take 237 mLs by mouth 3 (three) times daily between meals.   folic acid 1 MG tablet Commonly known as: FOLVITE Take 1 tablet (1 mg total) by mouth daily. Start taking on: March 29, 2021   hydrALAZINE 100 MG tablet Commonly known as: APRESOLINE Take 1 tablet (100 mg total) by mouth every 8 (eight) hours. What changed: when to take this   HYDROcodone-acetaminophen 5-325 MG tablet Commonly known as: Norco Take 1-2 tablets by mouth every 6 (six) hours as needed for moderate pain. MAXIMUM TOTAL ACETAMINOPHEN DOSE IS 4000 MG PER DAY   isosorbide mononitrate 30 MG 24 hr tablet Commonly known as: IMDUR TAKE 1 TABLET(30 MG) BY MOUTH DAILY What changed:  how much to take how to take this when to take this additional instructions   multivitamin with minerals Tabs tablet Take 1 tablet by mouth daily. Start taking on: March 29, 2021   polyethylene glycol 17 g packet Commonly known as: MIRALAX / GLYCOLAX Take 17 g by mouth daily as needed  for mild constipation or moderate constipation.   simvastatin 20 MG tablet Commonly known as: ZOCOR Take 1 tablet (20 mg total) by mouth daily at 6 PM. Needs office visit for additional refills   thiamine 100 MG tablet Take 1 tablet (100 mg total) by mouth daily. Start taking on: March 29, 2021   Vitamin D (Ergocalciferol) 1.25 MG (50000 UNIT) Caps capsule Commonly known as: DRISDOL Take 1 capsule (50,000 Units total) by mouth every 7 (seven) days for 4 doses.        Contact information for follow-up providers     Marchia Bond, MD. Schedule an appointment as soon as possible for a visit in 2  week(s).   Specialty: Orthopedic Surgery Contact information: [place] [address] [phone]              Contact information for after-discharge care     [facility] .   Service: Skilled Nursing Contact information: [place] [phone]                    Subjective Today, patient was seen and examined at bedside.  Appears alert awake and communicative.  Patient's daughter at bedside.  Has mild pain but no nausea vomiting fever chills or rigor.  Discharge Exam: Filed Weights   03/20/21 1529 03/23/21 0500  Weight: 43.1 kg 42.5 kg   Vitals with BMI 03/28/2021 03/28/2021 03/27/2021  Height - - -  Weight - - -  BMI - - -  Systolic 174 081 448  Diastolic 75 76 73  Pulse 75 73 74    General: Thinly built, not in obvious distress HENT:   No scleral pallor or icterus noted. Oral mucosa is moist.  Chest:  Clear breath sounds.  Diminished breath sounds bilaterally. No crackles or wheezes.  CVS: S1 &S2 heard. No murmur.  Regular rate and rhythm. Abdomen: Soft, nontender, nondistended.  Bowel sounds are heard.   Extremities: No cyanosis, clubbing or edema.  Peripheral pulses are palpable. Psych: Alert, awake and communicative, answering appropriately, CNS:  No cranial nerve deficits.  Power equal in all extremities.   Skin: Warm and dry.  Right thigh with area of ecchymosis.  Surgical site healthy.   Condition at discharge: good  The results of significant diagnostics from this hospitalization (including imaging, microbiology, ancillary and laboratory) are listed below for reference.   Imaging Studies: DG Chest 1 View  Result Date: 03/19/2021 CLINICAL DATA:  Fall and right femur fracture. EXAM: CHEST  1 VIEW COMPARISON:  Chest radiograph dated 09/17/2015. FINDINGS: No focal consolidation, pleural effusion, pneumothorax. The cardiac  silhouette is within normal limits. No acute osseous pathology. IMPRESSION: No active disease. Electronically Signed   By: Anner Crete M.D.   On: 03/19/2021 22:45   CT HEAD WO CONTRAST (5MM)  Result Date: 03/25/2021 CLINICAL DATA:  Acute metabolic encephalopathy. EXAM: CT HEAD WITHOUT CONTRAST TECHNIQUE: Contiguous axial images were obtained from the base of the skull through the vertex without intravenous contrast. RADIATION DOSE REDUCTION: This exam was performed according to the departmental dose-optimization program which includes automated exposure control, adjustment of the mA and/or kV according to patient size and/or use of iterative reconstruction technique. COMPARISON:  Head CT 03/19/2021 FINDINGS: Brain: Stable age advanced cerebral atrophy, mild ventriculomegaly and moderate periventricular white matter disease. Remote basal ganglia/periventricular white matter infarcts. No CT findings for acute hemispheric infarction or intracranial  hemorrhage. No mass lesions. The brainstem and cerebellum are grossly normal in stable. Vascular: Stable age advanced vascular calcifications but no aneurysm or hyperdense vessels. Skull: No skull fracture or bone lesions. Sinuses/Orbits: The paranasal sinuses and mastoid air cells are clear. The globes are intact. Other: No scalp lesions or scalp hematoma. IMPRESSION: 1. Stable age advanced cerebral atrophy, mild ventriculomegaly and moderate periventricular white matter disease. 2. Remote basal ganglia/periventricular white matter infarcts. 3. No acute intracranial findings or mass lesions. Electronically Signed   By: Marijo Sanes M.D.   On: 03/25/2021 08:34   CT HEAD WO CONTRAST (5MM)  Result Date: 03/20/2021 CLINICAL DATA:  Head trauma, minor (Age >= 65y) fall, bilateral hip fractures, EtOH on board EXAM: CT HEAD WITHOUT CONTRAST TECHNIQUE: Contiguous axial images were obtained from the base of the skull through the vertex without intravenous contrast.  RADIATION DOSE REDUCTION: This exam was performed according to the departmental dose-optimization program which includes automated exposure control, adjustment of the mA and/or kV according to patient size and/or use of iterative reconstruction technique. COMPARISON:  CT head 05/04/2019 BRAIN: BRAIN Patchy and confluent areas of decreased attenuation are noted throughout the deep and periventricular white matter of the cerebral hemispheres bilaterally, compatible with chronic microvascular ischemic disease. Chronic right corona radiata/basal ganglia infarction. No evidence of large-territorial acute infarction. No parenchymal hemorrhage. No mass lesion. No extra-axial collection. No mass effect or midline shift. No hydrocephalus. Basilar cisterns are patent. Vascular: No hyperdense vessel. Skull: No acute fracture or focal lesion. Sinuses/Orbits: Paranasal sinuses and mastoid air cells are clear. The orbits are unremarkable. Other: None. IMPRESSION: No acute intracranial abnormality. Electronically Signed   By: Iven Finn M.D.   On: 03/20/2021 00:15   CT FEMUR RIGHT W CONTRAST  Result Date: 03/24/2021 CLINICAL DATA:  New medial thigh swelling and redness. Proximal right femur ORIF 4 days ago EXAM: CT OF THE LOWER RIGHT EXTREMITY WITH CONTRAST TECHNIQUE: Multidetector CT imaging of the lower right extremity was performed according to the standard protocol following intravenous contrast administration. RADIATION DOSE REDUCTION: This exam was performed according to the departmental dose-optimization program which includes automated exposure control, adjustment of the mA and/or kV according to patient size and/or use of iterative reconstruction technique. CONTRAST:  32mL OMNIPAQUE IOHEXOL 300 MG/ML  SOLN COMPARISON:  X-ray 03/23/2021 FINDINGS: Bones/Joint/Cartilage Postsurgical changes of recent ORIF of the proximal right femur with short IM rod, lag screw and distal interlocking screw. Near anatomic alignment  of comminuted intertrochanteric proximal right femur fracture. Lesser trochanteric fragment is moderately displaced. Hip joint alignment is maintained without dislocation. There is a healing fracture involving the left greater trochanter with mild displacement. Ligaments Suboptimally assessed by CT. Muscles and Tendons No acute musculotendinous abnormality by CT. Soft tissue air within the right gluteal musculature is presumed postoperative. Soft tissues Prominent edema and ill-defined fluid within the medial subcutaneous tissues of the upper right thigh (series 6, image 104). No organized or rim enhancing collection. Expected postoperative changes within the soft tissues at the anterolateral aspect of the right hip. Small amount of residual soft tissue air at this location is presumed postsurgical. No lymphadenopathy. IMPRESSION: 1. Postsurgical changes of recent ORIF of the proximal right femur. 2. Prominent edema and ill-defined fluid within the medial subcutaneous tissues of the upper right thigh, concerning for soft tissue infection/cellulitis. No organized or rim-enhancing collection. 3. Healing fracture involving the left greater trochanter with mild displacement. Electronically Signed   By: Davina Poke D.O.   On: 03/24/2021  13:07   DG C-Arm 1-60 Min-No Report  Result Date: 03/20/2021 Fluoroscopy was utilized by the requesting physician.  No radiographic interpretation.   DG C-Arm 1-60 Min-No Report  Result Date: 03/20/2021 Fluoroscopy was utilized by the requesting physician.  No radiographic interpretation.   [place] RT LOWER EXTREM LTD SOFT TISSUE NON VASCULAR  Result Date: 03/23/2021 CLINICAL DATA:  Swelling of the right inner thigh. Recent placement of right femoral intramedullary nail on 03/20/2021. EXAM: ULTRASOUND RIGHT LOWER EXTREMITY LIMITED TECHNIQUE: Ultrasound examination of the lower extremity soft tissues was performed in the area of clinical concern. COMPARISON:  None. FINDINGS:  Targeted ultrasound of the inner upper right thigh, at the patient indicated area of clinical concern, demonstrates marked subcutaneous edema. A vague hypoechoic area measuring approximately 3.2 x 1.7 x 2.4 cm with internal vascularity is noted at the level of the musculature. Evaluation was suboptimal secondary to patient pain and difficulty with moving the right lower extremity. IMPRESSION: Subcutaneous edema and vague hypoechoic area measuring approximately 3.2 cm at the patient indicated area of concern in the inner right thigh. Findings are nonspecific with main differential considerations including hematoma or potentially early abscess formation in the appropriate clinical context. If there is clinical concern for active extravasation with hematoma formation, recommend CT of the right lower extremity with and without intravenous contrast. Electronically Signed   By: Ileana Roup M.D.   On: 03/23/2021 18:46   DG HIP OPERATIVE UNILAT W OR W/O PELVIS RIGHT  Result Date: 03/20/2021 CLINICAL DATA:  RIGHT hip repair EXAM: OPERATIVE RIGHT HIP (WITH PELVIS IF PERFORMED) 7 VIEWS TECHNIQUE: Fluoroscopic spot image(s) were submitted for interpretation post-operatively. 03/19/2021 COMPARISON:  Fluoroscopy time: 1 minute 25 seconds Dose: 4.5499 mGy FINDINGS: Intertrochanteric fracture RIGHT femur, nondisplaced. Osseous demineralization. Images demonstrate placement of an IM nail with a locking screw and a compression screw at the proximal RIGHT femur. No additional fracture or dislocation. IMPRESSION: Post ORIF intertrochanteric fracture RIGHT femur. Electronically Signed   By: [name] Dana M.D.   On: 03/20/2021 17:30   DG HIP UNILAT WITH PELVIS 2-3 VIEWS RIGHT  Result Date: 03/20/2021 CLINICAL DATA:  S/P RIGHT IM nail - images obtained bedside in PACU EXAM: DG HIP (WITH OR WITHOUT PELVIS) 2-3V RIGHT COMPARISON:  None. FINDINGS: Interval intramedullary nail fixation of a right intertrochanteric fracture. There is no  evidence of new acute hip fracture or dislocation. There is no evidence of severe arthropathy or other focal bone abnormality. IMPRESSION: Interval intramedullary nail fixation of a right intertrochanteric fracture. Electronically Signed   By: Iven Finn M.D.   On: 03/20/2021 18:46   DG Hip Unilat W or Wo Pelvis 2-3 Views Right  Result Date: 03/19/2021 CLINICAL DATA:  Fall, right thigh pain EXAM: DG HIP (WITH OR WITHOUT PELVIS) 2-3V RIGHT COMPARISON:  None. FINDINGS: Right intertrochanteric hip fracture with foreshortening and varus angulation. Mildly displaced left greater trochanter fracture. Bilateral hip joint spaces are preserved. Visualized bony pelvis appears intact. IMPRESSION: Right intertrochanteric hip fracture, as above. Mildly displaced left greater trochanter fracture. Electronically Signed   By: Julian Hy M.D.   On: 03/19/2021 22:45   DG Femur Min 2 Views Right  Result Date: 03/19/2021 CLINICAL DATA:  Fall, right thigh pain EXAM: RIGHT FEMUR 2 VIEWS COMPARISON:  None. FINDINGS: Intertrochanteric right hip fracture with foreshortening and varus angulation. No fracture or dislocation at the knee. IMPRESSION: Negative. Electronically Signed   By: Julian Hy M.D.   On: 03/19/2021 22:45   DG FEMUR PORT,  MIN 2 VIEWS RIGHT  Result Date: 03/23/2021 CLINICAL DATA:  Three days status post right intramedullary nail placement. New redness and swelling of the right inner thigh. EXAM: RIGHT FEMUR PORTABLE 2 VIEW COMPARISON:  Radiograph 03/20/2021 FINDINGS: Intramedullary nail fixation of right intertrochanteric fracture. Hardware is intact and in appropriate position, unchanged compared to postoperative radiograph on 03/20/2021. Interval development of ill-defined soft tissue density in the upper inner right thigh. No subcutaneous emphysema in this region. Resolving subcutaneous emphysema is noted in the lateral soft tissues of the right hip. IMPRESSION: 1. Interval development of  ill-defined soft tissue density in the upper inner right thigh, at the described location of new redness and swelling, is concerning for possible infection versus hematoma. 2. Right intramedullary nail fixation hardware is intact without evidence of complication. Electronically Signed   By: Ileana Roup M.D.   On: 03/23/2021 19:22    Microbiology: Results for orders placed or performed during the hospital encounter of 03/19/21  Resp Panel by RT-PCR (Flu A&B, Covid) Nasopharyngeal Swab     Status: None   Collection Time: 03/19/21 10:41 PM   Specimen: Nasopharyngeal Swab; Nasopharyngeal(NP) swabs in vial transport medium  Result Value Ref Range Status   SARS Coronavirus 2 by RT PCR NEGATIVE NEGATIVE Final    Comment: (NOTE) SARS-CoV-2 target nucleic acids are NOT DETECTED.  The SARS-CoV-2 RNA is generally detectable in upper respiratory specimens during the acute phase of infection. The lowest concentration of SARS-CoV-2 viral copies this assay can detect is 138 copies/mL. A negative result does not preclude SARS-Cov-2 infection and should not be used as the sole basis for treatment or other patient management decisions. A negative result may occur with  improper specimen collection/handling, submission of specimen other than nasopharyngeal swab, presence of viral mutation(s) within the areas targeted by this assay, and inadequate number of viral copies(<138 copies/mL). A negative result must be combined with clinical observations, patient history, and epidemiological information. The expected result is Negative.  Fact Sheet for Patients:  EntrepreneurPulse.com.au  Fact Sheet for Healthcare Providers:  IncredibleEmployment.be  This test is no t yet approved or cleared by the [place] FDA and  has been authorized for detection and/or diagnosis of SARS-CoV-2 by FDA under an Emergency Use Authorization (EUA). This EUA will remain  in effect  (meaning this test can be used) for the duration of the COVID-19 declaration under Section 564(b)(1) of the Act, 21 U.S.C.section 360bbb-3(b)(1), unless the authorization is terminated  or revoked sooner.       Influenza A by PCR NEGATIVE NEGATIVE Final   Influenza B by PCR NEGATIVE NEGATIVE Final    Comment: (NOTE) The Xpert Xpress SARS-CoV-2/FLU/RSV plus assay is intended as an aid in the diagnosis of influenza from Nasopharyngeal swab specimens and should not be used as a sole basis for treatment. Nasal washings and aspirates are unacceptable for Xpert Xpress SARS-CoV-2/FLU/RSV testing.  Fact Sheet for Patients: EntrepreneurPulse.com.au  Fact Sheet for Healthcare Providers: IncredibleEmployment.be  This test is not yet approved or cleared by the [place] FDA and has been authorized for detection and/or diagnosis of SARS-CoV-2 by FDA under an Emergency Use Authorization (EUA). This EUA will remain in effect (meaning this test can be used) for the duration of the COVID-19 declaration under Section 564(b)(1) of the Act, 21 U.S.C. section 360bbb-3(b)(1), unless the authorization is terminated or revoked.  Performed at [facility], [place] [address], [place], [place] [address]   Culture, blood (routine x 2)  Status: None (Preliminary result)   Collection Time: 03/24/21  3:54 PM   Specimen: BLOOD  Result Value Ref Range Status   Specimen Description   Final    BLOOD LEFT ANTECUBITAL Performed at [facility] [address], [place], [place] [address]    Special Requests   Final    BOTTLES DRAWN AEROBIC ONLY Blood Culture adequate volume Performed at [facility] [address], [place], [place] [address]    Culture   Final    NO GROWTH 4 DAYS Performed at [facility], [place] [address] [place], [place] [address]    Report Status PENDING  Incomplete  Culture, blood  (routine x 2)     Status: None (Preliminary result)   Collection Time: 03/24/21  4:10 PM   Specimen: BLOOD  Result Value Ref Range Status   Specimen Description   Final    BLOOD RIGHT ANTECUBITAL Performed at [facility] [address], [place], [place] [address]    Special Requests   Final    BOTTLES DRAWN AEROBIC ONLY Blood Culture results may not be optimal due to an excessive volume of blood received in culture bottles Performed at [facility] [address] [place],  [address]    Culture   Final    NO GROWTH 4 DAYS Performed at [facility], [place] [address] [place],  [address]    Report Status PENDING  Incomplete    Labs: CBC: Recent Labs  Lab 03/23/21 0634 03/24/21 0853 03/25/21 1014 03/26/21 1447 03/27/21 0732 03/28/21 0506  WBC 8.8 8.7 12.5* 12.4* 9.6 9.8  NEUTROABS 6.7  --   --   --   --   --   HGB 8.2* 9.6* 9.1* 8.1* 8.8* 8.7*  HCT 26.2* 29.8* 29.2* 24.8* 28.5* 27.7*  MCV 85.3 85.1 85.4 84.6 88.0 87.4  PLT 291 348 343 339 386 332*   Basic Metabolic Panel: Recent Labs  Lab 03/24/21 0853 03/25/21 0456 03/25/21 0559 03/26/21 0506 03/27/21 0732 03/28/21 0506  NA 137 137  --  138 138 140  K 3.5 2.6*  --  3.5 3.2* 4.1  CL 102 102  --  107 107 107  CO2 28 26  --  24 24 27   GLUCOSE 106* 85  --  121* 99 99  BUN 9 11  --  10 8 12   CREATININE 0.49 0.51  --  0.44 0.43* 0.50  CALCIUM 8.5* 8.3*  --  8.2* 8.0* 8.4*  MG  --   --  2.0 1.9 1.7  --    Liver Function Tests: No results for input(s): AST, ALT, ALKPHOS, BILITOT, PROT, ALBUMIN in the last 168 hours. CBG: No results for input(s): GLUCAP in the last 168 hours.  Discharge time spent: greater than 30 minutes.  Signed: Flora Lipps, MD [facility] 03/28/2021

## 2021-03-28 NOTE — Progress Notes (Signed)
Nutrition Follow-up  DOCUMENTATION CODES:   Severe malnutrition in context of chronic illness, Underweight  INTERVENTION:  - continue Ensure Plus High Protein TID, each supplement provides 350 kcal and 20 grams of protein. - will order Boost Breeze BID, each supplement provides 250 kcal and 9 grams of protein.   NUTRITION DIAGNOSIS:   Severe Malnutrition related to chronic illness (EtOH abuse) as evidenced by severe fat depletion, severe muscle depletion. -ongoing  GOAL:   Patient will meet greater than or equal to 90% of their needs -unmet, unable to meet d/t period of NPO status.   MONITOR:   PO intake, Supplement acceptance, Labs, Weight trends   ASSESSMENT:   66 yo female with a PMH of EtOH abuse, cannabis use, colitis, dCHF, HLD, HTN, cognitive deficit due to previous CVA, and depression who presents after a fall in her bedroom while intoxicated. Admitted with bilateral hip fractures.  Patient sitting in the chair. No visitors present at the time of RD visit, but she shares that one of her daughters was visiting her earlier this morning and her other daughter will be arriving this afternoon.  Patient did not eat breakfast and declined RD offer to get her something. She shares that lunch is to arrive at 1200 and is a sandwich and soup and that she has ordered a cheeseburger and fruit cup for dinner tonight.  She was drinking a few sips of coffee during conversation and an open bottle of Ensure was on the table. She reports that she has been drinking supplements despite them being a bit thick.   She denies abdominal pain or nausea today and indicates that she is looking forward to eating lunch.  She had been NPO from 2/21 morning until yesterday afternoon. SLP did bedside swallow evaluation and patient was then advanced to Regular, thin liquids.  She was weighed on admission date of 2/16 and again on 2/19 and weight was -1 lb between these dates.   Palliative Care following  and last saw her yesterday.    Labs reviewed; Ca: 8.4 mg/dl.  Medications reviewed; 100 mg colace BID, 1 mg folvite/day, 1 tablet multivitamin with minerals/day, 40 mEq Klor-Con x2 doses 2/23, 1 tablet senokot BID, 100 mg thiamine/day, 50000 units ergocalciferol every 7 days starting 2/17.   Diet Order:   Diet Order             Diet regular Room service appropriate? Yes; Fluid consistency: Thin  Diet effective now                   EDUCATION NEEDS:   Education needs have been addressed  Skin:  Skin Assessment: Skin Integrity Issues: Skin Integrity Issues:: Incisions Incisions: R hip, closed (2/16)  Last BM:  2/23 (type 5 x1, large amount)  Height:   Ht Readings from Last 1 Encounters:  03/20/21 5' 4.25" (1.632 m)    Weight:   Wt Readings from Last 1 Encounters:  03/23/21 42.5 kg    BMI:  Body mass index is 15.96 kg/m.  Estimated Nutritional Needs: Kcal:  1800-2000 Protein:  75-90 grams Fluid:  >1.8 L     Jarome Matin, MS, RD, LDN Inpatient Clinical Dietitian RD pager # available in [place]  After hours/weekend pager # available in [facility]

## 2021-03-28 NOTE — Progress Notes (Addendum)
Pt. Had not voided post FC removal. Bladder scan 21ml. Pt. Was encouraged to drink water and increase fluids. MD notified will continue to encourage bladder training.  @ 1840 latest bladder scan 145ml. Will continue to monitor.

## 2021-03-28 NOTE — Progress Notes (Signed)
Subjective: 8 Days Post-Op s/p Procedure(s): INTRAMEDULLARY (IM) NAIL FEMORAL  Significantly more awake this morning. Able to follow all commands and have full conversation with me. States she is still in pain, pain is "all over". Wanting to work with PT this morning. Daughter [name] in the room as well.   Objective:  PE: VITALS:   Vitals:   03/27/21 1330 03/27/21 2015 03/28/21 0500 03/28/21 0553  BP: 116/64 140/73 (!) 146/76 140/75  Pulse: 81 74 73 75  Resp: 18   20  Temp: 98.3 F (36.8 C) 98.3 F (36.8 C) 98.1 F (36.7 C)   TempSrc: Oral Oral Oral   SpO2: 100% 100%  99%  Weight:      Height:       General: laying in bed, in no acute distress MSK: 2+ DP and PT pulses bilaterally. Dorsiflexion and plantarflexion intact bilaterally. Sensation intact to all aspects of bilateral feet. Steri-strips intact bat right hip. No TTP to left greater trochanter. Able to flex and extend at bilateral knees and hips.    LABS  Results for orders placed or performed during the hospital encounter of 03/19/21 (from the past 24 hour(s))  CBC     Status: Abnormal   Collection Time: 03/28/21  5:06 AM  Result Value Ref Range   WBC 9.8 4.0 - 10.5 K/uL   RBC 3.17 (L) 3.87 - 5.11 MIL/uL   Hemoglobin 8.7 (L) 12.0 - 15.0 g/dL   HCT 27.7 (L) 36.0 - 46.0 %   MCV 87.4 80.0 - 100.0 fL   MCH 27.4 26.0 - 34.0 pg   MCHC 31.4 30.0 - 36.0 g/dL   RDW 15.7 (H) 11.5 - 15.5 %   Platelets 426 (H) 150 - 400 K/uL   nRBC 0.0 0.0 - 0.2 %  Basic metabolic panel     Status: Abnormal   Collection Time: 03/28/21  5:06 AM  Result Value Ref Range   Sodium 140 135 - 145 mmol/L   Potassium 4.1 3.5 - 5.1 mmol/L   Chloride 107 98 - 111 mmol/L   CO2 27 22 - 32 mmol/L   Glucose, Bld 99 70 - 99 mg/dL   BUN 12 8 - 23 mg/dL   Creatinine, Ser 0.50 0.44 - 1.00 mg/dL   Calcium 8.4 (L) 8.9 - 10.3 mg/dL   GFR, Estimated >60 >60 mL/min   Anion gap 6 5 - 15     No results found.  Assessment/Plan: Right  intertrochanteric hip fracture 8 Days Post-Op IM nail Left femur greater trochanter fracture - treating non-operatively right medial thigh swelling and redness  Acute blood loss Anemia- 2/17 10.4 > 2/19 8.2 > 2/20 9.6 > 2/21 9.1> 2/22 8.2 > 2/23 8.8 > 2/24 8.7 - post operative x-ray and [place] as above  - WBC trending down, antibiotics have been stopped - CT right femur looked ok. No drainable fluid collection. This does not appear to communicate to her surgical site. Likely hematoma as medial thigh looks ecchymotic but not threatening skin.   Weightbearing: WBAT BLE Insicional and dressing care: reinforce dressings as needed  VTE prophylaxis: Lovenox 40mg  qd x 30 days Pain control: continue current regimen Follow - up plan: 2 weeks with Dr. Mardelle Matte Dispo: PT recommending SNF placement. Reached out to PT to work with patient this morning as she is significantly more awake and cooperative, may be able to have a more productive session with her today. Okay to discharge from orthopedic standpoint when cleared by  medicine. Pain medication placed in chart and lovenox ordered for 30 days post op. Will sign off, please reach out if any further needs/concerns.   Contact information:   Weekdays 8-5 [address], [place] [phone] A fter hours and holidays please check [email or web address] for group call information for [facility]  Ventura Bruns 03/28/2021, 8:55 AM

## 2021-03-28 NOTE — Progress Notes (Signed)
RN called report to [facility] receiving nurse Madu received the report. Pt. Updated of the discharge plans, will continue to assist patient needs.

## 2021-03-28 NOTE — Telephone Encounter (Signed)
Pt needs f/u appointment. Rx prescribed over 1 year ago and patient has not been seen in over a year.

## 2021-03-29 LAB — CULTURE, BLOOD (ROUTINE X 2)
Culture: NO GROWTH
Culture: NO GROWTH
Special Requests: ADEQUATE

## 2021-03-31 DIAGNOSIS — E46 Unspecified protein-calorie malnutrition: Secondary | ICD-10-CM | POA: Diagnosis not present

## 2021-03-31 DIAGNOSIS — I10 Essential (primary) hypertension: Secondary | ICD-10-CM | POA: Diagnosis not present

## 2021-03-31 DIAGNOSIS — E785 Hyperlipidemia, unspecified: Secondary | ICD-10-CM | POA: Diagnosis not present

## 2021-03-31 DIAGNOSIS — S72001E Fracture of unspecified part of neck of right femur, subsequent encounter for open fracture type I or II with routine healing: Secondary | ICD-10-CM | POA: Diagnosis not present

## 2021-03-31 DIAGNOSIS — K529 Noninfective gastroenteritis and colitis, unspecified: Secondary | ICD-10-CM | POA: Diagnosis not present

## 2021-03-31 DIAGNOSIS — G9341 Metabolic encephalopathy: Secondary | ICD-10-CM | POA: Diagnosis not present

## 2021-03-31 DIAGNOSIS — W19XXXD Unspecified fall, subsequent encounter: Secondary | ICD-10-CM | POA: Diagnosis not present

## 2021-03-31 DIAGNOSIS — I5030 Unspecified diastolic (congestive) heart failure: Secondary | ICD-10-CM | POA: Diagnosis not present

## 2021-03-31 DIAGNOSIS — D62 Acute posthemorrhagic anemia: Secondary | ICD-10-CM | POA: Diagnosis not present

## 2021-03-31 DIAGNOSIS — K5900 Constipation, unspecified: Secondary | ICD-10-CM | POA: Diagnosis not present

## 2021-03-31 DIAGNOSIS — S72002E Fracture of unspecified part of neck of left femur, subsequent encounter for open fracture type I or II with routine healing: Secondary | ICD-10-CM | POA: Diagnosis not present

## 2021-04-02 ENCOUNTER — Other Ambulatory Visit: Payer: Self-pay | Admitting: *Deleted

## 2021-04-02 DIAGNOSIS — S72001D Fracture of unspecified part of neck of right femur, subsequent encounter for closed fracture with routine healing: Secondary | ICD-10-CM | POA: Diagnosis not present

## 2021-04-02 DIAGNOSIS — S72009A Fracture of unspecified part of neck of unspecified femur, initial encounter for closed fracture: Secondary | ICD-10-CM | POA: Diagnosis not present

## 2021-04-02 DIAGNOSIS — D62 Acute posthemorrhagic anemia: Secondary | ICD-10-CM | POA: Diagnosis not present

## 2021-04-02 DIAGNOSIS — G9340 Encephalopathy, unspecified: Secondary | ICD-10-CM | POA: Diagnosis not present

## 2021-04-02 DIAGNOSIS — I639 Cerebral infarction, unspecified: Secondary | ICD-10-CM | POA: Diagnosis not present

## 2021-04-02 NOTE — Patient Outreach (Signed)
Per [facility] eligible member resides in [facility].  Screened for potential [facility] Care Management services as a benefit of member's insurance plan. ? ?Stacie [name] admitted to SNF on 03/28/21 after hospitalization. ? ?Facility site visit to [facility] skilled nursing facility. Met with Stacie [name], SNF SW to make aware writer is following for transition plans and potential [facility] care coordination services.  ? ?Member's PCP at [facility] [place] has [facility] Embedded care coordination team. Stacie [name] appears to be active with CCM services provided by the Pharmacist at PCP office.  ? ?Went to speak with Stacie [name] in her room at SNF. However, NP at bedside. Will follow up at later time.  ? ?Will continue to follow transition plans/needs while member resides in SNF.  ? ? ? , MSN, RN,BSN ?[facility] Post Acute Care Coordinator ?[phone] ( [employer]) ?[phone]  (Toll free office)  ? ? ? ?  ?

## 2021-04-07 DIAGNOSIS — S72001E Fracture of unspecified part of neck of right femur, subsequent encounter for open fracture type I or II with routine healing: Secondary | ICD-10-CM | POA: Diagnosis not present

## 2021-04-07 DIAGNOSIS — I10 Essential (primary) hypertension: Secondary | ICD-10-CM | POA: Diagnosis not present

## 2021-04-07 DIAGNOSIS — D62 Acute posthemorrhagic anemia: Secondary | ICD-10-CM | POA: Diagnosis not present

## 2021-04-07 DIAGNOSIS — S72002E Fracture of unspecified part of neck of left femur, subsequent encounter for open fracture type I or II with routine healing: Secondary | ICD-10-CM | POA: Diagnosis not present

## 2021-04-07 DIAGNOSIS — I5030 Unspecified diastolic (congestive) heart failure: Secondary | ICD-10-CM | POA: Diagnosis not present

## 2021-04-09 DIAGNOSIS — N390 Urinary tract infection, site not specified: Secondary | ICD-10-CM | POA: Diagnosis not present

## 2021-04-10 DIAGNOSIS — S72144D Nondisplaced intertrochanteric fracture of right femur, subsequent encounter for closed fracture with routine healing: Secondary | ICD-10-CM | POA: Diagnosis not present

## 2021-04-11 DIAGNOSIS — S72002E Fracture of unspecified part of neck of left femur, subsequent encounter for open fracture type I or II with routine healing: Secondary | ICD-10-CM | POA: Diagnosis not present

## 2021-04-11 DIAGNOSIS — N390 Urinary tract infection, site not specified: Secondary | ICD-10-CM | POA: Diagnosis not present

## 2021-04-11 DIAGNOSIS — I5030 Unspecified diastolic (congestive) heart failure: Secondary | ICD-10-CM | POA: Diagnosis not present

## 2021-04-11 DIAGNOSIS — S72001E Fracture of unspecified part of neck of right femur, subsequent encounter for open fracture type I or II with routine healing: Secondary | ICD-10-CM | POA: Diagnosis not present

## 2021-04-11 DIAGNOSIS — I10 Essential (primary) hypertension: Secondary | ICD-10-CM | POA: Diagnosis not present

## 2021-04-14 ENCOUNTER — Other Ambulatory Visit: Payer: Self-pay | Admitting: *Deleted

## 2021-04-14 DIAGNOSIS — N390 Urinary tract infection, site not specified: Secondary | ICD-10-CM | POA: Diagnosis not present

## 2021-04-14 DIAGNOSIS — I10 Essential (primary) hypertension: Secondary | ICD-10-CM | POA: Diagnosis not present

## 2021-04-14 DIAGNOSIS — S72001E Fracture of unspecified part of neck of right femur, subsequent encounter for open fracture type I or II with routine healing: Secondary | ICD-10-CM | POA: Diagnosis not present

## 2021-04-14 DIAGNOSIS — S72002E Fracture of unspecified part of neck of left femur, subsequent encounter for open fracture type I or II with routine healing: Secondary | ICD-10-CM | POA: Diagnosis not present

## 2021-04-14 DIAGNOSIS — I5030 Unspecified diastolic (congestive) heart failure: Secondary | ICD-10-CM | POA: Diagnosis not present

## 2021-04-14 NOTE — Patient Outreach (Signed)
[facility]N Post- Acute Care Coordinator follow up.  ? ?Verified in [place] that Ms. Speranza transitioned from [facility] to home on 04/14/21. ? ?Member's PCP at [facility] has [facility] care coordination services available if needed post SNF. Ms. Dolle is active with CCM services with Pharmacist at PCP office.   ? ?Will send notification to Pharmacist at [facility] to make aware of member's discharge from SNF.  ? ? ? ? ?Marthenia Rolling, MSN, RN,BSN ?[facility] Coordinator ?[phone] [facility]) ?[phone]  (Toll free office)   ?

## 2021-04-15 NOTE — Progress Notes (Signed)
? ?Established Patient Office Visit ? ?Subjective:  ?Patient ID: Stacie [name], female    DOB: March 19, 1955  Age: 66 y.o. MRN: [id number] ? ?CC:  ?Chief Complaint  ?Patient presents with  ? Hospitalization Follow-up  ?  Hosp f/u fractured hips and femur  ? ? ?HPI ?Stacie [name] presents for follow-up after hospitalization for bilateral hip fractures after a fall. She had right IM nail done on 03/20/21 and her left hip fracture is being treated non-operatively. She was discharged to skilled nursing facility on 03/28/21 and then discharged home on 04/14/21.  ? ?She has been doing well overall since being discharged from the SNF.  She still has pain in her hips however it varies depending on her activity.  She usually takes a hydrocodone/Tylenol in the morning and 1 in the evening.  She has not heard from home health PT about scheduling a time to come out.  She has a follow-up appointment with ortho on March 29. She has a walker and bedside commode at home. Most of the time she can dress herself, can go to the bathroom, however she needs some assistance with bathing. She endorses that she had delirium in the hospital and she does not remember falling or going to the hospital.  She denies trouble eating, nuasea, vomiting, chest pain, shortness of breath, dizziness.   ? ?Transition of Care Hospital Follow up.  ? ?Hospital/Facility: [facility] ?D/C Physician: Dr. Corrie Mckusick Pokhrel ?D/C Date: 03/28/21 from [facility], 04/14/21 from [facility] ? ?Records Requested: 04/16/21 ?Records Received: 04/16/21 ?Records Reviewed: 04/16/21 ? ?" ?Admit date:     03/19/2021  ?Discharge date: 03/28/21  ?Discharge Physician: Corrie Mckusick Pokhrel  ?  ?PCP: Stacie Buffy, NP  ?  ?Recommendations at discharge:  ?  ?Follow-up with your primary care provider at the skilled nursing facility in 3 to 5 days.  Check blood work at that time. ?Follow-up with Dr. Mardelle Matte with orthopedics in 2  weeks for postsurgical follow-up. ?  ?Discharge Diagnoses: ?Principal Problem: ?  Bilateral hip fractures, closed, initial encounter ([place]) ?Active Problems: ?  Hypokalemia ?  Acute metabolic encephalopathy ?  Alcohol abuse ?  Acute blood loss anemia ?  Essential hypertension ?  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  Hyperlipidemia ?  Chronic diarrhea ?  Chronic diastolic CHF (congestive heart failure) ([place]) ?  Protein-calorie malnutrition, severe ?  Dysphagia ?  Scabies ?  Debility ?  ?Resolved Problems: ?  Cellulitis of right thigh ?  ?  ?Hospital Course: ?Stacie [name] is a 66 y.o. female with past medical history of hypertension, hyperlipidemia, stroke, diastolic heart failure, depression and noncompliance of medication presented to the ED after sustaining a fall while under the influence of alcohol leading to right hip pain and difficulty ambulation.  In the ED patient was hemodynamically stable.  Initial labs showed a potassium of 2.9 with WBC elevated at 11.5.  Right x-ray of the hips hip x-ray showed intertrochanteric right hip fracture with foreshortening and varus angulation as well as mildly displaced left greater trochanter fracture. CT head was unremarkable. Orthopedics was consulted from ED and patient was admitted to the hospital for further evaluation and treatment..  Patient underwent IM nailing of the right femur on 03/20/2021 but postoperatively had episodes of confusion encephalopathy and dysphagia.  This has subsequently improved at this time. ?  ?  ?Assessment and Plan: ?* Bilateral hip fractures, closed, initial encounter [facility])- (present on admission) ?Secondary  to mechanical fall under the influence of alcohol.  Patient underwent right femoral intramedullary nail by orthopedic surgery on 03/20/2021.  Continue Lovenox 40 mg daily for 30 days for DVT prophylaxis.  Continue PT/ OT.  PT recommends skilled nursing facility placement on discharge.  Patient to  follow-up with Dr. Fabian November orthopedic surgery in 2 weeks. ?  ?Acute metabolic encephalopathy ?Improved at this time.  Thought to be secondary to hospital induced delirium, postoperative status, analgesia, volume depletion on the background of baseline cognitive dysfunction.   Continue thiamine and folic acid on discharge.  Patient is afebrile. ?  ?Hypokalemia- (present on admission) ?Resolved after replacement.  Latest potassium of 4.1 ?  ?Alcohol abuse- (present on admission) ?Received CIWA protocol.  Alcohol level was 111 on admission.  No signs of withdrawal at this time. ?  ?Cellulitis of right thigh-resolved as of 03/27/2021 ?Possible mild cellulitis/dermatitis.  Received Rocephin.  Antibiotics were discontinued ?  ?Acute blood loss anemia ?Postoperative.  Latest hemoglobin of 8.7.  Hemoglobin remained stable.  Patient did not require any blood transfusion.  Patient will continue on Folic acid discharge ?  ?Debility ? deconditioning, generalized weakness.   ?Physical therapy has recommended skilled nursing facility placement at this time. ?  ?Scabies- (present on admission) ?On presentation.  Permethrin cream was applied x1 on 2/18.   ?  ?Dysphagia ?Seen by speech and therapy.  Now on regular consistency diet. ?  ?Protein-calorie malnutrition, severe- (present on admission) ?Significant weight loss of around 100 pounds.  Continue Ensure supplementation on discharge. ?  ?Chronic diastolic CHF (congestive heart failure) ([place])- (present on admission) ?2D echocardiogram from 2021 showed LV ejection fraction of 60 to 65% with grade 1 diastolic dysfunction.  Currently compensated.   ?  ?Chronic diarrhea- (present on admission) ?History of chronic diarrhea and decreased oral intake.  Had been to MD in the past as per the patient's family.  Patient doesn't eat well because of this and has always been underweight as per the family.. ?  ?Hyperlipidemia- (present on admission) ?Continue simvastatin. ?  ?Cognitive deficit  due to old cerebrovascular accident (CVA) ? Continue aspirin, statins.  Repeat CT head scan on 03/24/21 without acute findings. ?  ?Essential hypertension- (present on admission) ?Continue amlodipine hydralazine and Imdur on discharge. ?  ?  ?Consultants: Orthopedics ?  ?Procedures performed: Right IM femoral nailing on 03/20/2021 ?  ?Disposition: Skilled nursing facility spoke with the patient's daughter at bedside." ? ? ?Diagnoses on Discharge: Bilateral hip fractures, acute blood loss anemia, acute metabolic encephalopathy, alcohol abuse, scabies ? ?Date of interactive Contact within 48 hours of discharge: 04/16/21 ?Contact was through: direct ? ?Date of 7 day or 14 day face-to-face visit:    within 7 days ? ?Outpatient Encounter Medications as of 04/16/2021  ?Medication Sig Note  ? permethrin (ELIMITE) 5 % cream Apply 1 application. topically once for 1 dose.   ? acetaminophen (TYLENOL) 325 MG tablet Take 2 tablets (650 mg total) by mouth every 4 (four) hours as needed for mild pain (or temp > 37.5 C (99.5 F)).   ? amLODipine (NORVASC) 10 MG tablet Take 1 tablet (10 mg total) by mouth daily. Needs office visit for additional refills   ? aspirin EC 81 MG EC tablet Take 1 tablet (81 mg total) by mouth daily.   ? enoxaparin (LOVENOX) 40 MG/0.4ML injection Inject 0.4 mLs (40 mg total) into the skin daily for 30 doses. For 30 days post op for DVT prophylaxis   ? escitalopram (  LEXAPRO) 20 MG tablet TAKE 1 TABLET(20 MG) BY MOUTH DAILY. No additional refill without office visit (Patient taking differently: Take 20 mg by mouth daily. No additional refill without office visit)   ? feeding supplement (ENSURE ENLIVE / ENSURE PLUS) LIQD Take 237 mLs by mouth 3 (three) times daily between meals.   ? folic acid (FOLVITE) 1 MG tablet Take 1 tablet (1 mg total) by mouth daily.   ? hydrALAZINE (APRESOLINE) 100 MG tablet Take 1 tablet (100 mg total) by mouth every 8 (eight) hours. (Patient taking differently: Take 100 mg by mouth  daily at 12 noon.)   ? HYDROcodone-acetaminophen (NORCO) 5-325 MG tablet Take 1 tablet by mouth every 6 (six) hours as needed for moderate pain. MAXIMUM TOTAL ACETAMINOPHEN DOSE IS 4000 MG PER DAY   ? isosorbide

## 2021-04-16 ENCOUNTER — Ambulatory Visit (INDEPENDENT_AMBULATORY_CARE_PROVIDER_SITE_OTHER): Payer: Medicare Other | Admitting: Nurse Practitioner

## 2021-04-16 ENCOUNTER — Encounter: Payer: Self-pay | Admitting: Nurse Practitioner

## 2021-04-16 ENCOUNTER — Other Ambulatory Visit: Payer: Self-pay

## 2021-04-16 VITALS — BP 115/59 | HR 74 | Temp 97.0°F | Wt 83.2 lb

## 2021-04-16 DIAGNOSIS — B86 Scabies: Secondary | ICD-10-CM

## 2021-04-16 DIAGNOSIS — Z8781 Personal history of (healed) traumatic fracture: Secondary | ICD-10-CM

## 2021-04-16 DIAGNOSIS — Z780 Asymptomatic menopausal state: Secondary | ICD-10-CM

## 2021-04-16 DIAGNOSIS — E559 Vitamin D deficiency, unspecified: Secondary | ICD-10-CM | POA: Diagnosis not present

## 2021-04-16 DIAGNOSIS — D649 Anemia, unspecified: Secondary | ICD-10-CM | POA: Diagnosis not present

## 2021-04-16 DIAGNOSIS — Z09 Encounter for follow-up examination after completed treatment for conditions other than malignant neoplasm: Secondary | ICD-10-CM

## 2021-04-16 LAB — COMPREHENSIVE METABOLIC PANEL
ALT: 15 U/L (ref 0–35)
AST: 19 U/L (ref 0–37)
Albumin: 4.3 g/dL (ref 3.5–5.2)
Alkaline Phosphatase: 155 U/L — ABNORMAL HIGH (ref 39–117)
BUN: 17 mg/dL (ref 6–23)
CO2: 29 mEq/L (ref 19–32)
Calcium: 9.9 mg/dL (ref 8.4–10.5)
Chloride: 101 mEq/L (ref 96–112)
Creatinine, Ser: 0.82 mg/dL (ref 0.40–1.20)
GFR: 74.84 mL/min (ref >60.00)
Glucose, Bld: 101 mg/dL — ABNORMAL HIGH (ref 70–99)
Potassium: 4.2 mEq/L (ref 3.5–5.1)
Sodium: 138 mEq/L (ref 135–145)
Total Bilirubin: 0.3 mg/dL (ref 0.2–1.2)
Total Protein: 6.7 g/dL (ref 6.0–8.3)

## 2021-04-16 LAB — CBC WITH DIFFERENTIAL/PLATELET
Basophils Absolute: 0 10*3/uL (ref 0.0–0.1)
Basophils Relative: 0.7 % (ref 0.0–3.0)
Eosinophils Absolute: 0.1 10*3/uL (ref 0.0–0.7)
Eosinophils Relative: 1.2 % (ref 0.0–5.0)
HCT: 37.9 % (ref 36.0–46.0)
Hemoglobin: 12.1 g/dL (ref 12.0–15.0)
Lymphocytes Relative: 20.7 % (ref 12.0–46.0)
Lymphs Abs: 1.4 10*3/uL (ref 0.7–4.0)
MCHC: 31.8 g/dL (ref 30.0–36.0)
MCV: 86.2 fl (ref 78.0–100.0)
Monocytes Absolute: 0.5 10*3/uL (ref 0.1–1.0)
Monocytes Relative: 7.1 % (ref 3.0–12.0)
Neutro Abs: 4.8 10*3/uL (ref 1.4–7.7)
Neutrophils Relative %: 70.3 % (ref 43.0–77.0)
Platelets: 301 10*3/uL (ref 150.0–400.0)
RBC: 4.4 Mil/uL (ref 3.87–5.11)
RDW: 16.8 % — ABNORMAL HIGH (ref 11.5–15.5)
WBC: 6.8 10*3/uL (ref 4.0–10.5)

## 2021-04-16 LAB — VITAMIN D 25 HYDROXY (VIT D DEFICIENCY, FRACTURES): VITD: 69.45 ng/mL (ref 30.00–100.00)

## 2021-04-16 MED ORDER — PERMETHRIN 5 % EX CREA: 1.0000 "application " | TOPICAL_CREAM | Freq: Once | CUTANEOUS | 0 refills | Status: AC

## 2021-04-16 MED ORDER — HYDROCODONE-ACETAMINOPHEN 5-325 MG PO TABS: 1.0000 | ORAL_TABLET | Freq: Four times a day (QID) | ORAL | 0 refills | Status: DC | PRN

## 2021-04-16 NOTE — Patient Instructions (Signed)
It was great to see you! ? ?I am glad you are doing well after your discharge from the hospital.  I will follow-up on PT/OT and make sure that they come out to your house. ? ?I am refilling your pain medication for 7 days.  Try to start decreasing on the amount that you use by taking 1 tablet at night and taking plain Tylenol 1000 mg in the morning.  The medication may make you sleepy and unsteady on your feet.  Take MiraLAX as needed for constipation. ? ?I have sent in a refill on your permethrin cream. ? ?You can stop your budesonide tablet. ? ?Keep checking your blood pressure at home daily. ? ?Keep your appointment with orthopedics at the end of this month. ? ?I placed an order for a DEXA scan that I want you to have done in 2 months.  This will look at your bones and make sure you do not have any thinning.  Your mammogram can be done at the same time if you are able to do it then. ? ?Let's follow-up in 3 months, sooner if you have concerns. ? ?If a referral was placed today, you will be contacted for an appointment. Please note that routine referrals can sometimes take up to 3-4 weeks to process. Please call our office if you haven't heard anything after this time frame. ? ?Take care, ? ?Vance Peper, NP ? ?

## 2021-04-16 NOTE — Assessment & Plan Note (Addendum)
She had a fall with bilateral hip fracture and was admitted to [facility].  She had IM nail to right hip on 03/20/2021.  She had hospital onset delirium in the hospital, however that has cleared.  She was discharged to [facility] skilled nursing facility where she completed physical therapy, Occupational Therapy and was discharged home on 04/14/21.  Her daughter is a Marine scientist who is helping to care for her at home.  She has a walker and a bedside commode.  She states that she is able to dress herself and go to the bathroom by herself, however she sometimes needs help with bathing and cooking.  Home health PT/OT were supposed to be set up on discharge from SNF, however she has not heard from them.  We will place a referral to home health PT OT today.  With her age, low weight, and hip fractures, will check DEXA scan in 2 months.  Order placed today.  Make sure she keeps her follow-up appointment with orthopedics.  Continue Lovenox injections until she has follow-up with Ortho.  She is questing a refill on her pain medication.  PDMP reviewed.  Discussed that it may make her sleepy, have unsteady gait.  Also discussed trying to taper down on the medication.  Encouraged her to take it only at night and try to take Tylenol 1000 mg in the morning.  We will send in a refill for 7 days of hydrocodone-Tylenol 5 mg - 325 mg every 6 hours as needed pain.  Follow-up in 3 months or sooner with any concerns. ?

## 2021-04-16 NOTE — Assessment & Plan Note (Signed)
Vitamin D was noted to be low a month ago in the hospital.  She was started on vitamin D supplement 50,000 units weekly.  We will check vitamin D levels today and continue supplement based on results. ?

## 2021-04-23 ENCOUNTER — Telehealth: Payer: Self-pay | Admitting: Nurse Practitioner

## 2021-04-23 DIAGNOSIS — S72144D Nondisplaced intertrochanteric fracture of right femur, subsequent encounter for closed fracture with routine healing: Secondary | ICD-10-CM | POA: Diagnosis not present

## 2021-04-23 NOTE — Telephone Encounter (Signed)
Stacie [name] from [facility] is needing verbal orders for PT 2times/4weeks. Please give Stacie [name] a call at [phone]. ?

## 2021-04-23 NOTE — Telephone Encounter (Signed)
Verbal orders given  

## 2021-04-24 DIAGNOSIS — S72144D Nondisplaced intertrochanteric fracture of right femur, subsequent encounter for closed fracture with routine healing: Secondary | ICD-10-CM | POA: Diagnosis not present

## 2021-04-28 ENCOUNTER — Telehealth: Payer: Self-pay | Admitting: Nurse Practitioner

## 2021-04-28 NOTE — Telephone Encounter (Signed)
Called and lvm, due to type of medication, pt will need to f/u with her pcp which is Stacie [name] for further evaluation and any refills on this medication. Sw, cma ?

## 2021-04-28 NOTE — Telephone Encounter (Signed)
Pt saw Lauren last and is requesting a refill on the Hydrocodone.  ?

## 2021-04-29 DIAGNOSIS — S72144D Nondisplaced intertrochanteric fracture of right femur, subsequent encounter for closed fracture with routine healing: Secondary | ICD-10-CM | POA: Diagnosis not present

## 2021-04-29 MED ORDER — HYDROCODONE-ACETAMINOPHEN 5-325 MG PO TABS: 1.0000 | ORAL_TABLET | Freq: Three times a day (TID) | ORAL | 0 refills | Status: DC | PRN

## 2021-04-29 NOTE — Telephone Encounter (Signed)
Patient notified and verbalized understanding ?Pt states she has been trying to taper off and she does have a appointment with Ortho tomorrow.  ?

## 2021-04-29 NOTE — Telephone Encounter (Signed)
LVM for patient to return call. 

## 2021-04-30 DIAGNOSIS — S72001D Fracture of unspecified part of neck of right femur, subsequent encounter for closed fracture with routine healing: Secondary | ICD-10-CM | POA: Diagnosis not present

## 2021-05-06 DIAGNOSIS — S72144D Nondisplaced intertrochanteric fracture of right femur, subsequent encounter for closed fracture with routine healing: Secondary | ICD-10-CM | POA: Diagnosis not present

## 2021-05-08 DIAGNOSIS — S72144D Nondisplaced intertrochanteric fracture of right femur, subsequent encounter for closed fracture with routine healing: Secondary | ICD-10-CM | POA: Diagnosis not present

## 2021-05-13 DIAGNOSIS — S72144D Nondisplaced intertrochanteric fracture of right femur, subsequent encounter for closed fracture with routine healing: Secondary | ICD-10-CM | POA: Diagnosis not present

## 2021-05-15 DIAGNOSIS — S72144D Nondisplaced intertrochanteric fracture of right femur, subsequent encounter for closed fracture with routine healing: Secondary | ICD-10-CM | POA: Diagnosis not present

## 2021-05-23 DIAGNOSIS — S72144D Nondisplaced intertrochanteric fracture of right femur, subsequent encounter for closed fracture with routine healing: Secondary | ICD-10-CM | POA: Diagnosis not present

## 2021-06-02 ENCOUNTER — Other Ambulatory Visit: Payer: Self-pay | Admitting: Nurse Practitioner

## 2021-06-02 DIAGNOSIS — I10 Essential (primary) hypertension: Secondary | ICD-10-CM

## 2021-06-04 NOTE — Telephone Encounter (Signed)
Chart supports Rx 

## 2021-06-07 ENCOUNTER — Other Ambulatory Visit: Payer: Self-pay | Admitting: Nurse Practitioner

## 2021-06-07 DIAGNOSIS — F3342 Major depressive disorder, recurrent, in full remission: Secondary | ICD-10-CM

## 2021-06-07 DIAGNOSIS — F411 Generalized anxiety disorder: Secondary | ICD-10-CM

## 2021-06-13 ENCOUNTER — Telehealth: Payer: Self-pay | Admitting: Nurse Practitioner

## 2021-06-13 NOTE — Telephone Encounter (Signed)
Left message for patient to call back and schedule Medicare Annual Wellness Visit (AWV).  ? ?Please offer to do virtually or by telephone.  Left office number and my jabber [phone]. ? ?Last AWV:02/06/2020 ? ?Please schedule at anytime with Nurse Health Advisor. ?  ?

## 2021-06-19 ENCOUNTER — Ambulatory Visit: Payer: Medicare Other

## 2021-07-01 ENCOUNTER — Telehealth: Payer: Self-pay | Admitting: Nurse Practitioner

## 2021-07-01 NOTE — Telephone Encounter (Signed)
Left message for patient to call back and schedule Medicare Annual Wellness Visit (AWV). Please offer to do virtually or by telephone.  Left office number and my jabber #[phone]. ? ?Due for AWVI ? ?Please schedule at anytime with Nurse Health Advisor. ?  ?

## 2021-07-16 ENCOUNTER — Ambulatory Visit (INDEPENDENT_AMBULATORY_CARE_PROVIDER_SITE_OTHER): Payer: Medicare Other

## 2021-07-16 DIAGNOSIS — Z1231 Encounter for screening mammogram for malignant neoplasm of breast: Secondary | ICD-10-CM

## 2021-07-16 DIAGNOSIS — Z0000 Encounter for general adult medical examination without abnormal findings: Secondary | ICD-10-CM | POA: Diagnosis not present

## 2021-07-16 NOTE — Progress Notes (Signed)
Subjective:   Stacie [name] is a 66 y.o. female who presents for Medicare Annual (Subsequent) preventive examination.   I connected with Stacie [name] today by telephone and verified that I am speaking with the correct person using two identifiers. Location patient: home Location provider: work Persons participating in the virtual visit: patient, provider.   I discussed the limitations, risks, security and privacy concerns of performing an evaluation and management service by telephone and the availability of in person appointments. I also discussed with the patient that there may be a patient responsible charge related to this service. The patient expressed understanding and verbally consented to this telephonic visit.    Interactive audio and video telecommunications were attempted between this provider and patient, however failed, due to patient having technical difficulties OR patient did not have access to video capability.  We continued and completed visit with audio only.    Review of Systems     Cardiac Risk Factors include: advanced age (>18mn, >>35women);dyslipidemia     Objective:    Today's Vitals   There is no height or weight on file to calculate BMI.     07/16/2021   12:55 PM 03/20/2021    2:57 PM 02/06/2020   12:49 PM 08/23/2019    8:26 AM 05/29/2019    2:12 PM 05/29/2019    2:11 PM 05/12/2019    4:40 PM  Advanced Directives  Does Patient Have a Medical Advance Directive? Yes Unable to assess, patient is non-responsive or altered mental status Yes No No No No  Type of Advance Directive Healthcare Power of Attorney        Does patient want to make changes to medical advance directive?   Yes (MAU/Ambulatory/Procedural Areas - Information given)      Copy of [place]? No - copy requested        Would patient like information on creating a medical advance directive?    No - Patient declined No - Patient declined No - Patient  declined No - Patient declined    Current Medications (verified) Outpatient Encounter Medications as of 07/16/2021  Medication Sig   acetaminophen (TYLENOL) 325 MG tablet Take 2 tablets (650 mg total) by mouth every 4 (four) hours as needed for mild pain (or temp > 37.5 C (99.5 F)).   amLODipine (NORVASC) 10 MG tablet TAKE 1 TABLET(10 MG) BY MOUTH DAILY   aspirin EC 81 MG EC tablet Take 1 tablet (81 mg total) by mouth daily.   escitalopram (LEXAPRO) 20 MG tablet TAKE 1 TABLET(20 MG) BY MOUTH DAILY. No additional refill without office visit (Patient taking differently: Take 20 mg by mouth daily. No additional refill without office visit)   folic acid (FOLVITE) 1 MG tablet Take 1 tablet (1 mg total) by mouth daily.   hydrALAZINE (APRESOLINE) 100 MG tablet Take 1 tablet (100 mg total) by mouth every 8 (eight) hours. (Patient taking differently: Take 100 mg by mouth daily at 12 noon.)   isosorbide mononitrate (IMDUR) 30 MG 24 hr tablet TAKE 1 TABLET(30 MG) BY MOUTH DAILY (Patient taking differently: Take 30 mg by mouth daily.)   Multiple Vitamin (MULTIVITAMIN WITH MINERALS) TABS tablet Take 1 tablet by mouth daily.   polyethylene glycol (MIRALAX / GLYCOLAX) 17 g packet Take 17 g by mouth daily as needed for mild constipation or moderate constipation.   simvastatin (ZOCOR) 20 MG tablet Take 1 tablet (20 mg total) by mouth daily at 6 PM. Needs  office visit for additional refills   enoxaparin (LOVENOX) 40 MG/0.4ML injection Inject 0.4 mLs (40 mg total) into the skin daily for 30 doses. For 30 days post op for DVT prophylaxis   HYDROcodone-acetaminophen (NORCO) 5-325 MG tablet Take 1 tablet by mouth every 8 (eight) hours as needed for moderate pain. MAXIMUM TOTAL ACETAMINOPHEN DOSE IS 4000 MG PER DAY (Patient not taking: Reported on 07/16/2021)   thiamine 100 MG tablet Take 1 tablet (100 mg total) by mouth daily. (Patient not taking: Reported on 07/16/2021)   No facility-administered encounter medications  on file as of 07/16/2021.    Allergies (verified) Trazodone and nefazodone and Wellbutrin [bupropion]   History: Past Medical History:  Diagnosis Date   Colitis    Hyperlipidemia    Hypertension    Stroke ([place]) 04/2012   unknown type   Thyroid disease    Past Surgical History:  Procedure Laterality Date   FEMUR IM NAIL Right 03/20/2021   Procedure: INTRAMEDULLARY (IM) NAIL FEMORAL;  Surgeon: Marchia Bond, MD;  Location: [facility];  Service: Orthopedics;  Laterality: Right;   Family History  Problem Relation Age of Onset   Stroke Mother 56       embolic   Heart disease Father 55       CAd with CABG, no MI   Heart disease Sister 17       CAD   Heart disease Paternal Uncle    Heart disease Maternal Grandmother    Heart disease Paternal Grandfather    Colon cancer Neg Hx    Esophageal cancer Neg Hx    Stomach cancer Neg Hx    Social History   Socioeconomic History   Marital status: Divorced    Spouse name: Not on file   Number of children: Not on file   Years of education: Not on file   Highest education level: Not on file  Occupational History   Occupation: retired  Tobacco Use   Smoking status: Former    Packs/day: 0.25    Years: 10.00    Total pack years: 2.50    Types: Cigarettes    Quit date: 05/2019    Years since quitting: 2.2   Smokeless tobacco: Never  Vaping Use   Vaping Use: Never used  Substance and Sexual Activity   Alcohol use: Yes    Alcohol/week: 14.0 standard drinks of alcohol    Types: 14 Cans of beer per week    Comment: 8 beers a week   Drug use: Yes    Frequency: 7.0 times per week    Types: Marijuana    Comment: to help with colitis- multiple times a day   Sexual activity: Not Currently  Other Topics Concern   Not on file  Social History Narrative   Not on file   Social Determinants of Health   Financial Resource Strain: Low Risk  (07/16/2021)   Overall Financial Resource Strain (CARDIA)    Difficulty of Paying Living Expenses:  Not hard at all  Food Insecurity: No Food Insecurity (07/16/2021)   Hunger Vital Sign    Worried About Running Out of Food in the Last Year: Never true    Ran Out of Food in the Last Year: Never true  Transportation Needs: No Transportation Needs (07/16/2021)   PRAPARE - Hydrologist (Medical): No    Lack of Transportation (Non-Medical): No  Physical Activity: Insufficiently Active (07/16/2021)   Exercise Vital Sign    Days of Exercise  per Week: 3 days    Minutes of Exercise per Session: 30 min  Stress: No Stress Concern Present (07/16/2021)   [place]    Feeling of Stress : Not at all  Social Connections: Socially Isolated (07/16/2021)   Social Connection and Isolation Panel [NHANES]    Frequency of Communication with Friends and Family: Three times a week    Frequency of Social Gatherings with Friends and Family: Three times a week    Attends Religious Services: Never    Active Member of Clubs or Organizations: No    Attends Music therapist: Never    Marital Status: Divorced    Tobacco Counseling Counseling given: Not Answered   Clinical Intake:  Pre-visit preparation completed: Yes  Pain : No/denies pain     Nutritional Risks: None Diabetes: No  How often do you need to have someone help you when you read instructions, pamphlets, or other written materials from your doctor or pharmacy?: 1 - Never What is the last grade level you completed in school?: [place]   Interpreter Needed?: No  Information entered by :: L.Amanada Philbrick,LPN   Activities of Daily Living    07/16/2021    1:01 PM 03/20/2021    7:30 PM  In your present state of health, do you have any difficulty performing the following activities:  Hearing? 0 0  Vision? 0 0  Difficulty concentrating or making decisions? 0 0  Walking or climbing stairs? 0 1  Dressing or bathing? 0 0  Doing  errands, shopping? 0 1  Preparing Food and eating ? N   Using the Toilet? N   In the past six months, have you accidently leaked urine? N   Do you have problems with loss of bowel control? N   Managing your Medications? N   Managing your Finances? N   Housekeeping or managing your Housekeeping? N     Patient Care Team: Nche, Charlene Brooke, NP as PCP - General (Internal Medicine) Germaine Pomfret, [facility] as Pharmacist (Pharmacist)  Indicate any recent Medical Services you may have received from other than [facility] providers in the past year (date may be approximate).     Assessment:   This is a routine wellness examination for Stacie [name].  Hearing/Vision screen Vision Screening - Comments:: Annual eye exams wears glasses   Dietary issues and exercise activities discussed: Current Exercise Habits: Home exercise routine, Type of exercise: walking, Time (Minutes): 30, Frequency (Times/Week): 3, Weekly Exercise (Minutes/Week): 90, Intensity: Mild, Exercise limited by: orthopedic condition(s)   Goals Addressed             This Visit's Progress    Patient Stated   On track    Increase activity       Depression Screen    07/16/2021   12:56 PM 02/06/2020   12:52 PM 06/20/2019   11:55 AM 10/18/2017    2:02 PM 06/18/2017    1:49 PM 06/04/2017    1:26 PM 05/25/2016    8:30 AM  PHQ 2/9 Scores  PHQ - 2 Score 0 0 0 0 4 0 0  PHQ- 9 Score   '3 1 14      '$ Fall Risk    07/16/2021   12:56 PM 02/06/2020   12:51 PM 07/27/2018    1:59 PM 06/16/2018    2:51 PM 10/18/2017    1:34 PM  Fall Risk   Falls in the past year? 1 1  1 1 No  Number falls in past yr: 0 0 0 0   Comment   accident/called EMS    Injury with Fall? 1 0 0 1   Comment   black eyes/denied other symptoms bruises   Risk for fall due to : Impaired mobility      Risk for fall due to: Comment uses cane and walker      Follow up Falls evaluation completed;Education provided Falls prevention discussed       FALL RISK PREVENTION  PERTAINING TO THE HOME:  Any stairs in or around the home? Yes  If so, are there any without handrails? No  Home free of loose throw rugs in walkways, pet beds, electrical cords, etc? Yes  Adequate lighting in your home to reduce risk of falls? Yes   ASSISTIVE DEVICES UTILIZED TO PREVENT FALLS:  Life alert? No  Use of a cane, walker or w/c? Yes  Grab bars in the bathroom? Yes  Shower chair or bench in shower? Yes  Elevated toilet seat or a handicapped toilet? Yes   Cognitive Function:Normal cognitive status assessed by telephone conversation  by this Nurse Health Advisor. No abnormalities found.      03/13/2020   12:19 PM  MMSE - Mini Mental State Exam  Orientation to time 5  Orientation to Place 5  Registration 2  Attention/ Calculation 5  Recall 2  Language- name 2 objects 2  Language- repeat 1  Language- follow 3 step command 3  Language- read & follow direction 1  Write a sentence 1  Copy design 1  Total score 28        02/06/2020   12:57 PM  6CIT Screen  What Year? 0 points  What month? 0 points  What time? 0 points  Count back from 20 0 points  Months in reverse 4 points  Repeat phrase 0 points  Total Score 4 points    Immunizations  There is no immunization history on file for this patient.  TDAP status: Due, Education has been provided regarding the importance of this vaccine. Advised may receive this vaccine at local pharmacy or Health Dept. Aware to provide a copy of the vaccination record if obtained from local pharmacy or Health Dept. Verbalized acceptance and understanding.  Flu Vaccine status: Due, Education has been provided regarding the importance of this vaccine. Advised may receive this vaccine at local pharmacy or Health Dept. Aware to provide a copy of the vaccination record if obtained from local pharmacy or Health Dept. Verbalized acceptance and understanding.  Pneumococcal vaccine status: Due, Education has been provided regarding the  importance of this vaccine. Advised may receive this vaccine at local pharmacy or Health Dept. Aware to provide a copy of the vaccination record if obtained from local pharmacy or Health Dept. Verbalized acceptance and understanding.  Covid-19 vaccine status: Declined, Education has been provided regarding the importance of this vaccine but patient still declined. Advised may receive this vaccine at local pharmacy or Health Dept.or vaccine clinic. Aware to provide a copy of the vaccination record if obtained from local pharmacy or Health Dept. Verbalized acceptance and understanding.  Qualifies for Shingles Vaccine? Yes   Zostavax completed No   Shingrix Completed?: No.    Education has been provided regarding the importance of this vaccine. Patient has been advised to call insurance company to determine out of pocket expense if they have not yet received this vaccine. Advised may also receive vaccine at local pharmacy or Health Dept.  Verbalized acceptance and understanding.  Screening Tests Health Maintenance  Topic Date Due   COVID-19 Vaccine (1) Never done   TETANUS/TDAP  Never done   MAMMOGRAM  Never done   Zoster Vaccines- Shingrix (1 of 2) Never done   Pneumonia Vaccine 63+ Years old (1 - PCV) Never done   DEXA SCAN  Never done   INFLUENZA VACCINE  09/02/2021   COLONOSCOPY (Pts 45-5yr Insurance coverage will need to be confirmed)  08/31/2028   Hepatitis C Screening  Completed   HPV VACCINES  Aged Out    Health Maintenance  Health Maintenance Due  Topic Date Due   COVID-19 Vaccine (1) Never done   TETANUS/TDAP  Never done   MAMMOGRAM  Never done   Zoster Vaccines- Shingrix (1 of 2) Never done   Pneumonia Vaccine 66 Years old (1 - PCV) Never done   DEXA SCAN  Never done    Colorectal cancer screening: Type of screening: Colonoscopy. Completed 09/01/2018. Repeat every 10 years  Mammogram status: Ordered 07/16/2021. Pt provided with contact info and advised to call to  schedule appt.   Bone Density status: Ordered 04/16/2021. Pt provided with contact info and advised to call to schedule appt.  Lung Cancer Screening: (Low Dose CT Chest recommended if Age 66-80years, 30 pack-year currently smoking OR have quit w/in 15years.) does not qualify.   Lung Cancer Screening Referral: n/a  Additional Screening:  Hepatitis C Screening: does not qualify; Completed 05/21/2016  Vision Screening: Recommended annual ophthalmology exams for early detection of glaucoma and other disorders of the eye. Is the patient up to date with their annual eye exam?  Yes  Who is the provider or what is the name of the office in which the patient attends annual eye exams? Unknown  If pt is not established with a provider, would they like to be referred to a provider to establish care? No .   Dental Screening: Recommended annual dental exams for proper oral hygiene  Community Resource Referral / Chronic Care Management: CRR required this visit?  No   CCM required this visit?  No      Plan:     I have personally reviewed and noted the following in the patient's chart:   Medical and social history Use of alcohol, tobacco or illicit drugs  Current medications and supplements including opioid prescriptions.  Functional ability and status Nutritional status Physical activity Advanced directives List of other physicians Hospitalizations, surgeries, and ER visits in previous 12 months Vitals Screenings to include cognitive, depression, and falls Referrals and appointments  In addition, I have reviewed and discussed with patient certain preventive protocols, quality metrics, and best practice recommendations. A written personalized care plan for preventive services as well as general preventive health recommendations were provided to patient.     LRandel Pigg LPN   [id number]  Nurse Notes: none

## 2021-07-16 NOTE — Patient Instructions (Signed)
Stacie [name] , Thank you for taking time to come for your Medicare Wellness Visit. I appreciate your ongoing commitment to your health goals. Please review the following plan we discussed and let me know if I can assist you in the future.   Screening recommendations/referrals: Colonoscopy: 09/01/2018 Mammogram: referral 07/16/2021 Bone Density: referral 04/19/2021 Recommended yearly ophthalmology/optometry visit for glaucoma screening and checkup Recommended yearly dental visit for hygiene and checkup  Vaccinations: Influenza vaccine: declined  Pneumococcal vaccine: unknown  Tdap vaccine: due  Shingles vaccine: will consider     Advanced directives: yes   Conditions/risks identified: none   Next appointment: none    Preventive Care 66 Years and Older, Female Preventive care refers to lifestyle choices and visits with your health care provider that can promote health and wellness. What does preventive care include? A yearly physical exam. This is also called an annual well check. Dental exams once or twice a year. Routine eye exams. Ask your health care provider how often you should have your eyes checked. Personal lifestyle choices, including: Daily care of your teeth and gums. Regular physical activity. Eating a healthy diet. Avoiding tobacco and drug use. Limiting alcohol use. Practicing safe sex. Taking low-dose aspirin every day. Taking vitamin and mineral supplements as recommended by your health care provider. What happens during an annual well check? The services and screenings done by your health care provider during your annual well check will depend on your age, overall health, lifestyle risk factors, and family history of disease. Counseling  Your health care provider may ask you questions about your: Alcohol use. Tobacco use. Drug use. Emotional well-being. Home and relationship well-being. Sexual activity. Eating habits. History of falls. Memory and  ability to understand (cognition). Work and work Statistician. Reproductive health. Screening  You may have the following tests or measurements: Height, weight, and BMI. Blood pressure. Lipid and cholesterol levels. These may be checked every 5 years, or more frequently if you are over 25 years old. Skin check. Lung cancer screening. You may have this screening every year starting at age 36 if you have a 30-pack-year history of smoking and currently smoke or have quit within the past 15 years. Fecal occult blood test (FOBT) of the stool. You may have this test every year starting at age 66. Flexible sigmoidoscopy or colonoscopy. You may have a sigmoidoscopy every 5 years or a colonoscopy every 10 years starting at age 36. Hepatitis C blood test. Hepatitis B blood test. Sexually transmitted disease (STD) testing. Diabetes screening. This is done by checking your blood sugar (glucose) after you have not eaten for a while (fasting). You may have this done every 1-3 years. Bone density scan. This is done to screen for osteoporosis. You may have this done starting at age 48. Mammogram. This may be done every 1-2 years. Talk to your health care provider about how often you should have regular mammograms. Talk with your health care provider about your test results, treatment options, and if necessary, the need for more tests. Vaccines  Your health care provider may recommend certain vaccines, such as: Influenza vaccine. This is recommended every year. Tetanus, diphtheria, and acellular pertussis (Tdap, Td) vaccine. You may need a Td booster every 10 years. Zoster vaccine. You may need this after age 29. Pneumococcal 13-valent conjugate (PCV13) vaccine. One dose is recommended after age 31. Pneumococcal polysaccharide (PPSV23) vaccine. One dose is recommended after age 48. Talk to your health care provider about which screenings and vaccines you need  and how often you need them. This information is  not intended to replace advice given to you by your health care provider. Make sure you discuss any questions you have with your health care provider. Document Released: 02/15/2015 Document Revised: 10/09/2015 Document Reviewed: 11/20/2014 Elsevier Interactive Patient Education  2017 [place] Prevention in the Home Falls can cause injuries. They can happen to people of all ages. There are many things you can do to make your home safe and to help prevent falls. What can I do on the outside of my home? Regularly fix the edges of walkways and driveways and fix any cracks. Remove anything that might make you trip as you walk through a door, such as a raised step or threshold. Trim any bushes or trees on the path to your home. Use bright outdoor lighting. Clear any walking paths of anything that might make someone trip, such as rocks or tools. Regularly check to see if handrails are loose or broken. Make sure that both sides of any steps have handrails. Any raised decks and porches should have guardrails on the edges. Have any leaves, snow, or ice cleared regularly. Use sand or salt on walking paths during winter. Clean up any spills in your garage right away. This includes oil or grease spills. What can I do in the bathroom? Use night lights. Install grab bars by the toilet and in the tub and shower. Do not use towel bars as grab bars. Use non-skid mats or decals in the tub or shower. If you need to sit down in the shower, use a plastic, non-slip stool. Keep the floor dry. Clean up any water that spills on the floor as soon as it happens. Remove soap buildup in the tub or shower regularly. Attach bath mats securely with double-sided non-slip rug tape. Do not have throw rugs and other things on the floor that can make you trip. What can I do in the bedroom? Use night lights. Make sure that you have a light by your bed that is easy to reach. Do not use any sheets or blankets that  are too big for your bed. They should not hang down onto the floor. Have a firm chair that has side arms. You can use this for support while you get dressed. Do not have throw rugs and other things on the floor that can make you trip. What can I do in the kitchen? Clean up any spills right away. Avoid walking on wet floors. Keep items that you use a lot in easy-to-reach places. If you need to reach something above you, use a strong step stool that has a grab bar. Keep electrical cords out of the way. Do not use floor polish or wax that makes floors slippery. If you must use wax, use non-skid floor wax. Do not have throw rugs and other things on the floor that can make you trip. What can I do with my stairs? Do not leave any items on the stairs. Make sure that there are handrails on both sides of the stairs and use them. Fix handrails that are broken or loose. Make sure that handrails are as long as the stairways. Check any carpeting to make sure that it is firmly attached to the stairs. Fix any carpet that is loose or worn. Avoid having throw rugs at the top or bottom of the stairs. If you do have throw rugs, attach them to the floor with carpet tape. Make sure that you  have a light switch at the top of the stairs and the bottom of the stairs. If you do not have them, ask someone to add them for you. What else can I do to help prevent falls? Wear shoes that: Do not have high heels. Have rubber bottoms. Are comfortable and fit you well. Are closed at the toe. Do not wear sandals. If you use a stepladder: Make sure that it is fully opened. Do not climb a closed stepladder. Make sure that both sides of the stepladder are locked into place. Ask someone to hold it for you, if possible. Clearly mark and make sure that you can see: Any grab bars or handrails. First and last steps. Where the edge of each step is. Use tools that help you move around (mobility aids) if they are needed. These  include: Canes. Walkers. Scooters. Crutches. Turn on the lights when you go into a dark area. Replace any light bulbs as soon as they burn out. Set up your furniture so you have a clear path. Avoid moving your furniture around. If any of your floors are uneven, fix them. If there are any pets around you, be aware of where they are. Review your medicines with your doctor. Some medicines can make you feel dizzy. This can increase your chance of falling. Ask your doctor what other things that you can do to help prevent falls. This information is not intended to replace advice given to you by your health care provider. Make sure you discuss any questions you have with your health care provider. Document Released: 11/15/2008 Document Revised: 06/27/2015 Document Reviewed: 02/23/2014 Elsevier Interactive Patient Education  2017 [employer].

## 2021-08-18 ENCOUNTER — Other Ambulatory Visit: Payer: Self-pay | Admitting: Nurse Practitioner

## 2021-08-18 DIAGNOSIS — F411 Generalized anxiety disorder: Secondary | ICD-10-CM

## 2021-08-18 DIAGNOSIS — I10 Essential (primary) hypertension: Secondary | ICD-10-CM

## 2021-08-18 DIAGNOSIS — F3342 Major depressive disorder, recurrent, in full remission: Secondary | ICD-10-CM

## 2021-08-18 MED ORDER — ESCITALOPRAM OXALATE 20 MG PO TABS: ORAL_TABLET | ORAL | 0 refills | Status: DC

## 2021-08-18 MED ORDER — AMLODIPINE BESYLATE 10 MG PO TABS: 10.0000 mg | ORAL_TABLET | Freq: Every day | ORAL | 0 refills | Status: DC

## 2021-08-21 ENCOUNTER — Other Ambulatory Visit: Payer: Self-pay | Admitting: Nurse Practitioner

## 2021-08-21 DIAGNOSIS — I700 Atherosclerosis of aorta: Secondary | ICD-10-CM

## 2021-08-21 DIAGNOSIS — E782 Mixed hyperlipidemia: Secondary | ICD-10-CM

## 2021-08-21 NOTE — Telephone Encounter (Signed)
Chart supports Rx Last OV: 04/2021 Next OV: not scheduled   

## 2021-09-03 ENCOUNTER — Telehealth: Payer: Self-pay | Admitting: Nurse Practitioner

## 2021-09-03 ENCOUNTER — Encounter: Payer: Self-pay | Admitting: Nurse Practitioner

## 2021-09-03 NOTE — Telephone Encounter (Signed)
Sent pt , [employer]

## 2021-09-03 NOTE — Telephone Encounter (Signed)
Pt daughter called and stated she need a document for her job stated she is taking care of her mom because mom fell and broke both of her hips and she is taking care of her now.  Daughter ( courtney Liane Comber [phone])

## 2021-09-26 NOTE — Telephone Encounter (Signed)
Na

## 2021-09-29 DIAGNOSIS — L281 Prurigo nodularis: Secondary | ICD-10-CM | POA: Diagnosis not present

## 2021-09-29 DIAGNOSIS — L309 Dermatitis, unspecified: Secondary | ICD-10-CM | POA: Diagnosis not present

## 2021-10-09 ENCOUNTER — Encounter: Payer: Self-pay | Admitting: Nurse Practitioner

## 2021-10-09 ENCOUNTER — Ambulatory Visit (INDEPENDENT_AMBULATORY_CARE_PROVIDER_SITE_OTHER): Payer: Medicare Other | Admitting: Nurse Practitioner

## 2021-10-09 VITALS — BP 128/78 | HR 70 | Temp 96.4°F | Ht 64.0 in | Wt 85.6 lb

## 2021-10-09 DIAGNOSIS — Z1231 Encounter for screening mammogram for malignant neoplasm of breast: Secondary | ICD-10-CM | POA: Diagnosis not present

## 2021-10-09 DIAGNOSIS — D539 Nutritional anemia, unspecified: Secondary | ICD-10-CM | POA: Diagnosis not present

## 2021-10-09 DIAGNOSIS — Z780 Asymptomatic menopausal state: Secondary | ICD-10-CM | POA: Diagnosis not present

## 2021-10-09 DIAGNOSIS — R7989 Other specified abnormal findings of blood chemistry: Secondary | ICD-10-CM | POA: Diagnosis not present

## 2021-10-09 DIAGNOSIS — E782 Mixed hyperlipidemia: Secondary | ICD-10-CM

## 2021-10-09 DIAGNOSIS — R739 Hyperglycemia, unspecified: Secondary | ICD-10-CM | POA: Diagnosis not present

## 2021-10-09 DIAGNOSIS — I700 Atherosclerosis of aorta: Secondary | ICD-10-CM | POA: Diagnosis not present

## 2021-10-09 DIAGNOSIS — I10 Essential (primary) hypertension: Secondary | ICD-10-CM | POA: Diagnosis not present

## 2021-10-09 DIAGNOSIS — F3342 Major depressive disorder, recurrent, in full remission: Secondary | ICD-10-CM

## 2021-10-09 DIAGNOSIS — I5032 Chronic diastolic (congestive) heart failure: Secondary | ICD-10-CM

## 2021-10-09 DIAGNOSIS — F411 Generalized anxiety disorder: Secondary | ICD-10-CM

## 2021-10-09 DIAGNOSIS — I739 Peripheral vascular disease, unspecified: Secondary | ICD-10-CM

## 2021-10-09 LAB — COMPREHENSIVE METABOLIC PANEL
ALT: 13 U/L (ref 0–35)
AST: 16 U/L (ref 0–37)
Albumin: 4 g/dL (ref 3.5–5.2)
Alkaline Phosphatase: 86 U/L (ref 39–117)
BUN: 18 mg/dL (ref 6–23)
CO2: 30 mEq/L (ref 19–32)
Calcium: 9.6 mg/dL (ref 8.4–10.5)
Chloride: 103 mEq/L (ref 96–112)
Creatinine, Ser: 0.82 mg/dL (ref 0.40–1.20)
GFR: 74.58 mL/min (ref >60.00)
Glucose, Bld: 84 mg/dL (ref 70–99)
Potassium: 4.4 mEq/L (ref 3.5–5.1)
Sodium: 139 mEq/L (ref 135–145)
Total Bilirubin: 0.2 mg/dL (ref 0.2–1.2)
Total Protein: 7.1 g/dL (ref 6.0–8.3)

## 2021-10-09 LAB — LIPID PANEL
Cholesterol: 178 mg/dL (ref 0–200)
HDL: 40.5 mg/dL (ref >39.00)
NonHDL: 137.69
Total CHOL/HDL Ratio: 4
Triglycerides: 223 mg/dL — ABNORMAL HIGH (ref 0.0–149.0)
VLDL: 44.6 mg/dL — ABNORMAL HIGH (ref 0.0–40.0)

## 2021-10-09 LAB — LDL CHOLESTEROL, DIRECT: Direct LDL: 110 mg/dL

## 2021-10-09 LAB — HEMOGLOBIN A1C: Hgb A1c MFr Bld: 5.8 % (ref 4.6–6.5)

## 2021-10-09 LAB — B12 AND FOLATE PANEL
Folate: >23.9 ng/mL (ref >5.9)
Vitamin B-12: 332 pg/mL (ref 211–911)

## 2021-10-09 MED ORDER — AMLODIPINE BESYLATE 10 MG PO TABS: 10.0000 mg | ORAL_TABLET | Freq: Every day | ORAL | 3 refills | Status: DC

## 2021-10-09 MED ORDER — ISOSORBIDE MONONITRATE ER 30 MG PO TB24: ORAL_TABLET | ORAL | 3 refills | Status: DC

## 2021-10-09 MED ORDER — FOLIC ACID 1 MG PO TABS: 1.0000 mg | ORAL_TABLET | Freq: Every day | ORAL | 3 refills | Status: DC

## 2021-10-09 MED ORDER — SIMVASTATIN 20 MG PO TABS: ORAL_TABLET | ORAL | 3 refills | Status: DC

## 2021-10-09 MED ORDER — ESCITALOPRAM OXALATE 20 MG PO TABS: ORAL_TABLET | ORAL | 0 refills | Status: DC

## 2021-10-09 MED ORDER — HYDRALAZINE HCL 100 MG PO TABS: 100.0000 mg | ORAL_TABLET | Freq: Three times a day (TID) | ORAL | 3 refills | Status: DC

## 2021-10-09 NOTE — Assessment & Plan Note (Signed)
No SOB or CP or LE edema or cough Maintain imdur, amlodipine and hydralazine dose

## 2021-10-09 NOTE — Progress Notes (Signed)
Established Patient Visit  Patient: Stacie [name]   DOB: Oct 29, 1955   66 y.o. Female  MRN: [id number] Visit Date: 10/09/2021  Subjective:    Chief Complaint  Patient presents with   Office Visit    Medication refills  No concerns today    HPI Accompanied by her daughter-Stacie [name]. [name] And her daughter deny any ETOH consumption since last hospitalization. She had 15fll at home 2weeks ago. She fell while trying to get to her walker. She lives with Stacie [name] Essential hypertension BP at goal BP Readings from Last 3 Encounters:  10/09/21 128/78  04/16/21 (!) 115/59  03/28/21 137/77    Chronic diastolic CHF (congestive heart failure) (HCC) No SOB or CP or LE edema or cough Maintain imdur, amlodipine and hydralazine dose  PAD (peripheral artery disease) (HCC) Maintain zocor dose BP at goal Denies any claudication or ulcers Check hgbA1c and lipid panel  Depression Stable mood with lexapro Refill sent  BP Readings from Last 3 Encounters:  10/09/21 128/78  04/16/21 (!) 115/59  03/28/21 137/77    Wt Readings from Last 3 Encounters:  10/09/21 85 lb 9.6 oz (38.8 kg)  04/16/21 83 lb 3.2 oz (37.7 kg)  03/23/21 93 lb 11.1 oz (42.5 kg)    Reviewed medical, surgical, and social history today  Medications: Outpatient Medications Prior to Visit  Medication Sig   acetaminophen (TYLENOL) 325 MG tablet Take 2 tablets (650 mg total) by mouth every 4 (four) hours as needed for mild pain (or temp > 37.5 C (99.5 F)).   Multiple Vitamin (MULTIVITAMIN WITH MINERALS) TABS tablet Take 1 tablet by mouth daily.   polyethylene glycol (MIRALAX / GLYCOLAX) 17 g packet Take 17 g by mouth daily as needed for mild constipation or moderate constipation.   [DISCONTINUED] amLODipine (NORVASC) 10 MG tablet Take 1 tablet (10 mg total) by mouth daily.   [DISCONTINUED] escitalopram (LEXAPRO) 20 MG tablet TAKE 1 TABLET(20 MG) BY MOUTH DAILY. No additional refill without office  visit   [DISCONTINUED] folic acid (FOLVITE) 1 MG tablet Take 1 tablet (1 mg total) by mouth daily.   [DISCONTINUED] hydrALAZINE (APRESOLINE) 100 MG tablet Take 1 tablet (100 mg total) by mouth every 8 (eight) hours. (Patient taking differently: Take 100 mg by mouth daily at 12 noon.)   [DISCONTINUED] isosorbide mononitrate (IMDUR) 30 MG 24 hr tablet TAKE 1 TABLET(30 MG) BY MOUTH DAILY (Patient taking differently: Take 30 mg by mouth daily.)   [DISCONTINUED] aspirin EC 81 MG EC tablet Take 1 tablet (81 mg total) by mouth daily. (Patient not taking: Reported on 10/09/2021)   [DISCONTINUED] enoxaparin (LOVENOX) 40 MG/0.4ML injection Inject 0.4 mLs (40 mg total) into the skin daily for 30 doses. For 30 days post op for DVT prophylaxis   [DISCONTINUED] HYDROcodone-acetaminophen (NORCO) 5-325 MG tablet Take 1 tablet by mouth every 8 (eight) hours as needed for moderate pain. MAXIMUM TOTAL ACETAMINOPHEN DOSE IS 4000 MG PER DAY (Patient not taking: Reported on 07/16/2021)   [DISCONTINUED] simvastatin (ZOCOR) 20 MG tablet TAKE 1 TABLET BY MOUTH EVERY DAY. NO FUTHER REFILLS UNTIL SEEN BY MD. (Patient not taking: Reported on 10/09/2021)   [DISCONTINUED] thiamine 100 MG tablet Take 1 tablet (100 mg total) by mouth daily. (Patient not taking: Reported on 07/16/2021)   No facility-administered medications prior to visit.   Reviewed past medical and social history.   ROS per HPI above  Objective:  BP 128/78 (BP Location: Left Arm, Patient Position: Sitting, Cuff Size: Small)   Pulse 70   Temp (!) 96.4 F (35.8 C) (Temporal)   Ht '5\' 4"'$  (1.626 m)   Wt 85 lb 9.6 oz (38.8 kg)   SpO2 99%   BMI 14.69 kg/m      Physical Exam Cardiovascular:     Rate and Rhythm: Normal rate and regular rhythm.     Pulses: Normal pulses.     Heart sounds: Normal heart sounds.  Pulmonary:     Effort: Pulmonary effort is normal.     Breath sounds: Normal breath sounds.  Neurological:     Mental Status: She is alert and  oriented to person, place, and time.  Psychiatric:        Mood and Affect: Mood normal.        Behavior: Behavior normal.        Thought Content: Thought content normal.     No results found for any visits on 10/09/21.    Assessment & Plan:    Problem List Items Addressed This Visit       Cardiovascular and Mediastinum   Chronic diastolic CHF (congestive heart failure) (HCC) - Primary (Chronic)    No SOB or CP or LE edema or cough Maintain imdur, amlodipine and hydralazine dose      Relevant Medications   isosorbide mononitrate (IMDUR) 30 MG 24 hr tablet   simvastatin (ZOCOR) 20 MG tablet   amLODipine (NORVASC) 10 MG tablet   hydrALAZINE (APRESOLINE) 100 MG tablet   Aortic atherosclerosis (HCC)   Relevant Medications   isosorbide mononitrate (IMDUR) 30 MG 24 hr tablet   simvastatin (ZOCOR) 20 MG tablet   amLODipine (NORVASC) 10 MG tablet   hydrALAZINE (APRESOLINE) 100 MG tablet   Other Relevant Orders   Lipid panel   Comprehensive metabolic panel   Essential hypertension    BP at goal BP Readings from Last 3 Encounters:  10/09/21 128/78  04/16/21 (!) 115/59  03/28/21 137/77        Relevant Medications   isosorbide mononitrate (IMDUR) 30 MG 24 hr tablet   simvastatin (ZOCOR) 20 MG tablet   amLODipine (NORVASC) 10 MG tablet   hydrALAZINE (APRESOLINE) 100 MG tablet   Other Relevant Orders   Comprehensive metabolic panel   PAD (peripheral artery disease) (HCC)    Maintain zocor dose BP at goal Denies any claudication or ulcers Check hgbA1c and lipid panel      Relevant Medications   isosorbide mononitrate (IMDUR) 30 MG 24 hr tablet   simvastatin (ZOCOR) 20 MG tablet   amLODipine (NORVASC) 10 MG tablet   hydrALAZINE (APRESOLINE) 100 MG tablet     Other   Depression (Chronic)    Stable mood with lexapro Refill sent      Relevant Medications   escitalopram (LEXAPRO) 20 MG tablet   Elevated TSH   Relevant Orders   Thyroid Panel With TSH    Hyperlipidemia   Relevant Medications   isosorbide mononitrate (IMDUR) 30 MG 24 hr tablet   simvastatin (ZOCOR) 20 MG tablet   amLODipine (NORVASC) 10 MG tablet   hydrALAZINE (APRESOLINE) 100 MG tablet   Other Visit Diagnoses     Breast cancer screening by mammogram       Relevant Orders   MM 3D SCREEN BREAST BILATERAL   Asymptomatic age-related postmenopausal state       Relevant Orders   DG Bone Density   Hyperglycemia  Relevant Orders   Hemoglobin A1c   Macrocytic anemia       Relevant Medications   folic acid (FOLVITE) 1 MG tablet   Other Relevant Orders   B12 and Folate Panel   GAD (generalized anxiety disorder)       Relevant Medications   escitalopram (LEXAPRO) 20 MG tablet      Return in about 6 months (around 04/09/2022) for HTN, hyperlipidemia (fasting).     Wilfred Lacy, NP

## 2021-10-09 NOTE — Assessment & Plan Note (Addendum)
Maintain zocor dose BP at goal Denies any claudication or ulcers Check hgbA1c and lipid panel

## 2021-10-09 NOTE — Assessment & Plan Note (Signed)
Stable mood with lexapro Refill sent

## 2021-10-09 NOTE — Patient Instructions (Signed)
Maintain current meds Go to lab Schedule appt for mammogram and bone density.  Preventing Unhealthy Weight Gain, Adult Staying at a healthy weight is important to your overall health. When fat builds up in your body, you may become overweight or obese. Being overweight or obese increases your risk of developing various health problems. Unhealthy weight gain is often the result of making unhealthy food choices or not getting enough exercise. You can make changes to your lifestyle to prevent obesity and stay as healthy as possible. How can unhealthy weight gain affect me? Being overweight or obese can cause you to develop joint or bone problems, which can make it hard for you to stay active or do activities you enjoy. Being overweight also puts stress on your heart and lungs and can lead to health problems such as: Diabetes. Heart disease. Some types of cancer. Stroke. Eating healthy, staying active, and having healthy habits can help to prevent unhealthy weight gain and lower your risk for health problems. These lifestyle changes will also help you manage stress and emotions, improve your self-esteem, and connect with friends and family. What can increase my risk? In addition to certain eating and lifestyle choices, some other factors that may make you more likely to have unhealthy weight gain include: Having a family history of obesity. Living in an area with limited access to: [place], recreation centers, or sidewalks. Healthy food choices, such as grocery stores and farmers' markets. What actions can I take to prevent unhealthy weight gain? Nutrition  Eat only as much as your body needs. To do this: Pay attention to signs that you are hungry or full. Stop eating as soon as you feel full. If you feel hungry, try drinking water first before eating. Drink enough water so your urine is pale yellow. Eat smaller portions. Pay attention to portion sizes when eating out. Look at serving sizes on  food labels. Most foods contain more than one serving per container. Eat the recommended number of calories for your gender and activity level. For most active people, a daily total of 2,000 calories is appropriate. If you are trying to lose weight or are not very active, you may need to eat fewer calories. Talk with your health care provider or a dietitian about how many calories you need each day. Choose healthy foods, such as: Fruits and vegetables. At each meal, try to fill at least half of your plate with fruits and vegetables. Whole grains, such as whole-wheat bread, brown rice, and quinoa. Lean meats, such as chicken or fish. Other healthy proteins, such as beans, eggs, or tofu. Healthy fats, such as nuts, seeds, fatty fish, and olive oil. Low-fat or fat-free dairy products. Check food labels, and avoid food and drinks that: Are high in calories. Have added sugar. Are high in sodium. Have saturated fats or trans fats. Cook foods in healthier ways, such as by baking, broiling, or grilling. Make a meal plan for the week, and shop with a grocery list to help you stay on track with your purchases. Try to avoid going to the grocery store when you are hungry. When grocery shopping, try to shop around the outside of the store first, where the fresh foods are. Doing this helps you avoid prepackaged foods, which can be high in sugar, salt (sodium), and fat. Lifestyle  Exercise for 30 or more minutes on 5 or more days each week. Exercising may include brisk walking, yard work, biking, running, swimming, and team sports like basketball and  soccer. Ask your health care provider which exercises are safe for you. Do activities that strengthen the muscles, such as lifting weights or using resistance bands, on 2 or more days a week. Do not use any products that contain nicotine or tobacco. These products include cigarettes, chewing tobacco, and vaping devices, such as e-cigarettes. If you need help  quitting, ask your health care provider. If you drink alcohol: Limit how much you have to: 0-1 drink a day for women who are not pregnant. 0-2 drinks a day for men. Know how much alcohol is in a drink. In the [place] one drink equals one 12 oz bottle of beer (355 mL), one 5 oz glass of wine (148 mL), or one 1 oz glass of hard liquor (44 mL). Try to get 7-9 hours of sleep each night. Other changes Keep a food and activity journal to keep track of: What you ate and how many calories you had. Remember to count the calories in sauces, dressings, and side dishes. Whether you were active, and what exercises you did. Your calorie, weight, and activity goals. Check your weight regularly. Track any changes. If you notice that you have gained weight, make changes to your diet or activity routine. Avoid taking weight-loss medicines or supplements. Talk to your health care provider before starting any new medicine or supplement. Talk to your health care provider before trying any new diet or exercise plan. Where to find more information Talk with your health care provider or a dietitian about healthy eating and healthy lifestyle choices. You may also find information from: [place]. Department of Agriculture, MyPlate: FormerBoss.no American Heart Association: www.heart.org Centers for Disease Control and Prevention: http://www.wolf.info/ Summary Eating healthy, staying active, and having healthy habits can help to prevent unhealthy weight gain and lower your risk for health problems such as heart disease, diabetes, some types of cancer, and stroke. Being overweight or obese can cause you to develop joint or bone problems, which can make it hard for you to stay active or do activities you enjoy. You can prevent unhealthy weight gain by eating a healthy diet, exercising regularly, not smoking, limiting alcohol, and getting enough sleep. Talk with your health care provider or a dietitian for guidance about healthy  eating and healthy lifestyle choices. This information is not intended to replace advice given to you by your health care provider. Make sure you discuss any questions you have with your health care provider. Document Revised: 08/16/2020 Document Reviewed: 08/16/2020 Elsevier Patient Education  [place].

## 2021-10-09 NOTE — Assessment & Plan Note (Signed)
BP at goal BP Readings from Last 3 Encounters:  10/09/21 128/78  04/16/21 (!) 115/59  03/28/21 137/77

## 2021-10-10 LAB — THYROID PANEL WITH TSH
Free Thyroxine Index: 2.1 (ref 1.4–3.8)
T3 Uptake: 30 % (ref 22–35)
T4, Total: 6.9 ug/dL (ref 5.1–11.9)
TSH: 2.65 mIU/L (ref 0.40–4.50)

## 2021-11-22 IMAGING — MR MR HEAD W/O CM
7 series · 48 of 48 positions shown · non-contrast
Comparison: Head CT same day

CLINICAL DATA: Left facial droop and dysarthria noted over the last
3 days.

EXAM:
MRI HEAD WITHOUT CONTRAST
TECHNIQUE: Multiplanar, multiecho pulse sequences of the brain and surrounding
structures were obtained without intravenous contrast.

[Series 3: DWI · axial · 3.0mm · 1.09mm/px · z∈[-30,+110]mm · 13 of 96 slices shown (1 of 6)]
[im 1/96]
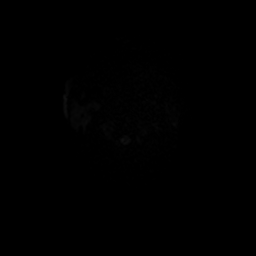
[im 8/96]
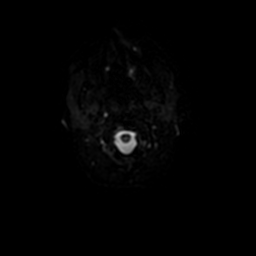
[im 16/96]
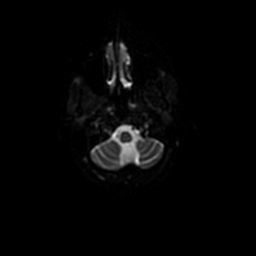
[im 24/96]
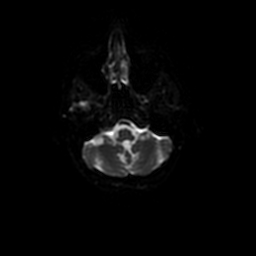
[im 32/96]
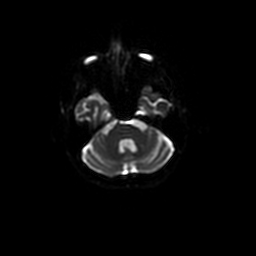
[im 40/96]
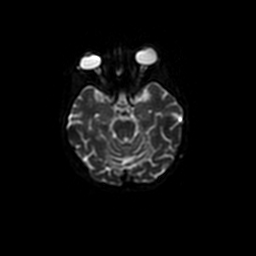
[im 48/96]
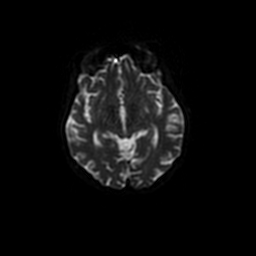
[im 56/96]
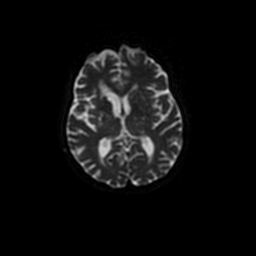
[im 64/96]
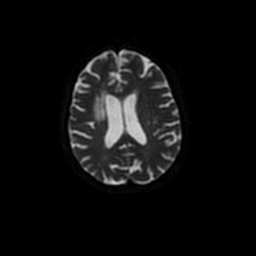
[im 72/96]
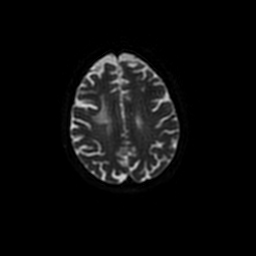
[im 80/96]
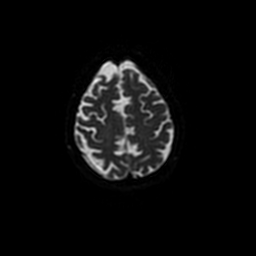
[im 88/96]
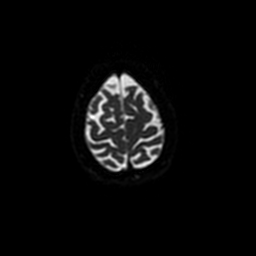
[im 96/96]
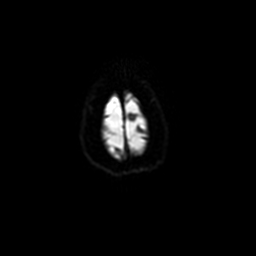

[Series 4: DWI · coronal · 5.0mm · 1.09mm/px · 9 of 64 slices shown (2 of 6)]
[im 1/64]
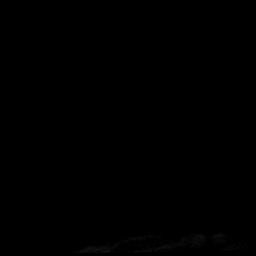
[im 8/64]
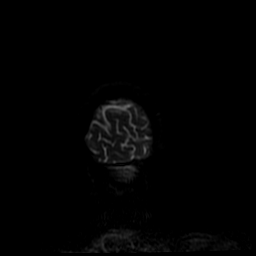
[im 16/64]
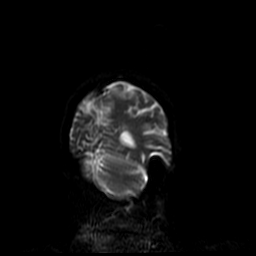
[im 24/64]
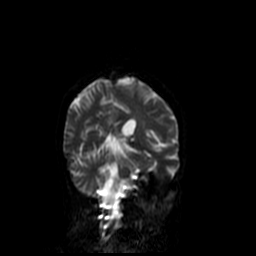
[im 32/64]
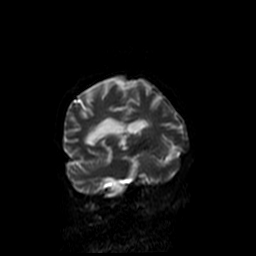
[im 40/64]
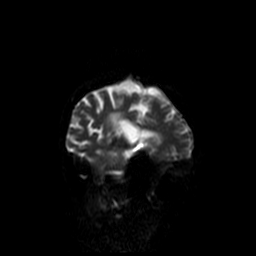
[im 48/64]
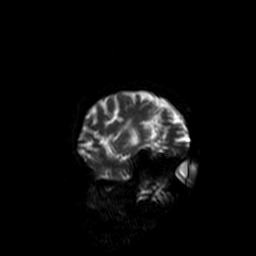
[im 56/64]
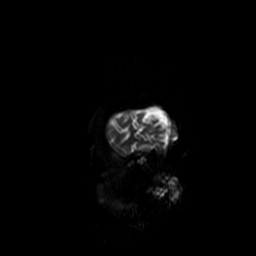
[im 64/64]
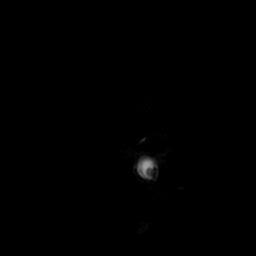

[Series 5: DWI · coronal · 5.0mm · 1.09mm/px · 9 of 64 slices shown (3 of 6)]
[im 1/64]
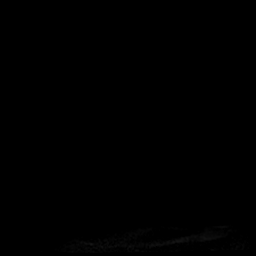
[im 8/64]
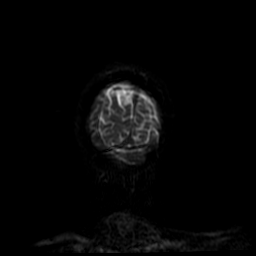
[im 16/64]
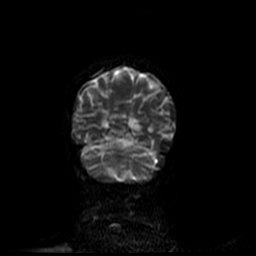
[im 24/64]
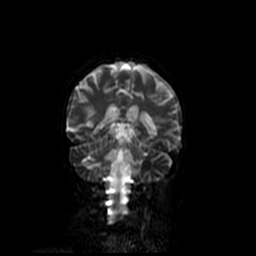
[im 32/64]
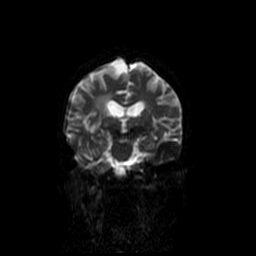
[im 40/64]
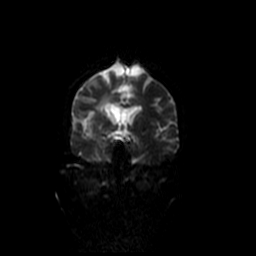
[im 48/64]
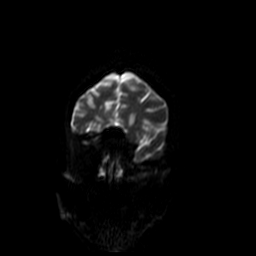
[im 56/64]
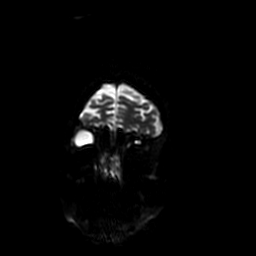
[im 64/64]
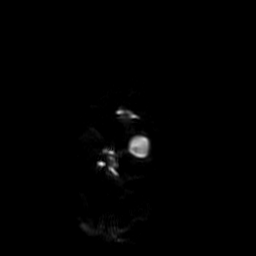

[Series 6: T1 · sagittal · 5.0mm · 0.47mm/px · 3 of 23 slices shown]
[im 1/23]
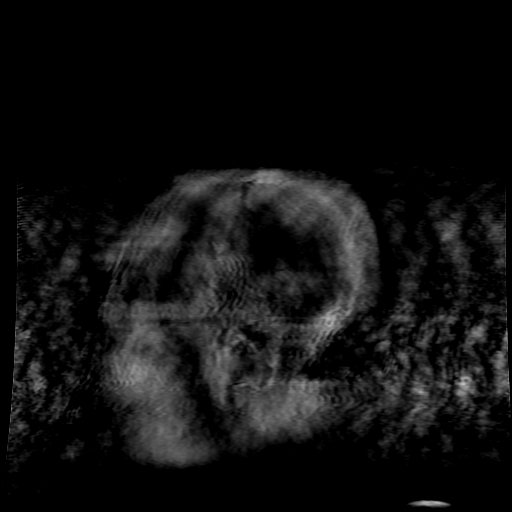
[im 12/23]
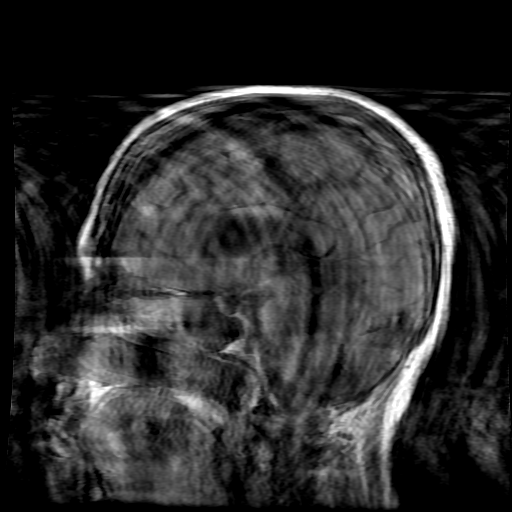
[im 23/23]
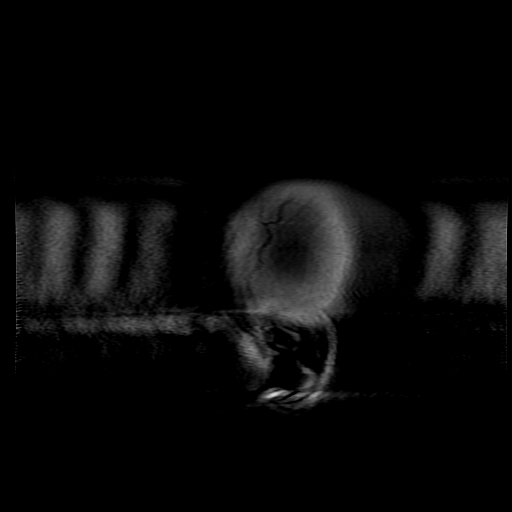

[Series 300: DWI · axial · 3.0mm · 1.09mm/px · z∈[-30,+110]mm · 6 of 48 slices shown (4 of 6)]
[im 1/48]
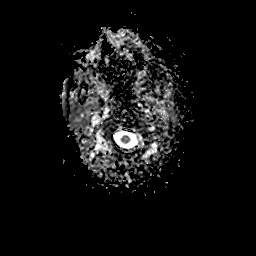
[im 10/48]
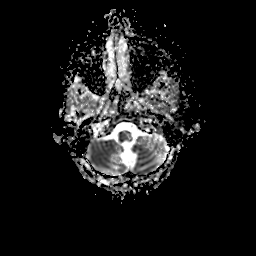
[im 19/48]
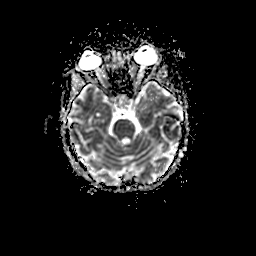
[im 29/48]
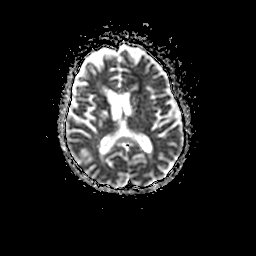
[im 38/48]
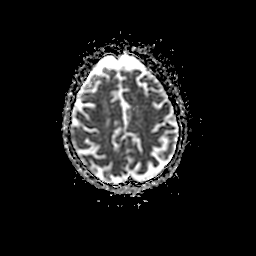
[im 48/48]
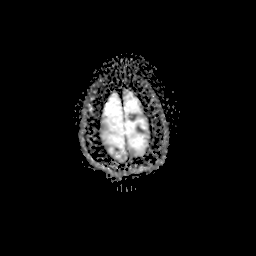

[Series 400: DWI · coronal · 5.0mm · 1.09mm/px · 4 of 32 slices shown (5 of 6)]
[im 1/32]
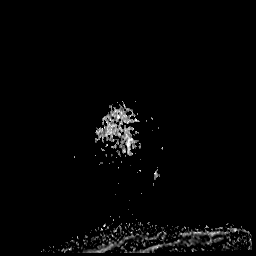
[im 11/32]
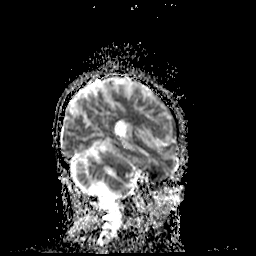
[im 21/32]
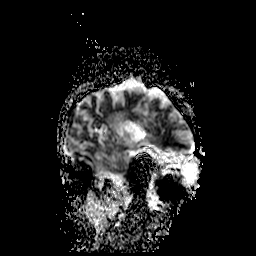
[im 32/32]
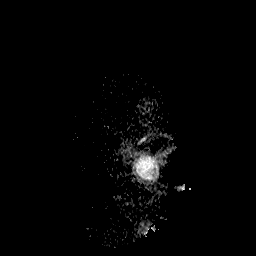

[Series 500: DWI · coronal · 5.0mm · 1.09mm/px · 4 of 32 slices shown (6 of 6)]
[im 1/32]
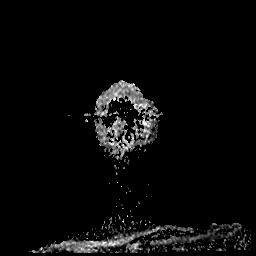
[im 11/32]
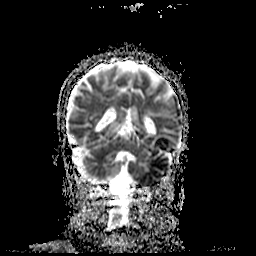
[im 21/32]
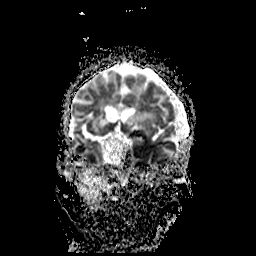
[im 32/32]
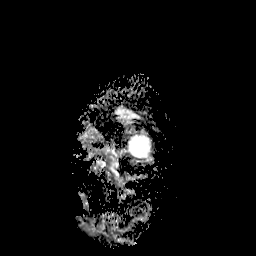

[48 of 48 positions shown; findings below may reference images not displayed]

FINDINGS: Brain: Motion degraded study. No acute finding affects the brainstem
or cerebellum. 8 mm acute infarction in the posterior limb internal
capsule on the right. Punctate acute infarction in the left caudate
head or white matter adjacent to the frontal horn of the lateral
ventricle. 8 mm acute to subacute infarction in the white matter
adjacent to the atrium of the right lateral ventricle. Other white
matter disease seen at CT is not acute or subacute. Old right basal
ganglia infarction. No hydrocephalus. No sign of gross hemorrhage.
No extra-axial collection.
IMPRESSION: Diffusion only study is degraded by motion. 8 mm acute infarction in
the posterior limb internal capsule on the right. Punctate acute
infarction either in the left caudate head or white matter adjacent
to the frontal horn of the left lateral ventricle. 8 mm acute to
subacute infarction in the white matter adjacent to the atrium of
the right lateral ventricle. Old small vessel infarctions elsewhere
affecting the hemispheric white matter and right basal ganglia.

## 2021-11-22 IMAGING — CT CT HEAD W/O CM
3 of 4 series · 14 of 47 positions shown, 16 images · non-contrast
Comparison: No pertinent prior studies available for comparison.

CLINICAL DATA: Neuro deficit, acute, stroke suspected. Additional
history provided: Patient transported via EMS from home for slurred
speech and left-sided facial droop, symptoms developed [REDACTED] night
(unknown time). History of stroke.

EXAM:
CT HEAD WITHOUT CONTRAST
TECHNIQUE: Contiguous axial images were obtained from the base of the skull
through the vertex without intravenous contrast.

[Series 3: head 5.0 h30s · axial · 0.39mm/px · z∈[-120,+0]mm · 8 of 30 slices shown, 10 images]
[im 3/30  brain]
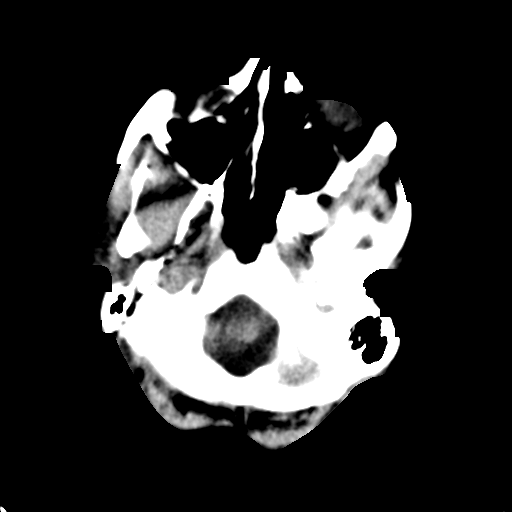
[im 3/30  bone]
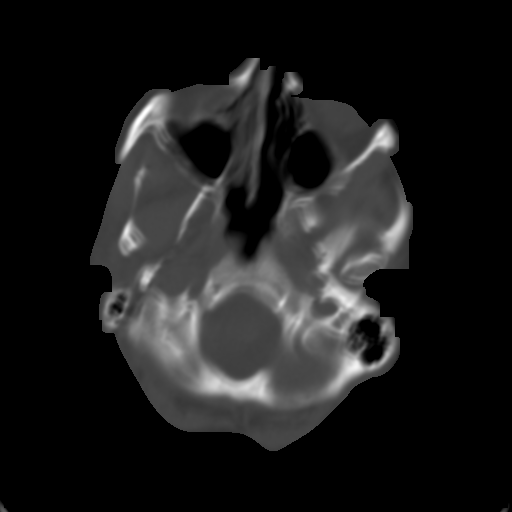
[im 7/30  brain]
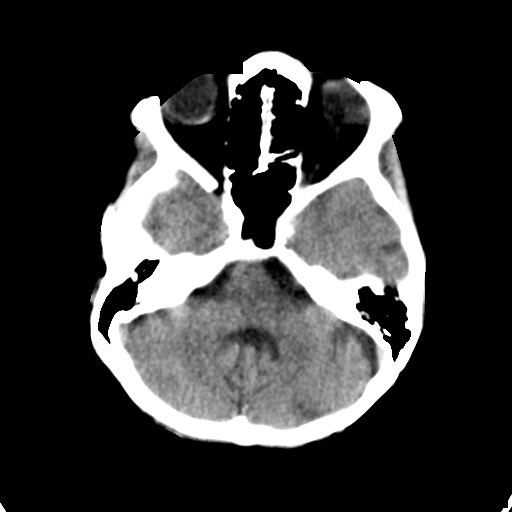
[im 11/30  brain]
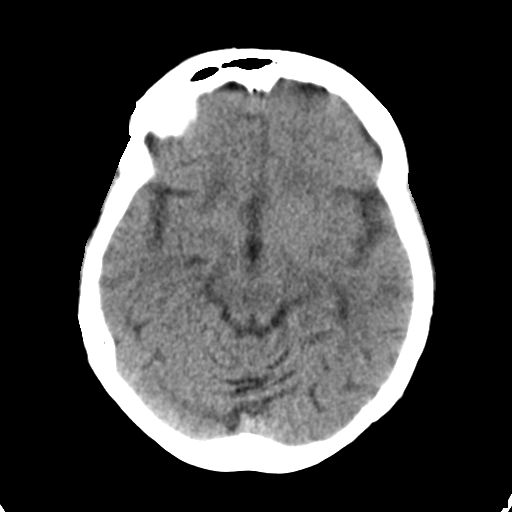
[im 13/30  brain]
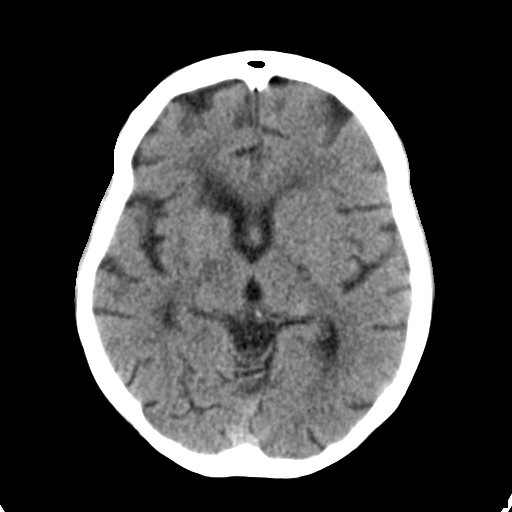
[im 17/30  brain]
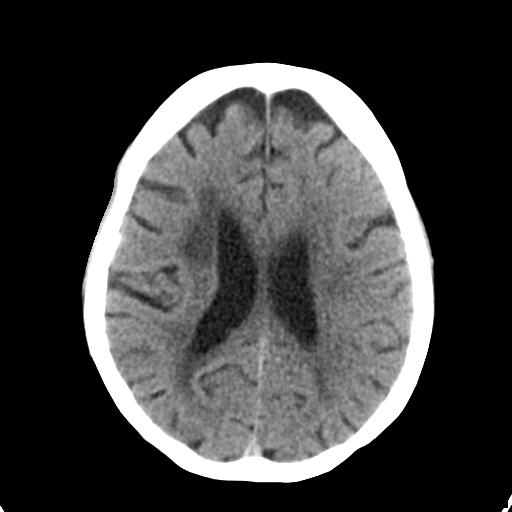
[im 17/30  bone]
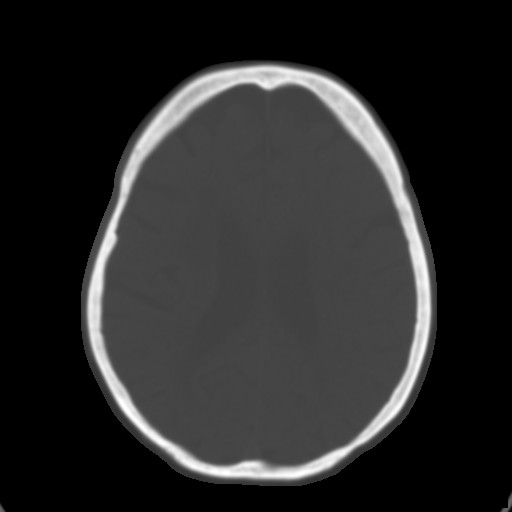
[im 19/30  brain]
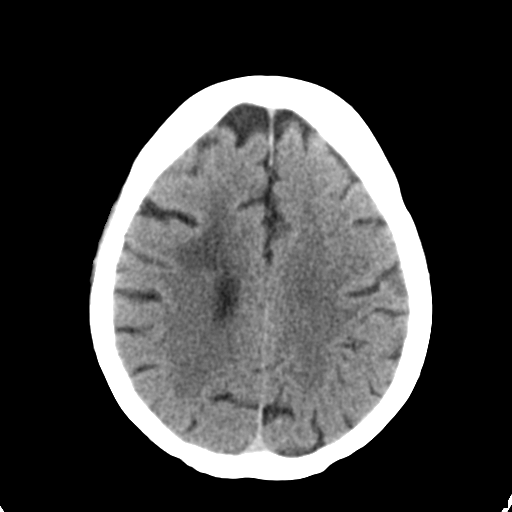
[im 23/30  brain]
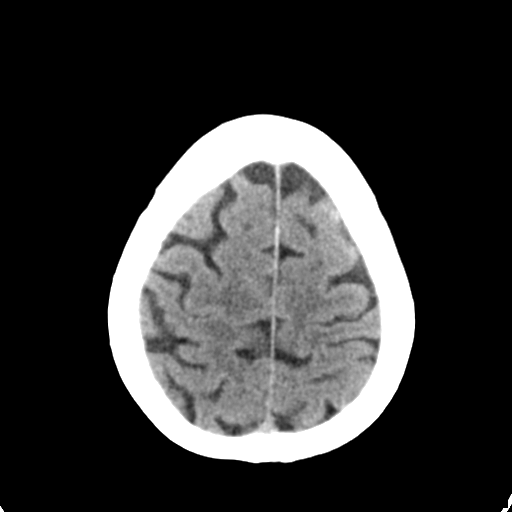
[im 27/30  brain]
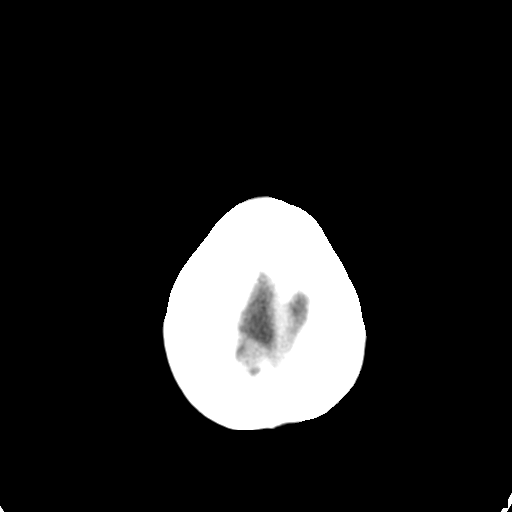

[Series 5: head 3.0 mpr cor · coronal · 0.31mm/px · 3 of 57 slices shown]
[im 19/57  brain]
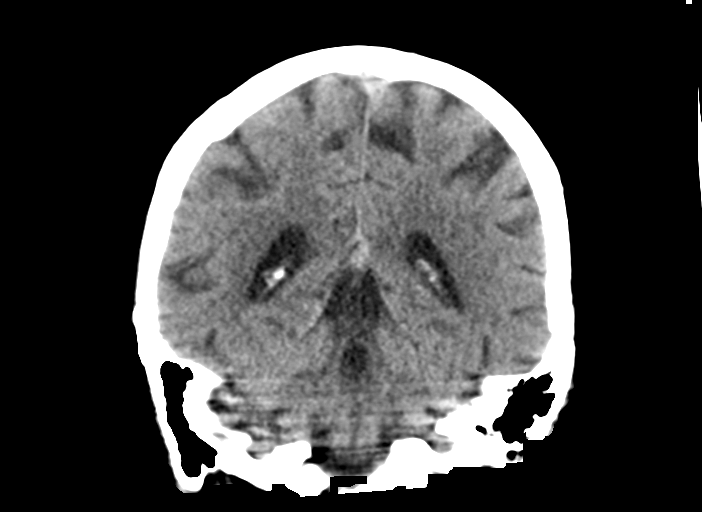
[im 25/57  brain]
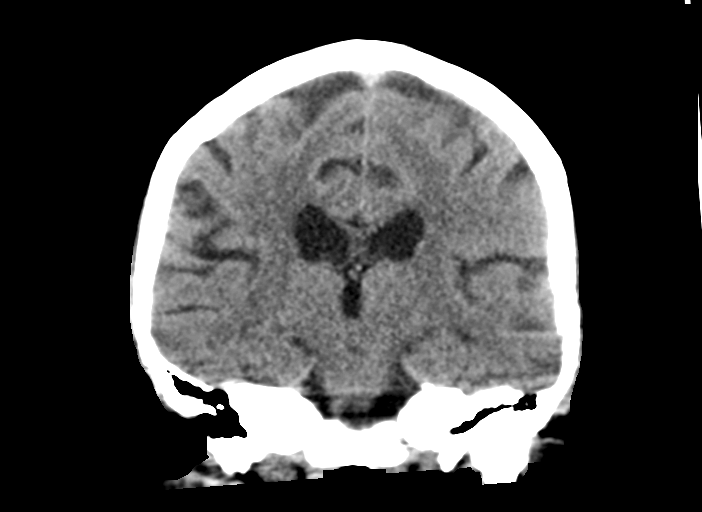
[im 32/57  brain]
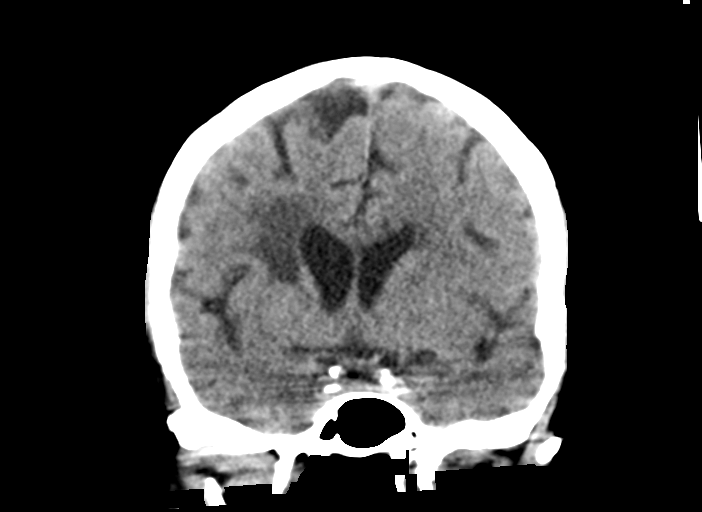

[Series 6: head 3.0 mpr sag · sagittal · 0.30mm/px · 3 of 50 slices shown]
[im 17/50  brain]
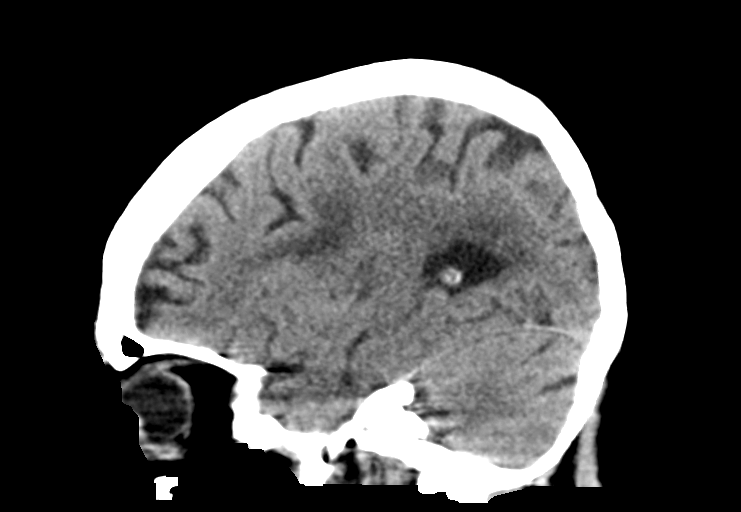
[im 25/50  brain]
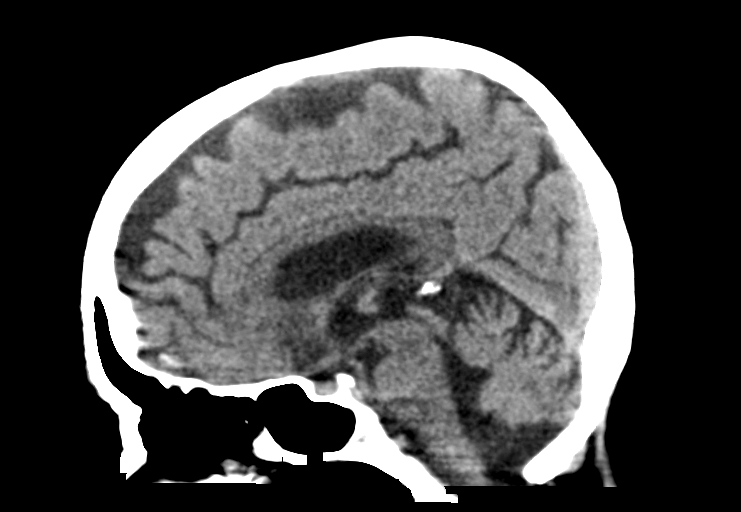
[im 33/50  brain]
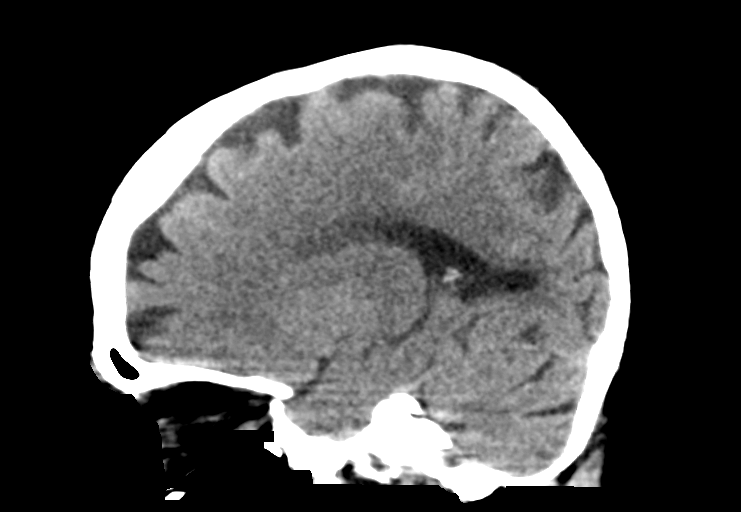

[14 of 47 positions shown; findings below may reference images not displayed]

FINDINGS: Brain:

Mildly motion degraded examination.

Age-indeterminate infarct within the right corona radiata/basal
ganglia. There is an additional age-indeterminate infarct more
anteriorly within the right basal ganglia also involve the adjacent
deep right frontal white matter. There is no evidence of acute
intracranial hemorrhage. No midline shift or extra-axial fluid
collection. Background mild ill-defined hypoattenuation within the
cerebral white matter is nonspecific, but consistent with chronic
small vessel ischemic disease. Mild generalized parenchymal atrophy.

Vascular: No hyperdense vessel is identified. Atherosclerotic
calcifications.

Skull: Normal. Negative for fracture or focal lesion.

Sinuses/Orbits: Visualized orbits demonstrate no acute abnormality.
No significant paranasal sinus disease or mastoid effusion at the
imaged levels.
IMPRESSION: Mildly motion degraded examination.

Age-indeterminate infarcts within the right frontal lobe white
matter and right basal ganglia as described, possibly acute or
subacute. Brain MRI is suggested for further evaluation.

Background mild generalized parenchymal atrophy and chronic small
vessel ischemic disease.

## 2021-12-30 ENCOUNTER — Ambulatory Visit: Payer: Medicare Other | Admitting: Family Medicine

## 2022-01-02 ENCOUNTER — Ambulatory Visit: Payer: Medicare Other | Admitting: Family Medicine

## 2022-02-11 ENCOUNTER — Other Ambulatory Visit: Payer: Self-pay | Admitting: Nurse Practitioner

## 2022-02-11 DIAGNOSIS — F411 Generalized anxiety disorder: Secondary | ICD-10-CM

## 2022-02-11 DIAGNOSIS — F3342 Major depressive disorder, recurrent, in full remission: Secondary | ICD-10-CM

## 2022-02-12 NOTE — Telephone Encounter (Signed)
Chart supports Rx Last OV: 10/2021 Next OV: not scheduled, 30 day supply sent w/ 0 refills. Needs f/u appt for further refills.

## 2022-03-13 ENCOUNTER — Other Ambulatory Visit: Payer: Self-pay | Admitting: Nurse Practitioner

## 2022-03-13 DIAGNOSIS — F411 Generalized anxiety disorder: Secondary | ICD-10-CM

## 2022-03-13 DIAGNOSIS — F3342 Major depressive disorder, recurrent, in full remission: Secondary | ICD-10-CM

## 2022-06-08 ENCOUNTER — Ambulatory Visit (INDEPENDENT_AMBULATORY_CARE_PROVIDER_SITE_OTHER): Payer: Medicare Other | Admitting: Nurse Practitioner

## 2022-06-08 ENCOUNTER — Encounter: Payer: Self-pay | Admitting: Nurse Practitioner

## 2022-06-08 VITALS — BP 110/82 | HR 71 | Temp 98.7°F | Resp 16 | Ht 64.0 in | Wt 98.4 lb

## 2022-06-08 DIAGNOSIS — C44622 Squamous cell carcinoma of skin of right upper limb, including shoulder: Secondary | ICD-10-CM | POA: Diagnosis not present

## 2022-06-08 DIAGNOSIS — C4492 Squamous cell carcinoma of skin, unspecified: Secondary | ICD-10-CM | POA: Diagnosis not present

## 2022-06-08 DIAGNOSIS — F3342 Major depressive disorder, recurrent, in full remission: Secondary | ICD-10-CM

## 2022-06-08 DIAGNOSIS — L819 Disorder of pigmentation, unspecified: Secondary | ICD-10-CM

## 2022-06-08 DIAGNOSIS — F411 Generalized anxiety disorder: Secondary | ICD-10-CM

## 2022-06-08 DIAGNOSIS — W57XXXA Bitten or stung by nonvenomous insect and other nonvenomous arthropods, initial encounter: Secondary | ICD-10-CM

## 2022-06-08 MED ORDER — PERMETHRIN 5 % EX CREA: TOPICAL_CREAM | CUTANEOUS | 1 refills | Status: DC

## 2022-06-08 MED ORDER — ESCITALOPRAM OXALATE 20 MG PO TABS: ORAL_TABLET | ORAL | 3 refills | Status: DC

## 2022-06-08 NOTE — Patient Instructions (Signed)
Check home for bedbugs  Skin Biopsy, Care After The following information offers guidance on how to care for yourself after your procedure. Your health care provider may also give you more specific instructions. If you have problems or questions, contact your health care provider. What can I expect after the procedure? After the procedure, it is common to have: Soreness or mild pain. Bruising. Itching. Some redness and swelling. Follow these instructions at home: Biopsy site care  Follow instructions from your health care provider about how to take care of your biopsy site. Make sure you: Wash your hands with soap and water for at least 20 seconds before and after you change your bandage (dressing). If soap and water are not available, use hand sanitizer. Change your dressing as told by your health care provider. Leave stitches (sutures), skin glue, or adhesive strips in place. These skin closures may need to stay in place for 2 weeks or longer. If adhesive strip edges start to loosen and curl up, you may trim the loose edges. Do not remove adhesive strips completely unless your health care provider tells you to do that. Check your biopsy site every day for signs of infection. Check for: More redness, swelling, or pain. Fluid or blood. Warmth. Pus or a bad smell. Do not take baths, swim, or use a hot tub until your health care provider approves. Ask your health care provider if you may take showers. You may only be allowed to take sponge baths. General instructions Take over-the-counter and prescription medicines only as told by your health care provider. Return to your normal activities as told by your health care provider. Ask your health care provider what activities are safe for you. Keep all follow-up visits. This is important. Contact a health care provider if: You have more redness, swelling, or pain around your biopsy site. You have fluid or blood coming from your biopsy  site. Your biopsy site feels warm to the touch. You have pus or a bad smell coming from your biopsy site. You have a fever. Your sutures, skin glue, or adhesive strips loosen or come off sooner than expected. Get help right away if: You have bleeding that does not stop with pressure or a dressing. Summary After the procedure, it is common to have soreness, bruising, and itching at the site. Follow instructions from your health care provider about how to take care of your biopsy site. Check your biopsy site every day for signs of infection. Contact a health care provider if you have more redness, swelling, or pain around your biopsy site, or your biopsy site feels warm to the touch. Keep all follow-up visits. This is important. This information is not intended to replace advice given to you by your health care provider. Make sure you discuss any questions you have with your health care provider. Document Revised: 08/20/2020 Document Reviewed: 08/20/2020 Elsevier Patient Education  2023 [employer].

## 2022-06-08 NOTE — Assessment & Plan Note (Signed)
Stable mood with lexapro °Refill sent °

## 2022-06-08 NOTE — Progress Notes (Addendum)
Acute Office Visit  Subjective:    Patient ID: Stacie [name], female    DOB: 1955-08-02, 67 y.o.   MRN: [id number]  Chief Complaint  Patient presents with   lesion on shoulder    Sores on lower extremities    Patient has sores on upper and lower extremities. They are itchy especially at night   Rash This is a recurrent problem. The current episode started 1 to 4 weeks ago. The problem is unchanged. The affected locations include the torso, right arm, left arm, left lower leg, left upper leg, right lower leg and right upper leg. The rash is characterized by itchiness and redness. She was exposed to an insect bite/sting. Pertinent negatives include no fatigue or fever. Past treatments include nothing.  Itching is worse at night. Hx of bedbug infestation 3months ago. Rash had resolved after chemical treatment.  She had another lesion on right shoulder: first noted last week. She states lesion bleeds intermittently  Outpatient Medications Prior to Visit  Medication Sig   acetaminophen (TYLENOL) 325 MG tablet Take 2 tablets (650 mg total) by mouth every 4 (four) hours as needed for mild pain (or temp > 37.5 C (99.5 F)).   amLODipine (NORVASC) 10 MG tablet Take 1 tablet (10 mg total) by mouth daily.   folic acid (FOLVITE) 1 MG tablet Take 1 tablet (1 mg total) by mouth daily.   hydrALAZINE (APRESOLINE) 100 MG tablet Take 1 tablet (100 mg total) by mouth 3 (three) times daily.   isosorbide mononitrate (IMDUR) 30 MG 24 hr tablet TAKE 1 TABLET(30 MG) BY MOUTH DAILY   Multiple Vitamin (MULTIVITAMIN WITH MINERALS) TABS tablet Take 1 tablet by mouth daily.   polyethylene glycol (MIRALAX / GLYCOLAX) 17 g packet Take 17 g by mouth daily as needed for mild constipation or moderate constipation.   simvastatin (ZOCOR) 20 MG tablet TAKE 1 TABLET BY MOUTH EVERY DAY. NO FUTHER REFILLS UNTIL SEEN BY MD.   [DISCONTINUED] escitalopram (LEXAPRO) 20 MG tablet TAKE 1 TABLET BY MOUTH DAILY. Need  office visit for additional refill   No facility-administered medications prior to visit.   Reviewed past medical and social history.  Review of Systems  Constitutional:  Negative for fatigue and fever.  Endocrine: Negative for cold intolerance and heat intolerance.  Musculoskeletal:  Negative for joint swelling and myalgias.  Skin:  Positive for rash.      Objective:    Physical Exam Vitals and nursing note reviewed.  Skin:    Findings: Rash present. Rash is nodular and papular.          Comments: Nodular lesion on anterior right shoulder with central ulcer.   Diffuse macular lesions on torso, legs and arms: with excoriation marks  Neurological:     Mental Status: She is alert.    BP 110/82 (BP Location: Left Arm, Patient Position: Sitting, Cuff Size: Normal)   Pulse 71   Temp 98.7 F (37.1 C) (Temporal)   Resp 16   Ht 5\' 4"  (1.626 m)   Wt 98 lb 6.4 oz (44.6 kg)   SpO2 96%   BMI 16.89 kg/m    No results found for any visits on 06/08/22.  Skin excision-Shave Biopsy  Date/Time: 06/08/2022 2:00 PM  Performed by: Anne Ng, NP Authorized by: Anne Ng, NP   Number of Lesions: 1 Lesion 1:    Body area: upper extremity   Upper extremity location: R shoulder (anterior)   Initial size (mm): 6  Malignancy: malignancy unknown     Wound repair type: no closure required.  Comments:  Procedure including risks/benefits explained to patient.   Questions were answered.  After informed consent was obtained and a time out completed. Site was cleansed with betadine and then alcohol. 1% Lidocaine with epinephrine was injected under lesion and then shave biopsy was performed. Area was cauterized to obtain hemostasis.  Pt tolerated procedure well.  Specimen sent for pathology review.  Pt instructed to keep the area dry for 24 hours and to contact us if he develops redness, drainage or swelling at the site.  Pt may use tylenol as needed for discomfort today.          Assessment & Plan:   Problem List Items Addressed This Visit       Other   Depression (Chronic)    Stable mood with lexapro Refill sent      Relevant Medications   escitalopram (LEXAPRO) 20 MG tablet   Other Visit Diagnoses     Pigmented skin lesion suspicious for malignant neoplasm    -  Primary   Relevant Orders   Pathology   Skin excision   Multiple insect bites       Relevant Medications   permethrin (ELIMITE) 5 % cream   GAD (generalized anxiety disorder)       Relevant Medications   escitalopram (LEXAPRO) 20 MG tablet      Meds ordered this encounter  Medications   permethrin (ELIMITE) 5 % cream    Sig: Apply and leave on for 8hrs, then wash    Dispense:  60 g    Refill:  1    Order Specific Question:   Supervising Provider    Answer:   Nadene Rubins ALFRED [[id number]]   escitalopram (LEXAPRO) 20 MG tablet    Sig: TAKE 1 TABLET BY MOUTH DAILY.    Dispense:  90 tablet    Refill:  3    Order Specific Question:   Supervising Provider    Answer:   Mliss Sax [[id number]]  Advised to check home for bedbugs. Lesion on right shoulder removed due to concern about possible malignancy.  Return in about 4 weeks (around 07/06/2022) for CPE (fasting).  Alysia Penna, NP

## 2022-06-09 ENCOUNTER — Other Ambulatory Visit: Payer: Medicare Other

## 2022-06-09 ENCOUNTER — Telehealth: Payer: Self-pay | Admitting: Nurse Practitioner

## 2022-06-09 NOTE — Telephone Encounter (Signed)
Called Stacie [name] back and told her where the biopsy came from

## 2022-06-09 NOTE — Telephone Encounter (Signed)
[facility]/ Victorino Dike [phone]  They need the source, site where the biopsy came from. Please call to clarify.

## 2022-06-11 LAB — PATHOLOGY REPORT

## 2022-06-11 NOTE — Addendum Note (Signed)
Addended by: Alysia Penna L on: 06/11/2022 03:40 PM   Modules accepted: Orders

## 2022-06-15 ENCOUNTER — Other Ambulatory Visit: Payer: Self-pay | Admitting: Nurse Practitioner

## 2022-06-15 DIAGNOSIS — F3342 Major depressive disorder, recurrent, in full remission: Secondary | ICD-10-CM

## 2022-06-15 DIAGNOSIS — F411 Generalized anxiety disorder: Secondary | ICD-10-CM

## 2022-06-17 ENCOUNTER — Ambulatory Visit: Payer: Medicare Other | Admitting: Dermatology

## 2022-06-17 ENCOUNTER — Encounter: Payer: Self-pay | Admitting: Dermatology

## 2022-06-17 DIAGNOSIS — L309 Dermatitis, unspecified: Secondary | ICD-10-CM

## 2022-06-17 DIAGNOSIS — L300 Nummular dermatitis: Secondary | ICD-10-CM | POA: Diagnosis not present

## 2022-06-17 DIAGNOSIS — L578 Other skin changes due to chronic exposure to nonionizing radiation: Secondary | ICD-10-CM | POA: Diagnosis not present

## 2022-06-17 DIAGNOSIS — D229 Melanocytic nevi, unspecified: Secondary | ICD-10-CM

## 2022-06-17 DIAGNOSIS — C44622 Squamous cell carcinoma of skin of right upper limb, including shoulder: Secondary | ICD-10-CM | POA: Diagnosis not present

## 2022-06-17 DIAGNOSIS — Z808 Family history of malignant neoplasm of other organs or systems: Secondary | ICD-10-CM | POA: Diagnosis not present

## 2022-06-17 DIAGNOSIS — X32XXXA Exposure to sunlight, initial encounter: Secondary | ICD-10-CM

## 2022-06-17 DIAGNOSIS — L814 Other melanin hyperpigmentation: Secondary | ICD-10-CM | POA: Diagnosis not present

## 2022-06-17 DIAGNOSIS — L821 Other seborrheic keratosis: Secondary | ICD-10-CM | POA: Diagnosis not present

## 2022-06-17 DIAGNOSIS — W908XXA Exposure to other nonionizing radiation, initial encounter: Secondary | ICD-10-CM

## 2022-06-17 DIAGNOSIS — D1801 Hemangioma of skin and subcutaneous tissue: Secondary | ICD-10-CM

## 2022-06-17 DIAGNOSIS — Z1283 Encounter for screening for malignant neoplasm of skin: Secondary | ICD-10-CM | POA: Diagnosis not present

## 2022-06-17 DIAGNOSIS — C4492 Squamous cell carcinoma of skin, unspecified: Secondary | ICD-10-CM

## 2022-06-17 MED ORDER — TRIAMCINOLONE ACETONIDE 0.1 % EX CREA: 1.0000 | TOPICAL_CREAM | Freq: Two times a day (BID) | CUTANEOUS | 11 refills | Status: DC

## 2022-06-17 MED ORDER — TRIAMCINOLONE ACETONIDE 0.1 % EX CREA: 1.0000 | TOPICAL_CREAM | Freq: Two times a day (BID) | CUTANEOUS | 0 refills | Status: DC

## 2022-06-17 NOTE — Progress Notes (Signed)
New Patient Visit   Subjective  Stacie [name] is a 67 y.o. female who presents for the following: Skin Cancer Screening and Waist up skin exam  Patients PCP did a biopsy of lesion on right shoulder on 06/08/22. Report shows SCC. Has never has a FBSE. Dermatologist did a biopsy of spots on legs. Dermatitis was the diagnosis. Triamcinolone was given which helped. Spot of concern is on the right ear. Father has hx of skin cancer.   The patient presents for Total-Body Skin Exam (TBSE) for skin cancer screening and mole check. The patient has spots, moles and lesions to be evaluated, some may be new or changing and the patient has concerns that these could be cancer.    The following portions of the chart were reviewed this encounter and updated as appropriate: medications, allergies, medical history  Review of Systems:  No other skin or systemic complaints except as noted in HPI or Assessment and Plan.  Objective  Well appearing patient in no apparent distress; mood and affect are within normal limits.  A full examination was performed including scalp, head, eyes, ears, nose, lips, neck, chest, axillae, abdomen, back, buttocks, bilateral upper extremities, bilateral lower extremities, hands, feet, fingers, toes, fingernails, and toenails. All findings within normal limits unless otherwise noted below.   Relevant physical exam findings are noted in the Assessment and Plan.    Assessment & Plan   LENTIGINES, SEBORRHEIC KERATOSES, HEMANGIOMAS - Benign normal skin lesions - Benign-appearing - Call for any changes  MELANOCYTIC NEVI - Tan-brown and/or pink-flesh-colored symmetric macules and papules - Benign appearing on exam today - Observation - Call clinic for new or changing moles - Recommend daily use of broad spectrum spf 30+ sunscreen to sun-exposed areas.   ACTINIC DAMAGE - Chronic condition, secondary to cumulative UV/sun exposure - diffuse scaly erythematous  macules with underlying dyspigmentation - Recommend daily broad spectrum sunscreen SPF 30+ to sun-exposed areas, reapply every 2 hours as needed.  - Staying in the shade or wearing long sleeves, sun glasses (UVA+UVB protection) and wide brim hats (4-inch brim around the entire circumference of the hat) are also recommended for sun protection.  - Call for new or changing lesions.  SKIN CANCER SCREENING PERFORMED TODAY.   Dermatitis (Nummular Eczema)  Exam: Scaly pink papules coalescing to plaques  Treatment Plan: -Triamcinolone to use twice a day for up to two weeks, must stop after 2 weeks but can restart for future flares -Discussed gentle skin care and avoiding irritants such as fragrance  Biopsy Proven SCC Exam: well healed scar at bx site on right shoulder        Component 1 mo ago   Relevant History:   Comment: ICD-10: L81.9   Pathologist:   Comment: Marisa C. Clementeen Graham, MD, Board Certification in Anatomic/Clinical Pathology and Hematopathology Electronically Signed   A Source   Comment: Skin, right shoulder, biopsy:   A Gross Description   Comment: Specimen received in 10% neutral buffered formalin, labeled with the patient's name, DOB and "no source indicated"(later indicated to be right shoulder per troubleshooting) and consists of a 0.9 x 0.9 x 0.3 cm shave biopsy. The skin surface displays a scaly pink and brown nodule measuring 0.9 x 0.9 cm. The specimen is inked red, trisected and entirely submitted in 1 cassette(s), labeled A1.   A Diagnosis   Comment: - Squamous cell carcinoma, to the deep edge.      Treatment Plan: -Pathology results reviewed. -Discussed results and treatment options  with patient -Will schedule a SE with me in the next few weeks.     Return Schedule 12:30 surgery any day except monday.    Documentation: I have reviewed the above documentation for accuracy and completeness, and I agree with the above.  Langston Reusing,  DO  I, Germaine Pomfret, CMA, am acting as scribe for [employer], DO.

## 2022-06-17 NOTE — Patient Instructions (Signed)
Due to recent changes in healthcare laws, you may see results of your pathology and/or laboratory studies on MyChart before the doctors have had a chance to review them. We understand that in some cases there may be results that are confusing or concerning to you. Please understand that not all results are received at the same time and often the doctors may need to interpret multiple results in order to provide you with the best plan of care or course of treatment. Therefore, we ask that you please give us 2 business days to thoroughly review all your results before contacting the office for clarification. Should we see a critical lab result, you will be contacted sooner.   If You Need Anything After Your Visit  If you have any questions or concerns for your doctor, please call our main line at [phone] If no one answers, please leave a voicemail as directed and we will return your call as soon as possible. Messages left after 4 pm will be answered the following business day.   You may also send us a message via MyChart. We typically respond to MyChart messages within 1-2 business days.  For prescription refills, please ask your pharmacy to contact our office. Our fax number is [phone].  If you have an urgent issue when the clinic is closed that cannot wait until the next business day, you can page your doctor at the number below.    Please note that while we do our best to be available for urgent issues outside of office hours, we are not available 24/7.   If you have an urgent issue and are unable to reach us, you may choose to seek medical care at your doctor's office, retail clinic, urgent care center, or emergency room.  If you have a medical emergency, please immediately call 911 or go to the emergency department. In the event of inclement weather, please call our main line at [phone] for an update on the status of any delays or closures.  Dermatology Medication Tips: Please  keep the boxes that topical medications come in in order to help keep track of the instructions about where and how to use these. Pharmacies typically print the medication instructions only on the boxes and not directly on the medication tubes.   If your medication is too expensive, please contact our office at [phone] or send us a message through MyChart.   We are unable to tell what your co-pay for medications will be in advance as this is different depending on your insurance coverage. However, we may be able to find a substitute medication at lower cost or fill out paperwork to get insurance to cover a needed medication.   If a prior authorization is required to get your medication covered by your insurance company, please allow us 1-2 business days to complete this process.  Drug prices often vary depending on where the prescription is filled and some pharmacies may offer cheaper prices.  The website www.goodrx.com contains coupons for medications through different pharmacies. The prices here do not account for what the cost may be with help from insurance (it may be cheaper with your insurance), but the website can give you the price if you did not use any insurance.  - You can print the associated coupon and take it with your prescription to the pharmacy.  - You may also stop by our office during regular business hours and pick up a GoodRx coupon card.  - If you need your   prescription sent electronically to a different pharmacy, notify our office through [facility] My[facility] or by phone at [phone]     

## 2022-06-17 NOTE — Progress Notes (Deleted)
   New Patient Visit   Subjective  Stacie [name] is a 67 y.o. female who presents for the following: SCC of right shoulder  Patients PCP did a biopsy of lesion on right shoulder on 06/08/22. Report shows SCC. Has never has a FBSE. Dermatologist did a biopsy of spots on legs. Dermatitis was the diagnosis. Triamcinolone was given which helped. Spot of concern is on the right ear. Father has hx of skin cancer.   The following portions of the chart were reviewed this encounter and updated as appropriate: medications, allergies, medical history  Review of Systems:  No other skin or systemic complaints except as noted in HPI or Assessment and Plan.  Objective  Well appearing patient in no apparent distress; mood and affect are within normal limits.  A focused examination was performed of the following areas: Right shoulder and Right Ear   Relevant exam findings are noted in the Assessment and Plan.    Assessment & Plan       No follow-ups on file.  ***  Documentation: I have reviewed the above documentation for accuracy and completeness, and I agree with the above.  Langston Reusing, DO  I, Germaine Pomfret, CMA, am acting as scribe for [employer], DO.

## 2022-07-08 ENCOUNTER — Encounter: Payer: Self-pay | Admitting: Dermatology

## 2022-07-08 ENCOUNTER — Ambulatory Visit (INDEPENDENT_AMBULATORY_CARE_PROVIDER_SITE_OTHER): Payer: Medicare Other | Admitting: Dermatology

## 2022-07-08 DIAGNOSIS — C4492 Squamous cell carcinoma of skin, unspecified: Secondary | ICD-10-CM

## 2022-07-08 DIAGNOSIS — C44622 Squamous cell carcinoma of skin of right upper limb, including shoulder: Secondary | ICD-10-CM | POA: Diagnosis not present

## 2022-07-08 DIAGNOSIS — L309 Dermatitis, unspecified: Secondary | ICD-10-CM | POA: Diagnosis not present

## 2022-07-08 MED ORDER — MUPIROCIN 2 % EX OINT
1.0000 | TOPICAL_OINTMENT | Freq: Every day | CUTANEOUS | 0 refills | Status: DC
Start: 2022-07-08 — End: 2022-10-14

## 2022-07-08 MED ORDER — DOXYCYCLINE MONOHYDRATE 100 MG PO CAPS
100.0000 mg | ORAL_CAPSULE | Freq: Two times a day (BID) | ORAL | 0 refills | Status: DC
Start: 2022-07-08 — End: 2022-10-14

## 2022-07-08 NOTE — Patient Instructions (Addendum)
AFTER- CARE OF SURGERY SITE 1. Leave the current dressing on for 18-24 hours and do not get it wet during that time. 2. After 24 hours wash site normally with mild soap/cleanser. 3. Apply a small amount of Mupirocin Ointment (This is a prescription sent to your local pharmacy) to the area. 4. Cover with a bandage using a non stick gauze pad and paper tape or Band Aid(s). 5. Repeat this twice each day for about 7 days, or as instructed by your provider. 6. Take the 100mg  Doxycyline every morning and evening with a bid meal for 5 days. Doxycycline should be taken with food to prevent nausea. Do not lay down for 30 minutes after taking. Be cautious with sun exposure and use good sun protection while on this medication. Pregnant women should not take this medication.   Should bleeding occur, apply pressure for 10-15 minutes to the site. If bleeding continues, apply pressure again  for 10-15 minutes. If bleeding persists despite applying consistent pressure, call the office or go to the nearest emergency room.  Call the office for any questions or concerns. NOTE: Pathology results will be reviewed at the time the sutures (stitches) are removed or you will be notified with  the results (which usually takes 7 days)  Due to recent changes in healthcare laws, you may see results of your pathology and/or laboratory studies on MyChart before the doctors have had a chance to review them. We understand that in some cases there may be results that are confusing or concerning to you. Please understand that not all results are received at the same time and often the doctors may need to interpret multiple results in order to provide you with the best plan of care or course of treatment. Therefore, we ask that you please give [place] 2 business days to thoroughly review all your results before contacting the office for clarification. Should we see a critical lab result, you will be contacted sooner.   If You  Need Anything After Your Visit  If you have any questions or concerns for your doctor, please call our main line at [phone] If no one answers, please leave a voicemail as directed and we will return your call as soon as possible. Messages left after 4 pm will be answered the following business day.   You may also send [place] a message via MyChart. We typically respond to MyChart messages within 1-2 business days.  For prescription refills, please ask your pharmacy to contact our office. Our fax number is [phone].  If you have an urgent issue when the clinic is closed that cannot wait until the next business day, you can page your doctor at the number below.    Please note that while we do our best to be available for urgent issues outside of office hours, we are not available 24/7.   If you have an urgent issue and are unable to reach [place], you may choose to seek medical care at your doctor's office, retail clinic, urgent care center, or emergency room.  If you have a medical emergency, please immediately call 911 or go to the emergency department. In the event of inclement weather, please call our main line at [phone] for an update on the status of any delays or closures.  Dermatology Medication Tips: Please keep the boxes that topical medications come in in order to help keep track of the instructions about where and how to use these. Pharmacies typically print the medication instructions only  on the boxes and not directly on the medication tubes.   If your medication is too expensive, please contact our office at [phone] or send [place] a message through MyChart.   We are unable to tell what your co-pay for medications will be in advance as this is different depending on your insurance coverage. However, we may be able to find a substitute medication at lower cost or fill out paperwork to get insurance to cover a needed medication.   If a prior authorization is required to get  your medication covered by your insurance company, please allow [place] 1-2 business days to complete this process.  Drug prices often vary depending on where the prescription is filled and some pharmacies may offer cheaper prices.  The website www.goodrx.com contains coupons for medications through different pharmacies. The prices here do not account for what the cost may be with help from insurance (it may be cheaper with your insurance), but the website can give you the price if you did not use any insurance.  - You can print the associated coupon and take it with your prescription to the pharmacy.  - You may also stop by our office during regular business hours and pick up a GoodRx coupon card.  - If you need your prescription sent electronically to a different pharmacy, notify our office through [facility] or by phone at [phone]   Supplies for surgical site care: 1. Non stick gauze pads and Band Aids 2. Mild soap/cleanser ([address] Lever 2000, Cetaphil, etc.) 3. Paper tape 4. Prescription Mupirocin Ointment or  Aquaphor healing ointment if you're waiting for your prescription.

## 2022-07-08 NOTE — Progress Notes (Signed)
Follow-Up Visit   Subjective  Stacie [name] is a 67 y.o. female who presents for the following: Excision of biopsy proven SCC on right shoulder.   The following portions of the chart were reviewed this encounter and updated as appropriate: medications, allergies, medical history  Review of Systems:  No other skin or systemic complaints except as noted in HPI or Assessment and Plan.  Objective  Well appearing patient in no apparent distress; mood and affect are within normal limits.  A focused examination was performed of the following areas: Right shoulder  Relevant physical exam findings are noted in the Assessment and Plan.   Right Shoulder 1cm healing pink scar     Assessment & Plan   Squamous cell carcinoma of skin  Related Medications doxycycline (MONODOX) 100 MG capsule Take 1 capsule (100 mg total) by mouth 2 (two) times daily. TAKE WITH FOOD  mupirocin ointment (BACTROBAN) 2 % Apply 1 Application topically daily. With dressing changes  SCC (squamous cell carcinoma), shoulder, right Right Shoulder  Skin excision  Lesion length (cm):  1 Lesion width (cm):  1 Margin per side (cm):  0.4 Total excision diameter (cm):  1.8 Informed consent: discussed and consent obtained   Timeout: patient name, date of birth, surgical site, and procedure verified   Procedure prep:  Patient was prepped and draped in usual sterile fashion Prep type:  Isopropyl alcohol and chlorhexidine Anesthesia: the lesion was anesthetized in a standard fashion   Anesthetic:  1% lidocaine w/ epinephrine 1-100,000 buffered w/ 8.4% NaHCO3 (9cc total) Instrument used: #15 blade   Hemostasis achieved with: pressure and electrodesiccation   Outcome: patient tolerated procedure well with no complications    Skin repair Complexity:  Complex Final length (cm):  5 Informed consent: discussed and consent obtained   Timeout: patient name, date of birth, surgical site, and procedure  verified   Procedure prep:  Patient was prepped and draped in usual sterile fashion Prep type:  Povidone-iodine Anesthesia: the lesion was anesthetized in a standard fashion   Anesthetic:  1% lidocaine w/ epinephrine 1-100,000 buffered w/ 8.4% NaHCO3 Reason for type of repair: reduce tension to allow closure, reduce the risk of dehiscence, infection, and necrosis, reduce subcutaneous dead space and avoid a hematoma, allow closure of the large defect, preserve normal anatomy, preserve normal anatomical and functional relationships and enhance both functionality and cosmetic results   Undermining: edges undermined and area extensively undermined   Subcutaneous layers (deep stitches):  Suture size:  4-0 Suture type: Vicryl (polyglactin 910)   Stitches:  Buried vertical mattress (Inverted dermal) Fine/surface layer approximation (top stitches):  Suture size:  3-0 Suture type: nylon   Suture type comment:  Nylon Stitches: horizontal mattress   Stitches comment:  Nylon Suture removal (days):  14 Hemostasis achieved with: suture and pressure Hemostasis achieved with comment:  Electrocautery Outcome: patient tolerated procedure well with no complications   Post-procedure details: sterile dressing applied and wound care instructions given   Dressing type: pressure dressing and bandage (Mupirocin)   Additional details:  Undermining Defect measured 1 cm  Specimen 1 - Surgical pathology Differential Diagnosis: Bx proven SCC  Check Margins: Yes Pink Biopsy Site  [facility] Order: [id number]  Start Doxycyline 100mg , BID WITH FOOD for 5 days Doxycycline should be taken with food to prevent nausea. Do not lay down for 30 minutes after taking. Be cautious with sun exposure and use good sun protection while on this medication. Pregnant women should not take this medication.  Return in about 2 weeks (around 07/22/2022) for Suture Removal.  I, Lawson Radar, CMA, am acting as scribe for Langston Reusing, DO.   Documentation: I have reviewed the above documentation for accuracy and completeness, and I agree with the above.  Langston Reusing, DO

## 2022-07-09 DIAGNOSIS — C44622 Squamous cell carcinoma of skin of right upper limb, including shoulder: Secondary | ICD-10-CM

## 2022-07-09 HISTORY — DX: Squamous cell carcinoma of skin of right upper limb, including shoulder: C44.622

## 2022-07-14 NOTE — Telephone Encounter (Signed)
-----   Message from Terri Piedra, DO sent at 07/14/2022 10:37 AM EDT ----- Please notify pt that the S.E. of her SCC showed clear margins.   Diagnosis Skin (M), right shoulder EXCISION, NO RESIDUAL SQUAMOUS CELL CARCINOMA, MARGINS FREE

## 2022-07-20 ENCOUNTER — Telehealth: Payer: Self-pay

## 2022-07-20 NOTE — Telephone Encounter (Signed)
-----   Message from Jennifer N David, DO sent at 07/14/2022 10:37 AM EDT ----- Please notify pt that the S.E. of her SCC showed clear margins.   Diagnosis Skin (M), right shoulder EXCISION, NO RESIDUAL SQUAMOUS CELL CARCINOMA, MARGINS FREE 

## 2022-07-20 NOTE — Telephone Encounter (Signed)
I left a detailed message on her voicemail since she has not called back or responded to My Chart. OK per her DPR

## 2022-07-22 ENCOUNTER — Encounter: Payer: Self-pay | Admitting: Dermatology

## 2022-07-22 ENCOUNTER — Ambulatory Visit: Payer: Medicare Other | Admitting: Dermatology

## 2022-07-22 VITALS — BP 124/81 | HR 64

## 2022-07-22 DIAGNOSIS — C44722 Squamous cell carcinoma of skin of right lower limb, including hip: Secondary | ICD-10-CM

## 2022-07-22 DIAGNOSIS — C4492 Squamous cell carcinoma of skin, unspecified: Secondary | ICD-10-CM

## 2022-07-22 DIAGNOSIS — D485 Neoplasm of uncertain behavior of skin: Secondary | ICD-10-CM

## 2022-07-22 DIAGNOSIS — Z9889 Other specified postprocedural states: Secondary | ICD-10-CM | POA: Insufficient documentation

## 2022-07-22 HISTORY — DX: Squamous cell carcinoma of skin, unspecified: C44.92

## 2022-07-22 NOTE — Patient Instructions (Signed)
Patient Handout: Wound Care for Skin Biopsy Site  Patient Handout: Wound Care for Skin Biopsy Site  Taking Care of Your Skin Biopsy Site  Proper care of the biopsy site is essential for promoting healing and minimizing scarring. This handout provides instructions on how to care for your biopsy site to ensure optimal recovery.  1. Cleaning the Wound:  Clean the biopsy site daily with gentle soap and water. Gently pat the area dry with a clean, soft towel. Avoid harsh scrubbing or rubbing the area, as this can irritate the skin and delay healing.  2. Applying Aquaphor and Bandage:  After cleaning the wound, apply a thin layer of Aquaphor ointment to the biopsy site. Cover the area with a sterile bandage to protect it from dirt, bacteria, and friction. Change the bandage daily or as needed if it becomes soiled or wet.  3. Continued Care for One Week:  Repeat the cleaning, Aquaphor application, and bandaging process daily for one week following the biopsy procedure. Keeping the wound clean and moist during this initial healing period will help prevent infection and promote optimal healing.  4. Massaging Aquaphor into the Area:  ---After one week, discontinue the use of bandages but continue to apply Aquaphor to the biopsy site. ----Gently massage the Aquaphor into the area using circular motions. ---Massaging the skin helps to promote circulation and prevent the formation of scar tissue.   Additional Tips:  Avoid exposing the biopsy site to direct sunlight during the healing process, as this can cause hyperpigmentation or worsen scarring. If you experience any signs of infection, such as increased redness, swelling, warmth, or drainage from the wound, contact your healthcare provider immediately. Follow any additional instructions provided by your healthcare provider for caring for the biopsy site and managing any discomfort. Conclusion:  Taking proper care of your skin biopsy site  is crucial for ensuring optimal healing and minimizing scarring. By following these instructions for cleaning, applying Aquaphor, and massaging the area, you can promote a smooth and successful recovery. If you have any questions or concerns about caring for your biopsy site, don't hesitate to contact your healthcare provider for guidance.      Due to recent changes in healthcare laws, you may see results of your pathology and/or laboratory studies on MyChart before the doctors have had a chance to review them. We understand that in some cases there may be results that are confusing or concerning to you. Please understand that not all results are received at the same time and often the doctors may need to interpret multiple results in order to provide you with the best plan of care or course of treatment. Therefore, we ask that you please give us 2 business days to thoroughly review all your results before contacting the office for clarification. Should we see a critical lab result, you will be contacted sooner.   If You Need Anything After Your Visit  If you have any questions or concerns for your doctor, please call our main line at [phone] If no one answers, please leave a voicemail as directed and we will return your call as soon as possible. Messages left after 4 pm will be answered the following business day.   You may also send us a message via MyChart. We typically respond to MyChart messages within 1-2 business days.  For prescription refills, please ask your pharmacy to contact our office. Our fax number is [phone].  If you have an urgent issue when the clinic is   closed that cannot wait until the next business day, you can page your doctor at the number below.    Please note that while we do our best to be available for urgent issues outside of office hours, we are not available 24/7.   If you have an urgent issue and are unable to reach us, you may choose to seek medical care  at your doctor's office, retail clinic, urgent care center, or emergency room.  If you have a medical emergency, please immediately call 911 or go to the emergency department. In the event of inclement weather, please call our main line at [phone] for an update on the status of any delays or closures.  Dermatology Medication Tips: Please keep the boxes that topical medications come in in order to help keep track of the instructions about where and how to use these. Pharmacies typically print the medication instructions only on the boxes and not directly on the medication tubes.   If your medication is too expensive, please contact our office at [phone] or send us a message through MyChart.   We are unable to tell what your co-pay for medications will be in advance as this is different depending on your insurance coverage. However, we may be able to find a substitute medication at lower cost or fill out paperwork to get insurance to cover a needed medication.   If a prior authorization is required to get your medication covered by your insurance company, please allow us 1-2 business days to complete this process.  Drug prices often vary depending on where the prescription is filled and some pharmacies may offer cheaper prices.  The website www.goodrx.com contains coupons for medications through different pharmacies. The prices here do not account for what the cost may be with help from insurance (it may be cheaper with your insurance), but the website can give you the price if you did not use any insurance.  - You can print the associated coupon and take it with your prescription to the pharmacy.  - You may also stop by our office during regular business hours and pick up a GoodRx coupon card.  - If you need your prescription sent electronically to a different pharmacy, notify our office through [facility] MyChart or by phone at [phone]     

## 2022-07-22 NOTE — Progress Notes (Signed)
   Follow-Up Visit   Subjective  Stacie [name] is a 67 y.o. female who presents for the following: suture removal to right shoulder. SCC margins free. Pt has a new spot on right lower leg she would like to have examined which popped up recently and growing quickly.  The following portions of the chart were reviewed this encounter and updated as appropriate: medications, allergies, medical history  Review of Systems:  No other skin or systemic complaints except as noted in HPI or Assessment and Plan.  Objective  Well appearing patient in no apparent distress; mood and affect are within normal limits.   A focused examination was performed of the following areas:   Relevant exam findings are noted in the Assessment and Plan.  Right Lower Leg - Anterior 1.0 cm pink nodule w central crusting         Assessment & Plan   Encounter for Removal of Sutures - Incision site at the right shoulder is clean, dry and intact - Wound cleansed, sutures removed, wound cleansed and steri strips applied.  - Discussed pathology results showing SCC margins free.  - Patient advised to keep steri-strips dry until they fall off. - Scars remodel for a full year. - Once steri-strips fall off, patient can apply over-the-counter silicone scar cream each night to help with scar remodeling if desired. - Patient advised to call with any concerns or if they notice any new or changing lesions.   Neoplasm of uncertain behavior of skin Right Lower Leg - Anterior  Skin / nail biopsy  Informed consent: discussed and consent obtained   Timeout: patient name, date of birth, surgical site, and procedure verified   Procedure prep:  Patient was prepped and draped in usual sterile fashion Prep type:  Isopropyl alcohol Anesthesia: the lesion was anesthetized in a standard fashion   Anesthetic:  1% lidocaine w/ epinephrine 1-100,000 buffered w/ 8.4% NaHCO3 Hemostasis achieved with: aluminum chloride    Outcome: patient tolerated procedure well   Post-procedure details: sterile dressing applied and wound care instructions given   Dressing type: petrolatum gauze and bandage    Specimen 1 - Surgical pathology Differential Diagnosis: SCC vs other  Check Margins: No  I counseled the patient regarding the following: Instructions: Neoplasms of Uncertain Behavior can be observed, biopsied or surgically removed depending on the level of clinical suspicion.     Return if symptoms worsen or fail to improve.  Owens Shark, CMA, am acting as scribe for [employer], DO.   Documentation: I have reviewed the above documentation for accuracy and completeness, and I agree with the above.  Langston Reusing, DO

## 2022-07-23 ENCOUNTER — Encounter: Payer: Self-pay | Admitting: Dermatology

## 2022-07-27 ENCOUNTER — Telehealth: Payer: Self-pay

## 2022-07-27 NOTE — Progress Notes (Signed)
Please call patient and notify that results of biopsy were positive for a skin cancer that needs to be treated with Mohs Surgery.  Diagnosis Skin , right lower leg - anterior WELL DIFFERENTIATED SQUAMOUS CELL CARCINOMA  Due to size and location, We will refer to Dr. Jeannine Boga at [facility]  [facility] [address] [place], [address] [place], [place] [address]  Phone: [phone] Fax: [phone]

## 2022-07-27 NOTE — Telephone Encounter (Signed)
-----   Message from Terri Piedra, DO sent at 07/27/2022  4:55 PM EDT ----- Please call patient and notify that results of biopsy were positive for a skin cancer that needs to be treated with Mohs Surgery.  Diagnosis Skin , right lower leg - anterior WELL DIFFERENTIATED SQUAMOUS CELL CARCINOMA  Due to size and location, We will refer to Dr. Jeannine Boga at [facility]  [facility] [address] [place], [address] [place], [place] [address]  Phone: [phone] Fax: [phone]

## 2022-07-27 NOTE — Telephone Encounter (Signed)
I left a message for her to call back. 

## 2022-07-28 ENCOUNTER — Telehealth: Payer: Self-pay

## 2022-07-28 NOTE — Telephone Encounter (Signed)
I left a message asking her to call back.

## 2022-07-28 NOTE — Telephone Encounter (Signed)
-----   Message from Jennifer N David, DO sent at 07/27/2022  4:55 PM EDT ----- Please call patient and notify that results of biopsy were positive for a skin cancer that needs to be treated with Mohs Surgery.  Diagnosis Skin , right lower leg - anterior WELL DIFFERENTIATED SQUAMOUS CELL CARCINOMA  Due to size and location, We will refer to Dr. Mit[facility] at [facility]  [facility] [address], [address] [place], [place] [address]  Phone: [phone] Fax: [phone]  

## 2022-07-28 NOTE — Telephone Encounter (Addendum)
I received a voicemail from her daughter Toni Amend. I tried to call her back at [phone]. I keft a message that I will try her again tomorrow.    ----- Message from Terri Piedra, DO sent at 07/27/2022  4:55 PM EDT ----- Please call patient and notify that results of biopsy were positive for a skin cancer that needs to be treated with Mohs Surgery.  Diagnosis Skin , right lower leg - anterior WELL DIFFERENTIATED SQUAMOUS CELL CARCINOMA  Due to size and location, We will refer to Dr. Jeannine Boga at [facility]  [facility] [address] [place], [address] [place], [place] [address]  Phone: [phone] Fax: [phone]

## 2022-08-03 ENCOUNTER — Telehealth: Payer: Self-pay

## 2022-08-03 ENCOUNTER — Other Ambulatory Visit: Payer: Self-pay

## 2022-08-03 DIAGNOSIS — C44722 Squamous cell carcinoma of skin of right lower limb, including hip: Secondary | ICD-10-CM

## 2022-08-03 NOTE — Telephone Encounter (Signed)
I was able to reach her daughter Stacie [name] today. Pascha is aware of the results and aware she is being referred to [facility] for MOHS. Daughter is listed on HIPPA

## 2022-08-04 ENCOUNTER — Other Ambulatory Visit: Payer: Self-pay

## 2022-08-04 DIAGNOSIS — C4492 Squamous cell carcinoma of skin, unspecified: Secondary | ICD-10-CM

## 2022-08-17 ENCOUNTER — Telehealth: Payer: Self-pay

## 2022-08-17 DIAGNOSIS — C44722 Squamous cell carcinoma of skin of right lower limb, including hip: Secondary | ICD-10-CM | POA: Diagnosis not present

## 2022-08-17 NOTE — Telephone Encounter (Signed)
Left patient a detailed voice message regarding scheduling AWV & mobile mammogram and to return call to office for scheduling.

## 2022-09-16 DIAGNOSIS — Z48817 Encounter for surgical aftercare following surgery on the skin and subcutaneous tissue: Secondary | ICD-10-CM | POA: Diagnosis not present

## 2022-10-14 ENCOUNTER — Encounter: Payer: Self-pay | Admitting: Nurse Practitioner

## 2022-10-14 ENCOUNTER — Ambulatory Visit (INDEPENDENT_AMBULATORY_CARE_PROVIDER_SITE_OTHER): Payer: Medicare Other | Admitting: Nurse Practitioner

## 2022-10-14 VITALS — BP 146/74 | HR 73 | Temp 97.7°F | Wt 100.2 lb

## 2022-10-14 DIAGNOSIS — I5032 Chronic diastolic (congestive) heart failure: Secondary | ICD-10-CM

## 2022-10-14 DIAGNOSIS — R739 Hyperglycemia, unspecified: Secondary | ICD-10-CM

## 2022-10-14 DIAGNOSIS — D539 Nutritional anemia, unspecified: Secondary | ICD-10-CM | POA: Insufficient documentation

## 2022-10-14 DIAGNOSIS — Z87891 Personal history of nicotine dependence: Secondary | ICD-10-CM

## 2022-10-14 DIAGNOSIS — E43 Unspecified severe protein-calorie malnutrition: Secondary | ICD-10-CM

## 2022-10-14 DIAGNOSIS — I10 Essential (primary) hypertension: Secondary | ICD-10-CM | POA: Diagnosis not present

## 2022-10-14 DIAGNOSIS — I700 Atherosclerosis of aorta: Secondary | ICD-10-CM | POA: Diagnosis not present

## 2022-10-14 DIAGNOSIS — Z9889 Other specified postprocedural states: Secondary | ICD-10-CM | POA: Diagnosis not present

## 2022-10-14 DIAGNOSIS — Z859 Personal history of malignant neoplasm, unspecified: Secondary | ICD-10-CM

## 2022-10-14 DIAGNOSIS — Z1231 Encounter for screening mammogram for malignant neoplasm of breast: Secondary | ICD-10-CM

## 2022-10-14 DIAGNOSIS — R7989 Other specified abnormal findings of blood chemistry: Secondary | ICD-10-CM | POA: Diagnosis not present

## 2022-10-14 DIAGNOSIS — E782 Mixed hyperlipidemia: Secondary | ICD-10-CM | POA: Diagnosis not present

## 2022-10-14 DIAGNOSIS — Z780 Asymptomatic menopausal state: Secondary | ICD-10-CM | POA: Diagnosis not present

## 2022-10-14 DIAGNOSIS — F1010 Alcohol abuse, uncomplicated: Secondary | ICD-10-CM

## 2022-10-14 LAB — LIPID PANEL
Cholesterol: 177 mg/dL (ref 0–200)
HDL: 49.3 mg/dL (ref >39.00)
LDL Cholesterol: 98 mg/dL (ref 0–99)
NonHDL: 127.94
Total CHOL/HDL Ratio: 4
Triglycerides: 148 mg/dL (ref 0.0–149.0)
VLDL: 29.6 mg/dL (ref 0.0–40.0)

## 2022-10-14 LAB — COMPREHENSIVE METABOLIC PANEL
ALT: 9 U/L (ref 0–35)
AST: 14 U/L (ref 0–37)
Albumin: 4.2 g/dL (ref 3.5–5.2)
Alkaline Phosphatase: 81 U/L (ref 39–117)
BUN: 14 mg/dL (ref 6–23)
CO2: 28 meq/L (ref 19–32)
Calcium: 9.3 mg/dL (ref 8.4–10.5)
Chloride: 104 meq/L (ref 96–112)
Creatinine, Ser: 0.8 mg/dL (ref 0.40–1.20)
GFR: 76.28 mL/min (ref >60.00)
Glucose, Bld: 104 mg/dL — ABNORMAL HIGH (ref 70–99)
Potassium: 4.1 meq/L (ref 3.5–5.1)
Sodium: 140 meq/L (ref 135–145)
Total Bilirubin: 0.3 mg/dL (ref 0.2–1.2)
Total Protein: 6.7 g/dL (ref 6.0–8.3)

## 2022-10-14 LAB — HEMOGLOBIN A1C: Hgb A1c MFr Bld: 5.6 % (ref 4.6–6.5)

## 2022-10-14 MED ORDER — SIMVASTATIN 20 MG PO TABS
ORAL_TABLET | ORAL | 3 refills | Status: DC
Start: 2022-10-14 — End: 2022-10-15

## 2022-10-14 MED ORDER — ISOSORBIDE MONONITRATE ER 30 MG PO TB24
ORAL_TABLET | ORAL | 3 refills | Status: DC
Start: 2022-10-14 — End: 2023-09-29

## 2022-10-14 MED ORDER — HYDRALAZINE HCL 100 MG PO TABS: 100.0000 mg | ORAL_TABLET | Freq: Three times a day (TID) | ORAL | 3 refills | Status: DC

## 2022-10-14 MED ORDER — FOLIC ACID 1 MG PO TABS: 1.0000 mg | ORAL_TABLET | Freq: Every day | ORAL | 3 refills | Status: DC

## 2022-10-14 NOTE — Progress Notes (Signed)
Established Patient Visit  Patient: Stacie [name]   DOB: [birth year]   67 y.o. Female  MRN: [id number] Visit Date: 10/15/2022  Subjective:    Chief Complaint  Patient presents with   Spots on legs    PT's dermatologist informed her to follow up with PCP   HPI Accompanied by daughter-Megan.  Former tobacco use Quit 26yrs ago  Essential hypertension BP not at goal Admits to taking hydralazine 100mg  only BID, miss mid day dose. Reports she is compliant with amlodipine and imdur. BP Readings from Last 3 Encounters:  10/14/22 (!) 146/74  07/22/22 124/81  06/08/22 110/82    Advised about importance of med compliance and adequate BP control Repeat CMP Refill sent  Hyperlipidemia Repeat lipid panel: LDL not at goal-98 Stop zocor 20mg , start crestor 40mg  Repeat lipid panel in 3-89months (fasting)  Chronic diastolic CHF (congestive heart failure) (HCC) No SOB or CP or LE edema or cough Maintain imdur, amlodipine and hydralazine dose  Aortic atherosclerosis (HCC) Quit tobacco use 77yrs ago Current use of zocor Repeat lipid panel  Elevated TSH Repeat thyroid panel  Alcohol use disorder, mild, in controlled environment States she quit for 1year, but now drinks 1 mixed drink per month. Advised to avoid all alcoholic drinks due to risk of excessive use  History of squamous cell carcinoma excision Right shoulder and right Leg-excised She is concerned about another spot on Left LE-posterior. Advised to schedule another appointment with dermatology  Macrocytic anemia Resolved with folic acid supplement. Refill sent  Wt Readings from Last 3 Encounters:  10/14/22 100 lb 3.2 oz (45.5 kg)  06/08/22 98 lb 6.4 oz (44.6 kg)  10/09/21 85 lb 9.6 oz (38.8 kg)    Reviewed medical, surgical, and social history today  Medications: Outpatient Medications Prior to Visit  Medication Sig   amLODipine (NORVASC) 10 MG tablet Take 1 tablet (10 mg total) by mouth  daily.   escitalopram (LEXAPRO) 20 MG tablet TAKE 1 TABLET BY MOUTH DAILY.   Multiple Vitamin (MULTIVITAMIN WITH MINERALS) TABS tablet Take 1 tablet by mouth daily.   triamcinolone cream (KENALOG) 0.1 % Apply 1 Application topically 2 (two) times daily. Apply topically to the body twice a day for up to two weeks   [DISCONTINUED] folic acid (FOLVITE) 1 MG tablet Take 1 tablet (1 mg total) by mouth daily.   [DISCONTINUED] hydrALAZINE (APRESOLINE) 100 MG tablet Take 1 tablet (100 mg total) by mouth 3 (three) times daily.   [DISCONTINUED] isosorbide mononitrate (IMDUR) 30 MG 24 hr tablet TAKE 1 TABLET(30 MG) BY MOUTH DAILY   [DISCONTINUED] simvastatin (ZOCOR) 20 MG tablet TAKE 1 TABLET BY MOUTH EVERY DAY. NO FUTHER REFILLS UNTIL SEEN BY MD.   [DISCONTINUED] acetaminophen (TYLENOL) 325 MG tablet Take 2 tablets (650 mg total) by mouth every 4 (four) hours as needed for mild pain (or temp > 37.5 C (99.5 F)). (Patient not taking: Reported on 10/14/2022)   [DISCONTINUED] doxycycline (MONODOX) 100 MG capsule Take 1 capsule (100 mg total) by mouth 2 (two) times daily. TAKE WITH FOOD (Patient not taking: Reported on 10/14/2022)   [DISCONTINUED] mupirocin ointment (BACTROBAN) 2 % Apply 1 Application topically daily. With dressing changes (Patient not taking: Reported on 10/14/2022)   [DISCONTINUED] permethrin (ELIMITE) 5 % cream Apply and leave on for 8hrs, then wash (Patient not taking: Reported on 10/14/2022)   [DISCONTINUED] polyethylene glycol (MIRALAX / GLYCOLAX) 17 g packet Take 17  g by mouth daily as needed for mild constipation or moderate constipation. (Patient not taking: Reported on 10/14/2022)   No facility-administered medications prior to visit.   Reviewed past medical and social history.   ROS per HPI above      Objective:  BP (!) 146/74   Pulse 73   Temp 97.7 F (36.5 C) (Oral)   Wt 100 lb 3.2 oz (45.5 kg)   SpO2 96%   BMI 17.20 kg/m      Physical Exam Cardiovascular:     Rate and  Rhythm: Normal rate and regular rhythm.     Pulses: Normal pulses.     Heart sounds: Normal heart sounds.  Pulmonary:     Effort: Pulmonary effort is normal.     Breath sounds: Normal breath sounds.  Musculoskeletal:     Right lower leg: No edema.     Left lower leg: No edema.  Neurological:     Mental Status: She is alert and oriented to person, place, and time.     Results for orders placed or performed in visit on 10/14/22  Thyroid Panel With TSH  Result Value Ref Range   T3 Uptake 27 22 - 35 %   T4, Total 6.6 5.1 - 11.9 mcg/dL   Free Thyroxine Index 1.8 1.4 - 3.8   TSH 2.70 0.40 - 4.50 mIU/L  Comprehensive metabolic panel  Result Value Ref Range   Sodium 140 135 - 145 mEq/L   Potassium 4.1 3.5 - 5.1 mEq/L   Chloride 104 96 - 112 mEq/L   CO2 28 19 - 32 mEq/L   Glucose, Bld 104 (H) 70 - 99 mg/dL   BUN 14 6 - 23 mg/dL   Creatinine, Ser 1.61 0.40 - 1.20 mg/dL   Total Bilirubin 0.3 0.2 - 1.2 mg/dL   Alkaline Phosphatase 81 39 - 117 U/L   AST 14 0 - 37 U/L   ALT 9 0 - 35 U/L   Total Protein 6.7 6.0 - 8.3 g/dL   Albumin 4.2 3.5 - 5.2 g/dL   GFR 09.60 >45.40 mL/min   Calcium 9.3 8.4 - 10.5 mg/dL  Lipid panel  Result Value Ref Range   Cholesterol 177 0 - 200 mg/dL   Triglycerides 981.1 0.0 - 149.0 mg/dL   HDL 91.47 >82.95 mg/dL   VLDL 62.1 0.0 - 30.8 mg/dL   LDL Cholesterol 98 0 - 99 mg/dL   Total CHOL/HDL Ratio 4    NonHDL 127.94   Hemoglobin A1c  Result Value Ref Range   Hgb A1c MFr Bld 5.6 4.6 - 6.5 %      Assessment & Plan:    Problem List Items Addressed This Visit     Alcohol use disorder, mild, in controlled environment    States she quit for 1year, but now drinks 1 mixed drink per month. Advised to avoid all alcoholic drinks due to risk of excessive use      Aortic atherosclerosis (HCC)    Quit tobacco use 16yrs ago Current use of zocor Repeat lipid panel      Relevant Medications   hydrALAZINE (APRESOLINE) 100 MG tablet   isosorbide mononitrate  (IMDUR) 30 MG 24 hr tablet   rosuvastatin (CRESTOR) 40 MG tablet   Other Relevant Orders   Lipid panel (Completed)   Chronic diastolic CHF (congestive heart failure) (HCC) (Chronic)    No SOB or CP or LE edema or cough Maintain imdur, amlodipine and hydralazine dose      Relevant  Medications   hydrALAZINE (APRESOLINE) 100 MG tablet   isosorbide mononitrate (IMDUR) 30 MG 24 hr tablet   rosuvastatin (CRESTOR) 40 MG tablet   Elevated TSH    Repeat thyroid panel      Relevant Orders   Thyroid Panel With TSH (Completed)   Essential hypertension - Primary    BP not at goal Admits to taking hydralazine 100mg  only BID, miss mid day dose. Reports she is compliant with amlodipine and imdur. BP Readings from Last 3 Encounters:  10/14/22 (!) 146/74  07/22/22 124/81  06/08/22 110/82    Advised about importance of med compliance and adequate BP control Repeat CMP Refill sent      Relevant Medications   hydrALAZINE (APRESOLINE) 100 MG tablet   isosorbide mononitrate (IMDUR) 30 MG 24 hr tablet   rosuvastatin (CRESTOR) 40 MG tablet   Other Relevant Orders   Comprehensive metabolic panel (Completed)   Former tobacco use    Quit 83yrs ago      History of squamous cell carcinoma excision    Right shoulder and right Leg-excised She is concerned about another spot on Left LE-posterior. Advised to schedule another appointment with dermatology      Hyperlipidemia    Repeat lipid panel: LDL not at goal-98 Stop zocor 20mg , start crestor 40mg  Repeat lipid panel in 3-1months (fasting)      Relevant Medications   hydrALAZINE (APRESOLINE) 100 MG tablet   isosorbide mononitrate (IMDUR) 30 MG 24 hr tablet   rosuvastatin (CRESTOR) 40 MG tablet   Other Relevant Orders   Comprehensive metabolic panel (Completed)   Lipid panel (Completed)   Macrocytic anemia    Resolved with folic acid supplement. Refill sent      Relevant Medications   folic acid (FOLVITE) 1 MG tablet   Other Visit  Diagnoses     Unspecified severe protein-calorie malnutrition (HCC)   (Chronic)     Hyperglycemia       Relevant Orders   Hemoglobin A1c (Completed)   Breast cancer screening by mammogram       Relevant Orders   MM 3D SCREENING MAMMOGRAM BILATERAL BREAST   Asymptomatic postmenopausal estrogen deficiency       Relevant Orders   DG Bone Density      Return in about 4 weeks (around 11/11/2022) for HTN.     Alysia Penna, NP

## 2022-10-14 NOTE — Assessment & Plan Note (Addendum)
Right shoulder and right Leg-excised She is concerned about another spot on Left LE-posterior. Advised to schedule another appointment with dermatology

## 2022-10-14 NOTE — Assessment & Plan Note (Addendum)
Quit tobacco use 35yrs ago Current use of zocor Repeat lipid panel

## 2022-10-14 NOTE — Assessment & Plan Note (Signed)
Repeat thyroid panel

## 2022-10-14 NOTE — Assessment & Plan Note (Signed)
No SOB or CP or LE edema or cough Maintain imdur, amlodipine and hydralazine dose

## 2022-10-14 NOTE — Assessment & Plan Note (Addendum)
States she quit for 1year, but now drinks 1 mixed drink per month. Advised to avoid all alcoholic drinks due to risk of excessive use

## 2022-10-14 NOTE — Assessment & Plan Note (Signed)
Resolved with folic acid supplement. Refill sent

## 2022-10-14 NOTE — Assessment & Plan Note (Addendum)
Repeat lipid panel: LDL not at goal-98 Stop zocor 20mg , start crestor 40mg  Repeat lipid panel in 3-75months (fasting)

## 2022-10-14 NOTE — Assessment & Plan Note (Signed)
Quit 37yrs ago

## 2022-10-14 NOTE — Patient Instructions (Signed)
Go to lab Continue Heart healthy diet and daily exercise. Maintain current medications. 

## 2022-10-14 NOTE — Assessment & Plan Note (Signed)
BP not at goal Admits to taking hydralazine 100mg  only BID, miss mid day dose. Reports she is compliant with amlodipine and imdur. BP Readings from Last 3 Encounters:  10/14/22 (!) 146/74  07/22/22 124/81  06/08/22 110/82    Advised about importance of med compliance and adequate BP control Repeat CMP Refill sent

## 2022-10-15 ENCOUNTER — Other Ambulatory Visit: Payer: Self-pay | Admitting: Nurse Practitioner

## 2022-10-15 DIAGNOSIS — E782 Mixed hyperlipidemia: Secondary | ICD-10-CM

## 2022-10-15 DIAGNOSIS — I700 Atherosclerosis of aorta: Secondary | ICD-10-CM

## 2022-10-15 LAB — THYROID PANEL WITH TSH
Free Thyroxine Index: 1.8 (ref 1.4–3.8)
T3 Uptake: 27 % (ref 22–35)
T4, Total: 6.6 ug/dL (ref 5.1–11.9)
TSH: 2.7 m[IU]/L (ref 0.40–4.50)

## 2022-10-15 MED ORDER — ROSUVASTATIN CALCIUM 40 MG PO TABS
40.0000 mg | ORAL_TABLET | Freq: Every evening | ORAL | 3 refills | Status: DC
Start: 2022-10-15 — End: 2023-08-26

## 2022-10-15 NOTE — Addendum Note (Signed)
Addended by: Michaela Corner on: 10/15/2022 03:31 PM   Modules accepted: Orders

## 2022-11-06 ENCOUNTER — Ambulatory Visit (INDEPENDENT_AMBULATORY_CARE_PROVIDER_SITE_OTHER): Payer: Medicare Other

## 2022-11-06 DIAGNOSIS — Z0000 Encounter for general adult medical examination without abnormal findings: Secondary | ICD-10-CM

## 2022-11-06 NOTE — Patient Instructions (Signed)
Ms. Kolton , Thank you for taking time to come for your Medicare Wellness Visit. I appreciate your ongoing commitment to your health goals. Please review the following plan we discussed and let me know if I can assist you in the future.   Referrals/Orders/Follow-Ups/Clinician Recommendations: none  This is a list of the screening recommended for you and due dates:  Health Maintenance  Topic Date Due   Mammogram  Never done   DEXA scan (bone density measurement)  Never done   COVID-19 Vaccine (1) 11/22/2022*   Pneumonia Vaccine (1 of 1 - PCV) 10/14/2023*   Medicare Annual Wellness Visit  11/06/2023   Colon Cancer Screening  08/31/2028   Hepatitis C Screening  Completed   HPV Vaccine  Aged Out   DTaP/Tdap/Td vaccine  Discontinued   Flu Shot  Discontinued   Zoster (Shingles) Vaccine  Discontinued  *Topic was postponed. The date shown is not the original due date.    Advanced directives: (Copy Requested) Please bring a copy of your health care power of attorney and living will to the office to be added to your chart at your convenience.  Next Medicare Annual Wellness Visit scheduled for next year: Yes  Insert Preventive Care attachment Insert FALL PREVENTION attachment if needed

## 2022-11-06 NOTE — Progress Notes (Signed)
Subjective:   Stacie [name] is a 67 y.o. female who presents for Medicare Annual (Subsequent) preventive examination.  Visit Complete: Virtual  I connected with  [name] on 11/06/22 by a audio enabled telemedicine application and verified that I am speaking with the correct person using two identifiers.  Patient Location: Home  Provider Location: Office/Clinic  I discussed the limitations of evaluation and management by telemedicine. The patient expressed understanding and agreed to proceed.  Because this visit was a virtual/telehealth visit, some criteria may be missing or patient reported. Any vitals not documented were not able to be obtained and vitals that have been documented are patient reported.   Cardiac Risk Factors include: advanced age (>56men, >60 women);dyslipidemia;hypertension     Objective:    Today's Vitals   There is no height or weight on file to calculate BMI.     11/06/2022    8:20 AM 07/16/2021   12:55 PM 03/20/2021    2:57 PM 02/06/2020   12:49 PM 08/23/2019    8:26 AM 05/29/2019    2:12 PM 05/29/2019    2:11 PM  Advanced Directives  Does Patient Have a Medical Advance Directive? Yes Yes Unable to assess, patient is non-responsive or altered mental status Yes No No No  Type of Advance Directive Healthcare Power of [employer] Power of Attorney       Does patient want to make changes to medical advance directive?    Yes (MAU/Ambulatory/Procedural Areas - Information given)     Copy of Healthcare Power of Attorney in Chart? No - copy requested No - copy requested       Would patient like information on creating a medical advance directive?     No - Patient declined No - Patient declined No - Patient declined    Current Medications (verified) Outpatient Encounter Medications as of 11/06/2022  Medication Sig   amLODipine (NORVASC) 10 MG tablet Take 1 tablet (10 mg total) by mouth daily.   escitalopram (LEXAPRO) 20 MG  tablet TAKE 1 TABLET BY MOUTH DAILY.   folic acid (FOLVITE) 1 MG tablet Take 1 tablet (1 mg total) by mouth daily.   hydrALAZINE (APRESOLINE) 100 MG tablet Take 1 tablet (100 mg total) by mouth 3 (three) times daily.   isosorbide mononitrate (IMDUR) 30 MG 24 hr tablet TAKE 1 TABLET(30 MG) BY MOUTH DAILY   Multiple Vitamin (MULTIVITAMIN WITH MINERALS) TABS tablet Take 1 tablet by mouth daily.   rosuvastatin (CRESTOR) 40 MG tablet Take 1 tablet (40 mg total) by mouth every evening.   triamcinolone cream (KENALOG) 0.1 % Apply 1 Application topically 2 (two) times daily. Apply topically to the body twice a day for up to two weeks   No facility-administered encounter medications on file as of 11/06/2022.    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   Thyroid disease    Past Surgical History:  Procedure Laterality Date   FEMUR IM NAIL Right 03/20/2021   Procedure: INTRAMEDULLARY (IM) NAIL FEMORAL;  Surgeon: Teryl Lucy,  MD;  Location: [facility];  Service: Orthopedics;  Laterality: Right;   Family History  Problem Relation Age of Onset   Stroke Mother 51       embolic   Heart disease Father 22       CAd with CABG, no MI   Heart disease Sister 44       CAD   Heart disease Paternal Uncle    Heart disease Maternal Grandmother    Heart disease Paternal Grandfather    Colon cancer Neg Hx    Esophageal cancer Neg Hx    Stomach cancer Neg Hx    Social History   Socioeconomic History   Marital status: Divorced     Spouse name: Not on file   Number of children: 2   Years of education: Not on file   Highest education level: Not on file  Occupational History   Occupation: retired  Tobacco Use   Smoking status: Former    Current packs/day: 0.00    Average packs/day: 0.3 packs/day for 10.0 years (2.5 ttl pk-yrs)    Types: Cigarettes    Start date: 05/2009    Quit date: 05/2019    Years since quitting: 3.5   Smokeless tobacco: Never  Vaping Use   Vaping status: Never Used  Substance and Sexual Activity   Alcohol use: Not Currently   Drug use: Yes    Frequency: 7.0 times per week    Types: Marijuana    Comment: to help with colitis- multiple times a day   Sexual activity: Not Currently  Other Topics Concern   Not on file  Social History Narrative   Not on file   Social Determinants of Health   Financial Resource Strain: Low Risk  (11/06/2022)   Overall Financial Resource Strain (CARDIA)    Difficulty of Paying Living Expenses: Not hard at all  Food Insecurity: No Food Insecurity (11/06/2022)   Hunger Vital Sign    Worried About Running Out of Food in the Last Year: Never true    Ran Out of Food in the Last Year: Never true  Transportation Needs: No Transportation Needs (11/06/2022)   PRAPARE - Administrator, Civil Service (Medical): No    Lack of Transportation (Non-Medical): No  Physical Activity: Insufficiently Active (11/06/2022)   Exercise Vital Sign    Days of Exercise per Week: 7 days    Minutes of Exercise per Session: 10 min  Stress: Stress Concern Present (11/06/2022)   [employer] of Occupational Health - Occupational Stress Questionnaire    Feeling of Stress : To some extent  Social Connections: Socially Isolated (11/06/2022)   Social Connection and Isolation Panel [NHANES]    Frequency of Communication with Friends and Family: More than three times a week    Frequency of Social Gatherings with Friends and Family: Once a week    Attends Religious Services:  Never    Database administrator or Organizations: No    Attends Engineer, structural: Never    Marital Status: Divorced    Tobacco Counseling Counseling given: Not Answered   Clinical Intake:  Pre-visit preparation completed: Yes  Pain : No/denies pain     Nutritional Risks: Nausea/ vomitting/ diarrhea (chronic diarrhea takes medicine for it) Diabetes: No  How often do you need to have someone help you when you read instructions, pamphlets, or other written materials from your doctor or pharmacy?: 1 - Never  Interpreter Needed?: No  Information entered by ::  NAllen LPN   Activities of Daily Living    11/06/2022    8:13 AM  In your present state of health, do you have any difficulty performing the following activities:  Hearing? 1  Comment states difficulty hearing  Vision? 0  Difficulty concentrating or making decisions? 0  Walking or climbing stairs? 1  Comment broke hips  Dressing or bathing? 0  Doing errands, shopping? 1  Comment has someone with does not drive  Preparing Food and eating ? N  Using the Toilet? N  In the past six months, have you accidently leaked urine? Y  Comment with sneezing  Do you have problems with loss of bowel control? N  Managing your Medications? N  Managing your Finances? N  Housekeeping or managing your Housekeeping? Y    Patient Care Team: Nche, Bonna Gains, NP as PCP - General (Internal Medicine) Gaspar Cola, [facility] (Inactive) as Pharmacist (Pharmacist)  Indicate any recent Medical Services you may have received from other than [facility] providers in the past year (date may be approximate).     Assessment:   This is a routine wellness examination for Delonna.  Hearing/Vision screen Hearing Screening - Comments:: States has trouble hearing Vision Screening - Comments:: Regular eye exams,    Goals Addressed             This Visit's Progress    Patient Stated       11/06/2022, working on healing from  breaking both hips       Depression Screen    11/06/2022    8:21 AM 10/14/2022   12:04 PM 10/09/2021    1:37 PM 07/16/2021   12:56 PM 02/06/2020   12:52 PM 06/20/2019   11:55 AM 10/18/2017    2:02 PM  PHQ 2/9 Scores  PHQ - 2 Score 0 0 0 0 0 0 0  PHQ- 9 Score 1 0 0   3 1    Fall Risk    11/06/2022    8:21 AM 10/14/2022   12:04 PM 10/09/2021    2:27 PM 10/09/2021    1:15 PM 07/16/2021   12:56 PM  Fall Risk   Falls in the past year? 0 0 1 1 1   Number falls in past yr: 0 0 1 1 0  Injury with Fall? 0 0 1 1 1   Risk for fall due to : Medication side effect;Impaired mobility;Impaired balance/gait No Fall Risks History of fall(s);Impaired balance/gait  Impaired mobility  Risk for fall due to: Comment     uses cane and walker  Follow up Falls prevention discussed;Falls evaluation completed Falls evaluation completed Falls evaluation completed;Falls prevention discussed  Falls evaluation completed;Education provided    MEDICARE RISK AT HOME: Medicare Risk at Home Any stairs in or around the home?: Yes If so, are there any without handrails?: No Home free of loose throw rugs in walkways, pet beds, electrical cords, etc?: Yes Adequate lighting in your home to reduce risk of falls?: Yes Life alert?: No Use of a cane, walker or w/c?: No Grab bars in the bathroom?: Yes Shower chair or bench in shower?: Yes Elevated toilet seat or a handicapped toilet?: Yes  TIMED UP AND GO:  Was the test performed?  No    Cognitive Function:    03/13/2020   12:19 PM  MMSE - Mini Mental State Exam  Orientation to time 5  Orientation to Place 5  Registration 2  Attention/ Calculation 5  Recall 2  Language- name 2  objects 2  Language- repeat 1  Language- follow 3 step command 3  Language- read & follow direction 1  Write a sentence 1  Copy design 1  Total score 28        11/06/2022    8:22 AM 02/06/2020   12:57 PM  6CIT Screen  What Year? 0 points 0 points  What month? 0 points 0 points  What  time? 0 points 0 points  Count back from 20 0 points 0 points  Months in reverse 4 points 4 points  Repeat phrase 0 points 0 points  Total Score 4 points 4 points    Immunizations  There is no immunization history on file for this patient.  TDAP status: Up to date  Flu Vaccine status: Declined, Education has been provided regarding the importance of this vaccine but patient still declined. Advised may receive this vaccine at local pharmacy or Health Dept. Aware to provide a copy of the vaccination record if obtained from local pharmacy or Health Dept. Verbalized acceptance and understanding.  Pneumococcal vaccine status: Declined,  Education has been provided regarding the importance of this vaccine but patient still declined. Advised may receive this vaccine at local pharmacy or Health Dept. Aware to provide a copy of the vaccination record if obtained from local pharmacy or Health Dept. Verbalized acceptance and understanding.   Covid-19 vaccine status: Declined, Education has been provided regarding the importance of this vaccine but patient still declined. Advised may receive this vaccine at local pharmacy or Health Dept.or vaccine clinic. Aware to provide a copy of the vaccination record if obtained from local pharmacy or Health Dept. Verbalized acceptance and understanding.  Qualifies for Shingles Vaccine? Yes   Zostavax completed No   Shingrix Completed?: No.    Education has been provided regarding the importance of this vaccine. Patient has been advised to call insurance company to determine out of pocket expense if they have not yet received this vaccine. Advised may also receive vaccine at local pharmacy or Health Dept. Verbalized acceptance and understanding.  Screening Tests Health Maintenance  Topic Date Due   MAMMOGRAM  Never done   DEXA SCAN  Never done   COVID-19 Vaccine (1) 11/22/2022 (Originally 06/09/1960)   Pneumonia Vaccine 75+ Years old (1 of 1 - PCV) 10/14/2023  (Originally 06/09/2020)   Medicare Annual Wellness (AWV)  11/06/2023   Colonoscopy  08/31/2028   Hepatitis C Screening  Completed   HPV VACCINES  Aged Out   DTaP/Tdap/Td  Discontinued   INFLUENZA VACCINE  Discontinued   Zoster Vaccines- Shingrix  Discontinued    Health Maintenance  Health Maintenance Due  Topic Date Due   MAMMOGRAM  Never done   DEXA SCAN  Never done    Colorectal cancer screening: Type of screening: Colonoscopy. Completed 7/30/*2020. Repeat every 10 years  Mammogram status: Ordered 10/14/2022. Pt provided with contact info and advised to call to schedule appt.   Bone Density status: Ordered 10/14/2022. Pt provided with contact info and advised to call to schedule appt.  Lung Cancer Screening: (Low Dose CT Chest recommended if Age 60-80 years, 20 pack-year currently smoking OR have quit w/in 15years.) does not qualify.   Lung Cancer Screening Referral: no  Additional Screening:  Hepatitis C Screening: does qualify; Completed 05/21/2016  Vision Screening: Recommended annual ophthalmology exams for early detection of glaucoma and other disorders of the eye. Is the patient up to date with their annual eye exam?  No  Who is the provider or  what is the name of the office in which the patient attends annual eye exams? Can't remember name If pt is not established with a provider, would they like to be referred to a provider to establish care? No .   Dental Screening: Recommended annual dental exams for proper oral hygiene  Diabetic Foot Exam: n/a  Community Resource Referral / Chronic Care Management: CRR required this visit?  No   CCM required this visit?  No     Plan:     I have personally reviewed and noted the following in the patient's chart:   Medical and social history Use of alcohol, tobacco or illicit drugs  Current medications and supplements including opioid prescriptions. Patient is not currently taking opioid prescriptions. Functional ability  and status Nutritional status Physical activity Advanced directives List of other physicians Hospitalizations, surgeries, and ER visits in previous 12 months Vitals Screenings to include cognitive, depression, and falls Referrals and appointments  In addition, I have reviewed and discussed with patient certain preventive protocols, quality metrics, and best practice recommendations. A written personalized care plan for preventive services as well as general preventive health recommendations were provided to patient.     Barb Merino, LPN   10/10/1189   After Visit Summary: (MyChart) Due to this being a telephonic visit, the after visit summary with patients personalized plan was offered to patient via MyChart   Nurse Notes: none

## 2022-11-12 ENCOUNTER — Ambulatory Visit: Payer: Medicare Other | Admitting: Nurse Practitioner

## 2022-11-14 ENCOUNTER — Other Ambulatory Visit: Payer: Self-pay | Admitting: Nurse Practitioner

## 2022-11-14 DIAGNOSIS — I10 Essential (primary) hypertension: Secondary | ICD-10-CM

## 2022-11-19 ENCOUNTER — Ambulatory Visit: Payer: Medicare Other | Admitting: Nurse Practitioner

## 2022-11-23 ENCOUNTER — Other Ambulatory Visit: Payer: Self-pay | Admitting: Nurse Practitioner

## 2022-11-23 DIAGNOSIS — I10 Essential (primary) hypertension: Secondary | ICD-10-CM

## 2022-11-24 ENCOUNTER — Ambulatory Visit: Payer: Medicare Other | Admitting: Dermatology

## 2022-12-08 ENCOUNTER — Ambulatory Visit
Admission: RE | Admit: 2022-12-08 | Discharge: 2022-12-08 | Disposition: A | Discharge status: Home / Self Care | Payer: Medicare Other | Source: Ambulatory Visit | Attending: Nurse Practitioner | Admitting: Nurse Practitioner

## 2022-12-08 DIAGNOSIS — Z1231 Encounter for screening mammogram for malignant neoplasm of breast: Secondary | ICD-10-CM | POA: Diagnosis not present

## 2022-12-12 ENCOUNTER — Other Ambulatory Visit: Payer: Self-pay | Admitting: Nurse Practitioner

## 2022-12-12 DIAGNOSIS — D539 Nutritional anemia, unspecified: Secondary | ICD-10-CM

## 2022-12-18 ENCOUNTER — Other Ambulatory Visit: Payer: Self-pay | Admitting: Nurse Practitioner

## 2022-12-18 DIAGNOSIS — I10 Essential (primary) hypertension: Secondary | ICD-10-CM

## 2022-12-18 DIAGNOSIS — F411 Generalized anxiety disorder: Secondary | ICD-10-CM

## 2022-12-18 DIAGNOSIS — F3342 Major depressive disorder, recurrent, in full remission: Secondary | ICD-10-CM

## 2022-12-21 ENCOUNTER — Telehealth: Payer: Self-pay | Admitting: Nurse Practitioner

## 2022-12-21 ENCOUNTER — Other Ambulatory Visit: Payer: Self-pay

## 2022-12-21 DIAGNOSIS — I10 Essential (primary) hypertension: Secondary | ICD-10-CM

## 2022-12-21 MED ORDER — AMLODIPINE BESYLATE 10 MG PO TABS: 10.0000 mg | ORAL_TABLET | Freq: Every day | ORAL | 3 refills | Status: DC

## 2022-12-21 NOTE — Telephone Encounter (Signed)
Stacie [name] [phone] called she said it is about the patient amLODipine (NORVASC) 10 MG tablet [[id number]]

## 2022-12-21 NOTE — Telephone Encounter (Signed)
Returned patient phone call. PT stated she have been out of Amlodipine 10 mg for a couple of weeks. I did advise patient she needed to follow up with Claris Gower NP from last office visit within 1 month regarding HTN. PT have a hard time with transportation and dealing with other appointments. PT was scheduled for follow up in December; and only a 30 day supply was sent to pharmacy follow appointment is needed. Patient verbalized understanding

## 2022-12-22 NOTE — Telephone Encounter (Signed)
Requesting: ESCITALOPRAM 20MG  TABLETS  Last Visit: 10/14/2022 Next Visit: 01/06/2023 Last Refill: 06/08/2022  Please Advise

## 2023-01-06 ENCOUNTER — Ambulatory Visit: Payer: Medicare Other | Admitting: Nurse Practitioner

## 2023-01-06 ENCOUNTER — Encounter: Payer: Self-pay | Admitting: Nurse Practitioner

## 2023-01-06 VITALS — BP 138/72 | HR 91 | Temp 98.3°F | Resp 18 | Ht 65.0 in | Wt 95.8 lb

## 2023-01-06 DIAGNOSIS — I10 Essential (primary) hypertension: Secondary | ICD-10-CM | POA: Diagnosis not present

## 2023-01-06 DIAGNOSIS — F411 Generalized anxiety disorder: Secondary | ICD-10-CM

## 2023-01-06 DIAGNOSIS — F1290 Cannabis use, unspecified, uncomplicated: Secondary | ICD-10-CM | POA: Diagnosis not present

## 2023-01-06 DIAGNOSIS — R636 Underweight: Secondary | ICD-10-CM

## 2023-01-06 DIAGNOSIS — F3342 Major depressive disorder, recurrent, in full remission: Secondary | ICD-10-CM

## 2023-01-06 MED ORDER — AMLODIPINE BESYLATE 10 MG PO TABS: 10.0000 mg | ORAL_TABLET | Freq: Every day | ORAL | 3 refills | Status: DC

## 2023-01-06 MED ORDER — ESCITALOPRAM OXALATE 20 MG PO TABS: ORAL_TABLET | ORAL | 3 refills | Status: DC

## 2023-01-06 NOTE — Assessment & Plan Note (Signed)
Chronic Denies any GI symptoms She smokes marijuana BID in order to eat a day. Denies any tobacco and ALCOHOL use. Encouraged to eat a day and drink 2botttles of ensure/boost daily

## 2023-01-06 NOTE — Patient Instructions (Signed)
Continue Heart healthy diet and daily exercise. Maintain current medications. Schedule bone density appointment: [phone].

## 2023-01-06 NOTE — Progress Notes (Signed)
Established Patient Visit  Patient: Stacie [name]   DOB: 01/17/1956   67 y.o. Female  MRN: [id number] Visit Date: 01/06/2023  Subjective:    Chief Complaint  Patient presents with   OFFICE VISIT     PT is here for 1 month follow up   Hypertension   Hypertension   Accompanied by friend.  Marijuana use Smokes 1pipe BID  Underweight Chronic Denies any GI symptoms She smokes marijuana BID in order to eat a day. Denies any tobacco and ALCOHOL use. Encouraged to eat a day and drink 2botttles of ensure/boost daily  Depression Stable with lexapro 20mg  every day. Refill sent  Essential hypertension BP at goal with amlodipine, hydralazine and imdur BP Readings from Last 3 Encounters:  01/06/23 138/72  10/14/22 (!) 146/74  07/22/22 124/81    Refill sent Maintain med doses  BP Readings from Last 3 Encounters:  01/06/23 138/72  10/14/22 (!) 146/74  07/22/22 124/81    Wt Readings from Last 3 Encounters:  01/06/23 95 lb 12.8 oz (43.5 kg)  10/14/22 100 lb 3.2 oz (45.5 kg)  06/08/22 98 lb 6.4 oz (44.6 kg)    Reviewed medical, surgical, and social history today  Medications: Outpatient Medications Prior to Visit  Medication Sig   folic acid (FOLVITE) 1 MG tablet Take 1 tablet (1 mg total) by mouth daily.   hydrALAZINE (APRESOLINE) 100 MG tablet Take 1 tablet (100 mg total) by mouth 3 (three) times daily.   isosorbide mononitrate (IMDUR) 30 MG 24 hr tablet TAKE 1 TABLET(30 MG) BY MOUTH DAILY   levocetirizine (XYZAL) 5 MG tablet SMARTSIG:1 Tablet(s) By Mouth Every Evening   rosuvastatin (CRESTOR) 40 MG tablet Take 1 tablet (40 mg total) by mouth every evening.   triamcinolone cream (KENALOG) 0.1 % Apply 1 Application topically 2 (two) times daily. Apply topically to the body twice a day for up to two weeks   [DISCONTINUED] amLODipine (NORVASC) 10 MG tablet Take 1 tablet (10 mg total) by mouth daily.   [DISCONTINUED] escitalopram  (LEXAPRO) 20 MG tablet TAKE 1 TABLET BY MOUTH DAILY.   Multiple Vitamin (MULTIVITAMIN WITH MINERALS) TABS tablet Take 1 tablet by mouth daily. (Patient not taking: Reported on 01/06/2023)   No facility-administered medications prior to visit.   Reviewed past medical and social history.   ROS per HPI above  Last CBC Lab Results  Component Value Date   WBC 6.8 04/16/2021   HGB 12.1 04/16/2021   HCT 37.9 04/16/2021   MCV 86.2 04/16/2021   MCH 27.4 03/28/2021   RDW 16.8 (H) 04/16/2021   PLT 301.0 04/16/2021   Last metabolic panel Lab Results  Component Value Date   GLUCOSE 104 (H) 10/14/2022   NA 140 10/14/2022   K 4.1 10/14/2022   CL 104 10/14/2022   CO2 28 10/14/2022   BUN 14 10/14/2022   CREATININE 0.80 10/14/2022   GFR 76.28 10/14/2022   CALCIUM 9.3 10/14/2022   PHOS 3.0 03/19/2021   PROT 6.7 10/14/2022   ALBUMIN 4.2 10/14/2022   BILITOT 0.3 10/14/2022   ALKPHOS 81 10/14/2022   AST 14 10/14/2022   ALT 9 10/14/2022   ANIONGAP 6 03/28/2021   Last lipids Lab Results  Component Value Date   CHOL 177 10/14/2022   HDL 49.30 10/14/2022   LDLCALC 98 10/14/2022   LDLDIRECT 110.0 10/09/2021   TRIG 148.0 10/14/2022   CHOLHDL 4 10/14/2022  Last hemoglobin A1c Lab Results  Component Value Date   HGBA1C 5.6 10/14/2022   Last thyroid functions Lab Results  Component Value Date   TSH 2.70 10/14/2022   T4TOTAL 6.6 10/14/2022        Objective:  BP 138/72 (BP Location: Left Arm, Patient Position: Sitting, Cuff Size: Small) Comment: done manual  Pulse 91   Temp 98.3 F (36.8 C) (Temporal)   Resp 18   Ht 5\' 5"  (1.651 m)   Wt 95 lb 12.8 oz (43.5 kg)   SpO2 97%   BMI 15.94 kg/m      Physical Exam Vitals and nursing note reviewed.  Cardiovascular:     Rate and Rhythm: Normal rate and regular rhythm.     Pulses: Normal pulses.     Heart sounds: Normal heart sounds.  Pulmonary:     Effort: Pulmonary effort is normal.     Breath sounds: Normal breath sounds.   Musculoskeletal:     Right lower leg: No edema.     Left lower leg: No edema.  Neurological:     Mental Status: She is alert and oriented to person, place, and time.     Gait: Gait abnormal.  Psychiatric:        Mood and Affect: Mood normal.        Behavior: Behavior normal.     No results found for any visits on 01/06/23.    Assessment & Plan:    Problem List Items Addressed This Visit     Depression (Chronic)    Stable with lexapro 20mg  every day. Refill sent      Relevant Medications   escitalopram (LEXAPRO) 20 MG tablet   Essential hypertension - Primary    BP at goal with amlodipine, hydralazine and imdur BP Readings from Last 3 Encounters:  01/06/23 138/72  10/14/22 (!) 146/74  07/22/22 124/81    Refill sent Maintain med doses      Relevant Medications   amLODipine (NORVASC) 10 MG tablet   Marijuana use (Chronic)    Smokes 1pipe BID      Underweight (Chronic)    Chronic Denies any GI symptoms She smokes marijuana BID in order to eat a day. Denies any tobacco and ALCOHOL use. Encouraged to eat a day and drink 2botttles of ensure/boost daily      Other Visit Diagnoses     GAD (generalized anxiety disorder)       Relevant Medications   escitalopram (LEXAPRO) 20 MG tablet      Return in about 6 months (around 07/07/2023) for Hypothyroidism, HTN, hyperlipidemia (fasting).     Alysia Penna, NP

## 2023-01-06 NOTE — Assessment & Plan Note (Signed)
BP at goal with amlodipine, hydralazine and imdur BP Readings from Last 3 Encounters:  01/06/23 138/72  10/14/22 (!) 146/74  07/22/22 124/81    Refill sent Maintain med doses

## 2023-01-06 NOTE — Assessment & Plan Note (Signed)
Stable with lexapro 20mg  every day. Refill sent

## 2023-01-06 NOTE — Assessment & Plan Note (Signed)
Smokes 1pipe BID

## 2023-06-08 ENCOUNTER — Other Ambulatory Visit: Payer: Self-pay | Admitting: Nurse Practitioner

## 2023-06-08 DIAGNOSIS — F3342 Major depressive disorder, recurrent, in full remission: Secondary | ICD-10-CM

## 2023-06-08 DIAGNOSIS — F411 Generalized anxiety disorder: Secondary | ICD-10-CM

## 2023-06-11 NOTE — Telephone Encounter (Signed)
 Called patient's pharmacy and I informed the person who only identified himself as pharmacist the reason for my call. He stated that they have the refill already on file and that I can ignore the request. I thanked him for taking my call.

## 2023-06-11 NOTE — Telephone Encounter (Signed)
 Called and left a detailed message per DPR on file for patient informing her that her pharmacy has her Rx for Lexapro  20 mg and that she has future refills with the pharmacy. Asked to give the office a call if she has any questions

## 2023-07-07 ENCOUNTER — Ambulatory Visit: Payer: Medicare Other | Admitting: Nurse Practitioner

## 2023-07-12 ENCOUNTER — Encounter: Payer: Self-pay | Admitting: Nurse Practitioner

## 2023-08-26 ENCOUNTER — Other Ambulatory Visit: Payer: Self-pay | Admitting: Nurse Practitioner

## 2023-08-26 DIAGNOSIS — I700 Atherosclerosis of aorta: Secondary | ICD-10-CM

## 2023-08-26 DIAGNOSIS — E782 Mixed hyperlipidemia: Secondary | ICD-10-CM

## 2023-08-26 NOTE — Telephone Encounter (Signed)
 Medication: Rosuvastatin  (Crestor ) 40 mg  Directions: Take 1 tablet by mouth daily  Last given: 10/15/22 Number refills: 3 Last o/v: 01/06/23 Follow up: 6 month follow up-No Show 07/07/23 Labs: 10/14/22

## 2023-08-26 NOTE — Telephone Encounter (Signed)
 Called and left a detailed message for patient per DPR on file about receiving a refill request for Rosuvastatin  40 mg and that Roselie wanted her to follow up in 6 months when last seen in the office back in December. Stated that I sent a 30 day supply to her pharmacy and to give the office a call at [phone] to get scheduled for a follow up appointment or if she has any questions.

## 2023-09-29 ENCOUNTER — Ambulatory Visit: Payer: Self-pay | Admitting: Nurse Practitioner

## 2023-09-29 ENCOUNTER — Other Ambulatory Visit (HOSPITAL_COMMUNITY): Payer: Self-pay

## 2023-09-29 ENCOUNTER — Encounter: Payer: Self-pay | Admitting: Nurse Practitioner

## 2023-09-29 ENCOUNTER — Other Ambulatory Visit: Payer: Self-pay

## 2023-09-29 ENCOUNTER — Ambulatory Visit (INDEPENDENT_AMBULATORY_CARE_PROVIDER_SITE_OTHER)
Admission: RE | Admit: 2023-09-29 | Discharge: 2023-09-29 | Disposition: A | Discharge status: Home / Self Care | Source: Ambulatory Visit | Attending: Nurse Practitioner | Admitting: Nurse Practitioner

## 2023-09-29 ENCOUNTER — Ambulatory Visit (INDEPENDENT_AMBULATORY_CARE_PROVIDER_SITE_OTHER): Admitting: Nurse Practitioner

## 2023-09-29 VITALS — BP 134/72 | HR 74 | Temp 97.8°F | Ht 64.0 in | Wt 95.0 lb

## 2023-09-29 DIAGNOSIS — E559 Vitamin D deficiency, unspecified: Secondary | ICD-10-CM

## 2023-09-29 DIAGNOSIS — E782 Mixed hyperlipidemia: Secondary | ICD-10-CM | POA: Diagnosis not present

## 2023-09-29 DIAGNOSIS — F411 Generalized anxiety disorder: Secondary | ICD-10-CM | POA: Diagnosis not present

## 2023-09-29 DIAGNOSIS — F3342 Major depressive disorder, recurrent, in full remission: Secondary | ICD-10-CM | POA: Diagnosis not present

## 2023-09-29 DIAGNOSIS — M25552 Pain in left hip: Secondary | ICD-10-CM

## 2023-09-29 DIAGNOSIS — M1612 Unilateral primary osteoarthritis, left hip: Secondary | ICD-10-CM

## 2023-09-29 DIAGNOSIS — G8929 Other chronic pain: Secondary | ICD-10-CM

## 2023-09-29 DIAGNOSIS — D539 Nutritional anemia, unspecified: Secondary | ICD-10-CM | POA: Diagnosis not present

## 2023-09-29 DIAGNOSIS — I700 Atherosclerosis of aorta: Secondary | ICD-10-CM | POA: Diagnosis not present

## 2023-09-29 DIAGNOSIS — I10 Essential (primary) hypertension: Secondary | ICD-10-CM

## 2023-09-29 DIAGNOSIS — L989 Disorder of the skin and subcutaneous tissue, unspecified: Secondary | ICD-10-CM | POA: Insufficient documentation

## 2023-09-29 DIAGNOSIS — Z780 Asymptomatic menopausal state: Secondary | ICD-10-CM | POA: Diagnosis not present

## 2023-09-29 DIAGNOSIS — I5032 Chronic diastolic (congestive) heart failure: Secondary | ICD-10-CM | POA: Diagnosis not present

## 2023-09-29 LAB — COMPREHENSIVE METABOLIC PANEL WITH GFR
ALT: 13 U/L (ref 0–35)
AST: 19 U/L (ref 0–37)
Albumin: 4.6 g/dL (ref 3.5–5.2)
Alkaline Phosphatase: 66 U/L (ref 39–117)
BUN: 11 mg/dL (ref 6–23)
CO2: 26 meq/L (ref 19–32)
Calcium: 9.1 mg/dL (ref 8.4–10.5)
Chloride: 102 meq/L (ref 96–112)
Creatinine, Ser: 0.8 mg/dL (ref 0.40–1.20)
GFR: 75.77 mL/min (ref >60.00)
Glucose, Bld: 88 mg/dL (ref 70–99)
Potassium: 3.6 meq/L (ref 3.5–5.1)
Sodium: 140 meq/L (ref 135–145)
Total Bilirubin: 0.4 mg/dL (ref 0.2–1.2)
Total Protein: 7.5 g/dL (ref 6.0–8.3)

## 2023-09-29 LAB — LIPID PANEL
Cholesterol: 143 mg/dL (ref 0–200)
HDL: 50.6 mg/dL (ref >39.00)
LDL Cholesterol: 71 mg/dL (ref 0–99)
NonHDL: 92.63
Total CHOL/HDL Ratio: 3
Triglycerides: 106 mg/dL (ref 0.0–149.0)
VLDL: 21.2 mg/dL (ref 0.0–40.0)

## 2023-09-29 LAB — CBC
HCT: 37.1 % (ref 36.0–46.0)
Hemoglobin: 12.3 g/dL (ref 12.0–15.0)
MCHC: 33.3 g/dL (ref 30.0–36.0)
MCV: 83.7 fl (ref 78.0–100.0)
Platelets: 334 K/uL (ref 150.0–400.0)
RBC: 4.43 Mil/uL (ref 3.87–5.11)
RDW: 13.4 % (ref 11.5–15.5)
WBC: 8.4 K/uL (ref 4.0–10.5)

## 2023-09-29 LAB — VITAMIN D 25 HYDROXY (VIT D DEFICIENCY, FRACTURES): VITD: 21.66 ng/mL — ABNORMAL LOW (ref 30.00–100.00)

## 2023-09-29 MED ORDER — ESCITALOPRAM OXALATE 20 MG PO TABS
20.0000 mg | ORAL_TABLET | Freq: Every day | ORAL | 3 refills | Status: AC
  Filled 2023-09-29 – 2024-01-17 (×3): qty 30, 30d supply, fill #0
  Filled 2024-02-17: qty 30, 30d supply, fill #1

## 2023-09-29 MED ORDER — FOLIC ACID 1 MG PO TABS
1.0000 mg | ORAL_TABLET | Freq: Every day | ORAL | 3 refills | Status: AC
  Filled 2023-09-29 – 2023-10-25 (×3): qty 30, 30d supply, fill #0
  Filled 2023-11-17: qty 30, 30d supply, fill #1
  Filled 2023-12-17: qty 30, 30d supply, fill #2
  Filled 2024-01-17: qty 30, 30d supply, fill #3
  Filled 2024-02-17: qty 30, 30d supply, fill #4

## 2023-09-29 MED ORDER — ISOSORBIDE MONONITRATE ER 30 MG PO TB24
30.0000 mg | ORAL_TABLET | Freq: Every day | ORAL | 3 refills | Status: DC
  Filled 2023-09-29 (×2): qty 30, 30d supply, fill #0

## 2023-09-29 MED ORDER — AMLODIPINE BESYLATE 10 MG PO TABS
10.0000 mg | ORAL_TABLET | Freq: Every day | ORAL | 3 refills | Status: AC
  Filled 2023-09-29 – 2023-10-25 (×3): qty 30, 30d supply, fill #0
  Filled 2023-11-17: qty 30, 30d supply, fill #1
  Filled 2023-12-17: qty 30, 30d supply, fill #2
  Filled 2024-01-17: qty 30, 30d supply, fill #3
  Filled 2024-02-17: qty 30, 30d supply, fill #4

## 2023-09-29 MED ORDER — HYDRALAZINE HCL 100 MG PO TABS
100.0000 mg | ORAL_TABLET | Freq: Three times a day (TID) | ORAL | 3 refills | Status: DC
  Filled 2023-09-29: qty 90, 30d supply, fill #0

## 2023-09-29 NOTE — Assessment & Plan Note (Signed)
 Repeat CBC.

## 2023-09-29 NOTE — Assessment & Plan Note (Addendum)
 No current tobacco use Current use of crestor  Repeat lipid panel and CMP

## 2023-09-29 NOTE — Progress Notes (Signed)
 Established Patient Visit  Patient: Stacie [name]   DOB: Jun 22, 1955   68 y.o. Female  MRN: [id number] Visit Date: 09/29/2023  Subjective:    Chief Complaint  Patient presents with   Follow-up    FASTING 6 month follow up for HTN, Hypothyroidism, Hyperlipidemia  Refills needed for Amlodipine , Lexapro , Rosuvastatin , Imdur , Hydralazine     HPI Accompanied by daughter  Essential hypertension BP at goal with amlodipine , hydralazine  and imdur  BP Readings from Last 3 Encounters:  09/29/23 134/72  01/06/23 138/72  10/14/22 (!) 146/74    Refill sent Maintain med doses  Chronic diastolic CHF (congestive heart failure) (HCC) No SOB or CP or LE edema or cough Maintain imdur , amlodipine  and hydralazine  dose BP Readings from Last 3 Encounters:  09/29/23 134/72  01/06/23 138/72  10/14/22 (!) 146/74    Wt Readings from Last 3 Encounters:  09/29/23 95 lb (43.1 kg)  01/06/23 95 lb 12.8 oz (43.5 kg)  10/14/22 100 lb 3.2 oz (45.5 kg)     Aortic atherosclerosis (HCC) No current tobacco use Current use of crestor  Repeat lipid panel and CMP  Vitamin D  deficiency Repeat vit. D Advised to schedule appointment for bone density scan  Macrocytic anemia Repeat CBC  Hyperlipidemia Repeat lipid panel Current use of crestor   Depression Stable mood with lexapro  20mg . Persistent Compulsive need to pick at scarps which leads to small open wounds. Advised to consider engaging in hobbies which serve as a distraction e.g puzzle, arts/crafts.  Chronic left hip pain Onset after fall in 2023, gradually worsening, associated with limited ROM, leg weakness, and unsteady gait. She denies any fall in last 2years. No paresthesia. Use of ibuprofen 600mg  BID with some improvement. CT femur 2023: Postsurgical changes of recent ORIF of the proximal right femur.Prominent edema and ill-defined fluid within the medial subcutaneous tissues of the upper right thigh, concerning for  soft tissue infection/cellulitis. No organized or rim-enhancing collection.Healing fracture involving the left greater  trochanter with mild displacement.  Limited left hip external rotation and tenderness over lateral hip joint. Get hip x-ray  Scalp lesion 3 scaly lesions on parietal region on scalp, no erythema or tenderness. Hx of squamous cell carcinoma of R. LE  Entered referral to dermatology   Reviewed medical, surgical, and social history today  Medications: Outpatient Medications Prior to Visit  Medication Sig   Multiple Vitamin (MULTIVITAMIN WITH MINERALS) TABS tablet Take 1 tablet by mouth daily. (Patient taking differently: Take 1 tablet by mouth every other day.)   rosuvastatin  (CRESTOR ) 40 MG tablet TAKE 1 TABLET(40 MG) BY MOUTH EVERY EVENING   [DISCONTINUED] amLODipine  (NORVASC ) 10 MG tablet Take 1 tablet (10 mg total) by mouth daily.   [DISCONTINUED] escitalopram  (LEXAPRO ) 20 MG tablet TAKE 1 TABLET BY MOUTH DAILY.   [DISCONTINUED] folic acid  (FOLVITE ) 1 MG tablet Take 1 tablet (1 mg total) by mouth daily.   [DISCONTINUED] hydrALAZINE  (APRESOLINE ) 100 MG tablet Take 1 tablet (100 mg total) by mouth 3 (three) times daily.   [DISCONTINUED] isosorbide  mononitrate (IMDUR ) 30 MG 24 hr tablet TAKE 1 TABLET(30 MG) BY MOUTH DAILY   [DISCONTINUED] levocetirizine (XYZAL) 5 MG tablet SMARTSIG:1 Tablet(s) By Mouth Every Evening (Patient not taking: Reported on 09/29/2023)   [DISCONTINUED] triamcinolone  cream (KENALOG ) 0.1 % Apply 1 Application topically 2 (two) times daily. Apply topically to the body twice a day for up to two weeks (Patient not taking: Reported on 09/29/2023)  No facility-administered medications prior to visit.   Reviewed past medical and social history.   ROS per HPI above      Objective:  BP 134/72 (BP Location: Left Arm, Patient Position: Sitting, Cuff Size: Normal)   Pulse 74   Temp 97.8 F (36.6 C) (Oral)   Ht 5' 4 (1.626 m)   Wt 95 lb (43.1 kg)    SpO2 95%   BMI 16.31 kg/m      Physical Exam Vitals and nursing note reviewed.  Cardiovascular:     Rate and Rhythm: Normal rate and regular rhythm.     Pulses: Normal pulses.     Heart sounds: Normal heart sounds.  Pulmonary:     Effort: Pulmonary effort is normal.     Breath sounds: Normal breath sounds.  Musculoskeletal:     Right hip: Normal.     Left hip: Tenderness present. No crepitus. Decreased range of motion. Decreased strength.     Right upper leg: Normal.     Left upper leg: Normal.     Right knee: Normal.     Left knee: Normal.     Right lower leg: No edema.     Left lower leg: No edema.  Skin:    General: Skin is warm and dry.     Findings: Rash present. No erythema. Rash is scaling.  Neurological:     Mental Status: She is alert and oriented to person, place, and time.     No results found for any visits on 09/29/23.    Assessment & Plan:    Problem List Items Addressed This Visit     Aortic atherosclerosis (HCC)   No current tobacco use Current use of crestor  Repeat lipid panel and CMP      Relevant Medications   amLODipine  (NORVASC ) 10 MG tablet   hydrALAZINE  (APRESOLINE ) 100 MG tablet   isosorbide  mononitrate (IMDUR ) 30 MG 24 hr tablet   Other Relevant Orders   Lipid panel   Chronic diastolic CHF (congestive heart failure) (HCC) (Chronic)   No SOB or CP or LE edema or cough Maintain imdur , amlodipine  and hydralazine  dose BP Readings from Last 3 Encounters:  09/29/23 134/72  01/06/23 138/72  10/14/22 (!) 146/74    Wt Readings from Last 3 Encounters:  09/29/23 95 lb (43.1 kg)  01/06/23 95 lb 12.8 oz (43.5 kg)  10/14/22 100 lb 3.2 oz (45.5 kg)         Relevant Medications   amLODipine  (NORVASC ) 10 MG tablet   hydrALAZINE  (APRESOLINE ) 100 MG tablet   isosorbide  mononitrate (IMDUR ) 30 MG 24 hr tablet   Chronic left hip pain   Onset after fall in 2023, gradually worsening, associated with limited ROM, leg weakness, and unsteady gait.  She denies any fall in last 2years. No paresthesia. Use of ibuprofen 600mg  BID with some improvement. CT femur 2023: Postsurgical changes of recent ORIF of the proximal right femur.Prominent edema and ill-defined fluid within the medial subcutaneous tissues of the upper right thigh, concerning for soft tissue infection/cellulitis. No organized or rim-enhancing collection.Healing fracture involving the left greater  trochanter with mild displacement.  Limited left hip external rotation and tenderness over lateral hip joint. Get hip x-ray      Relevant Medications   escitalopram  (LEXAPRO ) 20 MG tablet   Other Relevant Orders   DG HIP UNILAT W OR W/O PELVIS 2-3 VIEWS LEFT   Depression (Chronic)   Stable mood with lexapro  20mg . Persistent Compulsive need to pick at  scarps which leads to small open wounds. Advised to consider engaging in hobbies which serve as a distraction e.g puzzle, arts/crafts.      Relevant Medications   escitalopram  (LEXAPRO ) 20 MG tablet   Essential hypertension   BP at goal with amlodipine , hydralazine  and imdur  BP Readings from Last 3 Encounters:  09/29/23 134/72  01/06/23 138/72  10/14/22 (!) 146/74    Refill sent Maintain med doses      Relevant Medications   amLODipine  (NORVASC ) 10 MG tablet   hydrALAZINE  (APRESOLINE ) 100 MG tablet   isosorbide  mononitrate (IMDUR ) 30 MG 24 hr tablet   Other Relevant Orders   Comprehensive metabolic panel with GFR   Hyperlipidemia - Primary   Repeat lipid panel Current use of crestor       Relevant Medications   amLODipine  (NORVASC ) 10 MG tablet   hydrALAZINE  (APRESOLINE ) 100 MG tablet   isosorbide  mononitrate (IMDUR ) 30 MG 24 hr tablet   Other Relevant Orders   Lipid panel   Comprehensive metabolic panel with GFR   Macrocytic anemia   Repeat CBC      Relevant Medications   folic acid  (FOLVITE ) 1 MG tablet   Other Relevant Orders   CBC   Scalp lesion   3 scaly lesions on parietal region on scalp, no  erythema or tenderness. Hx of squamous cell carcinoma of R. LE  Entered referral to dermatology      Relevant Orders   Ambulatory referral to Dermatology   Vitamin D  deficiency   Repeat vit. D Advised to schedule appointment for bone density scan      Relevant Orders   VITAMIN D  25 Hydroxy (Vit-D Deficiency, Fractures)   Other Visit Diagnoses       GAD (generalized anxiety disorder)       Relevant Medications   escitalopram  (LEXAPRO ) 20 MG tablet     Asymptomatic postmenopausal estrogen deficiency       Relevant Orders   DG Bone Density      Return in about 6 months (around 03/31/2024) for HTN, hyperlipidemia (fasting).     Roselie Mood, NP

## 2023-09-29 NOTE — Assessment & Plan Note (Signed)
 No SOB or CP or LE edema or cough Maintain imdur , amlodipine  and hydralazine  dose BP Readings from Last 3 Encounters:  09/29/23 134/72  01/06/23 138/72  10/14/22 (!) 146/74    Wt Readings from Last 3 Encounters:  09/29/23 95 lb (43.1 kg)  01/06/23 95 lb 12.8 oz (43.5 kg)  10/14/22 100 lb 3.2 oz (45.5 kg)

## 2023-09-29 NOTE — Patient Instructions (Signed)
 Go to lab Go to [address] for hip x-ray Maintain Heart healthy diet and daily exercise. Maintain current medications.

## 2023-09-29 NOTE — Assessment & Plan Note (Signed)
 Onset after fall in 2023, gradually worsening, associated with limited ROM, leg weakness, and unsteady gait. She denies any fall in last 2years. No paresthesia. Use of ibuprofen 600mg  BID with some improvement. CT femur 2023: Postsurgical changes of recent ORIF of the proximal right femur.Prominent edema and ill-defined fluid within the medial subcutaneous tissues of the upper right thigh, concerning for soft tissue infection/cellulitis. No organized or rim-enhancing collection.Healing fracture involving the left greater  trochanter with mild displacement.  Limited left hip external rotation and tenderness over lateral hip joint. Get hip x-ray

## 2023-09-29 NOTE — Assessment & Plan Note (Addendum)
 3 scaly lesions on parietal region on scalp, no erythema or tenderness. Hx of squamous cell carcinoma of R. LE  Entered referral to dermatology

## 2023-09-29 NOTE — Assessment & Plan Note (Signed)
 Repeat vit. D Advised to schedule appointment for bone density scan

## 2023-09-29 NOTE — Assessment & Plan Note (Signed)
 Repeat lipid panel Current use of crestor

## 2023-09-29 NOTE — Assessment & Plan Note (Signed)
 BP at goal with amlodipine , hydralazine  and imdur  BP Readings from Last 3 Encounters:  09/29/23 134/72  01/06/23 138/72  10/14/22 (!) 146/74    Refill sent Maintain med doses

## 2023-09-29 NOTE — Assessment & Plan Note (Addendum)
 Stable mood with lexapro  20mg . Persistent Compulsive need to pick at scarps which leads to small open wounds. Advised to consider engaging in hobbies which serve as a distraction e.g puzzle, arts/crafts.

## 2023-10-06 ENCOUNTER — Other Ambulatory Visit: Payer: Self-pay

## 2023-10-06 ENCOUNTER — Telehealth: Payer: Self-pay

## 2023-10-06 NOTE — Telephone Encounter (Signed)
 Copied from CRM [id number]. Topic: Clinical - Lab/Test Results >> Oct 05, 2023  2:17 PM Viola F wrote: Reason for CRM: Patient would like a call back from [facility] or [facility] regarding her x-ray results from 09/29/23

## 2023-10-07 NOTE — Telephone Encounter (Signed)
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  I entered an urgent referral to ortho due to pain and limited joint range of motion ----- Message ----- From: Interface, Rad Results In Sent: 10/07/2023  11:47 AM EDT To: Roselie Bishop Mood, NP

## 2023-10-08 ENCOUNTER — Other Ambulatory Visit: Payer: Self-pay

## 2023-10-08 NOTE — Telephone Encounter (Signed)
 Called and left a detailed voice message per DPR on file of Stacie [name]'s comments and asked patient to give me a call back at [phone].

## 2023-10-16 ENCOUNTER — Other Ambulatory Visit: Payer: Self-pay | Admitting: Nurse Practitioner

## 2023-10-16 DIAGNOSIS — I10 Essential (primary) hypertension: Secondary | ICD-10-CM

## 2023-10-16 DIAGNOSIS — I700 Atherosclerosis of aorta: Secondary | ICD-10-CM

## 2023-10-16 DIAGNOSIS — E782 Mixed hyperlipidemia: Secondary | ICD-10-CM

## 2023-10-20 ENCOUNTER — Other Ambulatory Visit: Payer: Self-pay

## 2023-10-20 ENCOUNTER — Other Ambulatory Visit (HOSPITAL_COMMUNITY): Payer: Self-pay

## 2023-10-21 ENCOUNTER — Other Ambulatory Visit (HOSPITAL_COMMUNITY): Payer: Self-pay

## 2023-10-21 ENCOUNTER — Other Ambulatory Visit: Payer: Self-pay | Admitting: Nurse Practitioner

## 2023-10-21 DIAGNOSIS — E782 Mixed hyperlipidemia: Secondary | ICD-10-CM

## 2023-10-21 DIAGNOSIS — I700 Atherosclerosis of aorta: Secondary | ICD-10-CM

## 2023-10-21 MED ORDER — ESCITALOPRAM OXALATE 20 MG PO TABS
20.0000 mg | ORAL_TABLET | Freq: Every day | ORAL | 2 refills | Status: AC
  Filled 2023-10-25 – 2023-11-17 (×2): qty 30, 30d supply, fill #0
  Filled 2023-12-17: qty 30, 30d supply, fill #1

## 2023-10-21 MED ORDER — AMLODIPINE BESYLATE 10 MG PO TABS
10.0000 mg | ORAL_TABLET | Freq: Every day | ORAL | 1 refills | Status: AC
  Filled 2023-10-22 – 2023-10-25 (×2): qty 90, 90d supply, fill #0

## 2023-10-21 NOTE — Telephone Encounter (Signed)
 Called patient to confirm that she is no longer taking the Simvastatin  (Zocor ) 40 mg daily. Patient confirmed that she is not taking that medication that Roselie changed her to the new medication a long time ago. She also stated that she thought she canceled the request when they called her about needing a refill. Asked patient if she is needing a refill on the Rosuvastatin  (Crestor ) 40 mg and she said no. Called patient's pharmacy and made aware of medication change and to remove the Zocor  from patient's profile.

## 2023-10-22 ENCOUNTER — Other Ambulatory Visit: Payer: Self-pay

## 2023-10-22 ENCOUNTER — Other Ambulatory Visit (HOSPITAL_COMMUNITY): Payer: Self-pay

## 2023-10-22 MED FILL — Isosorbide Mononitrate Tab ER 24HR 30 MG: ORAL | 90 days supply | Qty: 90 | Fill #0 | Status: CN

## 2023-10-25 ENCOUNTER — Other Ambulatory Visit: Payer: Self-pay

## 2023-10-25 ENCOUNTER — Other Ambulatory Visit (HOSPITAL_COMMUNITY): Payer: Self-pay

## 2023-10-25 MED FILL — Rosuvastatin Calcium Tab 40 MG: ORAL | 30 days supply | Qty: 30 | Fill #0 | Status: AC

## 2023-10-25 MED FILL — Hydralazine HCl Tab 100 MG: ORAL | 90 days supply | Qty: 270 | Fill #0 | Status: CN

## 2023-10-25 MED FILL — Hydralazine HCl Tab 100 MG: ORAL | 30 days supply | Qty: 90 | Fill #0 | Status: CN

## 2023-10-25 MED FILL — Isosorbide Mononitrate Tab ER 24HR 30 MG: ORAL | 30 days supply | Qty: 30 | Fill #0 | Status: AC

## 2023-10-26 ENCOUNTER — Other Ambulatory Visit: Payer: Self-pay

## 2023-10-27 ENCOUNTER — Other Ambulatory Visit: Payer: Self-pay

## 2023-11-12 ENCOUNTER — Ambulatory Visit

## 2023-11-12 DIAGNOSIS — Z0000 Encounter for general adult medical examination without abnormal findings: Secondary | ICD-10-CM

## 2023-11-12 NOTE — Progress Notes (Signed)
 Subjective:   Stacie [name] is a 68 y.o. who presents for a Medicare Wellness preventive visit.  As a reminder, Annual Wellness Visits don't include a physical exam, and some assessments may be limited, especially if this visit is performed virtually. We may recommend an in-person follow-up visit with your provider if needed.  Visit Complete: Virtual I connected with  Stacie [name] on 11/12/23 by a audio enabled telemedicine application and verified that I am speaking with the correct person using two identifiers.  Patient Location: Home  Provider Location: Office/Clinic  I discussed the limitations of evaluation and management by telemedicine. The patient expressed understanding and agreed to proceed.  Vital Signs: Because this visit was a virtual/telehealth visit, some criteria may be missing or patient reported. Any vitals not documented were not able to be obtained and vitals that have been documented are patient reported.  VideoError- Librarian, academic were attempted between this provider and patient, however failed, due to patient having technical difficulties OR patient did not have access to video capability.  We continued and completed visit with audio only.   Persons Participating in Visit: Patient.  AWV Questionnaire: No: Patient Medicare AWV questionnaire was not completed prior to this visit.  Cardiac Risk Factors include: advanced age (>68men, >97 women);dyslipidemia;hypertension     Objective:    Today's Vitals   There is no height or weight on file to calculate BMI.     11/12/2023    8:14 AM 11/06/2022    8:20 AM 07/16/2021   12:55 PM 03/20/2021    2:57 PM 02/06/2020   12:49 PM 08/23/2019    8:26 AM 05/29/2019    2:12 PM  Advanced Directives  Does Patient Have a Medical Advance Directive? Yes Yes Yes Unable to assess, patient is non-responsive or altered mental status Yes No No  Type of Advance Directive  Healthcare Power of [place];Living will Healthcare Power of [employer] Power of Attorney      Does patient want to make changes to medical advance directive?     Yes (MAU/Ambulatory/Procedural Areas - Information given)    Copy of Healthcare Power of Attorney in Chart? No - copy requested No - copy requested No - copy requested      Would patient like information on creating a medical advance directive?      No - Patient declined No - Patient declined    Current Medications (verified) Outpatient Encounter Medications as of 11/12/2023  Medication Sig   amLODipine  (NORVASC ) 10 MG tablet Take 1 tablet (10 mg total) by mouth daily.   escitalopram  (LEXAPRO ) 20 MG tablet Take 1 tablet (20 mg total) by mouth daily.   folic acid  (FOLVITE ) 1 MG tablet Take 1 tablet (1 mg total) by mouth daily.   hydrALAZINE  (APRESOLINE ) 100 MG tablet Take 1 tablet (100 mg total) by mouth 3 (three) times daily.   isosorbide  mononitrate (IMDUR ) 30 MG 24 hr tablet Take 1 tablet (30 mg total) by mouth daily.   Multiple Vitamin (MULTIVITAMIN WITH MINERALS) TABS tablet Take 1 tablet by mouth daily. (Patient taking differently: Take 1 tablet by mouth every other day.)   rosuvastatin  (CRESTOR ) 40 MG tablet Take 1 tablet (40 mg total) by mouth every evening.   amLODipine  (NORVASC ) 10 MG tablet Take 1 tablet (10 mg total) by mouth daily.   escitalopram  (LEXAPRO ) 20 MG tablet Take 1 tablet (20 mg total) by mouth daily.   No facility-administered encounter medications on file as of  11/12/2023.    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   Thyroid  disease    Past Surgical History:  Procedure Laterality Date   FEMUR IM NAIL Right 03/20/2021   Procedure: INTRAMEDULLARY (IM) NAIL FEMORAL;  Surgeon: Josefina Chew, MD;  Location: [facility];  Service: Orthopedics;  Laterality: Right;   Family History  Problem Relation Age of Onset   Stroke Mother 11       embolic   Heart disease Father 75       CAd with CABG, no MI   Heart disease Sister 54       CAD   Heart disease Paternal Uncle    Heart disease Maternal Grandmother    Heart disease Paternal Grandfather    Colon cancer Neg Hx    Esophageal cancer Neg Hx    Stomach cancer Neg Hx    BRCA 1/2 Neg Hx    Breast cancer Neg Hx    Social History   Socioeconomic History   Marital status: Divorced    Spouse name: Not on file   Number of children: 2   Years of education: Not on file   Highest education level: 10th grade  Occupational History   Occupation: retired  Tobacco Use   Smoking status: Former    Current packs/day: 0.00    Average packs/day: 0.3 packs/day for 10.0 years (2.5 ttl pk-yrs)    Types: Cigarettes    Start date: 05/2009    Quit date: 05/2019    Years since quitting: 4.5   Smokeless tobacco: Never  Vaping Use   Vaping status: Never Used  Substance and Sexual Activity   Alcohol use: Not Currently   Drug use: Yes    Frequency: 7.0 times per week    Types: Marijuana    Comment: to help with colitis- multiple times a day   Sexual activity: Not Currently  Other Topics Concern   Not on file  Social History Narrative   Not on file   Social Drivers of Health   Financial Resource Strain: Low Risk  (11/12/2023)   Overall Financial Resource Strain (CARDIA)    Difficulty of Paying Living Expenses: Not hard at all  Food Insecurity: No Food Insecurity (11/12/2023)   Hunger Vital Sign    Worried About Running Out of Food in the Last Year: Never true     Ran Out of Food in the Last Year: Never true  Transportation Needs: No Transportation Needs (11/12/2023)   PRAPARE - Administrator, Civil Service (Medical): No    Lack of Transportation (Non-Medical): No  Physical Activity: Insufficiently Active (11/12/2023)   Exercise Vital Sign    Days of Exercise per Week: 7 days    Minutes of Exercise per Session: 20 min  Stress: No Stress Concern Present (11/12/2023)   [employer] of Occupational Health - Occupational Stress Questionnaire    Feeling of Stress: Not at all  Social Connections: Socially Isolated (11/12/2023)   Social Connection and Isolation Panel    Frequency of  Communication with Friends and Family: More than three times a week    Frequency of Social Gatherings with Friends and Family: Twice a week    Attends Religious Services: Never    Database administrator or Organizations: No    Attends Engineer, structural: Never    Marital Status: Divorced    Tobacco Counseling Counseling given: Not Answered    Clinical Intake:  Pre-visit preparation completed: Yes  Pain : No/denies pain     Nutritional Risks: None Diabetes: No  Lab Results  Component Value Date   HGBA1C 5.6 10/14/2022   HGBA1C 5.8 10/09/2021   HGBA1C 5.7 03/13/2020     How often do you need to have someone help you when you read instructions, pamphlets, or other written materials from your doctor or pharmacy?: 1 - Never  Interpreter Needed?: No  Information entered by :: NAllen LPN   Activities of Daily Living     11/12/2023    8:08 AM 11/12/2023    8:00 AM  In your present state of health, do you have any difficulty performing the following activities:  Hearing? 1 0  Vision? 0 0  Difficulty concentrating or making decisions? 1 1  Comment memory   Walking or climbing stairs? 0 1  Dressing or bathing? 0 0  Doing errands, shopping? 0 1  Preparing Food and eating ? N N  Using the Toilet? N N  In the past six  months, have you accidently leaked urine? N N  Do you have problems with loss of bowel control? N N  Managing your Medications? N N  Managing your Finances? N N  Housekeeping or managing your Housekeeping? N N    Patient Care Team: Nche, Roselie Rockford, NP as PCP - General (Internal Medicine)  I have updated your Care Teams any recent Medical Services you may have received from other providers in the past year.     Assessment:   This is a routine wellness examination for Stacie [name].  Hearing/Vision screen Hearing Screening - Comments:: States has trouble hearing, but no hearing aids Vision Screening - Comments:: Regular eye exams   Goals Addressed             This Visit's Progress    Patient Stated       11/12/2023, wants to walk better due to broken hips       Depression Screen     11/12/2023    8:16 AM 01/06/2023   10:00 AM 11/06/2022    8:21 AM 10/14/2022   12:04 PM 10/09/2021    1:37 PM 07/16/2021   12:56 PM 02/06/2020   12:52 PM  PHQ 2/9 Scores  PHQ - 2 Score 0 0 0 0 0 0 0  PHQ- 9 Score 0  1 0 0      Fall Risk     11/12/2023    8:15 AM 11/12/2023    8:00 AM 01/06/2023   10:00 AM 11/06/2022    8:21 AM 10/14/2022   12:04 PM  Fall Risk   Falls in the past year? 0 1 0 0 0  Number falls in past yr: 0 0 0 0 0  Injury with Fall? 0 0 0 0 0  Risk for fall due to : Medication side effect;Impaired balance/gait  No Fall Risks Medication side effect;Impaired mobility;Impaired balance/gait No Fall Risks  Follow up Falls evaluation completed;Falls prevention discussed  Falls evaluation completed Falls prevention discussed;Falls evaluation completed Falls evaluation completed  MEDICARE RISK AT HOME:  Medicare Risk at Home Any stairs in or around the home?: Yes If so, are there any without handrails?: No Home free of loose throw rugs in walkways, pet beds, electrical cords, etc?: Yes Adequate lighting in your home to reduce risk of falls?: Yes Life alert?: No Use of a  cane, walker or w/c?: No Grab bars in the bathroom?: No Shower chair or bench in shower?: Yes Elevated toilet seat or a handicapped toilet?: Yes  TIMED UP AND GO:  Was the test performed?  No  Cognitive Function: 6CIT completed    03/13/2020   12:19 PM  MMSE - Mini Mental State Exam  Orientation to time 5  Orientation to Place 5  Registration 2  Attention/ Calculation 5  Recall 2  Language- name 2 objects 2  Language- repeat 1  Language- follow 3 step command 3  Language- read & follow direction 1  Write a sentence 1  Copy design 1  Total score 28        11/12/2023    8:16 AM 11/06/2022    8:22 AM 02/06/2020   12:57 PM  6CIT Screen  What Year? 0 points 0 points 0 points  What month? 0 points 0 points 0 points  What time? 0 points 0 points 0 points  Count back from 20 0 points 0 points 0 points  Months in reverse 4 points 4 points 4 points  Repeat phrase 0 points 0 points 0 points  Total Score 4 points 4 points 4 points    Immunizations  There is no immunization history on file for this patient.  Screening Tests Health Maintenance  Topic Date Due   COVID-19 Vaccine (1) Never done   Pneumococcal Vaccine: 50+ Years (1 of 2 - PCV) Never done   DEXA SCAN  Never done   Medicare Annual Wellness (AWV)  11/11/2024   Mammogram  12/07/2024   Colonoscopy  08/31/2028   Hepatitis C Screening  Completed   Meningococcal B Vaccine  Aged Out   DTaP/Tdap/Td  Discontinued   Influenza Vaccine  Discontinued   Zoster Vaccines- Shingrix  Discontinued    Health Maintenance Items Addressed: Declines vaccines. States will get DEXA when gets mammogram.  Additional Screening:  Vision Screening: Recommended annual ophthalmology exams for early detection of glaucoma and other disorders of the eye. Is the patient up to date with their annual eye exam?  Yes  Who is the provider or what is the name of the office in which the patient attends annual eye exams? Can't remember  name  Dental Screening: Recommended annual dental exams for proper oral hygiene  Community Resource Referral / Chronic Care Management: CRR required this visit?  No   CCM required this visit?  No   Plan:    I have personally reviewed and noted the following in the patient's chart:   Medical and social history Use of alcohol, tobacco or illicit drugs  Current medications and supplements including opioid prescriptions. Patient is not currently taking opioid prescriptions. Functional ability and status Nutritional status Physical activity Advanced directives List of other physicians Hospitalizations, surgeries, and ER visits in previous 12 months Vitals Screenings to include cognitive, depression, and falls Referrals and appointments  In addition, I have reviewed and discussed with patient certain preventive protocols, quality metrics, and best practice recommendations. A written personalized care plan for preventive services as well as general preventive health recommendations were provided to patient.   Stacie FORBES Dawn, LPN  11/12/2023   After Visit Summary: (MyChart) Due to this being a telephonic visit, the after visit summary with patients personalized plan was offered to patient via MyChart   Notes: Nothing significant to report at this time.

## 2023-11-12 NOTE — Patient Instructions (Signed)
 [name],  Thank you for taking the time for your Medicare Wellness Visit. I appreciate your continued commitment to your health goals. Please review the care plan we discussed, and feel free to reach out if I can assist you further.  Medicare recommends these wellness visits once per year to help you and your care team stay ahead of potential health issues. These visits are designed to focus on prevention, allowing your provider to concentrate on managing your acute and chronic conditions during your regular appointments.  Please note that Annual Wellness Visits do not include a physical exam. Some assessments may be limited, especially if the visit was conducted virtually. If needed, we may recommend a separate in-person follow-up with your provider.  Ongoing Care Seeing your primary care provider every 3 to 6 months helps us  monitor your health and provide consistent, personalized care.   Referrals If a referral was made during today's visit and you haven't received any updates within two weeks, please contact the referred provider directly to check on the status.  Recommended Screenings:  Health Maintenance  Topic Date Due   COVID-19 Vaccine (1) Never done   Pneumococcal Vaccine for age over 71 (1 of 2 - PCV) Never done   DEXA scan (bone density measurement)  Never done   Medicare Annual Wellness Visit  11/11/2024   Breast Cancer Screening  12/07/2024   Colon Cancer Screening  08/31/2028   Hepatitis C Screening  Completed   Meningitis B Vaccine  Aged Out   DTaP/Tdap/Td vaccine  Discontinued   Flu Shot  Discontinued   Zoster (Shingles) Vaccine  Discontinued       11/12/2023    8:14 AM  Advanced Directives  Does Patient Have a Medical Advance Directive? Yes  Type of Estate agent of [place];Living will  Copy of Healthcare Power of Attorney in Chart? No - copy requested   Advance Care Planning is important because it: Ensures you receive medical care  that aligns with your values, goals, and preferences. Provides guidance to your family and loved ones, reducing the emotional burden of decision-making during critical moments.  Vision: Annual vision screenings are recommended for early detection of glaucoma, cataracts, and diabetic retinopathy. These exams can also reveal signs of chronic conditions such as diabetes and high blood pressure.  Dental: Annual dental screenings help detect early signs of oral cancer, gum disease, and other conditions linked to overall health, including heart disease and diabetes.  Please see the attached documents for additional preventive care recommendations.

## 2023-11-16 ENCOUNTER — Ambulatory Visit: Admitting: Physician Assistant

## 2023-11-16 ENCOUNTER — Encounter: Payer: Self-pay | Admitting: Physician Assistant

## 2023-11-16 ENCOUNTER — Other Ambulatory Visit (HOSPITAL_COMMUNITY): Payer: Self-pay

## 2023-11-16 ENCOUNTER — Other Ambulatory Visit: Payer: Self-pay

## 2023-11-16 VITALS — BP 160/95 | HR 77

## 2023-11-16 DIAGNOSIS — L0100 Impetigo, unspecified: Secondary | ICD-10-CM

## 2023-11-16 DIAGNOSIS — W57XXXA Bitten or stung by nonvenomous insect and other nonvenomous arthropods, initial encounter: Secondary | ICD-10-CM

## 2023-11-16 DIAGNOSIS — R21 Rash and other nonspecific skin eruption: Secondary | ICD-10-CM | POA: Diagnosis not present

## 2023-11-16 MED ORDER — TRIAMCINOLONE ACETONIDE 0.1 % EX OINT
1.0000 | TOPICAL_OINTMENT | Freq: Two times a day (BID) | CUTANEOUS | 1 refills | Status: DC | PRN
  Filled 2023-11-16: qty 454, 100d supply, fill #0
  Filled 2023-11-16: qty 454, 90d supply, fill #0

## 2023-11-16 MED ORDER — CEPHALEXIN 500 MG PO CAPS
500.0000 mg | ORAL_CAPSULE | Freq: Three times a day (TID) | ORAL | 0 refills | Status: DC
  Filled 2023-11-16: qty 30, 10d supply, fill #0

## 2023-11-16 MED ORDER — CEPHALEXIN 500 MG PO CAPS
500.0000 mg | ORAL_CAPSULE | Freq: Three times a day (TID) | ORAL | 0 refills | Status: AC
  Filled 2023-11-16 (×2): qty 30, 10d supply, fill #0

## 2023-11-16 MED ORDER — TRIAMCINOLONE ACETONIDE 0.1 % EX OINT
1.0000 | TOPICAL_OINTMENT | Freq: Two times a day (BID) | CUTANEOUS | 11 refills | Status: DC | PRN
  Filled 2023-11-16: qty 60, 30d supply, fill #0

## 2023-11-16 NOTE — Patient Instructions (Signed)

## 2023-11-16 NOTE — Progress Notes (Signed)
   New Patient Visit   Subjective  Stacie [name] is a 68 y.o. female ESTABLISHED PATIENT who presents for the following: RASH ON LEGS AND SCALP   Patient states she has rash located at the whole body and scalp that she would like to have examined. Patient reports the areas have been there for 3 weeks. She reports the areas are bothersome.Patient rates irritation 3 out of 10. She states that the areas have spread. Patient reports she has not previously been treated for these areas. Patient denies Hx of bx.   Has had bed bugs several years ago. No other household member is affected. Does sleep alone.     The following portions of the chart were reviewed this encounter and updated as appropriate: medications, allergies, medical history  Review of Systems:  No other skin or systemic complaints except as noted in HPI or Assessment and Plan.  Objective  Well appearing patient in no apparent distress; mood and affect are within normal limits.  A full examination was performed including scalp, head, eyes, ears, nose, lips, neck, chest, axillae, abdomen, back, buttocks, bilateral upper extremities, bilateral lower extremities, hands, feet, fingers, toes, fingernails, and toenails. All findings within normal limits unless otherwise noted below.   Relevant exam findings are noted in the Assessment and Plan.                      SCABIES PREP negative for scybala, mites or ova    Assessment & Plan   RASH WITH ASSOCIATED IMPETIGO - ALL OVER TO INCLUDE SCALP  Exam: Excoriations and honey crusted patches and plaques (as seen in photos)   Differential diagnosis:  Insect bites vs: bed bugs   Treatment Plan: - call exterminator ASAP  - do not sleep in current bed  - wash with Dial soap, use triamcinolone  and start Keflex as directed  - f/u in 3 weeks     RASH AND OTHER NONSPECIFIC SKIN ERUPTION   INSECT BITE, UNSPECIFIED SITE, INITIAL ENCOUNTER   Related  Medications triamcinolone  ointment (KENALOG ) 0.1 % Apply 1 Application topically 2 (two) times daily as needed (Rash). IMPETIGO   Related Medications cephALEXin (KEFLEX) 500 MG capsule Take 1 capsule (500 mg total) by mouth in the morning, at noon, and at bedtime for 30 doses. Take one pill 3 times daily with a heavy meal  Return in about 3 weeks (around 12/07/2023) for rash follow up.  I, Doyce Pan, CMA, am acting as scribe for Yaritzi Craun K, PA-C.   Documentation: I have reviewed the above documentation for accuracy and completeness, and I agree with the above.  Holdan Stucke K, PA-C

## 2023-11-17 ENCOUNTER — Other Ambulatory Visit (HOSPITAL_COMMUNITY): Payer: Self-pay

## 2023-11-17 ENCOUNTER — Other Ambulatory Visit: Payer: Self-pay

## 2023-11-17 ENCOUNTER — Other Ambulatory Visit: Payer: Self-pay | Admitting: Nurse Practitioner

## 2023-11-17 DIAGNOSIS — I700 Atherosclerosis of aorta: Secondary | ICD-10-CM

## 2023-11-17 DIAGNOSIS — E782 Mixed hyperlipidemia: Secondary | ICD-10-CM

## 2023-11-17 MED ORDER — ROSUVASTATIN CALCIUM 40 MG PO TABS
40.0000 mg | ORAL_TABLET | Freq: Every evening | ORAL | 1 refills | Status: AC
  Filled 2023-11-17: qty 30, 30d supply, fill #0
  Filled 2023-12-17: qty 30, 30d supply, fill #1
  Filled 2024-01-17: qty 30, 30d supply, fill #2
  Filled 2024-02-17: qty 30, 30d supply, fill #3

## 2023-11-17 MED FILL — Isosorbide Mononitrate Tab ER 24HR 30 MG: ORAL | 30 days supply | Qty: 30 | Fill #1 | Status: AC

## 2023-11-22 ENCOUNTER — Other Ambulatory Visit: Payer: Self-pay

## 2023-11-25 ENCOUNTER — Telehealth: Payer: Self-pay | Admitting: Nurse Practitioner

## 2023-11-25 NOTE — Telephone Encounter (Signed)
 LVM and sent mychart msg for pt to call back to schedule Bone Density exam at [facility]. Transfer to [facility].

## 2023-12-06 ENCOUNTER — Ambulatory Visit: Admitting: Physician Assistant

## 2023-12-08 ENCOUNTER — Ambulatory Visit: Admitting: Physician Assistant

## 2023-12-08 DIAGNOSIS — M25552 Pain in left hip: Secondary | ICD-10-CM

## 2023-12-08 DIAGNOSIS — M25551 Pain in right hip: Secondary | ICD-10-CM

## 2023-12-08 NOTE — Progress Notes (Signed)
 Office Visit Note   Patient: Stacie [name]           Date of Birth: 03/01/1955           MRN: [id number] Visit Date: 12/08/2023              Requested by: [name], NP [address] [place],  [place] [address] PCP: [name], NP   Assessment & Plan: Visit Diagnoses:  1. Bilateral hip pain     Plan: Impression is bilateral hip trochanteric bursitis.  I have offered the patient IT band exercises as well as a cortisone injection.  She would like to first try the IT band exercises 1-2 times daily.  She will also pick up a tube of topical Voltaren.  If her symptoms do not improve or happen to worsen, she will follow-up for trochanteric bursa injection.  Otherwise, follow-up as needed.  Follow-Up Instructions: Return if symptoms worsen or fail to improve.   Orders:  No orders of the defined types were placed in this encounter.  No orders of the defined types were placed in this encounter.     Procedures: No procedures performed   Clinical Data: No additional findings.   Subjective: Chief Complaint  Patient presents with   Left Hip - Pain    HPI patient is a very pleasant 68 year old female who comes in today with bilateral hip pain left greater than right.  Left hip pain started about 1 to 2 months ago without any injury or change in activity.  Right pain began 1 to 2 weeks ago without any injury or change in activity.  The pain she has to both hips occurs to the lateral aspect with very occasional anterior thigh pain.  No groin pain.  In regards to the left hip, her pain is aggravated when she is turning a certain way as well as when she is lying on the left side.  Right hip bothers her only when she is lying on the right side.  She takes occasional over-the-counter NSAIDs with some relief.  No history of lumbar pathology.  She denies any paresthesias down either lower extremity.  Of note, she is status post right hip IM nail from an  intertrochanteric femur fracture by Dr. Josefina a few years back.  Review of Systems as detailed in HPI.  All others reviewed and are negative.   Objective: Vital Signs: There were no vitals taken for this visit.  Physical Exam well-developed well-nourished female in no acute distress.  Alert and oriented x 3.  Ortho Exam bilateral hip exam: Mild tenderness to the greater trochanter both sides.  No pain with logroll or FADIR testing.  Negative straight leg raise.  She is neurovascularly intact distally.  Specialty Comments:  No specialty comments available.  Imaging: X-rays reviewed by me in canopy show stable right hip IM nail without degenerative changes to the hip joint.  Left hip shows what appears to be a healed greater trochanteric femur fracture with significant callus formation and osteophyte formation.  Well-preserved hip joint.   PMFS History: Patient Active Problem List   Diagnosis Date Noted   Chronic left hip pain 09/29/2023   Scalp lesion 09/29/2023   Macrocytic anemia 10/14/2022   History of squamous cell carcinoma excision 07/22/2022   Elevated TSH 10/09/2021   Vitamin D  deficiency 04/16/2021   History of hip fracture 04/16/2021   Aortic atherosclerosis 06/20/2019   Underweight 05/05/2019   Depression 05/05/2019   Former  tobacco use 05/05/2019   Marijuana use 05/05/2019   History of alcohol use disorder 05/05/2019   Chronic diastolic CHF (congestive heart failure) (HCC) 05/05/2019   History of CVA with residual deficit 05/04/2019   Chronic diarrhea 08/26/2018   PAD (peripheral artery disease) 12/06/2017   Claudication of right lower extremity 12/06/2017   Hyperlipidemia 02/28/2016   Essential hypertension 02/21/2016   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 (CVA) 02/21/2016   Hearing loss 02/21/2016   Past Medical History:  Diagnosis Date   Acute metabolic encephalopathy 03/25/2021   Colitis    Dysphagia 03/25/2021   Hyperlipidemia     Hypertension    Protein-calorie malnutrition, severe 05/16/2019   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   Right Lower Leg Anterior- MOHS Completed on 08/17/22   SCC (squamous cell carcinoma), arm, right 07/09/2022   Stroke (HCC) 04/2012   unknown type   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   Thyroid  disease     Family History  Problem Relation Age of Onset   Stroke Mother 86       embolic   Heart disease Father 57       CAd with CABG, no MI   Heart disease Sister 60       CAD   Heart disease Paternal Uncle    Heart disease Maternal Grandmother    Heart disease Paternal Grandfather    Colon cancer Neg Hx    Esophageal cancer Neg Hx    Stomach cancer Neg Hx    BRCA 1/2 Neg Hx    Breast cancer Neg Hx     Past Surgical History:  Procedure Laterality Date   FEMUR IM NAIL Right 03/20/2021   Procedure: INTRAMEDULLARY (IM) NAIL FEMORAL;  Surgeon: Josefina Chew, MD;  Location: [facility];  Service: Orthopedics;  Laterality: Right;   Social History   Occupational History   Occupation: retired  Tobacco Use   Smoking status: Former    Current packs/day: 0.00    Average packs/day: 0.3 packs/day for 10.0 years (2.5 ttl pk-yrs)    Types: Cigarettes    Start date: 05/2009    Quit date: 05/2019    Years since quitting: 4.6   Smokeless tobacco: Never  Vaping Use   Vaping status: Never Used  Substance and Sexual Activity   Alcohol use: Not Currently   Drug use: Yes    Frequency: 7.0 times per week    Types: Marijuana    Comment: to help with colitis- multiple times a day   Sexual activity: Not Currently

## 2023-12-17 ENCOUNTER — Other Ambulatory Visit: Payer: Self-pay

## 2023-12-17 MED FILL — Isosorbide Mononitrate Tab ER 24HR 30 MG: ORAL | 30 days supply | Qty: 30 | Fill #2 | Status: AC

## 2023-12-20 ENCOUNTER — Other Ambulatory Visit: Payer: Self-pay

## 2023-12-23 ENCOUNTER — Other Ambulatory Visit: Payer: Self-pay

## 2023-12-29 ENCOUNTER — Other Ambulatory Visit: Payer: Self-pay

## 2024-01-17 ENCOUNTER — Other Ambulatory Visit: Payer: Self-pay

## 2024-01-17 MED FILL — Isosorbide Mononitrate Tab ER 24HR 30 MG: ORAL | 30 days supply | Qty: 30 | Fill #3 | Status: AC

## 2024-01-18 ENCOUNTER — Other Ambulatory Visit: Payer: Self-pay

## 2024-01-22 ENCOUNTER — Ambulatory Visit
Admission: EM | Admit: 2024-01-22 | Discharge: 2024-01-22 | Disposition: A | Discharge status: Home / Self Care | Attending: Emergency Medicine | Admitting: Emergency Medicine

## 2024-01-22 DIAGNOSIS — L03115 Cellulitis of right lower limb: Secondary | ICD-10-CM

## 2024-01-22 DIAGNOSIS — R21 Rash and other nonspecific skin eruption: Secondary | ICD-10-CM

## 2024-01-22 DIAGNOSIS — W57XXXA Bitten or stung by nonvenomous insect and other nonvenomous arthropods, initial encounter: Secondary | ICD-10-CM

## 2024-01-22 MED ORDER — CEPHALEXIN 500 MG PO CAPS: 500.0000 mg | ORAL_CAPSULE | Freq: Three times a day (TID) | ORAL | 0 refills | Status: AC

## 2024-01-22 MED ORDER — TRIAMCINOLONE ACETONIDE 0.1 % EX OINT: 1.0000 | TOPICAL_OINTMENT | Freq: Two times a day (BID) | CUTANEOUS | 1 refills | Status: AC | PRN

## 2024-01-22 NOTE — Discharge Instructions (Signed)
 You are seen at urgent care today for concerns of leg swelling and rash.  If there does appear to be some concern for infection in this area/started on a course of antibiotics he will take 3 times a day for the next 10 days.  I have also started you on a topical steroid cream, triamcinolone , to help with dress some general itching and inflammation in this area.  Please take this as prescribed.  If you notice any significant increase or worsening of the swelling, shortness of breath, please seek evaluation in the emergency department.

## 2024-01-22 NOTE — ED Triage Notes (Signed)
 Pt presents with chronic rash on bilateral leg and chest for several months. Presents today due to rash worsening and bilateral leg swelling, redness.  She has been seen by derm and treated with abx and cream for rash in past.

## 2024-01-22 NOTE — ED Provider Notes (Signed)
 [facility]    CSN: [id number] Arrival date & time: 01/22/24  1222      History   Chief Complaint Chief Complaint  Patient presents with   Leg Swelling   Rash    HPI Stacie [name] is a 68 y.o. female.  Patient with past history significant for CHF, peripheral artery disease, skin cancer presents to urgent care today with concerns of leg swelling and rash.  Reports about 1 week of right lower leg swelling and rash that she describes as itchy.  Denies any fever, chills or bodyaches.  States some warmth to the area but denies any significant heat to this leg.  States that she has had some clear/yellow discharge coming from multiple wounds in the right lower leg but denies any pus draining.   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   Thyroid  disease     Patient Active Problem List   Diagnosis Date Noted   Chronic left hip pain 09/29/2023   Scalp lesion 09/29/2023   Macrocytic anemia 10/14/2022   History of squamous cell carcinoma excision 07/22/2022   Elevated TSH 10/09/2021   Vitamin D  deficiency 04/16/2021   History of hip fracture 04/16/2021   Aortic atherosclerosis 06/20/2019   Underweight 05/05/2019   Depression 05/05/2019   Former tobacco use 05/05/2019   Marijuana use 05/05/2019   History of alcohol use disorder 05/05/2019   Chronic diastolic CHF (congestive heart failure) (HCC) 05/05/2019   History of CVA with residual deficit 05/04/2019   Chronic diarrhea 08/26/2018   PAD (peripheral artery disease) 12/06/2017   Claudication of right  lower extremity 12/06/2017   Hyperlipidemia 02/28/2016   Essential hypertension 02/21/2016   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      Home Medications    Prior to Admission medications  Medication Sig Start Date End Date Taking? Authorizing Provider  cephALEXin  (KEFLEX ) 500 MG capsule Take 1 capsule (500 mg total) by mouth 3 (three) times daily for 10 days. 01/22/24 02/01/24 Yes Tylee Yum A, PA-C  amLODipine  (NORVASC ) 10 MG tablet Take 1 tablet (10 mg total) by mouth daily. 09/29/23   Nche, Roselie Rockford, NP  amLODipine  (NORVASC ) 10 MG tablet Take 1 tablet (10 mg total) by mouth daily. 01/06/23   Nche, Roselie Rockford, NP  escitalopram  (LEXAPRO ) 20 MG tablet Take 1 tablet (20 mg total) by mouth daily. 09/29/23   Nche, Roselie Rockford, NP  escitalopram  (LEXAPRO ) 20 MG tablet Take 1 tablet (20 mg total) by mouth daily. 09/06/23   Nche, Roselie Rockford, NP  folic acid  (FOLVITE ) 1 MG tablet Take 1 tablet (1 mg total) by mouth daily. 09/29/23   Nche, Roselie Rockford, NP  hydrALAZINE  (APRESOLINE ) 100 MG tablet Take 1 tablet (100  mg total) by mouth 3 (three) times daily. 10/18/23   Nche, Roselie Rockford, NP  isosorbide  mononitrate (IMDUR ) 30 MG 24 hr tablet Take 1 tablet (30 mg total) by mouth daily. 10/18/23   Nche, Roselie Rockford, NP  Multiple Vitamin (MULTIVITAMIN WITH MINERALS) TABS tablet Take 1 tablet by mouth daily. Patient taking differently: Take 1 tablet by mouth every other day. 03/29/21   Pokhrel, Laxman, MD  rosuvastatin  (CRESTOR ) 40 MG tablet Take 1 tablet (40 mg total) by mouth every evening. 11/17/23   Nche, Roselie Rockford, NP  triamcinolone  ointment (KENALOG ) 0.1 %  Apply 1 Application topically 2 (two) times daily as needed (Rash). 01/22/24   Dick Hark A, PA-C    Family History Family History  Problem Relation Age of Onset   Stroke Mother 86       embolic   Heart disease Father 34       CAd with CABG, no MI   Heart disease Sister 29       CAD   Heart disease Paternal Uncle    Heart disease Maternal Grandmother    Heart disease Paternal Grandfather    Colon cancer Neg Hx    Esophageal cancer Neg Hx    Stomach cancer Neg Hx    BRCA 1/2 Neg Hx    Breast cancer Neg Hx     Social History Social History[1]   Allergies   Trazodone  and nefazodone and Wellbutrin  [bupropion ]   Review of Systems Review of Systems  Skin:  Positive for rash.  All other systems reviewed and are negative.    Physical Exam Triage Vital Signs ED Triage Vitals  Encounter Vitals Group     BP 01/22/24 1259 (!) 168/77     Girls Systolic BP Percentile --      Girls Diastolic BP Percentile --      Boys Systolic BP Percentile --      Boys Diastolic BP Percentile --      Pulse Rate 01/22/24 1259 74     Resp 01/22/24 1259 17     Temp 01/22/24 1259 (!) 97.5 F (36.4 C)     Temp Source 01/22/24 1259 Oral     SpO2 01/22/24 1259 95 %     Weight --      Height --      Head Circumference --      Peak Flow --      Pain Score 01/22/24 1304 5     Pain Loc --      Pain Education --      Exclude from Growth Chart --    No data found.  Updated Vital Signs BP (!) 168/77 (BP Location: Right Arm)   Pulse 74   Temp (!) 97.5 F (36.4 C) (Oral)   Resp 17   SpO2 95%   Visual Acuity Right Eye Distance:   Left Eye Distance:   Bilateral Distance:    Right Eye Near:   Left Eye Near:    Bilateral Near:     Physical Exam Vitals and nursing note reviewed.  Constitutional:      General: She is not in acute distress.    Appearance: She is well-developed.  HENT:     Head: Normocephalic and atraumatic.  Eyes:     Conjunctiva/sclera: Conjunctivae normal.   Cardiovascular:     Rate and Rhythm: Normal rate and regular rhythm.     Heart sounds: No murmur heard. Pulmonary:     Effort: Pulmonary effort is normal.  No respiratory distress.     Breath sounds: Normal breath sounds.  Abdominal:     Palpations: Abdomen is soft.     Tenderness: There is no abdominal tenderness.  Musculoskeletal:        General: No swelling.     Cervical back: Neck supple.     Right lower leg: No edema.     Left lower leg: No edema.  Skin:    General: Skin is warm and dry.     Capillary Refill: Capillary refill takes less than 2 seconds.     Findings: Erythema and rash present.     Comments: Right lower leg with rash developing to the lower portion with surrounding erythema, and minimal amount of swelling but no pitting edema.  There is slight warmth in this area. Drainage that I can appreciate it clear.  Neurological:     Mental Status: She is alert.  Psychiatric:        Mood and Affect: Mood normal.      UC Treatments / Results  Labs (all labs ordered are listed, but only abnormal results are displayed) Labs Reviewed - No data to display  EKG   Radiology No results found.  Procedures Procedures (including critical care time)  Medications Ordered in UC Medications - No data to display  Initial Impression / Assessment and Plan / UC Course  I have reviewed the triage vital signs and the nursing notes.  Pertinent labs & imaging results that were available during my care of the patient were reviewed by me and considered in my medical decision making (see chart for details).     This patient presents to the UC for concern of leg swelling, rash.  Differential diagnosis includes cellulitis, pitting edema, CHF exacerbation, folliculitis, nonspecific rash eruption    Additional history obtained:  Additional history obtained from chart review   Problem List / UC Course:  Patient with past history significant for CHF, peripheral artery disease,  skin cancer presents urgent care with concerns of leg swelling and rash.  Reports about 1 week of right lower leg swelling and rash that she describes as itchy.  Reports that there is some drainage come from the wounds but she believes it this is likely from her itching and scratching over the area.  Denies any significant pain.  Does report some warmth to the area but this has been largely minimal.  She does report that she is previously seen by dermatology several months ago for similar concerns and was started on antibiotics and a topical cream that were significant helpful for her. Exam reveals an area of erythema to the right lower leg with minimal edematous tissue.  There is no appreciable skin induration.  There are multiple wounds that are somewhat concentrated around the follicles of lower legs, possible folliculitis.  Given slight warmth and drainage from these wounds, I am concerned for possible cellulitis.  Will start patient on course of antibiotics. As patient is not currently endorsing any shortness of breath, and there is no visible pitting edema, low concern for CHF exacerbation.  Will start patient on antibiotics and topical triamcinolone  cream given this, nation is what seems to have helped in the most last time she had similar episode.  Advised patient to follow-up closely with PCP and dermatologist for repeat evaluation.  She is otherwise stable for outpatient follow-up and discharged home.   Social Determinants of Health:  None  Final Clinical Impressions(s) / UC Diagnoses   Final diagnoses:  Cellulitis of right lower extremity  Rash and nonspecific skin eruption     Discharge Instructions      You are seen at urgent care today for concerns of leg swelling and rash.  If there does appear to be some concern for infection in this area/started on a course of antibiotics he will take 3 times a day for the next 10 days.  I have also started you on a topical steroid cream,  triamcinolone , to help with dress some general itching and inflammation in this area.  Please take this as prescribed.  If you notice any significant increase or worsening of the swelling, shortness of breath, please seek evaluation in the emergency department.    ED Prescriptions     Medication Sig Dispense Auth. Provider   cephALEXin  (KEFLEX ) 500 MG capsule Take 1 capsule (500 mg total) by mouth 3 (three) times daily for 10 days. 30 capsule Jajaira Ruis A, PA-C   triamcinolone  ointment (KENALOG ) 0.1 % Apply 1 Application topically 2 (two) times daily as needed (Rash). 453 g Rylei Masella A, PA-C      PDMP not reviewed this encounter.    [1]  Social History Tobacco Use   Smoking status: Former    Current packs/day: 0.00    Average packs/day: 0.3 packs/day for 10.0 years (2.5 ttl pk-yrs)    Types: Cigarettes    Start date: 05/2009    Quit date: 05/2019    Years since quitting: 4.7   Smokeless tobacco: Never  Vaping Use   Vaping status: Never Used  Substance Use Topics   Alcohol use: Not Currently   Drug use: Yes    Frequency: 7.0 times per week    Types: Marijuana    Comment: to help with colitis- multiple times a day     Cecily Legrand LABOR, PA-C 01/22/24 1323  "

## 2024-02-17 ENCOUNTER — Other Ambulatory Visit: Payer: Self-pay

## 2024-02-17 MED FILL — Isosorbide Mononitrate Tab ER 24HR 30 MG: ORAL | 30 days supply | Qty: 30 | Fill #4 | Status: AC

## 2024-02-17 MED FILL — Hydralazine HCl Tab 100 MG: ORAL | 30 days supply | Qty: 90 | Fill #0 | Status: AC

## 2024-02-18 ENCOUNTER — Other Ambulatory Visit (HOSPITAL_COMMUNITY): Payer: Self-pay

## 2024-02-23 ENCOUNTER — Ambulatory Visit: Admitting: Physician Assistant

## 2024-02-23 DIAGNOSIS — M7062 Trochanteric bursitis, left hip: Secondary | ICD-10-CM

## 2024-02-23 DIAGNOSIS — M7061 Trochanteric bursitis, right hip: Secondary | ICD-10-CM | POA: Diagnosis not present

## 2024-02-23 MED ORDER — BUPIVACAINE HCL 0.25 % IJ SOLN
2.0000 mL | INTRAMUSCULAR | Status: AC | PRN
  Administered 2024-02-23: 2 mL via INTRA_ARTICULAR

## 2024-02-23 MED ORDER — LIDOCAINE HCL 1 % IJ SOLN
2.0000 mL | INTRAMUSCULAR | Status: AC | PRN
  Administered 2024-02-23: 2 mL

## 2024-02-23 MED ORDER — METHYLPREDNISOLONE ACETATE 40 MG/ML IJ SUSP
40.0000 mg | INTRAMUSCULAR | Status: AC | PRN
  Administered 2024-02-23: 40 mg via INTRA_ARTICULAR

## 2024-02-23 NOTE — Progress Notes (Signed)
 "  Office Visit Note   Patient: Stacie [name]           Date of Birth: 06-Sep-1955           MRN: [id number] Visit Date: 02/23/2024              Requested by: [name], NP [address] [place],  [place] [address] PCP: [name], NP   Assessment & Plan: Visit Diagnoses:  1. Trochanteric bursitis, left hip   2. Trochanteric bursitis, right hip     Plan: Impression is bilateral hip trochanteric bursitis.  Today, we discussed proceeding with trochanteric bursa cortisone injections.  I would like for her to restart her IT band exercises once her symptoms improve following the injections.  She will follow-up with us  as needed.  Call with concerns or questions.  Follow-Up Instructions: Return if symptoms worsen or fail to improve.   Orders:  Orders Placed This Encounter  Procedures   Large Joint Inj: bilateral greater trochanter   No orders of the defined types were placed in this encounter.     Procedures: Large Joint Inj: bilateral greater trochanter on 02/23/2024 10:36 AM Indications: pain Details: 22 G needle, lateral approach Medications (Right): 2 mL lidocaine  1 %; 2 mL bupivacaine  0.25 %; 40 mg methylPREDNISolone  acetate 40 MG/ML Medications (Left): 2 mL lidocaine  1 %; 2 mL bupivacaine  0.25 %; 40 mg methylPREDNISolone  acetate 40 MG/ML      Clinical Data: No additional findings.   Subjective: Chief Complaint  Patient presents with   Left Hip - Pain    HPI patient is a pleasant 69 year old female who comes in today with continued bilateral lateral hip pain.  She was seen by me a little over 2 months ago for this.  At that time, she opted to try IT band exercises as well as topical Voltaren.  Unfortunately, she has not noticed much relief.  She is here today to proceed with trochanteric bursa cortisone injections.  Review of Systems as detailed in HPI.  All others reviewed and are negative.   Objective: Vital Signs: There were  no vitals taken for this visit.  Physical Exam well-developed well-nourished female in no acute distress.  Alert and oriented x 3.  Ortho Exam bilateral hip exam: Moderate tenderness to greater trochanter.  She is neurovascularly intact distally.  Specialty Comments:  No specialty comments available.  Imaging: No new imaging   PMFS History: Patient Active Problem List   Diagnosis Date Noted   Chronic left hip pain 09/29/2023   Scalp lesion 09/29/2023   Macrocytic anemia 10/14/2022   History of squamous cell carcinoma excision 07/22/2022   Elevated TSH 10/09/2021   Vitamin D  deficiency 04/16/2021   History of hip fracture 04/16/2021   Aortic atherosclerosis 06/20/2019   Underweight 05/05/2019   Depression 05/05/2019   Former tobacco use 05/05/2019   Marijuana use 05/05/2019   History of alcohol use disorder 05/05/2019   Chronic diastolic CHF (congestive heart failure) (HCC) 05/05/2019   History of CVA with residual deficit 05/04/2019   Chronic diarrhea 08/26/2018   PAD (peripheral artery disease) 12/06/2017   Claudication of right lower extremity 12/06/2017   Hyperlipidemia 02/28/2016   Essential hypertension 02/21/2016   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 (CVA) 02/21/2016   Hearing loss 02/21/2016   Past Medical History:  Diagnosis Date   Acute metabolic encephalopathy 03/25/2021   Colitis    Dysphagia 03/25/2021   Hyperlipidemia    Hypertension  Protein-calorie malnutrition, severe 05/16/2019   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   Right Lower Leg Anterior- MOHS Completed on 08/17/22   SCC (squamous cell carcinoma), arm, right 07/09/2022   Stroke (HCC) 04/2012   unknown type   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   Thyroid  disease     Family History  Problem Relation Age of Onset   Stroke Mother 74       embolic   Heart disease Father 47       CAd with CABG, no MI   Heart disease Sister 68       CAD   Heart disease Paternal  Uncle    Heart disease Maternal Grandmother    Heart disease Paternal Grandfather    Colon cancer Neg Hx    Esophageal cancer Neg Hx    Stomach cancer Neg Hx    BRCA 1/2 Neg Hx    Breast cancer Neg Hx     Past Surgical History:  Procedure Laterality Date   FEMUR IM NAIL Right 03/20/2021   Procedure: INTRAMEDULLARY (IM) NAIL FEMORAL;  Surgeon: Josefina Chew, MD;  Location: [facility];  Service: Orthopedics;  Laterality: Right;   Social History   Occupational History   Occupation: retired  Tobacco Use   Smoking status: Former    Current packs/day: 0.00    Average packs/day: 0.3 packs/day for 10.0 years (2.5 ttl pk-yrs)    Types: Cigarettes    Start date: 05/2009    Quit date: 05/2019    Years since quitting: 4.8   Smokeless tobacco: Never  Vaping Use   Vaping status: Never Used  Substance and Sexual Activity   Alcohol use: Not Currently   Drug use: Yes    Frequency: 7.0 times per week    Types: Marijuana    Comment: to help with colitis- multiple times a day   Sexual activity: Not Currently        "

## 2024-03-31 ENCOUNTER — Ambulatory Visit: Admitting: Nurse Practitioner

## 2024-11-17 ENCOUNTER — Ambulatory Visit
